# Patient Record
Sex: Female | Born: 1945
Health system: Southern US, Community
[De-identification: ages and names within clinical notes are randomized; demographics above are authoritative.]

## PROBLEM LIST (undated history)

## (undated) DIAGNOSIS — Z853 Personal history of malignant neoplasm of breast: Secondary | ICD-10-CM

## (undated) DIAGNOSIS — Z9889 Other specified postprocedural states: Secondary | ICD-10-CM

## (undated) DIAGNOSIS — K449 Diaphragmatic hernia without obstruction or gangrene: Secondary | ICD-10-CM

## (undated) DIAGNOSIS — I509 Heart failure, unspecified: Secondary | ICD-10-CM

## (undated) DIAGNOSIS — I10 Essential (primary) hypertension: Secondary | ICD-10-CM

## (undated) DIAGNOSIS — E785 Hyperlipidemia, unspecified: Secondary | ICD-10-CM

## (undated) DIAGNOSIS — T386X5A Adverse effect of antigonadotrophins, antiestrogens, antiandrogens, not elsewhere classified, initial encounter: Principal | ICD-10-CM

## (undated) DIAGNOSIS — N951 Menopausal and female climacteric states: Secondary | ICD-10-CM

## (undated) DIAGNOSIS — M818 Other osteoporosis without current pathological fracture: Secondary | ICD-10-CM

## (undated) DIAGNOSIS — M1990 Unspecified osteoarthritis, unspecified site: Secondary | ICD-10-CM

## (undated) DIAGNOSIS — I219 Acute myocardial infarction, unspecified: Secondary | ICD-10-CM

## (undated) DIAGNOSIS — K219 Gastro-esophageal reflux disease without esophagitis: Secondary | ICD-10-CM

## (undated) DIAGNOSIS — Z820 Family history of epilepsy and other diseases of the nervous system: Secondary | ICD-10-CM

## (undated) DIAGNOSIS — Z923 Personal history of irradiation: Secondary | ICD-10-CM

## (undated) DIAGNOSIS — M8580 Other specified disorders of bone density and structure, unspecified site: Secondary | ICD-10-CM

## (undated) DIAGNOSIS — C50919 Malignant neoplasm of unspecified site of unspecified female breast: Secondary | ICD-10-CM

## (undated) DIAGNOSIS — T7840XA Allergy, unspecified, initial encounter: Secondary | ICD-10-CM

## (undated) DIAGNOSIS — Z952 Presence of prosthetic heart valve: Secondary | ICD-10-CM

## (undated) HISTORY — DX: Menopausal and female climacteric states: N95.1

## (undated) HISTORY — DX: Other specified disorders of bone density and structure, unspecified site: M85.80

## (undated) HISTORY — PX: HIATAL HERNIA REPAIR: SHX195

## (undated) HISTORY — DX: Hyperlipidemia, unspecified: E78.5

## (undated) HISTORY — DX: Diaphragmatic hernia without obstruction or gangrene: K44.9

## (undated) HISTORY — DX: Heart failure, unspecified: I50.9

## (undated) HISTORY — DX: Allergy, unspecified, initial encounter: T78.40XA

## (undated) HISTORY — DX: Acute myocardial infarction, unspecified: I21.9

## (undated) HISTORY — PX: BREAST BIOPSY: SHX20

## (undated) HISTORY — PX: ABDOMINAL HYSTERECTOMY: SHX81

## (undated) HISTORY — PX: CARDIAC CATHETERIZATION: SHX172

## (undated) HISTORY — DX: Other specified postprocedural states: Z98.890

## (undated) HISTORY — PX: CARDIAC VALVE REPLACEMENT: SHX585

## (undated) HISTORY — DX: Essential (primary) hypertension: I10

## (undated) HISTORY — PX: OTHER SURGICAL HISTORY: SHX169

## (undated) HISTORY — DX: Other osteoporosis without current pathological fracture: M81.8

## (undated) HISTORY — DX: Family history of epilepsy and other diseases of the nervous system: Z82.0

## (undated) HISTORY — DX: Unspecified osteoarthritis, unspecified site: M19.90

## (undated) HISTORY — PX: EYE SURGERY: SHX253

## (undated) HISTORY — DX: Personal history of malignant neoplasm of breast: Z85.3

## (undated) HISTORY — PX: HERNIA REPAIR: SHX51

## (undated) HISTORY — DX: Presence of prosthetic heart valve: Z95.2

## (undated) HISTORY — DX: Adverse effect of antigonadotrophins, antiestrogens, antiandrogens, not elsewhere classified, initial encounter: T38.6X5A

## (undated) HISTORY — DX: Gastro-esophageal reflux disease without esophagitis: K21.9

---

## 1997-08-15 ENCOUNTER — Ambulatory Visit: Admission: RE | Admit: 1997-08-15 | Discharge: 1997-08-15 | Payer: Self-pay | Admitting: Internal Medicine

## 1998-05-07 ENCOUNTER — Other Ambulatory Visit: Admission: RE | Admit: 1998-05-07 | Discharge: 1998-05-07 | Payer: Self-pay | Admitting: Gynecology

## 1999-03-08 DIAGNOSIS — C541 Malignant neoplasm of endometrium: Secondary | ICD-10-CM | POA: Insufficient documentation

## 1999-03-08 HISTORY — PX: OTHER SURGICAL HISTORY: SHX169

## 1999-05-17 ENCOUNTER — Other Ambulatory Visit: Admission: RE | Admit: 1999-05-17 | Discharge: 1999-05-17 | Payer: Self-pay | Admitting: Gynecology

## 1999-06-08 ENCOUNTER — Other Ambulatory Visit: Admission: RE | Admit: 1999-06-08 | Discharge: 1999-06-08 | Payer: Self-pay | Admitting: Gynecology

## 1999-06-08 ENCOUNTER — Encounter (INDEPENDENT_AMBULATORY_CARE_PROVIDER_SITE_OTHER): Payer: Self-pay | Admitting: Specialist

## 1999-07-02 ENCOUNTER — Encounter: Payer: Self-pay | Admitting: Gynecologic Oncology

## 1999-07-06 ENCOUNTER — Inpatient Hospital Stay (HOSPITAL_COMMUNITY): Admission: RE | Admit: 1999-07-06 | Discharge: 1999-07-09 | Payer: Self-pay | Admitting: Gynecologic Oncology

## 1999-07-06 ENCOUNTER — Encounter (INDEPENDENT_AMBULATORY_CARE_PROVIDER_SITE_OTHER): Payer: Self-pay

## 1999-09-30 ENCOUNTER — Other Ambulatory Visit: Admission: RE | Admit: 1999-09-30 | Discharge: 1999-09-30 | Payer: Self-pay | Admitting: Gynecology

## 2000-04-24 ENCOUNTER — Other Ambulatory Visit: Admission: RE | Admit: 2000-04-24 | Discharge: 2000-04-24 | Payer: Self-pay | Admitting: Gynecology

## 2000-05-10 ENCOUNTER — Encounter: Admission: RE | Admit: 2000-05-10 | Discharge: 2000-05-10 | Payer: Self-pay | Admitting: *Deleted

## 2000-05-10 ENCOUNTER — Encounter (INDEPENDENT_AMBULATORY_CARE_PROVIDER_SITE_OTHER): Payer: Self-pay | Admitting: *Deleted

## 2000-05-10 ENCOUNTER — Other Ambulatory Visit: Admission: RE | Admit: 2000-05-10 | Discharge: 2000-05-10 | Payer: Self-pay | Admitting: *Deleted

## 2000-05-10 ENCOUNTER — Encounter: Payer: Self-pay | Admitting: *Deleted

## 2000-07-17 ENCOUNTER — Other Ambulatory Visit: Admission: RE | Admit: 2000-07-17 | Discharge: 2000-07-17 | Payer: Self-pay | Admitting: Gynecology

## 2000-12-25 ENCOUNTER — Other Ambulatory Visit: Admission: RE | Admit: 2000-12-25 | Discharge: 2000-12-25 | Payer: Self-pay | Admitting: Gynecology

## 2001-03-07 HISTORY — PX: AORTIC VALVE REPLACEMENT: SHX41

## 2001-07-02 ENCOUNTER — Other Ambulatory Visit: Admission: RE | Admit: 2001-07-02 | Discharge: 2001-07-02 | Payer: Self-pay | Admitting: Gynecology

## 2001-12-13 ENCOUNTER — Ambulatory Visit (HOSPITAL_COMMUNITY): Admission: RE | Admit: 2001-12-13 | Discharge: 2001-12-13 | Payer: Self-pay | Admitting: Cardiovascular Disease

## 2001-12-13 ENCOUNTER — Encounter: Payer: Self-pay | Admitting: Cardiovascular Disease

## 2001-12-19 ENCOUNTER — Ambulatory Visit (HOSPITAL_COMMUNITY): Admission: RE | Admit: 2001-12-19 | Discharge: 2001-12-19 | Payer: Self-pay | Admitting: Cardiovascular Disease

## 2001-12-27 ENCOUNTER — Encounter: Payer: Self-pay | Admitting: Surgery

## 2001-12-31 ENCOUNTER — Inpatient Hospital Stay (HOSPITAL_COMMUNITY): Admission: RE | Admit: 2001-12-31 | Discharge: 2002-01-05 | Payer: Self-pay | Admitting: Surgery

## 2001-12-31 ENCOUNTER — Encounter (INDEPENDENT_AMBULATORY_CARE_PROVIDER_SITE_OTHER): Payer: Self-pay | Admitting: *Deleted

## 2001-12-31 ENCOUNTER — Encounter: Payer: Self-pay | Admitting: Surgery

## 2002-01-01 ENCOUNTER — Encounter: Payer: Self-pay | Admitting: Surgery

## 2002-01-02 ENCOUNTER — Encounter: Payer: Self-pay | Admitting: Surgery

## 2002-01-24 ENCOUNTER — Other Ambulatory Visit: Admission: RE | Admit: 2002-01-24 | Discharge: 2002-01-24 | Payer: Self-pay | Admitting: Gynecology

## 2002-07-22 ENCOUNTER — Other Ambulatory Visit: Admission: RE | Admit: 2002-07-22 | Discharge: 2002-07-22 | Payer: Self-pay | Admitting: Gynecology

## 2003-01-20 ENCOUNTER — Other Ambulatory Visit: Admission: RE | Admit: 2003-01-20 | Discharge: 2003-01-20 | Payer: Self-pay | Admitting: Gynecology

## 2003-03-08 DIAGNOSIS — C50919 Malignant neoplasm of unspecified site of unspecified female breast: Secondary | ICD-10-CM

## 2003-03-08 HISTORY — DX: Malignant neoplasm of unspecified site of unspecified female breast: C50.919

## 2003-03-08 HISTORY — PX: BREAST BIOPSY: SHX20

## 2003-03-08 HISTORY — PX: BREAST LUMPECTOMY: SHX2

## 2003-06-12 ENCOUNTER — Encounter (INDEPENDENT_AMBULATORY_CARE_PROVIDER_SITE_OTHER): Payer: Self-pay | Admitting: *Deleted

## 2003-06-12 ENCOUNTER — Encounter: Admission: RE | Admit: 2003-06-12 | Discharge: 2003-06-12 | Payer: Self-pay | Admitting: Surgery

## 2003-06-18 ENCOUNTER — Encounter (HOSPITAL_COMMUNITY): Admission: RE | Admit: 2003-06-18 | Discharge: 2003-09-16 | Payer: Self-pay | Admitting: Surgery

## 2003-07-01 ENCOUNTER — Inpatient Hospital Stay (HOSPITAL_COMMUNITY): Admission: AD | Admit: 2003-07-01 | Discharge: 2003-07-07 | Payer: Self-pay | Admitting: Cardiovascular Disease

## 2003-07-03 ENCOUNTER — Encounter (INDEPENDENT_AMBULATORY_CARE_PROVIDER_SITE_OTHER): Payer: Self-pay | Admitting: *Deleted

## 2003-07-03 ENCOUNTER — Encounter: Admission: RE | Admit: 2003-07-03 | Discharge: 2003-07-03 | Payer: Self-pay | Admitting: Surgery

## 2003-07-03 DIAGNOSIS — Z853 Personal history of malignant neoplasm of breast: Secondary | ICD-10-CM

## 2003-07-03 HISTORY — DX: Personal history of malignant neoplasm of breast: Z85.3

## 2003-07-03 HISTORY — PX: OTHER SURGICAL HISTORY: SHX169

## 2003-08-01 ENCOUNTER — Ambulatory Visit: Admission: RE | Admit: 2003-08-01 | Discharge: 2003-10-30 | Payer: Self-pay | Admitting: Radiation Oncology

## 2003-08-07 ENCOUNTER — Ambulatory Visit (HOSPITAL_COMMUNITY): Admission: RE | Admit: 2003-08-07 | Discharge: 2003-08-07 | Payer: Self-pay | Admitting: Radiation Oncology

## 2003-08-25 ENCOUNTER — Other Ambulatory Visit: Admission: RE | Admit: 2003-08-25 | Discharge: 2003-08-25 | Payer: Self-pay | Admitting: Gynecology

## 2004-01-26 ENCOUNTER — Other Ambulatory Visit: Admission: RE | Admit: 2004-01-26 | Discharge: 2004-01-26 | Payer: Self-pay | Admitting: Gynecology

## 2004-02-03 ENCOUNTER — Observation Stay (HOSPITAL_COMMUNITY): Admission: EM | Admit: 2004-02-03 | Discharge: 2004-02-04 | Payer: Self-pay | Admitting: Emergency Medicine

## 2004-02-12 ENCOUNTER — Ambulatory Visit: Payer: Self-pay | Admitting: Hematology & Oncology

## 2004-05-18 ENCOUNTER — Ambulatory Visit: Payer: Self-pay | Admitting: Internal Medicine

## 2004-06-11 ENCOUNTER — Ambulatory Visit: Payer: Self-pay | Admitting: Hematology & Oncology

## 2004-08-11 ENCOUNTER — Ambulatory Visit: Payer: Self-pay | Admitting: Family Medicine

## 2004-08-27 ENCOUNTER — Other Ambulatory Visit: Admission: RE | Admit: 2004-08-27 | Discharge: 2004-08-27 | Payer: Self-pay | Admitting: Gynecology

## 2004-10-08 ENCOUNTER — Ambulatory Visit: Payer: Self-pay | Admitting: Hematology & Oncology

## 2004-11-18 ENCOUNTER — Ambulatory Visit: Payer: Self-pay | Admitting: Internal Medicine

## 2004-12-17 ENCOUNTER — Ambulatory Visit: Payer: Self-pay | Admitting: Internal Medicine

## 2005-02-04 ENCOUNTER — Ambulatory Visit: Payer: Self-pay | Admitting: Hematology & Oncology

## 2005-04-19 ENCOUNTER — Ambulatory Visit: Payer: Self-pay | Admitting: Internal Medicine

## 2005-05-17 ENCOUNTER — Ambulatory Visit: Payer: Self-pay | Admitting: Internal Medicine

## 2005-06-07 ENCOUNTER — Ambulatory Visit: Payer: Self-pay | Admitting: Hematology & Oncology

## 2005-06-08 LAB — CBC WITH DIFFERENTIAL/PLATELET
BASO%: 0.6 % (ref 0.0–2.0)
Basophils Absolute: 0 10*3/uL (ref 0.0–0.1)
EOS%: 3 % (ref 0.0–7.0)
HGB: 13.9 g/dL (ref 11.6–15.9)
MCH: 28.7 pg (ref 26.0–34.0)
MONO#: 0.4 10*3/uL (ref 0.1–0.9)
RDW: 13 % (ref 11.3–14.5)
WBC: 4 10*3/uL (ref 3.9–10.0)
lymph#: 1.1 10*3/uL (ref 0.9–3.3)

## 2005-06-08 LAB — COMPREHENSIVE METABOLIC PANEL
ALT: 32 U/L (ref 0–40)
AST: 37 U/L (ref 0–37)
Albumin: 4.3 g/dL (ref 3.5–5.2)
BUN: 14 mg/dL (ref 6–23)
Calcium: 9.3 mg/dL (ref 8.4–10.5)
Chloride: 104 mEq/L (ref 96–112)
Potassium: 3.8 mEq/L (ref 3.5–5.3)

## 2005-08-25 ENCOUNTER — Ambulatory Visit: Payer: Self-pay | Admitting: Internal Medicine

## 2005-09-19 ENCOUNTER — Ambulatory Visit: Payer: Self-pay | Admitting: Internal Medicine

## 2005-12-12 ENCOUNTER — Ambulatory Visit: Payer: Self-pay | Admitting: Hematology & Oncology

## 2006-01-23 ENCOUNTER — Ambulatory Visit: Payer: Self-pay | Admitting: Internal Medicine

## 2006-01-23 LAB — CONVERTED CEMR LAB: Cholesterol: 154 mg/dL (ref 0–200)

## 2006-03-28 ENCOUNTER — Encounter (INDEPENDENT_AMBULATORY_CARE_PROVIDER_SITE_OTHER): Payer: Self-pay | Admitting: Specialist

## 2006-03-28 ENCOUNTER — Encounter: Admission: RE | Admit: 2006-03-28 | Discharge: 2006-03-28 | Payer: Self-pay | Admitting: Surgery

## 2006-06-06 ENCOUNTER — Encounter: Payer: Self-pay | Admitting: Internal Medicine

## 2006-06-21 ENCOUNTER — Ambulatory Visit: Payer: Self-pay | Admitting: Hematology & Oncology

## 2006-06-26 LAB — COMPREHENSIVE METABOLIC PANEL
ALT: 24 U/L (ref 0–35)
AST: 24 U/L (ref 0–37)
Albumin: 4.5 g/dL (ref 3.5–5.2)
Alkaline Phosphatase: 62 U/L (ref 39–117)
Glucose, Bld: 145 mg/dL — ABNORMAL HIGH (ref 70–99)
Potassium: 3.8 mEq/L (ref 3.5–5.3)
Sodium: 141 mEq/L (ref 135–145)
Total Bilirubin: 0.4 mg/dL (ref 0.3–1.2)
Total Protein: 6.9 g/dL (ref 6.0–8.3)

## 2006-06-26 LAB — CBC WITH DIFFERENTIAL/PLATELET
BASO%: 0.3 % (ref 0.0–2.0)
EOS%: 1.6 % (ref 0.0–7.0)
MCH: 29.1 pg (ref 26.0–34.0)
MCHC: 34.3 g/dL (ref 32.0–36.0)
RDW: 13.1 % (ref 11.3–14.5)
lymph#: 1.1 10*3/uL (ref 0.9–3.3)

## 2006-07-24 ENCOUNTER — Ambulatory Visit: Payer: Self-pay | Admitting: Internal Medicine

## 2006-07-25 ENCOUNTER — Encounter: Payer: Self-pay | Admitting: Internal Medicine

## 2006-07-25 DIAGNOSIS — E785 Hyperlipidemia, unspecified: Secondary | ICD-10-CM

## 2006-07-25 DIAGNOSIS — C549 Malignant neoplasm of corpus uteri, unspecified: Secondary | ICD-10-CM

## 2006-07-25 DIAGNOSIS — I10 Essential (primary) hypertension: Secondary | ICD-10-CM | POA: Insufficient documentation

## 2006-07-25 DIAGNOSIS — G43009 Migraine without aura, not intractable, without status migrainosus: Secondary | ICD-10-CM | POA: Insufficient documentation

## 2006-09-12 ENCOUNTER — Encounter: Admission: RE | Admit: 2006-09-12 | Discharge: 2006-09-12 | Payer: Self-pay | Admitting: Surgery

## 2006-12-05 ENCOUNTER — Encounter: Payer: Self-pay | Admitting: Internal Medicine

## 2006-12-20 ENCOUNTER — Ambulatory Visit: Payer: Self-pay | Admitting: Internal Medicine

## 2006-12-22 ENCOUNTER — Encounter: Admission: RE | Admit: 2006-12-22 | Discharge: 2006-12-22 | Payer: Self-pay | Admitting: Internal Medicine

## 2006-12-28 ENCOUNTER — Ambulatory Visit: Payer: Self-pay | Admitting: Hematology & Oncology

## 2007-01-01 ENCOUNTER — Encounter: Payer: Self-pay | Admitting: Internal Medicine

## 2007-01-01 LAB — CBC WITH DIFFERENTIAL/PLATELET
EOS%: 2.5 % (ref 0.0–7.0)
Eosinophils Absolute: 0.1 10*3/uL (ref 0.0–0.5)
LYMPH%: 22.3 % (ref 14.0–48.0)
MCH: 29.2 pg (ref 26.0–34.0)
MCV: 84.3 fL (ref 81.0–101.0)
MONO%: 6.8 % (ref 0.0–13.0)
NEUT#: 3.7 10*3/uL (ref 1.5–6.5)
Platelets: 290 10*3/uL (ref 145–400)
RBC: 4.87 10*6/uL (ref 3.70–5.32)

## 2007-01-01 LAB — COMPREHENSIVE METABOLIC PANEL
AST: 23 U/L (ref 0–37)
Alkaline Phosphatase: 57 U/L (ref 39–117)
BUN: 19 mg/dL (ref 6–23)
Glucose, Bld: 52 mg/dL — ABNORMAL LOW (ref 70–99)
Sodium: 142 mEq/L (ref 135–145)
Total Bilirubin: 0.3 mg/dL (ref 0.3–1.2)
Total Protein: 6.9 g/dL (ref 6.0–8.3)

## 2007-01-04 ENCOUNTER — Ambulatory Visit: Payer: Self-pay | Admitting: Internal Medicine

## 2007-01-06 LAB — HM COLONOSCOPY

## 2007-01-11 ENCOUNTER — Ambulatory Visit: Payer: Self-pay | Admitting: Internal Medicine

## 2007-01-11 ENCOUNTER — Encounter: Payer: Self-pay | Admitting: Internal Medicine

## 2007-01-12 ENCOUNTER — Ambulatory Visit (HOSPITAL_COMMUNITY): Admission: RE | Admit: 2007-01-12 | Discharge: 2007-01-12 | Payer: Self-pay | Admitting: Internal Medicine

## 2007-01-23 ENCOUNTER — Ambulatory Visit: Payer: Self-pay | Admitting: Internal Medicine

## 2007-01-23 DIAGNOSIS — K219 Gastro-esophageal reflux disease without esophagitis: Secondary | ICD-10-CM | POA: Insufficient documentation

## 2007-01-26 ENCOUNTER — Encounter: Payer: Self-pay | Admitting: Internal Medicine

## 2007-02-20 ENCOUNTER — Encounter: Payer: Self-pay | Admitting: Internal Medicine

## 2007-03-08 HISTORY — PX: OTHER SURGICAL HISTORY: SHX169

## 2007-03-12 ENCOUNTER — Ambulatory Visit (HOSPITAL_COMMUNITY): Admission: RE | Admit: 2007-03-12 | Discharge: 2007-03-12 | Payer: Self-pay | Admitting: Internal Medicine

## 2007-03-26 ENCOUNTER — Ambulatory Visit: Payer: Self-pay | Admitting: Internal Medicine

## 2007-03-30 ENCOUNTER — Encounter: Admission: RE | Admit: 2007-03-30 | Discharge: 2007-03-30 | Payer: Self-pay | Admitting: Surgery

## 2007-05-09 ENCOUNTER — Encounter (INDEPENDENT_AMBULATORY_CARE_PROVIDER_SITE_OTHER): Payer: Self-pay | Admitting: General Surgery

## 2007-05-09 ENCOUNTER — Inpatient Hospital Stay (HOSPITAL_COMMUNITY): Admission: RE | Admit: 2007-05-09 | Discharge: 2007-05-16 | Payer: Self-pay | Admitting: General Surgery

## 2007-05-09 DIAGNOSIS — M949 Disorder of cartilage, unspecified: Secondary | ICD-10-CM

## 2007-05-09 DIAGNOSIS — M899 Disorder of bone, unspecified: Secondary | ICD-10-CM | POA: Insufficient documentation

## 2007-05-09 DIAGNOSIS — M129 Arthropathy, unspecified: Secondary | ICD-10-CM | POA: Insufficient documentation

## 2007-05-09 DIAGNOSIS — K449 Diaphragmatic hernia without obstruction or gangrene: Secondary | ICD-10-CM | POA: Insufficient documentation

## 2007-05-09 DIAGNOSIS — J309 Allergic rhinitis, unspecified: Secondary | ICD-10-CM | POA: Insufficient documentation

## 2007-05-11 HISTORY — PX: HIATAL HERNIA REPAIR: SHX195

## 2007-06-19 ENCOUNTER — Encounter: Payer: Self-pay | Admitting: Internal Medicine

## 2007-07-02 ENCOUNTER — Ambulatory Visit: Payer: Self-pay | Admitting: Hematology & Oncology

## 2007-07-04 ENCOUNTER — Encounter: Payer: Self-pay | Admitting: Internal Medicine

## 2007-07-04 LAB — CBC WITH DIFFERENTIAL/PLATELET
Basophils Absolute: 0 10*3/uL (ref 0.0–0.1)
EOS%: 2.6 % (ref 0.0–7.0)
Eosinophils Absolute: 0.1 10*3/uL (ref 0.0–0.5)
HCT: 37.4 % (ref 34.8–46.6)
HGB: 12.8 g/dL (ref 11.6–15.9)
MONO#: 0.2 10*3/uL (ref 0.1–0.9)
NEUT#: 3.4 10*3/uL (ref 1.5–6.5)
NEUT%: 68.1 % (ref 39.6–76.8)
RDW: 13.9 % (ref 11.3–14.5)
WBC: 4.9 10*3/uL (ref 3.9–10.0)
lymph#: 1.2 10*3/uL (ref 0.9–3.3)

## 2007-07-04 LAB — COMPREHENSIVE METABOLIC PANEL
AST: 24 U/L (ref 0–37)
Albumin: 4.1 g/dL (ref 3.5–5.2)
BUN: 15 mg/dL (ref 6–23)
CO2: 29 mEq/L (ref 19–32)
Calcium: 9.3 mg/dL (ref 8.4–10.5)
Chloride: 102 mEq/L (ref 96–112)
Glucose, Bld: 107 mg/dL — ABNORMAL HIGH (ref 70–99)
Potassium: 3.6 mEq/L (ref 3.5–5.3)

## 2007-07-27 ENCOUNTER — Ambulatory Visit: Payer: Self-pay | Admitting: Internal Medicine

## 2007-08-16 ENCOUNTER — Encounter: Payer: Self-pay | Admitting: Internal Medicine

## 2007-08-22 ENCOUNTER — Encounter: Payer: Self-pay | Admitting: Internal Medicine

## 2007-08-27 ENCOUNTER — Telehealth: Payer: Self-pay | Admitting: Internal Medicine

## 2007-08-29 ENCOUNTER — Ambulatory Visit: Payer: Self-pay | Admitting: Gastroenterology

## 2007-08-29 DIAGNOSIS — R1084 Generalized abdominal pain: Secondary | ICD-10-CM

## 2007-08-29 DIAGNOSIS — R197 Diarrhea, unspecified: Secondary | ICD-10-CM

## 2007-08-30 ENCOUNTER — Telehealth (INDEPENDENT_AMBULATORY_CARE_PROVIDER_SITE_OTHER): Payer: Self-pay

## 2007-08-30 LAB — CONVERTED CEMR LAB
Albumin: 4.1 g/dL (ref 3.5–5.2)
Alkaline Phosphatase: 53 units/L (ref 39–117)
BUN: 10 mg/dL (ref 6–23)
Basophils Relative: 0 % (ref 0.0–1.0)
Creatinine, Ser: 0.8 mg/dL (ref 0.4–1.2)
Eosinophils Relative: 2.7 % (ref 0.0–5.0)
GFR calc non Af Amer: 78 mL/min
Glucose, Bld: 121 mg/dL — ABNORMAL HIGH (ref 70–99)
HCT: 39.2 % (ref 36.0–46.0)
Hemoglobin: 13.5 g/dL (ref 12.0–15.0)
MCV: 83.2 fL (ref 78.0–100.0)
Monocytes Absolute: 0.3 10*3/uL (ref 0.1–1.0)
Monocytes Relative: 3.8 % (ref 3.0–12.0)
Neutro Abs: 5.1 10*3/uL (ref 1.4–7.7)
Potassium: 3.4 meq/L — ABNORMAL LOW (ref 3.5–5.1)
RBC: 4.71 M/uL (ref 3.87–5.11)
WBC: 6.7 10*3/uL (ref 4.5–10.5)

## 2007-09-03 ENCOUNTER — Encounter: Payer: Self-pay | Admitting: Physician Assistant

## 2007-09-12 ENCOUNTER — Ambulatory Visit: Payer: Self-pay | Admitting: Internal Medicine

## 2007-09-12 DIAGNOSIS — R5381 Other malaise: Secondary | ICD-10-CM

## 2007-09-12 DIAGNOSIS — R5383 Other fatigue: Secondary | ICD-10-CM | POA: Insufficient documentation

## 2007-10-17 ENCOUNTER — Encounter: Payer: Self-pay | Admitting: Internal Medicine

## 2008-01-09 ENCOUNTER — Ambulatory Visit: Payer: Self-pay | Admitting: Hematology & Oncology

## 2008-01-10 ENCOUNTER — Encounter: Payer: Self-pay | Admitting: Internal Medicine

## 2008-01-10 LAB — CBC WITH DIFFERENTIAL (CANCER CENTER ONLY)
BASO%: 0.6 % (ref 0.0–2.0)
LYMPH%: 26.5 % (ref 14.0–48.0)
MCV: 85 fL (ref 81–101)
MONO#: 0.2 10*3/uL (ref 0.1–0.9)
Platelets: 266 10*3/uL (ref 145–400)
RDW: 11.6 % (ref 10.5–14.6)
WBC: 4.9 10*3/uL (ref 3.9–10.0)

## 2008-01-10 LAB — COMPREHENSIVE METABOLIC PANEL
BUN: 22 mg/dL (ref 6–23)
CO2: 27 mEq/L (ref 19–32)
Calcium: 10.1 mg/dL (ref 8.4–10.5)
Chloride: 102 mEq/L (ref 96–112)
Creatinine, Ser: 0.95 mg/dL (ref 0.40–1.20)
Total Bilirubin: 0.4 mg/dL (ref 0.3–1.2)

## 2008-01-28 ENCOUNTER — Ambulatory Visit: Payer: Self-pay | Admitting: Internal Medicine

## 2008-02-20 ENCOUNTER — Telehealth: Payer: Self-pay | Admitting: Internal Medicine

## 2008-03-03 ENCOUNTER — Encounter: Payer: Self-pay | Admitting: Internal Medicine

## 2008-03-06 ENCOUNTER — Telehealth: Payer: Self-pay | Admitting: Internal Medicine

## 2008-03-10 ENCOUNTER — Encounter: Payer: Self-pay | Admitting: Internal Medicine

## 2008-03-13 ENCOUNTER — Ambulatory Visit: Payer: Self-pay | Admitting: Internal Medicine

## 2008-03-13 DIAGNOSIS — L509 Urticaria, unspecified: Secondary | ICD-10-CM | POA: Insufficient documentation

## 2008-03-20 ENCOUNTER — Ambulatory Visit: Payer: Self-pay | Admitting: Gastroenterology

## 2008-03-20 ENCOUNTER — Telehealth: Payer: Self-pay | Admitting: Internal Medicine

## 2008-03-20 DIAGNOSIS — R197 Diarrhea, unspecified: Secondary | ICD-10-CM | POA: Insufficient documentation

## 2008-03-20 DIAGNOSIS — A09 Infectious gastroenteritis and colitis, unspecified: Secondary | ICD-10-CM | POA: Insufficient documentation

## 2008-03-20 LAB — CONVERTED CEMR LAB
Basophils Relative: 1 % (ref 0.0–3.0)
Eosinophils Relative: 2.4 % (ref 0.0–5.0)
HCT: 40.7 % (ref 36.0–46.0)
Monocytes Absolute: 0.3 10*3/uL (ref 0.1–1.0)
Monocytes Relative: 5.4 % (ref 3.0–12.0)
Neutrophils Relative %: 69.1 % (ref 43.0–77.0)
Platelets: 297 10*3/uL (ref 150–400)
RBC: 4.76 M/uL (ref 3.87–5.11)
WBC: 6.2 10*3/uL (ref 4.5–10.5)

## 2008-03-21 ENCOUNTER — Encounter: Payer: Self-pay | Admitting: Physician Assistant

## 2008-03-24 ENCOUNTER — Telehealth: Payer: Self-pay | Admitting: Physician Assistant

## 2008-04-09 ENCOUNTER — Encounter: Admission: RE | Admit: 2008-04-09 | Discharge: 2008-04-09 | Payer: Self-pay | Admitting: Gynecology

## 2008-04-18 ENCOUNTER — Encounter: Payer: Self-pay | Admitting: Internal Medicine

## 2008-05-26 ENCOUNTER — Ambulatory Visit: Payer: Self-pay | Admitting: Internal Medicine

## 2008-07-16 ENCOUNTER — Ambulatory Visit: Payer: Self-pay | Admitting: Hematology & Oncology

## 2008-07-17 ENCOUNTER — Encounter: Payer: Self-pay | Admitting: Internal Medicine

## 2008-07-17 LAB — COMPREHENSIVE METABOLIC PANEL
ALT: 25 U/L (ref 0–35)
AST: 26 U/L (ref 0–37)
Albumin: 4.3 g/dL (ref 3.5–5.2)
Alkaline Phosphatase: 59 U/L (ref 39–117)
BUN: 17 mg/dL (ref 6–23)
CO2: 27 mEq/L (ref 19–32)
Calcium: 9 mg/dL (ref 8.4–10.5)
Chloride: 103 mEq/L (ref 96–112)
Creatinine, Ser: 0.98 mg/dL (ref 0.40–1.20)
Glucose, Bld: 133 mg/dL — ABNORMAL HIGH (ref 70–99)
Potassium: 3.5 mEq/L (ref 3.5–5.3)
Sodium: 141 mEq/L (ref 135–145)
Total Bilirubin: 0.3 mg/dL (ref 0.3–1.2)
Total Protein: 6.9 g/dL (ref 6.0–8.3)

## 2008-07-17 LAB — CBC WITH DIFFERENTIAL (CANCER CENTER ONLY)
BASO%: 0.4 % (ref 0.0–2.0)
Eosinophils Absolute: 0.1 10*3/uL (ref 0.0–0.5)
LYMPH%: 26.2 % (ref 14.0–48.0)
MCH: 28.9 pg (ref 26.0–34.0)
MCHC: 34 g/dL (ref 32.0–36.0)
MCV: 85 fL (ref 81–101)
MONO%: 3.8 % (ref 0.0–13.0)
Platelets: 249 10*3/uL (ref 145–400)
RDW: 11.6 % (ref 10.5–14.6)

## 2008-11-24 ENCOUNTER — Ambulatory Visit: Payer: Self-pay | Admitting: Internal Medicine

## 2009-01-14 ENCOUNTER — Ambulatory Visit: Payer: Self-pay | Admitting: Hematology & Oncology

## 2009-01-15 ENCOUNTER — Encounter: Payer: Self-pay | Admitting: Internal Medicine

## 2009-01-15 LAB — CBC WITH DIFFERENTIAL (CANCER CENTER ONLY)
BASO#: 0 10*3/uL (ref 0.0–0.2)
Eosinophils Absolute: 0.1 10*3/uL (ref 0.0–0.5)
HGB: 14 g/dL (ref 11.6–15.9)
LYMPH%: 28.2 % (ref 14.0–48.0)
MCH: 28.6 pg (ref 26.0–34.0)
MCV: 82 fL (ref 81–101)
MONO%: 6.3 % (ref 0.0–13.0)
NEUT%: 62.8 % (ref 39.6–80.0)
RBC: 4.9 10*6/uL (ref 3.70–5.32)

## 2009-01-16 LAB — COMPREHENSIVE METABOLIC PANEL
ALT: 27 U/L (ref 0–35)
AST: 27 U/L (ref 0–37)
Albumin: 4.4 g/dL (ref 3.5–5.2)
BUN: 23 mg/dL (ref 6–23)
CO2: 29 mEq/L (ref 19–32)
Calcium: 9.4 mg/dL (ref 8.4–10.5)
Chloride: 106 mEq/L (ref 96–112)
Creatinine, Ser: 0.9 mg/dL (ref 0.40–1.20)
Potassium: 4.5 mEq/L (ref 3.5–5.3)

## 2009-01-16 LAB — VITAMIN D 25 HYDROXY (VIT D DEFICIENCY, FRACTURES): Vit D, 25-Hydroxy: 40 ng/mL (ref 30–89)

## 2009-04-13 ENCOUNTER — Encounter: Admission: RE | Admit: 2009-04-13 | Discharge: 2009-04-13 | Payer: Self-pay | Admitting: Gynecology

## 2009-04-15 ENCOUNTER — Telehealth: Payer: Self-pay | Admitting: Internal Medicine

## 2009-05-11 ENCOUNTER — Encounter: Payer: Self-pay | Admitting: Internal Medicine

## 2009-05-18 ENCOUNTER — Ambulatory Visit: Payer: Self-pay | Admitting: Internal Medicine

## 2009-05-18 LAB — CONVERTED CEMR LAB
AST: 25 units/L (ref 0–37)
BUN: 17 mg/dL (ref 6–23)
Basophils Absolute: 0 10*3/uL (ref 0.0–0.1)
Bilirubin Urine: NEGATIVE
Blood in Urine, dipstick: NEGATIVE
Calcium: 9.8 mg/dL (ref 8.4–10.5)
Cholesterol: 161 mg/dL (ref 0–200)
Creatinine, Ser: 0.8 mg/dL (ref 0.4–1.2)
GFR calc non Af Amer: 77.18 mL/min (ref >60)
Glucose, Bld: 97 mg/dL (ref 70–99)
Glucose, Urine, Semiquant: NEGATIVE
HDL: 59 mg/dL (ref >39.00)
Lymphocytes Relative: 25 % (ref 12.0–46.0)
Lymphs Abs: 1.3 10*3/uL (ref 0.7–4.0)
Monocytes Relative: 8.7 % (ref 3.0–12.0)
Platelets: 251 10*3/uL (ref 150.0–400.0)
Protein, U semiquant: NEGATIVE
RDW: 12.4 % (ref 11.5–14.6)
TSH: 2.39 microintl units/mL (ref 0.35–5.50)
Total Bilirubin: 0.5 mg/dL (ref 0.3–1.2)
VLDL: 25 mg/dL (ref 0.0–40.0)
pH: 7

## 2009-05-25 ENCOUNTER — Ambulatory Visit: Payer: Self-pay | Admitting: Internal Medicine

## 2009-07-16 ENCOUNTER — Ambulatory Visit: Payer: Self-pay | Admitting: Hematology & Oncology

## 2009-07-20 ENCOUNTER — Encounter: Payer: Self-pay | Admitting: Internal Medicine

## 2009-07-20 LAB — CBC WITH DIFFERENTIAL (CANCER CENTER ONLY)
BASO#: 0 10*3/uL (ref 0.0–0.2)
HCT: 39.8 % (ref 34.8–46.6)
HGB: 13.6 g/dL (ref 11.6–15.9)
LYMPH#: 1.5 10*3/uL (ref 0.9–3.3)
MONO#: 0.3 10*3/uL (ref 0.1–0.9)
NEUT#: 4.2 10*3/uL (ref 1.5–6.5)
NEUT%: 70.1 % (ref 39.6–80.0)
RBC: 4.69 10*6/uL (ref 3.70–5.32)
WBC: 6 10*3/uL (ref 3.9–10.0)

## 2009-07-21 LAB — COMPREHENSIVE METABOLIC PANEL
ALT: 24 U/L (ref 0–35)
Albumin: 4.4 g/dL (ref 3.5–5.2)
BUN: 18 mg/dL (ref 6–23)
CO2: 29 mEq/L (ref 19–32)
Calcium: 9.6 mg/dL (ref 8.4–10.5)
Chloride: 102 mEq/L (ref 96–112)
Creatinine, Ser: 0.88 mg/dL (ref 0.40–1.20)
Potassium: 3.4 mEq/L — ABNORMAL LOW (ref 3.5–5.3)

## 2009-07-21 LAB — VITAMIN D 25 HYDROXY (VIT D DEFICIENCY, FRACTURES): Vit D, 25-Hydroxy: 44 ng/mL (ref 30–89)

## 2009-08-21 ENCOUNTER — Ambulatory Visit: Payer: Self-pay | Admitting: Hematology & Oncology

## 2009-08-24 LAB — COMPREHENSIVE METABOLIC PANEL
BUN: 16 mg/dL (ref 6–23)
CO2: 25 mEq/L (ref 19–32)
Calcium: 9.8 mg/dL (ref 8.4–10.5)
Chloride: 105 mEq/L (ref 96–112)
Creatinine, Ser: 0.76 mg/dL (ref 0.40–1.20)
Glucose, Bld: 124 mg/dL — ABNORMAL HIGH (ref 70–99)

## 2009-08-24 LAB — CBC WITH DIFFERENTIAL (CANCER CENTER ONLY)
BASO#: 0 10*3/uL (ref 0.0–0.2)
EOS%: 3.2 % (ref 0.0–7.0)
Eosinophils Absolute: 0.2 10*3/uL (ref 0.0–0.5)
HCT: 43.1 % (ref 34.8–46.6)
HGB: 14.6 g/dL (ref 11.6–15.9)
LYMPH#: 1.7 10*3/uL (ref 0.9–3.3)
MCHC: 34 g/dL (ref 32.0–36.0)
MONO#: 0.4 10*3/uL (ref 0.1–0.9)
NEUT%: 64.5 % (ref 39.6–80.0)

## 2009-10-29 ENCOUNTER — Ambulatory Visit: Payer: Self-pay | Admitting: Cardiovascular Disease

## 2009-11-24 ENCOUNTER — Ambulatory Visit (HOSPITAL_COMMUNITY): Admission: RE | Admit: 2009-11-24 | Discharge: 2009-11-24 | Payer: Self-pay | Admitting: Cardiovascular Disease

## 2009-11-24 ENCOUNTER — Ambulatory Visit: Payer: Self-pay | Admitting: Cardiovascular Disease

## 2009-11-24 ENCOUNTER — Encounter: Payer: Self-pay | Admitting: Internal Medicine

## 2009-11-30 ENCOUNTER — Ambulatory Visit: Payer: Self-pay | Admitting: Cardiovascular Disease

## 2009-11-30 ENCOUNTER — Encounter: Payer: Self-pay | Admitting: Internal Medicine

## 2009-12-29 ENCOUNTER — Ambulatory Visit: Payer: Self-pay | Admitting: Cardiovascular Disease

## 2010-01-15 ENCOUNTER — Ambulatory Visit: Payer: Self-pay | Admitting: Hematology & Oncology

## 2010-01-18 ENCOUNTER — Encounter: Payer: Self-pay | Admitting: Internal Medicine

## 2010-01-18 LAB — COMPREHENSIVE METABOLIC PANEL
ALT: 28 U/L (ref 0–35)
AST: 29 U/L (ref 0–37)
Albumin: 4.4 g/dL (ref 3.5–5.2)
Alkaline Phosphatase: 51 U/L (ref 39–117)
BUN: 21 mg/dL (ref 6–23)
Calcium: 9.5 mg/dL (ref 8.4–10.5)
Chloride: 104 mEq/L (ref 96–112)
Potassium: 3.7 mEq/L (ref 3.5–5.3)
Sodium: 141 mEq/L (ref 135–145)
Total Protein: 6.6 g/dL (ref 6.0–8.3)

## 2010-01-18 LAB — CBC WITH DIFFERENTIAL (CANCER CENTER ONLY)
BASO#: 0 10*3/uL (ref 0.0–0.2)
BASO%: 0.5 % (ref 0.0–2.0)
EOS%: 1.6 % (ref 0.0–7.0)
HGB: 13.7 g/dL (ref 11.6–15.9)
LYMPH#: 1.5 10*3/uL (ref 0.9–3.3)
MCH: 28.5 pg (ref 26.0–34.0)
MCHC: 33.7 g/dL (ref 32.0–36.0)
MONO%: 5.8 % (ref 0.0–13.0)
NEUT#: 4.6 10*3/uL (ref 1.5–6.5)
Platelets: 285 10*3/uL (ref 145–400)
RBC: 4.8 10*6/uL (ref 3.70–5.32)

## 2010-01-26 ENCOUNTER — Ambulatory Visit: Payer: Self-pay | Admitting: Cardiology

## 2010-01-27 ENCOUNTER — Telehealth: Payer: Self-pay | Admitting: Internal Medicine

## 2010-02-22 ENCOUNTER — Ambulatory Visit: Payer: Self-pay | Admitting: Cardiovascular Disease

## 2010-03-24 ENCOUNTER — Ambulatory Visit: Payer: Self-pay | Admitting: Cardiology

## 2010-03-27 ENCOUNTER — Other Ambulatory Visit: Payer: Self-pay | Admitting: Surgery

## 2010-03-27 DIAGNOSIS — Z9889 Other specified postprocedural states: Secondary | ICD-10-CM

## 2010-03-28 ENCOUNTER — Encounter: Payer: Self-pay | Admitting: Surgery

## 2010-04-06 NOTE — Letter (Signed)
Summary: [facility]  [facility]   Imported By: Maryln Gottron 01/27/2010 10:19:15  _____________________________________________________________________  External Attachment:    Type:   Image     Comment:   External Document

## 2010-04-06 NOTE — Progress Notes (Signed)
Summary: refill crestor  Phone Note Refill Request Message from:  Fax from Pharmacy  Refills Requested: Medication #1:  CRESTOR 10  MG TABS (ROSUVASTATIN CALCIUM) Take 1 tablet by mouth once a day [facility] Fax---[phone]  Initial call taken by: Warnell Forester,  April 15, 2009 8:45 AM    Prescriptions: CRESTOR 10  MG TABS (ROSUVASTATIN CALCIUM) Take 1 tablet by mouth once a day  #90 x 6   Entered by:   Raechel Ache, RN   Authorized by:   Gordy Savers  MD   Signed by:   Raechel Ache, RN on 04/15/2009   Method used:   Faxed to ...       [facility]* (mail-order)             ,          Ph: [phone]       Fax: [phone]   RxID:   [id number]

## 2010-04-06 NOTE — Progress Notes (Signed)
Summary: refill omeprazole  Phone Note Refill Request Message from:  Fax from Pharmacy on January 27, 2010 11:48 AM  Refills Requested: Medication #1:  PRILOSEC 20 MG CPDR Take 1 capsule by mouth once a day [facility]   Method Requested: Fax to Local Pharmacy Initial call taken by: Duard Brady LPN,  January 27, 2010 11:50 AM    Prescriptions: PRILOSEC 20 MG CPDR (OMEPRAZOLE) Take 1 capsule by mouth once a day  #90 x 1   Entered by:   Duard Brady LPN   Authorized by:   Gordy Savers  MD   Signed by:   Duard Brady LPN on 91/47/8295   Method used:   Historical   RxID:   [id number]  faxed back to [facility] - will need rov soon. kik

## 2010-04-06 NOTE — Medication Information (Signed)
Summary: Coverage Approval for Omeprazole  Coverage Approval for Omeprazole   Imported By: Maryln Gottron 05/15/2009 09:44:47  _____________________________________________________________________  External Attachment:    Type:   Image     Comment:   External Document

## 2010-04-06 NOTE — Letter (Signed)
[facility]  [facility]   Imported By: Maryln Gottron 03/16/2009 13:17:45  _____________________________________________________________________  External Attachment:    Type:   Image     Comment:   External Document

## 2010-04-06 NOTE — Letter (Signed)
Summary: [facility]   Imported By: Maryln Gottron 12/11/2009 12:54:33  _____________________________________________________________________  External Attachment:    Type:   Image     Comment:   External Document

## 2010-04-06 NOTE — Letter (Signed)
[facility]   Imported By: Maryln Gottron 08/17/2009 14:54:14  _____________________________________________________________________  External Attachment:    Type:   Image     Comment:   External Document

## 2010-04-06 NOTE — Assessment & Plan Note (Signed)
Summary: CPX//LH   Vital Signs:  Patient profile:   65 year old female Height:      61.5 inches Weight:      142 pounds BMI:     26.49 Temp:     99.2 degrees F oral  Vitals Entered By: Duard Brady LPN (May 25, 2009 1:15 PM)  Primary Care Provider:  Eleonore Chiquito MD   History of Present Illness: 65 year-old patient who is seen today for a wellness exam.  she is followed by multiple specialists, including cardiology OB/GYN general surgery oncology.  She is doing quite well.  Two status post prosthetic aortic valve repair on chronic Coumadin.  She has a history of Gastrosoft reflux disease and is status post Nissan fundoplication.  She has hypertension and dyslipidemia, which have been stable.  No concerns or complaints.  She is scheduled for cardiology follow up later today.  Allergies: 1)  ! Demerol 2)  Sulfamethoxazole (Sulfamethoxazole)  Past History:  Past Medical History: Hyperlipidemia Hypertension Breast cancer, hx of GERD Osteopenia status post aortic valve repair migraine headaches menopausal syndrome  Past Surgical History: Lumpectomy breast cancer 2005 Aortic Valve Replacement: 2003 Hysterectomy,endometrial cancer 2001 Right shoulder surgery Nissan fundoplication 2009, post-op hematoma on heparin colonoscopy 11-08  Family History: Reviewed history from 09/12/2007 and no changes required. father status post aortic valve surgery mother status post MI, age 58  One brother, history of hypercholesterolemia, osteoarthritis one sister No FH of Colon Cancer  Social History: Reviewed history from 09/12/2007 and no changes required. married, Patient has never smoked.  Alcohol Use - socially Occupation: Retired Illicit Drug Use - no Patient gets regular exercise.  Review of Systems  The patient denies anorexia, fever, weight loss, weight gain, vision loss, decreased hearing, hoarseness, chest pain, syncope, dyspnea on exertion, peripheral  edema, prolonged cough, headaches, hemoptysis, abdominal pain, melena, hematochezia, severe indigestion/heartburn, hematuria, incontinence, genital sores, muscle weakness, suspicious skin lesions, transient blindness, difficulty walking, depression, unusual weight change, abnormal bleeding, enlarged lymph nodes, angioedema, and breast masses.    Physical Exam  General:  overweight-appearing.  116/78overweight-appearing.   Head:  Normocephalic and atraumatic without obvious abnormalities. No apparent alopecia or balding. Eyes:  No corneal or conjunctival inflammation noted. EOMI. Perrla. Funduscopic exam benign, without hemorrhages, exudates or papilledema. Vision grossly normal. Ears:  External ear exam shows no significant lesions or deformities.  Otoscopic examination reveals clear canals, tympanic membranes are intact bilaterally without bulging, retraction, inflammation or discharge. Hearing is grossly normal bilaterally. Nose:  External nasal examination shows no deformity or inflammation. Nasal mucosa are pink and moist without lesions or exudates. Mouth:  Oral mucosa and oropharynx without lesions or exudates.  Teeth in good repair. Neck:  No deformities, masses, or tenderness noted. Chest Wall:  No deformities, masses, or tenderness noted. Lungs:  Normal respiratory effort, chest expands symmetrically. Lungs are clear to auscultation, no crackles or wheezes. Heart:  grade 2/6 systolic murmur prosthetic heart sounds rhythm regular Abdomen:  Bowel sounds positive,abdomen soft and non-tender without masses, organomegaly or hernias noted. Msk:  No deformity or scoliosis noted of thoracic or lumbar spine.   Pulses:  R and L carotid,radial,femoral,dorsalis pedis and posterior tibial pulses are full and equal bilaterally Extremities:  No clubbing, cyanosis, edema, or deformity noted with normal full range of motion of all joints.   Neurologic:  alert & oriented X3, cranial nerves II-XII intact,  gait normal, and DTRs symmetrical and normal.  alert & oriented X3, cranial nerves II-XII intact, gait normal,  and DTRs symmetrical and normal.   Skin:  Intact without suspicious lesions or rashes Cervical Nodes:  No lymphadenopathy noted Psych:  Cognition and judgment appear intact. Alert and cooperative with normal attention span and concentration. No apparent delusions, illusions, hallucinations   Impression & Recommendations:  Problem # 1:  OSTEOPENIA (ICD-733.90)  Her updated medication list for this problem includes:    Fosamax 70 Mg Tabs (Alendronate sodium) .Marland Kitchen... 1 q week  Her updated medication list for this problem includes:    Fosamax 70 Mg Tabs (Alendronate sodium) .Marland Kitchen... 1 q week  Problem # 2:  GERD (ICD-530.81)  Her updated medication list for this problem includes:    Prilosec 20 Mg Cpdr (Omeprazole) .Marland Kitchen... Take 1 capsule by mouth once a day  Her updated medication list for this problem includes:    Prilosec 20 Mg Cpdr (Omeprazole) .Marland Kitchen... Take 1 capsule by mouth once a day  Problem # 3:  HYPERTENSION (ICD-401.9)  Her updated medication list for this problem includes:    Chlorthalidone 25 Mg Tabs (Chlorthalidone) .Marland Kitchen... Take 1 tablet by mouth once a day four days per week    Clonidine Hcl 0.1 Mg Tabs (Clonidine hcl) .Marland Kitchen... Take 1 tablet by mouth every morning    Verapamil Hcl Cr 180 Mg Tbcr (Verapamil hcl) .Marland Kitchen... 1 once daily  Her updated medication list for this problem includes:    Chlorthalidone 25 Mg Tabs (Chlorthalidone) .Marland Kitchen... Take 1 tablet by mouth once a day four days per week    Clonidine Hcl 0.1 Mg Tabs (Clonidine hcl) .Marland Kitchen... Take 1 tablet by mouth every morning    Verapamil Hcl Cr 180 Mg Tbcr (Verapamil hcl) .Marland Kitchen... 1 once daily  Problem # 4:  HYPERLIPIDEMIA (ICD-272.4)  Complete Medication List: 1)  Chlorthalidone 25 Mg Tabs (Chlorthalidone) .... Take 1 tablet by mouth once a day four days per week 2)  Clonidine Hcl 0.1 Mg Tabs (Clonidine hcl) .... Take 1  tablet by mouth every morning 3)  Crestor 10 Mg Tabs (rosuvastatin Calcium)  .... Take 1 tablet by mouth once a day 4)  Femara 2.5 Mg Tabs (Letrozole) .... Take 1 once a day 5)  Klor-con M20 20 Meq Tbcr (Potassium chloride crys cr) .... Take 1 tablet by mouth once a day four days per week 6)  Prilosec 20 Mg Cpdr (Omeprazole) .... Take 1 capsule by mouth once a day 7)  Senokot 8.6 Mg Tabs (Sennosides) .... Take 2 tablets by mouth once daily 8)  Restoril 30 Mg Caps (Temazepam) .Marland Kitchen.. 1 at bedtime 9)  Fosamax 70 Mg Tabs (Alendronate sodium) .Marland Kitchen.. 1 q week 10)  Warfarin Sodium 5 Mg Tabs (Warfarin sodium) .... As dir 11)  Verapamil Hcl Cr 180 Mg Tbcr (Verapamil hcl) .Marland Kitchen.. 1 once daily 12)  Imitrex 25 Mg Tabs (Sumatriptan succinate) .... One by mouth as needed 13)  Glucosamine-chondroitin Tabs (Glucosamine-chondroit-vit c-mn) .... Take 1 tablet by mouth once a day 14)  Calcium 600/vitamin D 600-400 Mg-unit Tabs (Calcium carbonate-vitamin d) .... Take 1 tablet by mouth two times a day 15)  Flax Seed Oil 1000 Mg Caps (Flaxseed (linseed)) .... Take 1 tablet by mouth once a day 16)  Multivitamins Tabs (Multiple vitamin) .... Take 1 tablet by mouth once a day 17)  Claritin 10 Mg Tabs (Loratadine) .... Take 1 tablet by mouth once a day 18)  Restasis 0.05 % Emul (Cyclosporine) .Marland Kitchen.. 1 drop into each eye 3 times daily  Patient Instructions: 1)  Please schedule a follow-up appointment  in 1 year. 2)  Limit your Sodium (Salt). 3)  It is important that you exercise regularly at least 20 minutes 5 times a week. If you develop chest pain, have severe difficulty breathing, or feel very tired , stop exercising immediately and seek medical attention. 4)  Take calcium +Vitamin D daily. 5)  Check your Blood Pressure regularly. If it is above:  150/90 you should make an appointment. Prescriptions: VERAPAMIL HCL CR 180 MG  TBCR (VERAPAMIL HCL) 1 once daily  #90 x 6   Entered and Authorized by:   Gordy Savers  MD    Signed by:   Gordy Savers  MD on 05/25/2009   Method used:   Print then Give to Patient   RxID:   [id number] WARFARIN SODIUM 5 MG  TABS (WARFARIN SODIUM) as dir  #90 x 0   Entered and Authorized by:   Gordy Savers  MD   Signed by:   Gordy Savers  MD on 05/25/2009   Method used:   Print then Give to Patient   RxID:   [id number] FOSAMAX 70 MG  TABS (ALENDRONATE SODIUM) 1 q week  #12 x 6   Entered and Authorized by:   Gordy Savers  MD   Signed by:   Gordy Savers  MD on 05/25/2009   Method used:   Print then Give to Patient   RxID:   [id number] RESTORIL 30 MG  CAPS (TEMAZEPAM) 1 at bedtime  #30 x 4   Entered and Authorized by:   Gordy Savers  MD   Signed by:   Gordy Savers  MD on 05/25/2009   Method used:   Print then Give to Patient   RxID:   [id number] PRILOSEC 20 MG CPDR (OMEPRAZOLE) Take 1 capsule by mouth once a day  #90 x 6   Entered and Authorized by:   Gordy Savers  MD   Signed by:   Gordy Savers  MD on 05/25/2009   Method used:   Print then Give to Patient   RxID:   [id number] KLOR-CON M20 20 MEQ TBCR (POTASSIUM CHLORIDE CRYS CR) Take 1 tablet by mouth once a day four days per week  #90 x 6   Entered and Authorized by:   Gordy Savers  MD   Signed by:   Gordy Savers  MD on 05/25/2009   Method used:   Print then Give to Patient   RxID:   [id number] FEMARA 2.5 MG TABS (LETROZOLE) Take 1 once a day  #90 x 6   Entered and Authorized by:   Gordy Savers  MD   Signed by:   Gordy Savers  MD on 05/25/2009   Method used:   Print then Give to Patient   RxID:   [id number] CRESTOR 10  MG TABS (ROSUVASTATIN CALCIUM) Take 1 tablet by mouth once a day  #90 x 6   Entered and Authorized by:   Gordy Savers  MD   Signed by:   Gordy Savers  MD on 05/25/2009   Method used:   Print then Give to Patient   RxID:   [id number] CLONIDINE HCL 0.1 MG  TABS (CLONIDINE HCL) Take 1 tablet by mouth every morning  #90 x 6   Entered and Authorized by:   Gordy Savers  MD   Signed by:   Gordy Savers  MD on 05/25/2009   Method used:  Print then Give to Patient   RxID:   [id number] CHLORTHALIDONE 25 MG TABS (CHLORTHALIDONE) Take 1 tablet by mouth once a day four days per week  #90 x 6   Entered and Authorized by:   Gordy Savers  MD   Signed by:   Gordy Savers  MD on 05/25/2009   Method used:   Print then Give to Patient   RxID:   [id number]  And a half of a will L. today and will is to call and we and per day, and 00 and a or a in her with a a and one in the low.  This is a the caffeine.  A with anion Chrissie Noa was less than

## 2010-04-06 NOTE — Letter (Signed)
Summary: [facility]   Imported By: Maryln Gottron 06/01/2009 09:59:55  _____________________________________________________________________  External Attachment:    Type:   Image     Comment:   External Document

## 2010-04-14 ENCOUNTER — Other Ambulatory Visit: Payer: Self-pay | Admitting: Internal Medicine

## 2010-04-14 DIAGNOSIS — I10 Essential (primary) hypertension: Secondary | ICD-10-CM

## 2010-04-19 ENCOUNTER — Encounter (INDEPENDENT_AMBULATORY_CARE_PROVIDER_SITE_OTHER): Payer: BC Managed Care – PPO

## 2010-04-19 ENCOUNTER — Ambulatory Visit
Admission: RE | Admit: 2010-04-19 | Discharge: 2010-04-19 | Disposition: A | Discharge status: Home / Self Care | Payer: BC Managed Care – PPO | Source: Ambulatory Visit | Attending: Surgery | Admitting: Surgery

## 2010-04-19 DIAGNOSIS — Z7901 Long term (current) use of anticoagulants: Secondary | ICD-10-CM

## 2010-04-19 DIAGNOSIS — I359 Nonrheumatic aortic valve disorder, unspecified: Secondary | ICD-10-CM

## 2010-04-19 DIAGNOSIS — Z9889 Other specified postprocedural states: Secondary | ICD-10-CM

## 2010-05-19 ENCOUNTER — Other Ambulatory Visit: Payer: BC Managed Care – PPO | Admitting: Internal Medicine

## 2010-05-19 DIAGNOSIS — Z0000 Encounter for general adult medical examination without abnormal findings: Secondary | ICD-10-CM

## 2010-05-19 LAB — HEPATIC FUNCTION PANEL
ALT: 26 U/L (ref 0–35)
AST: 29 U/L (ref 0–37)
Bilirubin, Direct: 0.1 mg/dL (ref 0.0–0.3)
Total Bilirubin: 0.5 mg/dL (ref 0.3–1.2)

## 2010-05-19 LAB — POCT URINALYSIS DIPSTICK
Blood, UA: NEGATIVE
Protein, UA: NEGATIVE
Spec Grav, UA: 1.02
Urobilinogen, UA: 0.2

## 2010-05-19 LAB — CBC WITH DIFFERENTIAL/PLATELET
Basophils Absolute: 0 10*3/uL (ref 0.0–0.1)
Eosinophils Relative: 2.2 % (ref 0.0–5.0)
HCT: 42.2 % (ref 36.0–46.0)
Lymphs Abs: 1.5 10*3/uL (ref 0.7–4.0)
Monocytes Relative: 8.2 % (ref 3.0–12.0)
Neutrophils Relative %: 63.2 % (ref 43.0–77.0)
Platelets: 277 10*3/uL (ref 150.0–400.0)
RDW: 13.3 % (ref 11.5–14.6)
WBC: 5.8 10*3/uL (ref 4.5–10.5)

## 2010-05-19 LAB — BASIC METABOLIC PANEL
BUN: 23 mg/dL (ref 6–23)
Calcium: 9.3 mg/dL (ref 8.4–10.5)
Creatinine, Ser: 0.9 mg/dL (ref 0.4–1.2)
GFR: 71.73 mL/min (ref >60.00)
Glucose, Bld: 98 mg/dL (ref 70–99)
Potassium: 3.8 mEq/L (ref 3.5–5.1)

## 2010-05-19 LAB — LIPID PANEL
Cholesterol: 190 mg/dL (ref 0–200)
LDL Cholesterol: 102 mg/dL — ABNORMAL HIGH (ref 0–99)
Total CHOL/HDL Ratio: 4
Triglycerides: 180 mg/dL — ABNORMAL HIGH (ref 0.0–149.0)
VLDL: 36 mg/dL (ref 0.0–40.0)

## 2010-05-26 ENCOUNTER — Encounter: Payer: Self-pay | Admitting: Internal Medicine

## 2010-05-26 ENCOUNTER — Encounter: Payer: BC Managed Care – PPO | Admitting: *Deleted

## 2010-05-28 ENCOUNTER — Other Ambulatory Visit: Payer: Self-pay | Admitting: Internal Medicine

## 2010-05-31 ENCOUNTER — Encounter: Payer: Self-pay | Admitting: Internal Medicine

## 2010-06-01 ENCOUNTER — Encounter: Payer: Self-pay | Admitting: Internal Medicine

## 2010-06-01 ENCOUNTER — Ambulatory Visit (INDEPENDENT_AMBULATORY_CARE_PROVIDER_SITE_OTHER): Payer: BC Managed Care – PPO | Admitting: *Deleted

## 2010-06-01 ENCOUNTER — Ambulatory Visit (INDEPENDENT_AMBULATORY_CARE_PROVIDER_SITE_OTHER): Payer: BC Managed Care – PPO | Admitting: Internal Medicine

## 2010-06-01 VITALS — BP 122/80 | HR 90 | Temp 98.4°F | Resp 16 | Ht 61.5 in | Wt 141.0 lb

## 2010-06-01 DIAGNOSIS — I359 Nonrheumatic aortic valve disorder, unspecified: Secondary | ICD-10-CM

## 2010-06-01 DIAGNOSIS — Z0000 Encounter for general adult medical examination without abnormal findings: Secondary | ICD-10-CM

## 2010-06-01 DIAGNOSIS — Z7901 Long term (current) use of anticoagulants: Secondary | ICD-10-CM

## 2010-06-01 MED ORDER — POTASSIUM CHLORIDE CRYS ER 20 MEQ PO TBCR: 20.0000 meq | EXTENDED_RELEASE_TABLET | Freq: Every day | ORAL | Status: DC

## 2010-06-01 MED ORDER — TEMAZEPAM 30 MG PO CAPS: 30.0000 mg | ORAL_CAPSULE | Freq: Every evening | ORAL | Status: DC | PRN

## 2010-06-01 MED ORDER — SUMATRIPTAN SUCCINATE 25 MG PO TABS: 25.0000 mg | ORAL_TABLET | Freq: Every day | ORAL | Status: DC | PRN

## 2010-06-01 NOTE — Progress Notes (Signed)
Subjective:    Patient ID: Amanda Wiggins, female    DOB: 02/05/1946, 65 y.o.   MRN: [id number]  HPI   A 65 year old patient who is seen today for a health maintenance examination. She is followed by multiple consultants including cardiology oncology OB/GYN general surgery and ENT. She is doing reasonably well. She is now  Approximately  8 years out from aortic valve replacement surgery.   Vitals Entered By: Duard Brady LPN (May 25, 2009 1:15 PM)  Primary Care Provider: Eleonore Chiquito MD  History of Present Illness:  65 year-old patient who is seen today for a wellness exam. she is followed by multiple specialists, including cardiology OB/GYN general surgery oncology. She is doing quite well. Two status post prosthetic aortic valve repair on chronic Coumadin. She has a history of Gastrosoft reflux disease and is status post Nissan fundoplication. She has hypertension and dyslipidemia, which have been stable. No concerns or complaints. She is scheduled for cardiology follow up later today.  Allergies:  1) ! Demerol  2) Sulfamethoxazole (Sulfamethoxazole)  Past History:  Past Medical History:  Hyperlipidemia  Hypertension  Breast cancer, hx of  GERD  Osteopenia  status post aortic valve repair  migraine headaches  menopausal syndrome  Past Surgical History:  Lumpectomy breast cancer 2005  Aortic Valve Replacement: 2003  Hysterectomy,endometrial cancer 2001  Right shoulder surgery  Nissan fundoplication 2009, post-op hematoma on heparin  colonoscopy 11-08  Family History:  Reviewed history from 09/12/2007 and no changes required.  father status post aortic valve surgery  mother status post MI, age 40  One brother, history of hypercholesterolemia, osteoarthritis  one sister  No FH of Colon Cancer  Social History:  Reviewed history from 09/12/2007 and no changes required.  married,  Patient has never smoked.  Alcohol Use - socially  Occupation: Retired  Illicit  Drug Use - no  Patient gets regular exercise.   Past Medical History  Diagnosis Date  . Hyperlipidemia   . Hypertension   . Hx of breast cancer   . GERD (gastroesophageal reflux disease)   . Osteopenia   . Status post aortic valve repair   . FHx: migraine headaches   . Menopausal syndrome    Past Surgical History  Procedure Date  . Lummpectomy breast cancer 2005  . Aortic valve replacement 2003  . Hysterectomy, endometiral cancer 2001  . Right shouler surgery   . Nissan fundoplicaiton 2009    post-op hematoma on heparin     reports that she has never smoked. She has never used smokeless tobacco. She reports that she drinks alcohol. She reports that she does not use illicit drugs. family history includes Heart attack (age of onset:59) in her mother and Osteoarthritis in her brother. Allergies  Allergen Reactions  . Meperidine Hcl   . Sulfamethoxazole     REACTION: unspecified    the in in in in a a   Review of Systems  Constitutional: Negative.  Negative for fever, appetite change, fatigue and unexpected weight change.  HENT: Negative for hearing loss, ear pain, nosebleeds, congestion, sore throat, rhinorrhea, mouth sores, trouble swallowing, neck stiffness, dental problem, voice change, sinus pressure and tinnitus.   Eyes: Negative for photophobia, pain, discharge, redness and visual disturbance.  Respiratory: Negative for cough, chest tightness and shortness of breath.   Cardiovascular: Negative for chest pain, palpitations and leg swelling.  Gastrointestinal: Negative for nausea, vomiting, abdominal pain, diarrhea, constipation, blood in stool, abdominal distention and rectal pain.  Genitourinary: Negative for  dysuria, urgency, frequency, hematuria, flank pain, vaginal bleeding, vaginal discharge, difficulty urinating, genital sores, vaginal pain, menstrual problem and pelvic pain.  Musculoskeletal: Negative for back pain, joint swelling, arthralgias and gait problem.    Skin: Negative for rash.  Neurological: Negative for dizziness, syncope, speech difficulty, weakness, light-headedness, numbness and headaches.  Hematological: Negative for adenopathy. Does not bruise/bleed easily.  Psychiatric/Behavioral: Negative for suicidal ideas, behavioral problems, self-injury, dysphoric mood and agitation. The patient is not nervous/anxious.        Objective:   Physical Exam  Constitutional: She is oriented to person, place, and time. She appears well-developed and well-nourished.  HENT:  Head: Normocephalic and atraumatic.  Right Ear: External ear normal.  Left Ear: External ear normal.  Mouth/Throat: Oropharynx is clear and moist.  Eyes: Conjunctivae and EOM are normal.  Neck: Normal range of motion. Neck supple. No JVD present. No thyromegaly present.  Cardiovascular: Normal rate, regular rhythm and intact distal pulses.   No murmur heard.       Prosthetic heart sounds. Sternotomy scar. Grade 1/6 systolic murmur  Pulmonary/Chest: Effort normal and breath sounds normal. She has no wheezes. She has no rales.  Abdominal: Soft. Bowel sounds are normal. She exhibits no distension and no mass. There is no tenderness. There is no rebound and no guarding.  Genitourinary: Vagina normal.  Musculoskeletal: Normal range of motion. She exhibits no edema and no tenderness.  Neurological: She is alert and oriented to person, place, and time. She has normal reflexes. No cranial nerve deficit. She exhibits normal muscle tone. Coordination normal.  Skin: Skin is warm and dry. No rash noted.  Psychiatric: She has a normal mood and affect. Her behavior is normal.          Assessment & Plan:   annual clinical examination. We'll continue follow up with cardiology oncology OB/GYN. A mammogram was performed last month. She is for redo colonoscopy in approximately 7 years. Laboratory studies were reviewed

## 2010-06-01 NOTE — Patient Instructions (Signed)
Limit your sodium (Salt) intake    It is important that you exercise regularly, at least 20 minutes 3 to 4 times per week.  If you develop chest pain or shortness of breath seek  medical attention.  Avoids foods high in acid such as tomatoes citrus juices, and spicy foods.  Avoid eating within two hours of lying down or before exercising.  Do not overheat.  Try smaller more frequent meals.  If symptoms persist, elevate the head of her bed 12 inches while sleeping.  Take a calcium supplement, plus [phone] units of vitamin D  Return in one year for follow-up

## 2010-06-16 ENCOUNTER — Other Ambulatory Visit: Payer: Self-pay | Admitting: Internal Medicine

## 2010-06-25 ENCOUNTER — Ambulatory Visit (INDEPENDENT_AMBULATORY_CARE_PROVIDER_SITE_OTHER): Payer: BC Managed Care – PPO | Admitting: *Deleted

## 2010-06-25 DIAGNOSIS — Z7901 Long term (current) use of anticoagulants: Secondary | ICD-10-CM

## 2010-06-25 DIAGNOSIS — Z954 Presence of other heart-valve replacement: Secondary | ICD-10-CM

## 2010-06-25 DIAGNOSIS — I359 Nonrheumatic aortic valve disorder, unspecified: Secondary | ICD-10-CM

## 2010-07-05 ENCOUNTER — Encounter: Payer: BC Managed Care – PPO | Admitting: *Deleted

## 2010-07-06 ENCOUNTER — Encounter: Payer: BC Managed Care – PPO | Admitting: *Deleted

## 2010-07-12 ENCOUNTER — Other Ambulatory Visit: Payer: Self-pay | Admitting: Internal Medicine

## 2010-07-20 NOTE — H&P (Signed)
Amanda Wiggins, Amanda Wiggins               ACCOUNT NO.:  [id number]   MEDICAL RECORD NO.:  [id number]          PATIENT TYPE:  OIB   LOCATION:  1444                         FACILITY:  [facility]   PHYSICIAN:  Adolph Pollack, M.D.DATE OF BIRTH:  15-Jul-1945   DATE OF ADMISSION:  05/09/2007  DATE OF DISCHARGE:                              HISTORY & PHYSICAL   REASON FOR ADMISSION:  Repair of large symptomatic hiatal hernia.   HISTORY OF PRESENT ILLNESS:  This is a 65 year old female who is having  problems with pressure-type substernal chest discomfort, specifically  after eating.  She has had cardiac causes ruled out and was noted to  have a large hiatal hernia on a chest x-ray.  She was sent to Dr. Stan Head who did upper GI demonstrating a large hiatal hernia with at  least 1/3 of the stomach into the chest.  No lesions on upper endoscopy.  She has no dysphasia.  Manometry looked relatively normal.  She now  presents for laparoscopic hiatal hernia repair and Nissen  fundoplication.   PAST MEDICAL HISTORY:  1. Bicuspid aortic valve status post replacement.  2. Chronic anticoagulated state.  3. Left breast cancer.  4. Endometrial cancer.  5. Migraine headaches.  6. Dyslipidemia.  7. Osteoporosis.  8. Right shoulder injury.   PREVIOUS OPERATIONS:  1. Hysterectomy.  2. Aortic valve replacement with mechanical valve.  3. Breast lumpectomy.  4. Repair of left shoulder injury.   ALLERGIES:  1. SULFA.  2. DEMEROL.  3. PERCOCET.   CURRENT MEDICATIONS INCLUDE:  Verapamil for migraines, Chlorthalidone,  Prilosec, Crestor, Femara daily, clonidine p.r.n. hot flashes, Coumadin,  Restoril, glucosamine, calcium with D, potassium chloride, flax seed  oil, multivitamins, Claritin, stool softener, Restasis, Imitrex p.r.n.  migraines, Darvocet p.r.n. pain, Reglan p.r.n., Fosamax every Sunday.   SOCIAL HISTORY:  She is married and a retired Engineer, site.  Social  alcohol user but no tobacco  use.   FAMILY HISTORY:  Is notable for:  1. Aortic valvular disease.  2. As well as coronary artery disease.  3. Hypertension.  4. Diabetes.  5. Hypercholesterolemia.  6. Prostate cancer.   PHYSICAL EXAM:  GENERALLY: Well-developed, well-nourished female.  She  is no acute distress and pleasant and cooperative.  EYES:  Extraocular motions intact.  There is no icterus.  NECK:  Is supple without masses or thyroid enlargement.  CHEST:  Well-healed midsternal scar.  Breath sounds equal and clear.  CARDIOVASCULAR: Regular rate, regular rhythm, with crisp valve click  noted.  ABDOMEN:  Is soft, nontender, nondistended.  There is a well-healed  lower midline scar without evidence of hernia.  MUSCULOSKELETAL:  SCDs on her legs.  Good range of motion.  SKIN:  No jaundice.   IMPRESSION:  Symptomatic large hiatal hernia.   PLAN:  We have had Dr. Elease Hashimoto bridge her anticoagulation and she has had  a Lovenox shot. Will proceed wit a laparoscopic hiatal hernia repair.  Postoperatively, will try to wait at least 24 hours before starting  heparin and have Dr. Elease Hashimoto assist [place] with her anticoagulation while in  the hospital.  Adolph Pollack, M.D.  Electronically Signed     TJR/MEDQ  D:  05/09/2007  T:  05/09/2007  Job:  [id number]

## 2010-07-20 NOTE — Assessment & Plan Note (Signed)
[facility]                         GASTROENTEROLOGY OFFICE NOTE   NAME:Amanda Wiggins, Amanda Wiggins                      MRN:          [id number]  DATE:12/20/2006                            DOB:          02-05-1946    REFERRING PHYSICIAN:  [name], M.D.   CHIEF COMPLAINT:  Chest pain, hiatal hernia.   PRIMARY CARE PHYSICIAN:  Gordy Savers, M.D.   ASSESSMENT:  Intermittent chest pain, perhaps some dysphagia but  probably just chest pain problems which I suspect are related to her  large hiatal hernia.  Numerous physicians have told her based upon chest  x-ray that she has a large hiatus hernia.  I think this is probably  symptomatic and may need surgery.  She takes Coumadin for an aortic  valve.  She would be at high risk of endoscopic investigations, but that  is certainly not insurmountable.   PLAN:  1. Upper GI series.  2. Will probably need an upper GI endoscopy.  3. I think it would be reasonable for her to have a screening optical      colonoscopy, as she has had just a barium enema in the past (though      that is not imperative at this time).  4. Will work with Dr. Elease Hashimoto regarding Coumadin therapy.  Aortic      valves, from my understanding, are typically a lower risk valve,      though if I am not going to dilate, I can perform an upper      endoscopy while she remains on her Coumadin and still take      biopsies.   HISTORY:  This 65 year old white woman for 2 years has had problems with  mid-esophageal or mid-sternal and lower sternal pain intermittently,  often while eating.  She often feels full in her esophagus but does not  really describe impact dysphagia.  A few weeks ago she had a very  disabling spell where she was ill for 2 hours.  It usually spontaneously  remits.  She does not seem to vomit or regurgitate.  She has not lost  weight.  She had been on Actonel for 2 years and stopped that for  several months at one  point, but that did not make a difference.  She is  on a PPI, i.e., Prilosec 20 mg every morning.  That and other PPIs have  not made a difference either.  It is an episodic problem, but it has  caused quite a bit of distress when it occurs.   PAST MEDICAL HISTORY:  1. Dyslipidemia.  2. Right shoulder and elbow trauma from fall.  3. Endometrial and breast cancer.  4. Breast lumpectomy with subsequently irradiation in 2005.  Dr.      Jamey Ripa was her surgery.  5. Hysterectomy for endometrial cancer in May of 2001.  6. Aortic valve replacement for a congenital bicuspid valve which runs      in her family, October of 2003, Dr. Laneta Simmers.  7. Migraine headaches.  8. Osteoporosis.   MEDICATIONS:  Listed and reviewed in the chart, but are:  1. Verapamil 180 mg daily.  2. Chlorthalidone 25 mg every other day.  3. Coumadin per schedule.  4. Prilosec 20 mg every morning.  5. Crestor 10 mg every morning.  6. Glucosamine chondroitin each morning.  7. Calcium 600 plus D twice a day.  8. Potassium 20 mEq every other day.  9. Flax seed oil daily.  10.Multivitamin daily.  11.Claritin OTC daily.  12.Stool softener, 2 every evening.  13.Femara 2.5 mg daily.  14.Clonidine 0.1 mg daily.  15.Restasis eye drops b.i.d.  16.Actonel weekly.  17.Restoril p.r.n.  18.Imitrex p.r.n.  19.Darvocet p.r.n.  20.Reglan p.r.n.   ALLERGIES:  SULFA CAUSES NAUSEA.   FAMILY HISTORY:  Bicuspid aortic valve is noted in several family  members.  Coronary disease in her mother.  Prostate cancer in her dad.  No colon cancer reported.   SOCIAL HISTORY:  She is married.  She is a retired Freight forwarder.  She lives with her husband, no children.  She has a  Manufacturing engineer and other post-graduate education.  Social alcohol only.   REVIEW OF SYSTEMS:  See medical history form otherwise.   PHYSICAL EXAMINATION:  GENERAL:  Well-developed, well-nourished white  woman.  VITAL SIGNS:  Height 5  feet 1 inches, weight 140 pounds (she does [employer]).  Blood pressure 138/64, pulse 68.  HEENT:  Eyes:  Anicteric.  ENT:  Normal mouth, throat, esophagus.  NECK:  Supple without thyromegaly.  CHEST:  Clear.  HEART:  S1 and S2.  No rubs or gallops.  There is a metallic S2, perhaps  a faint systolic murmur heard.  ABDOMEN:  Multiple scars but is soft and nontender without organomegaly  or mass.  RECTAL:  Not performed today.  LYMPHATICS:  No neck or supraclavicular nodes.  EXTREMITIES:  No edema.  SKIN:  No rash.  PSYCHIATRIC:  Alert and oriented x3.  NEUROLOGIC:  Grossly nonfocal.   I have reviewed the office notes from Dr. Elease Hashimoto.  Her INR was 3.3 at  that time.  She does relate that when she was holding her Coumadin prior  to her breast surgery, she had to go on heparin because her INR became  normal at one point.  If we needed to do anything like that, perhaps a  Lovenox window would work better for her, but we will sort through it.   I have requested copies of the CT scan she had recently at [facility].     Iva Boop, MD,FACG  Electronically Signed    CEG/MedQ  DD: 12/20/2006  DT: 12/21/2006  Job #: [id number]   cc:   [name], M.D.  Gordy Savers, MD

## 2010-07-20 NOTE — Consult Note (Signed)
NAMECRESTA, RIDEN               ACCOUNT NO.:  [id number]   MEDICAL RECORD NO.:  [id number]          PATIENT TYPE:  INP   LOCATION:  1444                         FACILITY:  [facility]   PHYSICIAN:  [name], M.D. DATE OF BIRTH:  04/13/1945   DATE OF CONSULTATION:  05/11/2007  DATE OF DISCHARGE:                                 CONSULTATION   HISTORY:  Amanda Wiggins is a 65 year old female with a history of aortic  valve replacement.  She developed a large hiatal hernia and was referred  for further evaluation.  She was seen preoperatively and was cleared for  surgery.   She had her surgery on May 09, 2007.  This morning she developed an  expanding ecchymotic area and also some tachycardia.  Her hemoglobin has  also dropped and it was thought that she had had some bleeding.  We are  asked to see her today for her episodes of tachycardia and to further  comment on her anticoagulation.   [name] has done well with her aortic valve.  She has been therapeutic on  her Coumadin and has not had any complications.  She has had to stop her  Coumadin several times for various tests and she has not had any  complications with that.  She stopped her Coumadin 4 days prior to her  surgery.  We bridged her with Lovenox for a day and she did not have any  complications.   She had a very large repair of a hiatal hernia.  This morning she was  found to have a heart rate in the 115-120 range.  She was found to have  an expanding ecchymosis below her left breast.  A CT revealed no  significant bleeding and no clear-cut evidence of leaking.  She had a  swallowing study yesterday which also revealed no evidence of a bowel  leak.  She has had considerable pain especially in her shoulders.  This  apparently is getting better with PCA pump.   HOME MEDICATIONS:  1. Her home medications include verapamil slow release 180 mg a day.  2. Chlorthalidone 25 mg a day.  3. Omeprazole 20 mg a day.  4. Crestor  10 mg a day.  5. Femara 2.5 mg a night.  6. Clonidine 0.1 mg a day.  7. Coumadin.  8. Potassium chloride 20 mEq a day.  Her current medications do not include verapamil or Coumadin.   PAST MEDICAL HISTORY:  1. AVR.  She has a mechanical aortic valve placed by Dr. Laneta Simmers.  2. Hyperlipidemia.  3. Hypertension.   SOCIAL HISTORY:  The patient is a nonsmoker and does not drink alcohol.   FAMILY HISTORY:  Is noncontributory.   REVIEW OF SYSTEMS:  Is unremarkable.   PHYSICAL EXAMINATION:  GENERAL:  She is a middle-aged female in no acute  distress.  She is alert and oriented x3 and her mood and affect are  normal.  VITAL SIGNS:  Blood pressure is 150/79 with heart rate of 115.  HEENT:  Reveals 2+ carotids.  She has no bruits, no JVD, no thyromegaly.  LUNGS:  Clear to auscultation.  HEART:  Regular rate, S1-S2 (mechanical S2).  She is tachycardic.  ABDOMEN:  Abdominal exam reveals no evidence of distention.  EXTREMITIES:  She has no edema.  NEUROLOGICAL:  Exam was nonfocal.   Her telemetry monitor reveals sinus tachycardia at 115 beats a minute.   LABORATORY DATA:  Her white blood cell count is 11.3, hemoglobin is  10.1.   CT of the abdomen and chest revealed no significant leaking or bleeding  by my read.   IMPRESSION AND PLAN:  1. Tachycardia.  The patient's tachycardia is most likely      multifactorial.  It certainly could be due to her anemia, pain from      surgery and also the absence of verapamil.  Her CT looks okay so      far so I do not think that she has a major complication at least at      this point.  She is tachycardic and mildly hypertensive.  We will      add a low dose of metoprolol (5 mg q.6 h.).  We will increase it as      needed.  2. Anticoagulation.  I agree with Dr. Abbey Chatters that we need stop the      heparin at this point.  We need to make sure that she does not have      any worsening bleeding.  Certainly she will be okay to stop it for      4 or 5  days if needed.  We will ask the surgeons to evaluate her      every day and to allow [place] to restart the heparin as soon as it is      safe from a surgical standpoint.  3. All of her other medical problems remain stable.           ______________________________  [name], M.D.     PJN/MEDQ  D:  05/11/2007  T:  05/12/2007  Job:  [id number]   cc:   Adolph Pollack, M.D.  [address], [address]  [place]  [place] [address]   Iva Boop, MD,FACG  [facility]  [address] [place], [place] [address]   Gordy Savers, MD  [address] [place]  [place] [address]

## 2010-07-20 NOTE — Op Note (Signed)
Amanda Wiggins, Amanda Wiggins               ACCOUNT NO.:  [id number]   MEDICAL RECORD NO.:  [id number]          PATIENT TYPE:  AMB   LOCATION:  ENDO                         FACILITY:  [facility]   PHYSICIAN:  Iva Boop, MD,FACGDATE OF BIRTH:  [birth year]   DATE OF PROCEDURE:  03/12/2007  DATE OF DISCHARGE:  03/12/2007                               OPERATIVE REPORT   PROCEDURE:  Esophageal manometry.   REFERRING PHYSICIAN:  Avel Peace,  M.D.   INDICATIONS:  Chest pain, awaiting hiatal hernia repair.   PROCEDURE DETAILS:  This is an esophageal manometry solid state  procedure performed by the endoscopy nursing staff.  The lower  esophageal sphincter is a 1.5 cm in length.  Proximal margin 35 cm,  distal margin 37 cm.  The lower systolic pressure is 20.5 mmHg, which is  normal.  The relaxation is adequate at 88% relaxation over 11  measurements.   Esophageal body study in the lower esophageal body.  There were 10  swallows analyzed.  Peristalsis is normal.  There are some high  amplitude contractions that I do not think are of any significant  implications.  The upper esophageal sphincter relaxes appropriately.   ASSESSMENT:  I think this is an overall normal esophageal manometry.  There are some high amplitude contractions, but these are not clinically  relevant.  There appears to be good peristalsis and clearly adequate  contractility in this lady's esophagus.      Iva Boop, MD,FACG  Electronically Signed     CEG/MEDQ  D:  03/22/2007  T:  03/23/2007  Job:  [id number]   cc:   Adolph Pollack, M.D.  [address]  [place]  [place] [address]

## 2010-07-20 NOTE — Op Note (Signed)
Amanda Wiggins, Amanda Wiggins               ACCOUNT NO.:  [id number]   MEDICAL RECORD NO.:  [id number]          PATIENT TYPE:  OIB   LOCATION:  1444                         FACILITY:  [facility]   PHYSICIAN:  Adolph Pollack, M.D.DATE OF BIRTH:  03/21/1945   DATE OF PROCEDURE:  05/09/2007  DATE OF DISCHARGE:                               OPERATIVE REPORT   PREOPERATIVE DIAGNOSIS:  Large, symptomatic hiatal hernia.   POSTOPERATIVE DIAGNOSIS:  Large, symptomatic hiatal hernia.   PROCEDURE:  Laparoscopic hiatal hernia repair with Nissen  fundoplication.   SURGEON:  Adolph Pollack, M.D.   ASSISTANT:  Wilmon Arms. Tsuei, M.D.   ANESTHESIA:  General.   INDICATIONS:  This 65 year old female has a progressively increasing  symptomatic hiatal hernia over the past 1-2 years and is failing medical  management.  She now presents for repair.  She had been on chronic  Coumadin but this been managed by Dr. Elease Hashimoto and her INR is normal this  morning.   TECHNIQUE:  She was brought to the operating room, placed supine on the  operating table, and a general anesthetic was administered.  An oral  gastric tube was placed then a Foley was placed as well.  The abdominal  wall was sterilely prepped and draped.  An incision was made superior to  the umbilicus in the midline, then divided the skin and subcutaneous  tissue, fat and fascia.  I initially inserted a Hasson trocar.  A  pneumoperitoneum was created by insufflation of CO2 gas.   A laparoscope was introduced and I noted a number of omental adhesions  right around the trocar.  I then had a 10-mm trocar placed in the left  subcostal area and one in the left midabdomen.  The camera was then  placed in the left subcostal trocar and adhesions were taken down around  the trocar site, which were omentum only.   Following this she was placed in a steep reversed Trendelenburg  position.  Two 11-mm trocars were placed in the right upper quadrant.  A  small  incision was made in the subxiphoid area and a self-retaining  liver retractor was then inserted through the small incision and it was  used to retract the left lobe of the liver anteriorly, exposing a  significant hiatal hernia.  We then grasped the stomach and reduced it  from the hernia.  There were sac contents between the right crus in the  stomach and I divided these and excised some of the sac using the  harmonic scalpel.  I then completely mobilized the right crus away from  the sac.  There were minimal adhesions anteriorly and I was able to  identify the esophagus and the gastroesophageal junction.  There were  significant connections between the sac and the stomach and crura on the  left side.  Using the harmonic scalpel, I began excising the sac in a  piecemeal-type fashion and then freeing up the left crus from the sac.  I then noticed posterior sac attachments and I was able to divide these  so well up into the mediastinum, allowing  me to mobilize the esophagus.  There was some bleeding from the lesser gastric side where there were  some sac connection, which was controlled with hemoclips and harmonic  scalpel.   Following this I then divided some short gastric vessels on the fundus  up toward the left crus.  I noted had continued posterior attachments,  which I carefully divided and used blunt dissection to separate from the  stomach and GE junction.  After doing this I had basically a 360-degree  view of the GE junction and esophagus and with no sac attachments, and  the then stomach stayed down into the stomach without evidence of a  rebound effect.  There was a large hiatus hernia noted.  I subsequently  repaired the hernia with seven nonabsorbable pledgeted sutures.   Following the hernia repair, I then brought the fundus of the stomach  through a retroesophageal window that had been created.  A size 56  lighted bougie was then passed down the esophagus into the  stomach  without difficulty.  I then performed a 2-cm, 360-degree wrap with  interrupted size 0 Surgilon sutures with the most proximal sutures  including a bite of the left leaf, the right leaf and the esophagus.   Following this I inspected the area and removed the bougie.  The wrap  was under no tension.  I did notice, though, that there was still room  for more stitch, which I had placed in the hiatus, that was pledgeted.  This provided for a better closure of the hiatus.  I inspected the  spleen, liver, esophagus and stomach and intestines and noted no injury.  I irrigated out the area and evacuated the fluid.  The area was  hemostatic.   Following this I then released the CO2 gas and removed the trocars.  The  supraumbilical fascial defect was closed using a 0 pursestring Vicryl.  The skin incisions were closed with 4-0 Monocryl subcuticular stitches.  Steri-Strips and sterile dressings were then placed over the incisions.   She tolerated the procedure without apparent complications and was taken  to recovery in satisfactory condition.      Adolph Pollack, M.D.  Electronically Signed     TJR/MEDQ  D:  05/09/2007  T:  05/09/2007  Job:  [id number]   cc:   Iva Boop, MD,FACG  [facility]  [address] [place], [place] [address]   [name], M.D.  Fax: [phone]   Rose Phi. Myna Hidalgo, M.D.  Fax: [phone]   Gordy Savers, MD  [address] [place]  [place] [address]   W. Lodema Hong, M.D.  Fax: [phone]   Artist Pais. Kathrynn Running, M.D.  Fax: [phone]

## 2010-07-21 ENCOUNTER — Ambulatory Visit (INDEPENDENT_AMBULATORY_CARE_PROVIDER_SITE_OTHER): Payer: BC Managed Care – PPO | Admitting: *Deleted

## 2010-07-21 ENCOUNTER — Other Ambulatory Visit: Payer: Self-pay | Admitting: Hematology & Oncology

## 2010-07-21 ENCOUNTER — Encounter (HOSPITAL_BASED_OUTPATIENT_CLINIC_OR_DEPARTMENT_OTHER): Payer: BC Managed Care – PPO | Admitting: Hematology & Oncology

## 2010-07-21 DIAGNOSIS — Z170 Estrogen receptor positive status [ER+]: Secondary | ICD-10-CM

## 2010-07-21 DIAGNOSIS — Z7901 Long term (current) use of anticoagulants: Secondary | ICD-10-CM

## 2010-07-21 DIAGNOSIS — I359 Nonrheumatic aortic valve disorder, unspecified: Secondary | ICD-10-CM

## 2010-07-21 DIAGNOSIS — M810 Age-related osteoporosis without current pathological fracture: Secondary | ICD-10-CM

## 2010-07-21 DIAGNOSIS — Z954 Presence of other heart-valve replacement: Secondary | ICD-10-CM

## 2010-07-21 DIAGNOSIS — C50219 Malignant neoplasm of upper-inner quadrant of unspecified female breast: Secondary | ICD-10-CM

## 2010-07-21 LAB — CBC WITH DIFFERENTIAL (CANCER CENTER ONLY)
Eosinophils Absolute: 0 10*3/uL (ref 0.0–0.5)
HGB: 14 g/dL (ref 11.6–15.9)
MONO#: 0.5 10*3/uL (ref 0.1–0.9)
NEUT#: 4.4 10*3/uL (ref 1.5–6.5)
Platelets: 289 10*3/uL (ref 145–400)
RBC: 5.04 10*6/uL (ref 3.70–5.32)
WBC: 6.5 10*3/uL (ref 3.9–10.0)

## 2010-07-21 LAB — POCT INR: INR: 2.6

## 2010-07-22 ENCOUNTER — Other Ambulatory Visit: Payer: Self-pay | Admitting: Internal Medicine

## 2010-07-22 ENCOUNTER — Encounter: Payer: BC Managed Care – PPO | Admitting: *Deleted

## 2010-07-22 LAB — COMPREHENSIVE METABOLIC PANEL
Albumin: 4.7 g/dL (ref 3.5–5.2)
BUN: 20 mg/dL (ref 6–23)
CO2: 27 mEq/L (ref 19–32)
Calcium: 10.1 mg/dL (ref 8.4–10.5)
Chloride: 101 mEq/L (ref 96–112)
Glucose, Bld: 169 mg/dL — ABNORMAL HIGH (ref 70–99)
Potassium: 3.5 mEq/L (ref 3.5–5.3)

## 2010-07-22 LAB — VITAMIN D 25 HYDROXY (VIT D DEFICIENCY, FRACTURES): Vit D, 25-Hydroxy: 49 ng/mL (ref 30–89)

## 2010-07-22 MED ORDER — OMEPRAZOLE 20 MG PO CPDR: 20.0000 mg | DELAYED_RELEASE_CAPSULE | Freq: Every day | ORAL | Status: DC

## 2010-07-22 NOTE — Telephone Encounter (Signed)
Pt called and is req that Omeprazole 20 mg caps, be sent in to [facility] order pharmacy. Pt said that it may be time for Dr Amador Cunas to send the yearly letter, stating that this med in medically necessary.

## 2010-07-22 NOTE — Telephone Encounter (Signed)
Med sent to [facility] - pt needs to be seen - last seen 04/2009

## 2010-07-23 NOTE — Discharge Summary (Signed)
NAME:  Amanda Wiggins, Amanda Wiggins                 ACCOUNT NO.:  [id number]   MEDICAL RECORD NO.:  [id number]                   PATIENT TYPE:  INP   LOCATION:  2022                                 FACILITY:  [facility]   PHYSICIAN:  Evelene Croon, M.D.                  DATE OF BIRTH:  28-Jul-1945   DATE OF ADMISSION:  12/31/2001  DATE OF DISCHARGE:  01/05/2002                                 DISCHARGE SUMMARY   PRIMARY ADMITTING DIAGNOSIS:  Severe aortic stenosis.   ADDITIONAL DIAGNOSES:  1. History of migraines.  2. History of endometrial cancer, status post hysterectomy in the past.   PROCEDURE PERFORMED:  Aortic valve replacement with a 21-mm St. Jude Regent  valve.   HISTORY:  The patient is a 65 year old white female with a history of aortic  stenosis which has been followed by echocardiograms since 1999.  Over the  past year, she had had worsening symptoms of shortness of breath, mainly  with exertion.  She denies any chest pain or anginal-type symptoms.  She  underwent recent cardiac catheterization which showed normal right and left  heart pressures.  It was not possible to cross the aortic valve.  Her  coronaries were normal.  A repeat echocardiogram showed mild concentric left  ventricular function with normal ejection fraction and bicuspid valve with a  peak gradient of 70 mmHg and a mean gradient of 41 mmHg.  The estimated  valve area was 0.52 sq cm.  It was felt that in light of her worsening  symptoms and her increasing stenosis, she would benefit from surgical repair  at this time.   HOSPITAL COURSE:  She was admitted on December 31, 2001 and taken to the  operating room where she underwent aortic valve replacement as noted above.  She tolerated the procedure well and post-bypass transesophageal  echocardiography showed a normally functioning mechanical valve.  She was  transferred to the SICU in stable condition.  She was extubated shortly  after surgery.  She was  hemodynamically stable and doing well postop day 1.  At that time, she was started on Coumadin and transferred to the floor.  Postoperatively, she has done well.  She was initially quite nauseated and  her p.o. intake was very poor, however, her medication list was reviewed and  several medications which were thought to possibly be culprits were  discontinued, such as narcotics and stool softener; since that time, her  nausea has resolved and her GI function has been normal.  She has been  tolerating a regular diet.  She has been ambulating in the halls with  cardiac rehab phase 1 and is doing quite well.  She has been anticoagulated  and her INR is presently therapeutic at 2.0, with a PT of 20.9.  She is  still several pounds above her preoperative weight, however, her diuretics  were also stopped because of her nausea.  She is slowly diuresing on her  own.  Her incisions are healing well and her valve is functioning  appropriately.  It is felt that if she continues to remain stable, she will  be ready for discharge home on January 05, 2002.   DISCHARGE MEDICATIONS:  1. Coumadin 2.5 mg every day.  2. Ultram 50 to 100 mg q.4h. p.r.n. for pain.  3. Imipramine 50 mg every day.  4. _________ 100 mg every day.  5. Verelan 180 mg every day.  6. Premarin 0.3 mg every day.  7. Allegra 180 mg every day.  8. Calcium 600 mg plus D one tablet daily.  9. Flaxseed oil at home dose.  10.      Glucosamine and chondroitin at home doses.   DISCHARGE INSTRUCTIONS:  She is to refrain from driving, heavy lifting or  strenuous activity.   DIET:  She may continue a low-fat, low-sodium diet.   WOUND CARE:  She is asked to shower daily and clean her incisions with soap  and water.   DISCHARGE FOLLOWUP:  She is asked to have a PT and INR drawn in Dr. Ladona Horns.  Neijstrom's office on Monday and they will manage her Coumadin accordingly.  Also, she is asked to make an appointment with Dr. Elease Hashimoto for two  weeks and  have a chest x-ray at that time.  She will see Dr. Evelene Croon in followup  on November 25th at 9:15 a.m. and is asked to bring a chest x-ray to this  appointment.      Coral Ceo, P.A.                        Evelene Croon, M.D.    GC/MEDQ  D:  01/04/2002  T:  01/06/2002  Job:  [id number]   cc:   [name], M.D.   Gordy Savers, M.D. [facility]

## 2010-07-23 NOTE — Op Note (Signed)
NAMEFRANKLIN, CLAPSADDLE               ACCOUNT NO.:  [id number]   MEDICAL RECORD NO.:  [id number]          PATIENT TYPE:  INP   LOCATION:  4145                         FACILITY:  [facility]   PHYSICIAN:  Doralee Albino. Carola Frost, M.D. DATE OF BIRTH:  Sep 15, 1945   DATE OF PROCEDURE:  DATE OF DISCHARGE:  02/04/2004                                 OPERATIVE REPORT   DATE OF OPERATION:  February 03, 2004.   PREOPERATIVE DIAGNOSES:  1.  Grade 2 open right olecranon fracture.  2.  Closed right shoulder greater tuberosity fracture/dislocation.   POSTOPERATIVE DIAGNOSES:  1.  Grade 2 open right olecranon fracture.  2.  Closed right shoulder greater tuberosity fracture/dislocation.   PROCEDURE:  1.  Irrigation and debridement of the right olecranon.  2.  Open treatment, right olecranon fracture, with excision of fracture      fragment.  3.  Closed reduction of right shoulder dislocation.  4.  Manipulation of greater tuberosity fracture with closed treatment.   SURGEON:  Doralee Albino. Carola Frost, MD.   ASSISTANT:  Cecil Cranker, PA.   ANESTHESIA:  General.   COMPLICATIONS:  None.   ESTIMATED BLOOD LOSS:  10 mL.   SPECIMENS:  None.   DRAINS:  One medium Hemovac.   DISPOSITION:  To PACU.   CONDITION:  Stable.   BRIEF SUMMARY OF INDICATION FOR PROCEDURE:  Amanda Wiggins is a 65 year old,  right-hand-dominant, white female, who fell earlier today sustaining an open  fracture of her right elbow and a fracture/dislocation of her right  shoulder.  [place] and she  was sent to [facility] for further evaluation and management.   PAST MEDICAL HISTORY:  1.  Aortic valve replacement for bicuspid disease.  2.  Cancer, both of the endometrium and the breast, both of which were      thought to not involve any malignant disease.   After discussion of the risk and benefits of surgery including the risk of  bleeding secondary to an INR of 2.7 because of  chronic Coumadin therapy for  the valve replacement, the patient elected to proceed.  The plan was to  administer blood products if needed and to delay treatment of her greater  tuberosity fracture should the fragment not be capable of being manipulated  back into position.   BRIEF SUMMARY OF PROCEDURE:  [name] was administered Ancef in the  emergency department because of her open fracture.  She was taken to the  operating room where, after administration of general anesthesia, she  underwent a gentle reduction of her right shoulder.  The shoulder was then  taken through a rotation and the greater tuberosity fragment pushed back  into position.  The humerus was then taken through a gentle range of motion,  and the fragment noted to be stable under live fluoroscopic examination.  An  axillary view was obtained to ensure appropriate reduction.   Attention was then turned to the olecranon injury, which had two parallel  lacerations of approximately 2.5-cm each.  The medial wound was excised with  a  1-mm perimeter and extended proximally and distally to enable access to  the fracture off the medial aspect of the olecranon.  There was a  contamination of the cortex involving the olecranon.  Dirt could be seen,  and this was removed with a rongeur and we did take some of the soft  cancellous bone in addition to contaminated cortex.  A portion of cortical  bone, which had been knocked off the medial aspect, was incised as it was  not necessary for stability and could lead to infection.  Other stripped  fragments were removed as well as part of the standard debridement in  addition to the excision of the fragment.  Three liters of pulsatile lavage  using the PulsaVac directed at this area of exposed bone was then performed.  We were careful to debride additional subcutaneous tissue and fascia that  may have been contaminated or did not appear to have a good vascular supply.  A medium  Hemovac was then brought out along the extensor surface of the  forearm and brought superficial to an initial stitch that was attempting to  cover the exposed bone with some fat in the area and attempt to get that to  adhere down and prevent some of the dead space.  The drain was left  superficial to this, but below the subcutaneous layer.  The skin was then  closed with nylon sutures in a simple fashion.  PDS was used for the  subcuticular layer.  A sterile dressing and posterior splint to protect the  soft tissue wound were then applied.  The patient was placed in a sling and  taken to the PACU in stable condition.  We did not encounter significant  bleeding of any kind, and no tourniquet was used during the procedure.   PROGNOSIS:  [name] will remain on perioperative antibiotics until such  time as she is deemed stable for discharge to home.  We will cover her with  seven days of prophylactic antibiotics, most likely Duricef 500 mg p.o.  b.i.d. because of her heart valve in an attempt to prevent infection from  any possible colonization.  We will leave her Hemovac drain until the time  that she is discharged to home as well.  We anticipate that she will begin  range of motion of the right elbow as soon as her wound has healed, and also  that we will initiate passive range of motion of the right shoulder upon her  return to clinic in 10 days.  At that time, we will obtain a two-view x-ray  of the shoulder to ensure that there has been no migration of the fracture  fragment.  If there is not, we will continue with nonoperative treatment,  initiating active range of motion at probably the six-week mark.  Because of  her open fracture, of course, she remains at increased risk for wound  problems and infection at the elbow.      [place]  MHH/MEDQ  D:  02/04/2004  T:  02/04/2004  Job:  [id number]

## 2010-07-23 NOTE — H&P (Signed)
NAME:  Amanda Wiggins, Amanda Wiggins                 ACCOUNT NO.:  [id number]   MEDICAL RECORD NO.:  [id number]                   PATIENT TYPE:  OIB   LOCATION:  NA                                   FACILITY:  [facility]   PHYSICIAN:  [name], M.D.              DATE OF BIRTH:  29-Oct-1945   DATE OF ADMISSION:  DATE OF DISCHARGE:                                HISTORY & PHYSICAL   HISTORY OF PRESENT ILLNESS:  The patient is a 65 year old female with a  history of a hysterectomy due to cancer.  She was recently found to have  aortic stenosis.  She is referred for heart catheterization for further  evaluation of this aortic stenosis.   The patient has a history of shortness of breath and some chest discomfort.  A transthoracic echocardiogram recently revealed a bicuspid aortic valve  with severe aortic stenosis.  She had a transesophageal echo last week which  confirmed the presence of severe aortic stenosis.  She is admitted now for  further evaluation.   The patient continues to have shortness of breath and fatigue. She says that  she has become more fatigued over the past several months and is now not  able to do much in the way of activities.  She does not get any regular  exercise because of her severe shortness of breath.   CURRENT MEDICATIONS:  1. Imipramine 50 mg a day.  2. Verapamil 180 mg a day.  3. Allegra 180 mg a day.  4. Premarin 0.3 mg a day.  5. Glucosamine once a day.  6. Calcium once a day.  7. Flaxseed oil once a day.   ALLERGIES:  She is allergic to SULFA and DEMEROL.   PAST MEDICAL HISTORY:  1. History of bicuspid aortic valve with moderate-to-severe aortic stenosis.  2. History of GYN cancer -- status post hysterectomy.  3. History of headaches.   SOCIAL HISTORY:  The patient works for the [employer].  She  does not smoke.  She drinks alcohol only infrequently.   FAMILY HISTORY:  Her father is 75 years old and has had a valve  replacement.  Her mother is 6 years old and has a history of coronary artery disease and  a myocardial infarction.   REVIEW OF SYSTEMS:  Review of systems was reviewed and is essentially  negative.   PHYSICAL EXAMINATION:  GENERAL:  On exam, she is a middle-aged female in no  acute distress.  She is alert and oriented x3 and her mood and affect are  normal.  Her weight is 161 pounds.  VITAL SIGNS:  Her blood pressure is 132/92 with a heart rate of 90.  HEENT/NECK:  Exam reveals 2+ carotids.  She has no bruits.  There is no JVD  and no thyromegaly.  LUNGS:  Lungs are clear to auscultation.  HEART:  Regular rate.  S1 and S2.  She has a 2-3/6 systolic ejection murmur  at the left sternal border.  ABDOMEN:  Exam reveals good bowel sounds and is nontender.  EXTREMITIES:  She has no clubbing, cyanosis, or edema.  NEUROLOGIC:  Exam was nonfocal.   LABORATORY AND ACCESSORY CLINICAL DATA:  Her EKG from last week reveals  normal sinus rhythm with nonspecific ST and T wave abnormalities.   ASSESSMENT AND PLAN:  The patient presents with severe aortic stenosis by  echocardiography.  We will proceed with heart catheterization for further  evaluation.  We have discussed the risks, benefits and options of heart  catheterization.  She understands and agrees to proceed.                                               [name], M.D.    PJN/MEDQ  D:  12/17/2001  T:  12/19/2001  Job:  [id number]   cc:   Gordy Savers, M.D. [facility]

## 2010-07-23 NOTE — Discharge Summary (Signed)
NAME:  Amanda Wiggins, Amanda Wiggins                         ACCOUNT NO.:  [id number]   MEDICAL RECORD NO.:  [id number]                   PATIENT TYPE:  INP   LOCATION:  5743                                 FACILITY:  [facility]   PHYSICIAN:  Currie Paris, M.D.           DATE OF BIRTH:  02/16/1946   DATE OF ADMISSION:  07/01/2003  DATE OF DISCHARGE:  07/07/2003                                 DISCHARGE SUMMARY   FINAL DIAGNOSES:  1. Carcinoma of the left breast, upper inner quadrant, stage 1.  2. Chronic Coumadin anticoagulation therapy.  3. Status post aortic valve replacement.   CLINICAL HISTORY:  Amanda Wiggins is a 65 year old lady who was recently  diagnosed with  breast cancer and whose plans were for left lumpectomy with  sentinel node biopsy. She was admitted to the hospital a few days  preoperatively because of need for switching from Coumadin to heparin  anticoagulation perioperatively.   HOSPITAL COURSE:  The patient was admitted and having been off of Coumadin  for a few days, she was placed on heparin. On July 03, 2003, she was taken  to the operating room and a left needle localized lumpectomy with blue dye  injection and sentinel lymph node biopsy were performed. The patient  tolerated the procedure well.   Postoperatively, she was restarted on her heparin and switched over to her  Coumadin. In terms of her wound, everything went fine. There were no  problems with that. It took [place] a few days postoperatively to get her INR  appropriate for discharge. This occurred on Jul 07, 2003, and the patient was  able to be discharged.   Her wound looked fine. There are no problems with that. Pathology report  said a 1.7 cm tumor with negative sentinel node. The pathology was discussed  with the patient prior to discharge.   The patient was discharged in satisfactory condition, on some Vicodin for  pain, limited activities and follow up in my office in approximately 7-10  days. She is to go  back on her usual home medications.                                                Currie Paris, M.D.    CJS/MEDQ  D:  07/16/2003  T:  07/16/2003  Job:  [id number]   cc:   [name], M.D.  [address]  [place]  [place] [address]  Fax: [phone]

## 2010-07-23 NOTE — Cardiovascular Report (Signed)
NAME:  Amanda Wiggins, Amanda Wiggins                 ACCOUNT NO.:  [id number]   MEDICAL RECORD NO.:  [id number]                   PATIENT TYPE:  OIB   LOCATION:  2864                                 FACILITY:  [facility]   PHYSICIAN:  [name], M.D.              DATE OF BIRTH:  Sep 16, 1945   DATE OF PROCEDURE:  12/19/2001  DATE OF DISCHARGE:                              CARDIAC CATHETERIZATION   PROCEDURE:  Right and left heart catheterization.   INDICATIONS FOR PROCEDURE:  The patient is a 65 year old female with aortic  stenosis by echocardiography as well as transesophageal echocardiography.  She is referred for heart catheterization for evaluation of her coronaries  and to verify her aortic valve gradient.   PROCEDURAL NOTE:  The right femoral artery and the right femoral vein were  easily cannulated using the modified Seldinger technique.   HEMODYNAMICS:  1. The RA pressure was 26/3.  2. The PA pressure was 27/7.  3. The pulmonary capillary wedge pressure was 6.  4. The aortic pressure was 164/92.  5. Cardiac output by Fick is 3.2 liters/minute.  The cardiac output by     thermodilution is 5.2 liters/minute.  6. We were not able to cross the aortic valve even after multiple catheter     and multiple wire attempts.  It was decided to not pursue crossing the     valve any further for fear of tearing up the valve with the guidewires.     It has been clearly documented that she has a high aortic valve gradient     and a very small valve by planimetry on transesophageal echocardiogram.   CORONARY ANGIOGRAPHY:  1. The left main coronary artery was smooth and normal.  2. The left anterior descending artery was a large vessel.  It gives off a     large first obtuse marginal artery which is normal.  The left anterior     descending artery is fairly tortuous but is otherwise normal.  3. The left circumflex artery is a moderate-sized vessel.  It gives off     several small marginal  branches which are normal.  It terminates as two     separate marginal branches.  4. The right coronary artery is fairly large and is dominant.  It is smooth     and normal throughout its course.  There are several posterolateral     branches and tandem posterior descending arteries that supply the     inferior wall.  All of these branches are normal.   COMPLICATIONS:  None.   CONCLUSION:  1. Smooth and normal coronary arteries.  2. Documented aortic stenosis by previous studies.  We were not able to     measure her aortic valve gradient.  3. Normal right-sided heart pressures.  We will refer her to CVTS for     consideration for aortic valve replacement.  [name], M.D.   PJN/MEDQ  D:  12/19/2001  T:  12/19/2001  Job:  [id number]

## 2010-07-23 NOTE — Op Note (Signed)
NAME:  Amanda Wiggins, Amanda Wiggins                         ACCOUNT NO.:  [id number]   MEDICAL RECORD NO.:  [id number]                   PATIENT TYPE:  INP   LOCATION:  5743                                 FACILITY:  [facility]   PHYSICIAN:  Currie Paris, M.D.           DATE OF BIRTH:  [birth year]   DATE OF PROCEDURE:  DATE OF DISCHARGE:                                 OPERATIVE REPORT   PREOPERATIVE DIAGNOSIS:  Carcinoma, left breast, upper inner quadrant.   POSTOPERATIVE DIAGNOSIS:  Carcinoma, left breast, upper inner quadrant.   OPERATION:  Needle-guided left lumpectomy with blue dye injection and x-ray  of sentinel lymph node biopsy (one node).   SURGEON:  Currie Paris, M.D.   ANESTHESIA:  General.   HISTORY:  This patient is a 65 year old lady who presented with a small  breast cancer in the upper inner quadrant of the left breast.  She was  scheduled for needle-guided excision with plans for postop radiation and  plans for sentinel node biopsy.   DESCRIPTION OF PROCEDURE:  The patient was seen in the holding area and had  no further questions.  She had been hospitalized a couple of days earlier to  be anticoagulated on heparin as she transitions off of her Coumadin.  The  patient had no further questions.  She was taken to the operating room and  after satisfactory general anesthesia had been obtained, the left breast was  prepped and draped.  The area had been marked in the holding area prior to  going to the operating room as the operative site.  After anesthesia had  been obtained, I injected 4 mL of dilute methylene blue subareolarly.  I  almost immediately saw lymphatic heading out towards the axilla.  The breast  was then prepped and draped.  The guide wire entered medially and tracked  laterally.  The skin overlying the tumor itself had been marked by Radiology  so I knew exactly where the tumor was in relation to the guide wire site.  I  made an incision starting just  lateral to the guide wire entry site directly  over it and over the tumor.  As soon as I got through the subcutaneous  tissues I elevated a little flap towards the guide wire and manipulated it  into the wound.  I then went in a little superior flap and then took it down  towards the chest wall then went around medially, then inferiorly, and then  finally detached the lumpectomy specimen laterally beyond the guide wire  tip.  This came out intact and I could palpate what appeared to be the tumor  in the mass with what looked like a margin of normal tissue around it in all  directions.  I used the margin marker to orient this for pathology and sent  it for specimen mammography.  Some Marcaine was injected to help with postop  analgesia.  A  check was made for hemostasis and everything appeared to be  dry.  I put four clips to mark the margins medial, laterally, superiorly,  and inferior, and one clip deep, which was just on a little fatty tissue  superficial to the pectoralis.  A pack was placed here and attention turned  to the axilla.  Using the needle probe, I found a hot area and made a  transverse incision directly over this, divided through the subcutaneous  tissue, identified a blue lymphatic, traced it into a hot blue lymph node.  Just a little tugging tore the lymphatic and it spilled blue dye but found a  1-cm node that had counts of about 1000.  Once this was excised with the  cautery, background counts were down to 0 to 20 and there were no other hot  areas, no other blue areas, and no palpable adenopathy noted.  At this point  this incision was closed after making sure everything was completely dry  using 3-0 Vicryl and 4-0 Monocryl.  Went back and checked the breast  incision and it was dry, closed it with 3-0 Vicryl, then closed the subcu  leaving the lumpectomy cavity and then the skin with 4-0 Monocryl  subcuticular.  Dr. Clelia Croft reported that the sentinel node was negative on   touch prep.  The lumpectomy specimen was noted to be  contained within the tissue, the tumor was noted to be contained with the  tissue by specimen mammography, and Dr. Clelia Croft thought grossly all the margins  were negative and there was no indication for touch prep.  The patient  tolerated the procedure well.  There were no operative complications.  All  counts were correct.                                               Currie Paris, M.D.    CJS/MEDQ  D:  07/03/2003  T:  07/03/2003  Job:  [id number]

## 2010-07-23 NOTE — Op Note (Signed)
NAME:  Amanda Wiggins, Amanda Wiggins                 ACCOUNT NO.:  [id number]   MEDICAL RECORD NO.:  [id number]                   PATIENT TYPE:  INP   LOCATION:  2310                                 FACILITY:  [facility]   PHYSICIAN:  Evelene Croon, MD                    DATE OF BIRTH:  Jun 29, 1945   DATE OF PROCEDURE:  12/31/2001  DATE OF DISCHARGE:                                 OPERATIVE REPORT   PREOPERATIVE DIAGNOSIS:  Severe aortic stenosis.   POSTOPERATIVE DIAGNOSIS:  Severe aortic stenosis.   PROCEDURES:  1. Median sternotomy.  2. Extracorporeal circulation.  3. Aortic valve replacement using a  21.0 mm St. Jude mechanical valve.   SURGEON:  Evelene Croon, M.D.   ASSISTANT:  Coral Ceo, P.A.-C.   ANESTHESIA:  General endotracheal.   INDICATIONS FOR PROCEDURE:  This patient is a 65 year old woman with a  history of aortic stenosis, followed by echocardiograms since 1999.  She has  noticed worsening shortness of breath over the past year, particularly when  trying to climb stairs.  Repeat echocardiogram recently showed mild  concentric left ventricular hypertrophy with a normal ejection fraction.  She had a bicuspid valve with a peak gradient of 70 mmHg and a mean gradient  of 41 mmHg.  The estimated aortic valve area was 0.52 sq cm.  The cardiac  catheterization on December 19, 2001, showed normal right and left heart  pressures.  It was not possible to cross the aortic valve.  The coronaries  were normal.  After a review of the angiogram and echocardiogram results,  and examination of the patient, it was felt that an aortic valve replacement  using a St. Jude mechanical valve would be the best treatment for this  relatively young patient with progressive symptoms of aortic stenosis.  I  discussed the operative procedure with her and with her husband, including  alternatives, benefits and risks, including bleeding, blood transfusion,  infection, stroke, myocardial infarction and  death.  They understood and  agreed to proceed.   DESCRIPTION OF PROCEDURE:  The patient was taken to the operating room and  placed on the table in the supine position.  After induction of general  endotracheal anesthesia, a Foley catheter was placed in the bladder using a  sterile technique.  Then the chest, abdomen, and both lower extremities were  prepped and draped in the usual sterile manner.  The chest was entered  through a median sternotomy incision, and the pericardium opened in the  midline.   Examination of the heart showed left ventricular contractility.  The  ascending aorta had no palpable plaques in it.  Then a transesophageal  echocardiogram was performed by anesthesiology.  This showed bicuspid aortic  valve with severe aortic stenosis.  There was no significant mitral  regurgitation.  The left ventricular function appeared normal.   Then the patient was heparinized and when an adequate activated clotting  time was achieved,  the distal ascending aorta was cannulated using a 20-  French aortic cannula for arterial inflow.  The venous outflow was achieved  using a two-stage venous cannula in the right atrial appendage.  An  antegrade cardioplegic and vent cannula was inserted in the aortic root.  A  retrograde cardioplegic cannula was inserted through the right atrium and in  the coronary sinus.  The left ventricular vent was placed through the right  superior pulmonary vein.   The patient was placed on cardiopulmonary bypass.  The aorta was then cross-  clamped and 500 cc of cold blood antegrade cardioplegia was administered in  the aortic root for a quick arrest of the heart.  Systemic hypothermia to 20  degrees Centigrade and topical hypothermia with iced saline were used.  A  temperature probe was placed in the septum and an insulating pad in the  pericardium.  Additional doses of retrograde cardioplegia were given in  approximately 20-30 minute intervals, to  maintain a myocardial temperature  around 10 degrees Centigrade.   Then the aorta was opened transversely about 1.0 cm above the takeoff of the  right coronary artery.  Examination of the native valve showed that there  was a bicuspid  valve with complete fusion of the commissure between the  right and the left coronary cusps.  There was calcification of the valve and  poorly mobile leaflets.  The coronary arteries had their ostia in normal  location.  The native valve was excised.  Care was taken to remove all  particulate debris.  The left ventricle was irrigated with iced saline  solution.  The annulus was sized, and a 21.0 mm St. Jude mechanical valve  was chosen.   The pledgets were then placed using #2-0 Ethibond pledgetted horizontal  mattress sutures, with the pledgets in the subannular position.  The sutures  were then placed through the sewing ring and the valve lowered into place.  The valve seated nicely and the sutures were tied sequentially.  The  leaflets were moving normally.  There was no evidence of valvular  obstruction.  Then the patient was rewarmed to 37 degrees Centigrade.  The  aortotomy was then closed in two layers using continuous #4-0 Prolene  suture.  The left side of the heart was deaired.  The head was then placed  in the Trendelenburg position.  The cross-clamp was then removed with a time  of 78 minutes.  There was spontaneous return of  ventricular fibrillation,  and the patient was defibrillated into sinus rhythm.  The aortotomy site appeared hemostatic.  Two temporary left ventricular and  right atrial pacing wires were placed and brought out through the skin.   Then the patient was rewarmed to 37 degrees Centigrade.  She was weaned from  cardiopulmonary bypass on no inotropic agents.  The total bypass time was  110 minutes.  The transesophageal echocardiogram was performed and showed a normal functioning St. Jude mechanical valve prosthesis.  There  was no  evidence of perivalvular leak or insufficiency of the valve.  Both leaflets  appeared to be moving normally.  Then Protamine was given and the venous and  the aortic cannulas were removed without difficulty.  Hemostasis was  achieved.  Two chest tubes were placed, with two in the posterior  pericardium and one in the anterior mediastinum.   The pericardium was tight and could not completely approximate over the  heart.  The sternum was closed with #6 stainless steel wire.  The fascia was  closed with a continuous #1 Vicryl suture.  The subcutaneous tissue was  closed with continuous #2-0 Vicryl, and the skin with #3-0 Vicryl  subcuticular closure.  The sponge, needle and instrument  counts were correct according to the scrub nurse.  A dry sterile dressing  was applied over the incision and around the chest tubes, which were hooked  to Pleurovac suction.   The patient remained hemodynamically stable, and was transported to the SICU  in guarded but stable condition.                                               Evelene Croon, MD    BB/MEDQ  D:  12/31/2001  T:  12/31/2001  Job:  [id number]   cc:   [name], M.D.   Cardiac Catheterization Laboratory - [facility]   CTVS office

## 2010-07-23 NOTE — Discharge Summary (Signed)
[facility]  Patient:    Amanda Wiggins, Amanda Wiggins              MRN: [id number] Adm. Date:  [id number] Disc. 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 Pertinent findings are limited to the pelvis where she has a nulliparous marital outlet. External genitalia is normal but the cervix is smooth and nulliparous. The uterus is anterior upper limits of normal size, slightly irregular. There are no adnexal masses, no masses in the rectum.  LABORATORY DATA:  Preop hemogram was normal with a hemoglobin of 13.8. Postoperative hemoglobin was 12.1. Follow-up CBC on Jul 08, 1999 was within normal limits. Electrolytes were normal. Urinalysis was normal for a voided specimen.  Preoperative EKG showed some abnormal changes. There was a possible anterior infarct age undetermined. There was no old tracing to compare with. Strip done preoperatively was normal. The abnormality was deemed to be an ST abnormality which was nonspecific. Chest film was normal.  HOSPITAL COURSE:  The patient was taken to the operating room where a total abdominal hysterectomy and bilateral salpingo-oophorectomy was done. Also pelvic washings were done. Intraoperative examination by the pathologist showed no evidence of myometrial invasion so a node dissection was not done. Postoperatively, the patient did well. The catheter was removed the first postoperative day and she began ambulating. Bowel sounds were somewhat scant. Cardiology was consulted postoperatively because of the mild abnormalities in her EKG and it was concluded that she had no active heart disease and there was on treatment necessary. On May 3, she ran a temperature of 100.8, CBC was done and was normal. She was  encouraged to use her incentive spirometer and ambulate more. This was effective in that her fever remitted spontaneously. By May 4, she was afebrile, the wound was healing nicely. She had had a bowel movement and was taking p.o. fluids and solids. The patient was discharge to her home that day. She was instructed as to limited activity, no vaginal penetration, no heavy lifting, no driving and to return to my office in 3 days time for staple removal. She s given a prescription of Tylenol #3 to use for pain. Examination of the tissue removed showed negative washings. The uterine specimen showed severe atypical complex hyperplasia but no evidence of invasive adenocarcinoma was identified. She also had adenomyosis, benign leiomyomata and some endometriosis in the right ovary.  FINAL DIAGNOSES:  1. Severe atypical complex hyperplasia, no invasive adenocarcinoma     identified. She did however have foci of adenocarcinoma identified on her     biopsy specimen several weeks earlier.  2. Adenomyosis of the uterus.  3. Benign leiomyomata.  4. Right ovarian endometriosis.  5. Migraine headaches treated.  6. Hypertension treated.  7. Allergy to SULFA.  8. Postmenopausal female.  9. Chronic allergies. 10. Bicuspid atrial valve by history. No evidence of aortic insufficiency.  OPERATIONS:  Total abdominal hysterectomy and bilateral salpingo-oophorectomy. Pelvic and abdominal peritoneal washings.  CONDITION ON DISCHARGE:  Improved. DD:  08/06/99 TD:  08/10/99 Job: [id number]

## 2010-07-23 NOTE — H&P (Signed)
[facility]  Patient:    Amanda Wiggins, Amanda Wiggins              MRN: [id number] Adm. Date:  [id number] Attending:  Susa Raring                         History and Physical  HISTORY OF PRESENT ILLNESS:  This patient is a 65 year old nulligravida admitted to the hospital with a tissue diagnosis of atypical endometrial hyperplasia with fossae of well-differentiated adenocarcinoma.  This diagnosis is biopsy-proven having had a endometrial biopsy done on June 07, 1999.  The patient has been on  hormone replacement therapy for approximately five years now, and really has had no complaints until lately.  in February she starting having some spotting and bleeding on one day.  She denies missing any of her pills and she reports that he had just finished a course of antibiotics when this had happened.  Her Pap smear done on May 17, 1999, was reported as normal.  She has no other complaints.  PAST MEDICAL HISTORY:  The patient has had the usual childhood diseases.  The patient has had no previous surgery.  Medical history shows that she has had a history of migraine headaches for which she has been treated by Dr. Meryl Crutch and she has also had a history of hypertension for which she is followed by Dr. Patty Sermons.  She also has a history of bicuspid aortic valve and mild aortic  stenosis.  There is no history of bleeding disorders in her past.  MEDICATIONS: 1. Imipramine 50 mg a day. 2. Topamax 75 mg a day. 3. Verapamil 180 mg daily. 4. Allegra in the allergy season. 5. She takes Cafergot occasionally when she has headaches and Reglan when    needed.  ALLERGIES:  She is allergic to SULFA and reports no other drug allergies.  SOCIAL HISTORY:  She does not smoke and is an occasionally social drinker.  FAMILY HISTORY:  There is no history of bleeding disorders, tuberculosis or diabetes.  She has one aunt who had breast cancer.  There is no history  of ovarian or colon cancers.  REVIEW OF SYSTEMS:  ENT:  No complaints.  CARDIORESPIRATORY:  No complaints. GASTROINTESTINAL:  No complaints.  GENITOURINARY:  See present illness. NEUROMUSCULAR:  No complaints.  PHYSICAL EXAMINATION:  VITAL SIGNS:  This is a well-nourished, well-developed 65 year old female. She s 5 feet 1 inch tall, weighs 151 pounds.  Her blood pressure is 120/84.  HEENT:  Head and neck show pupils, equal, round, reactive to light and accommodation.  Ear, nose and throat are normal.  Thyroid normal sized.  HEART:  Heart rhythm regular.  No murmurs heard.  LUNGS:  Clear to auscultation and percussion.  BREASTS:  Show no palpable masses.  Her mammogram was normal in March of 2001.   ABDOMEN:  Soft.  There are no palpable masses.  There is no tenderness.  Bowel sounds are present.  SKIN:  Smooth and dry.  EXTREMITIES:  Show no deformities.  No limitation of motion.  Peripheral pulses are present.  NEUROLOGICAL:  Examination is psychologic.  PELVIC:  External genitalia is normal with a martial outlook.  Vagina is lined ith healthy mucosa of the cervix, smooth and nulliparous.  The uterus is anterior, upper limits of normal size.  Just slightly enlarged and is slight irregular. his is felt to be due to a fibroid which she has know to be present for  at least two years.  There are no adnexal masses palpable.  Cul-de-sac is free of masses.  RECTAL:  Examination is negative and Hemoccult is negative.  IMPRESSION: 1. Atypical endometrial hyperplasia with fossae, well-differentiated adenocarcinoma. 2. Hypertension, treated. 3. History of migraine headaches. 4. Allergy to SULFA drugs. 5. Postmenopausal female. 6. Chronic allergies.  PLAN/PROCEDURE:  The patient will be taken to the operating room where it is planned to do a total abdominal hysterectomy and bilateral salpingo-oophorectomy. Depending on the invasive nature or the absence of it, in this  tumor, she may or may not undergo a pelvic node and aortic node dissection.  Accordingly, Dr. Ronita Hipps will be scrubbed in on this operation in case that is needed.  The patient understands that she will no longer have periods and that she will not be able o conceive any children, which she does not care about anyway.  She understands complications of pelvic surgery included but not limited to bleeding, infection, injury to adjacent structures, and death.  She agrees to undergo this procedure. DD:  07/06/99 TD:  07/06/99 Job: [id number]

## 2010-07-23 NOTE — Op Note (Signed)
[facility]  Patient:    Amanda Wiggins, Amanda Wiggins              MRN: [id number] Proc. Date: 07/06/99 Adm. Date:  [id number] Attending:  Susa Raring                           Operative Report  PREOPERATIVE DIAGNOSIS: 1. Atypical endometrial hyperplasia with foci of well differentiated  adenocarcinoma, biopsy proven. 2. Small uterine myoma.  POSTOPERATIVE DIAGNOSIS: 1. Atypical endometrial hyperplasia with foci of well differentiated  adenocarcinoma, biopsy proven. 2. Small uterine myoma.  There was no gross evidence of myometrial invasion as reported by the pathologist.  OPERATION PERFORMED: 1. Total abdominal hysterectomy and bilateral salpingo-oophorectomy. 2. Pelvic and abdominal washings.  SURGEON:  [name], M.D.  ASSISTANT:  Jackquline Denmark. Kyla Balzarine, M.D.  ANESTHESIA:  DESCRIPTION OF PROCEDURE:  After good anesthesia, the patient was prepped and draped in a sterile manner.  The bladder was catheterized with a Foley catheter.  A midline incision was made from the pubis up to the umbilicus.  Bleeders were cauterized as encountered.  The fascia was opened vertically as was the peritoneum. Once the peritoneum was opened, saline was used to secure pelvic and abdominoperitoneal washings.  These were sent as a separate specimen.  Once this was done, the upper abdomen was explored.  The liver was smooth and normal. The gallbladder was normal.  Both kidneys were normal.  Stomach and upper abdominal  organs in the area of the pancreas were all normal to palpation.  The gutters were smooth.  The peritoneum was smooth throughout.  In the pelvis, the uterus was anterior.  It was slightly enlarged and there appeared to be a firm myoma in the uterine fundus.  Tubes were normal.  The ovaries were small and atrophic.  The pelvic peritoneum was smooth and shiny.  There were no palpable periaortic or pelvic nodes.  Hysterectomy was undertaken.   The round ligaments were divided with electrocautery.  A bladder flap and peritoneum opened using the Bovie.  Once the bladder flap was developed, the ureters were visualized and were well below the  operative site.  A window in the posterior leaf of the broad ligament was made nd the unfundibulopelvic ligament was clamped, cut and doubly ligated.  This was done on both sides.  Once the round ligaments and the infundibular ligaments were freed up and tied off, the uterine vessels were skeletonized.  They were then clamped, cut and ligated.  The bladder was pushed down well off the anterior surface of he cervix down to the anterior vagina.  The cardinal ligaments were clamped, cut and ligated and the uterosacral ligaments were then clamped, cut and ligated.  The vagina was entered laterally, the cervix circumcised and the specimen removed. The lateral vaginal apices were secured with a Heaney suture on either side and then the cuff was run with a locked continuous Vicryl to close the cuff.  The pelvis was irrigated with warm saline.  Individual bleeders were electrocoagulated.  There was a bleeder in the area of the uterosacral stump on the right side.  This was suture ligated and it was thus controlled.  The pelvis was then irrigated.  Operative ite was inspected.  There was no active bleeding. The ureters were seen to be functioning.  At this point we received the pathology report that there was a fibroid there, of course, plus a small focus  of what appeared to be tumor and this was superficial.  There was no evidence of any myometrial invasion.  Frozen section showed areas of atypia, atypical hyperplasia and also well differentiated adenocarcinoma on frozen.  Permanent sections are pending.  The abdomen was then closed with a continuous PDS suture halfway which included peritoneum, muscle and fascia.  This one suture was run halfway from the apex of the incision  cephalad and another one starting caudally and running up to the middle of the incision. The subcutaneous tissues were irrigated.  Individual bleeders were coagulated.  The  skin was then closed with wide skin staples.  A dry sterile dressing was applied. There was clear urine coming from the catheter at the end of the procedure. Estimated blood loss was 150 cc.  None was replaced.  The patient tolerated the  procedure well and was removed to recovery room in good condition. DD:  07/06/99 TD:  07/07/99 Job: [id number]

## 2010-07-23 NOTE — Discharge Summary (Signed)
[name], [name]               ACCOUNT NO.:  [id number]   MEDICAL RECORD NO.:  [id number]          PATIENT TYPE:  INP   LOCATION:  1444                         FACILITY:  [facility]   PHYSICIAN:  Adolph Pollack, M.D.DATE OF BIRTH:  01/30/1946   DATE OF ADMISSION:  05/09/2007  DATE OF DISCHARGE:  05/16/2007                               DISCHARGE SUMMARY   FINAL DIAGNOSES:  Large hiatal hernia.   SECONDARY DIAGNOSES:  1. Hypertension.  2. Acute blood loss anemia.  3. Chronic anticoagulated state, secondary to mechanical aortic valve      replacement.  4. Hyperlipidemia.  5. Left breast cancer.  6. Endometrial cancer.   REASON FOR ADMISSION:  [name] is 65 year old female, who had been  having problems with a symptomatic hiatal hernia.  At least 1/3 of the  stomach is into the chest.  She has a chronic anticoagulated state.  She  was admitted for elective repair of the hiatal hernia.   HOSPITAL COURSE:  She underwent a laparoscopic hiatal hernia, Nissen  fundoplication on May 09, 2007.  Postoperatively, she was placed on  heparin. This was done 24 hours after the end of the operation.  She had  a swallow study on post-op day #1, which showed no leak.  Post-op day  #2, she was complaining of some aching neck pain and also had some  tachycardia and acute blood loss anemia with a hemoglobin 10.1.  A stat  CT of the chest, abdomen and pelvis demonstrated findings consistent  with some blood in the pelvis and a large abdominal wall hematoma, and  the heparin was stopped.  She had abdominal hematoma that progressed  around to her flank, into the left lower quadrant area.  Held while the  heparin until her fifth postoperative day and restarted it slowly.  She  had been on a PCA.  We stopped this.  Basically, she became asymptomatic  with respect to the surgery.  She was tolerating a diet well.  Hemoglobin drifted down to 9.6 and remained there and was stable.  On  the 6th  postoperative day, we started her Coumadin.  I  restarted her  Verapamil and her clonidine.  It was felt that by April 18, 2007, she  could go home on Coumadin and Lovenox shots, and these arrangements were  made and she was discharged.   DISPOSITION:  Discharged to home on May 16, 2007.  She is given  specific dietary instructions and activity restrictions.  She was given  prescription for Lovenox injections and also was to start her Coumadin  and will be seen by Dr. Elease Hashimoto for short-term followup, to have her INR  checked.  I told her to restart all of her pre-operative medications, as  well.  I will see her back in the office in 2 to 3 weeks for followup.      Adolph Pollack, M.D.  Electronically Signed     TJR/MEDQ  D:  05/30/2007  T:  05/30/2007  Job:  [id number]   cc:   Iva Boop, MD,FACG  [facility]  [phone]  [address] [place], [place] [address]   [name], M.D.  Fax: [phone]   Gordy Savers, MD  [address] [place]  [place] [address]   Rose Phi. Myna Hidalgo, M.D.  Fax: [phone]

## 2010-07-23 NOTE — Op Note (Signed)
   NAME:  Amanda Wiggins, Amanda Wiggins                 ACCOUNT NO.:  [id number]   MEDICAL RECORD NO.:  [id number]                   PATIENT TYPE:  INP   LOCATION:  2310                                 FACILITY:  [facility]   PHYSICIAN:  Quita Skye. Krista Blue, M.D.               DATE OF BIRTH:  30-Nov-1945   DATE OF PROCEDURE:  12/31/2001  DATE OF DISCHARGE:                                 OPERATIVE REPORT   PROCEDURE:  Transesophageal echocardiogram.   DIAGNOSIS:  Aortic stenosis.   HISTORY OF PRESENT ILLNESS:  The patient is a 65 year old white female who  presents to the emergency room for aortic valve replacement. Dr. Rexanne Mano requested transesophageal echocardiogram for the intraoperative  management of this case.   DESCRIPTION OF PROCEDURE:  Following the routine cardiac induction, the  mouth guard was carefully inserted into the patient's mouth. A  transesophageal probe was lubricated and carefully inserted into the  patient's esophagus for cardiac imaging.   The initial views demonstrated no evidence of pericardial effusion or  masses. The right atrium appeared free of thrombus or mass.  The intra-  atrial septum showed no evidence of defect. The tricuspid valve appeared  normal in structure with trace regurgitation. The right ventricle appeared  normal in size and contractility. The left atrium was also normal in size.  There was no evidence of thrombus or masses in the atrium or appendage. The  mitral valve had normal appearing leaflets without evidence of regurgitation  or  stenosis. The left ventricle had mild LVH but appeared to have good  contractility without evidence of segmental wall motion abnormalities. The  aortic valve appeared bicuspid in nature with severe aortic stenosis without  evidence of regurgitation.   The patient then underwent aortic valve replacement and following separation  from bypass the prosthetic valve appeared to be functioning normally. There  was no  evidence of perivalvular leaks. There was trace regurgitation, but no  evidence of stenosis at all. The patient separated successfully and did well  post separation.   The transesophageal probe and mouth piece were carefully removed from the  patient.  The patient was taken to the SSE here in good condition.                                                 Quita Skye Krista Blue, M.D.    JDS/MEDQ  D:  12/31/2001  T:  12/31/2001  Job:  [id number]

## 2010-08-18 ENCOUNTER — Ambulatory Visit (INDEPENDENT_AMBULATORY_CARE_PROVIDER_SITE_OTHER): Payer: BC Managed Care – PPO | Admitting: *Deleted

## 2010-08-18 DIAGNOSIS — Z7901 Long term (current) use of anticoagulants: Secondary | ICD-10-CM

## 2010-08-18 DIAGNOSIS — I359 Nonrheumatic aortic valve disorder, unspecified: Secondary | ICD-10-CM

## 2010-08-18 DIAGNOSIS — Z954 Presence of other heart-valve replacement: Secondary | ICD-10-CM

## 2010-08-18 LAB — POCT INR: INR: 2.3

## 2010-08-23 ENCOUNTER — Other Ambulatory Visit: Payer: Self-pay | Admitting: Cardiovascular Disease

## 2010-08-23 NOTE — Telephone Encounter (Signed)
Fax received from pharmacy. Refill completed. Jodette Anaiya Wisinski RN  

## 2010-08-27 ENCOUNTER — Encounter (HOSPITAL_BASED_OUTPATIENT_CLINIC_OR_DEPARTMENT_OTHER): Payer: BC Managed Care – PPO | Admitting: Hematology & Oncology

## 2010-08-27 ENCOUNTER — Other Ambulatory Visit: Payer: Self-pay | Admitting: Hematology & Oncology

## 2010-08-27 DIAGNOSIS — M810 Age-related osteoporosis without current pathological fracture: Secondary | ICD-10-CM

## 2010-08-27 LAB — BASIC METABOLIC PANEL
BUN: 17 mg/dL (ref 6–23)
Chloride: 104 mEq/L (ref 96–112)
Glucose, Bld: 104 mg/dL — ABNORMAL HIGH (ref 70–99)
Potassium: 3.4 mEq/L — ABNORMAL LOW (ref 3.5–5.3)
Sodium: 142 mEq/L (ref 135–145)

## 2010-09-13 ENCOUNTER — Ambulatory Visit (INDEPENDENT_AMBULATORY_CARE_PROVIDER_SITE_OTHER): Payer: BC Managed Care – PPO | Admitting: *Deleted

## 2010-09-13 DIAGNOSIS — I359 Nonrheumatic aortic valve disorder, unspecified: Secondary | ICD-10-CM

## 2010-09-13 DIAGNOSIS — Z7901 Long term (current) use of anticoagulants: Secondary | ICD-10-CM

## 2010-09-13 DIAGNOSIS — Z954 Presence of other heart-valve replacement: Secondary | ICD-10-CM

## 2010-09-14 ENCOUNTER — Other Ambulatory Visit: Payer: Self-pay | Admitting: *Deleted

## 2010-09-15 ENCOUNTER — Encounter: Payer: BC Managed Care – PPO | Admitting: *Deleted

## 2010-10-13 ENCOUNTER — Encounter: Payer: BC Managed Care – PPO | Admitting: *Deleted

## 2010-10-13 ENCOUNTER — Ambulatory Visit (INDEPENDENT_AMBULATORY_CARE_PROVIDER_SITE_OTHER): Payer: BC Managed Care – PPO | Admitting: *Deleted

## 2010-10-13 DIAGNOSIS — I359 Nonrheumatic aortic valve disorder, unspecified: Secondary | ICD-10-CM

## 2010-10-13 LAB — POCT INR: INR: 3

## 2010-11-11 ENCOUNTER — Ambulatory Visit (INDEPENDENT_AMBULATORY_CARE_PROVIDER_SITE_OTHER): Payer: BC Managed Care – PPO | Admitting: *Deleted

## 2010-11-11 ENCOUNTER — Encounter: Payer: BC Managed Care – PPO | Admitting: *Deleted

## 2010-11-11 DIAGNOSIS — Z7901 Long term (current) use of anticoagulants: Secondary | ICD-10-CM

## 2010-11-11 DIAGNOSIS — I359 Nonrheumatic aortic valve disorder, unspecified: Secondary | ICD-10-CM

## 2010-11-11 DIAGNOSIS — Z954 Presence of other heart-valve replacement: Secondary | ICD-10-CM

## 2010-11-22 ENCOUNTER — Other Ambulatory Visit: Payer: Self-pay | Admitting: Gynecology

## 2010-11-26 LAB — DIFFERENTIAL
Basophils Absolute: 0
Basophils Relative: 0
Lymphocytes Relative: 23
Monocytes Absolute: 0.5
Neutro Abs: 3.8
Neutrophils Relative %: 67

## 2010-11-26 LAB — COMPREHENSIVE METABOLIC PANEL
Albumin: 4
Alkaline Phosphatase: 55
BUN: 19
Chloride: 102
Creatinine, Ser: 0.96
Glucose, Bld: 68 — ABNORMAL LOW
Potassium: 4.1
Total Bilirubin: 0.5
Total Protein: 6.8

## 2010-11-26 LAB — CBC
HCT: 41.4
Hemoglobin: 14.2
MCV: 85
Platelets: 286
RDW: 13.3

## 2010-11-27 ENCOUNTER — Encounter: Payer: Self-pay | Admitting: Cardiovascular Disease

## 2010-11-29 LAB — CBC
HCT: 28.4 — ABNORMAL LOW
HCT: 28.4 — ABNORMAL LOW
HCT: 28.6 — ABNORMAL LOW
HCT: 29.4 — ABNORMAL LOW
HCT: 30.7 — ABNORMAL LOW
HCT: 34.8 — ABNORMAL LOW
Hemoglobin: 10.1 — ABNORMAL LOW
Hemoglobin: 11.9 — ABNORMAL LOW
Hemoglobin: 9.6 — ABNORMAL LOW
Hemoglobin: 9.8 — ABNORMAL LOW
Hemoglobin: 9.8 — ABNORMAL LOW
MCHC: 33.7
MCHC: 34.3
MCHC: 34.4
MCHC: 34.4
MCV: 83.6
MCV: 84.8
MCV: 85.2
MCV: 85.5
Platelets: 327
Platelets: 328
Platelets: 335
Platelets: 436 — ABNORMAL HIGH
RBC: 3.34 — ABNORMAL LOW
RBC: 3.41 — ABNORMAL LOW
RBC: 3.44 — ABNORMAL LOW
RBC: 4.11
RDW: 13
RDW: 13.1
RDW: 13.1
RDW: 13.2
RDW: 13.3
RDW: 13.3
WBC: 10.3
WBC: 11.3 — ABNORMAL HIGH
WBC: 6.3
WBC: 7.4

## 2010-11-29 LAB — HEPARIN LEVEL (UNFRACTIONATED)
Heparin Unfractionated: 0.28 — ABNORMAL LOW
Heparin Unfractionated: 0.35
Heparin Unfractionated: 0.46
Heparin Unfractionated: 0.53
Heparin Unfractionated: 0.54
Heparin Unfractionated: <0.1 — ABNORMAL LOW

## 2010-11-29 LAB — ABO/RH: ABO/RH(D): A POS

## 2010-11-29 LAB — CROSSMATCH
ABO/RH(D): A POS
Antibody Screen: NEGATIVE

## 2010-11-29 LAB — APTT: aPTT: 34

## 2010-11-29 LAB — PREPARE RBC (CROSSMATCH)

## 2010-12-02 ENCOUNTER — Encounter: Payer: Self-pay | Admitting: Internal Medicine

## 2010-12-13 ENCOUNTER — Ambulatory Visit (INDEPENDENT_AMBULATORY_CARE_PROVIDER_SITE_OTHER): Payer: BC Managed Care – PPO | Admitting: Cardiovascular Disease

## 2010-12-13 ENCOUNTER — Ambulatory Visit (INDEPENDENT_AMBULATORY_CARE_PROVIDER_SITE_OTHER): Payer: BC Managed Care – PPO | Admitting: *Deleted

## 2010-12-13 ENCOUNTER — Encounter: Payer: Self-pay | Admitting: Cardiovascular Disease

## 2010-12-13 VITALS — BP 130/69 | HR 65 | Ht 61.0 in | Wt 142.4 lb

## 2010-12-13 DIAGNOSIS — I359 Nonrheumatic aortic valve disorder, unspecified: Secondary | ICD-10-CM

## 2010-12-13 DIAGNOSIS — E7800 Pure hypercholesterolemia, unspecified: Secondary | ICD-10-CM

## 2010-12-13 DIAGNOSIS — Z7901 Long term (current) use of anticoagulants: Secondary | ICD-10-CM

## 2010-12-13 DIAGNOSIS — Z954 Presence of other heart-valve replacement: Secondary | ICD-10-CM

## 2010-12-13 DIAGNOSIS — E785 Hyperlipidemia, unspecified: Secondary | ICD-10-CM

## 2010-12-13 NOTE — Assessment & Plan Note (Addendum)
She is doing well.  She has mechanical aortic valve.  INR levels are therautic.  Continue current meds.

## 2010-12-13 NOTE — Assessment & Plan Note (Signed)
She'll continue with her current dose of Crestor. Her lipid levels have been fairly well controlled.

## 2010-12-13 NOTE — Patient Instructions (Signed)
Your physician recommends that you return for lab work in: 1 year  Your physician wants you to follow-up in: one year. You will receive a reminder letter in the mail two months in advance. If you don't receive a letter, please call our office to schedule the follow-up appointment.

## 2010-12-13 NOTE — Progress Notes (Signed)
Amanda Wiggins Date of Birth  11-28-1945 [facility] [address]    [address] [place], [place]  [address] [phone]  Fax  [phone]  History of Present Illness:  Amanda Wiggins is a 65 year old female with a history of aortic stenosis-status post aortic valve replacement.  She also has a history of hypercholesterolemia.  She's not had any episodes of chest pain or shortness of breath.  She's had her INR levels checked in our Coumadin clinic. Her INR levels have all been therapeutic.   Current Outpatient Prescriptions on File Prior to Visit  Medication Sig Dispense Refill  . Calcium Carbonate-Vitamin D (CALCIUM 600+D) 600-400 MG-UNIT per tablet Take 1 tablet by mouth 2 (two) times daily.        . chlorpheniramine-HYDROcodone (TUSSIONEX) 10-8 MG/5ML LQCR       . chlorthalidone (HYGROTON) 25 MG tablet TAKE 1 TABLET DAILY FOR FOUR DAYS PER WEEK  90 tablet  1  . cloNIDine (CATAPRES) 0.1 MG tablet TAKE 1 TABLET EVERY MORNING  90 tablet  3  . CRESTOR 10 MG tablet TAKE 1 TABLET DAILY  90 tablet  3  . cycloSPORINE (RESTASIS) 0.05 % ophthalmic emulsion Place 1 drop into both eyes 3 (three) times daily.        . Flaxseed, Linseed, (FLAX SEED OIL) 1000 MG CAPS Take by mouth daily.        Marland Kitchen letrozole (FEMARA) 2.5 MG tablet Take 2.5 mg by mouth daily.        Marland Kitchen loratadine (CLARITIN) 10 MG tablet Take 10 mg by mouth daily.        . Multiple Vitamin (MULTIVITAMIN) capsule Take 1 capsule by mouth daily.        Marland Kitchen omeprazole (PRILOSEC) 20 MG capsule Take 1 capsule (20 mg total) by mouth daily.  90 capsule  1  . potassium chloride SA (K-DUR,KLOR-CON) 20 MEQ tablet Take 1 tablet (20 mEq total) by mouth daily. Take 4 days per week  90 tablet  6  . senna (SENOKOT) 8.6 MG tablet Take 2 tablets by mouth daily.        . SUMAtriptan (IMITREX) 25 MG tablet Take 1 tablet (25 mg total) by mouth daily as needed.  10 tablet  6  . temazepam (RESTORIL) 30 MG capsule Take 1 capsule (30 mg total) by mouth at bedtime  as needed.  30 capsule  3  . verapamil (CALAN-SR) 180 MG CR tablet TAKE 1 TABLET DAILY  90 tablet  3  . warfarin (COUMADIN) 5 MG tablet TAKE AS DIRECTED BY YOUR PRESCRIBER  90 tablet  1  . zoledronic acid (RECLAST) 5 MG/100ML SOLN Inject 5 mg into the vein. Taking Once a Year        Allergies  Allergen Reactions  . Latex   . Meperidine Hcl   . Sulfamethoxazole     REACTION: unspecified    Past Medical History  Diagnosis Date  . Hyperlipidemia   . Hypertension   . Hx of breast cancer   . GERD (gastroesophageal reflux disease)   . Osteopenia   . Status post aortic valve repair   . FHx: migraine headaches   . Menopausal syndrome   . Aortic valve replaced   . Hiatal hernia     Past Surgical History  Procedure Date  . Lummpectomy breast cancer 2005  . Aortic valve replacement 2003  . Hysterectomy, endometiral cancer 2001  . Right shouler surgery   . Nissan fundoplicaiton 2009    post-op hematoma  on heparin   . Hiatal hernia repair   . Cardiac catheterization     Ejection Fraction     History  Smoking status  . Never Smoker   Smokeless tobacco  . Never Used    History  Alcohol Use  . Yes    socially    Family History  Problem Relation Age of Onset  . Heart attack Mother 81  . Osteoarthritis Brother     hpercholesterolemia    Reviw of Systems:  Reviewed in the HPI.  All other systems are negative.  Physical Exam: Pulse 65  Ht 5\' 1"  (1.549 m) The patient is alert and oriented x 3.  The mood and affect are normal.   Skin: warm and dry.  Color is normal.    HEENT:   the sclera are nonicteric.  The mucous membranes are moist.  The carotids are 2+ without bruits.  There is no thyromegaly.  There is no JVD.    Lungs: clear.  The chest wall is non tender.    Heart: regular rate with a normal S1 and S2.  There are no murmurs, gallops, or rubs. The PMI is not displaced.     Abdomen: good bowel sounds.  There is no guarding or rebound.  There is no  hepatosplenomegaly or tenderness.  There are no masses.   Extremities:  no clubbing, cyanosis, or edema.  The legs are without rashes.  The distal pulses are intact.   Neuro:  Cranial nerves II - XII are intact.  Motor and sensory functions are intact.    The gait is normal.  ECG: NSR. No ST or T wave changes  Assessment / Plan:

## 2010-12-26 ENCOUNTER — Other Ambulatory Visit: Payer: Self-pay | Admitting: Internal Medicine

## 2011-01-17 ENCOUNTER — Encounter: Payer: Self-pay | Admitting: Hematology & Oncology

## 2011-01-17 ENCOUNTER — Other Ambulatory Visit (HOSPITAL_BASED_OUTPATIENT_CLINIC_OR_DEPARTMENT_OTHER): Payer: BC Managed Care – PPO | Admitting: Lab

## 2011-01-17 ENCOUNTER — Other Ambulatory Visit: Payer: Self-pay | Admitting: Hematology & Oncology

## 2011-01-17 ENCOUNTER — Ambulatory Visit (HOSPITAL_BASED_OUTPATIENT_CLINIC_OR_DEPARTMENT_OTHER): Payer: BC Managed Care – PPO | Admitting: Hematology & Oncology

## 2011-01-17 VITALS — BP 120/80 | HR 60 | Temp 98.4°F | Ht 61.0 in | Wt 139.0 lb

## 2011-01-17 DIAGNOSIS — Z170 Estrogen receptor positive status [ER+]: Secondary | ICD-10-CM

## 2011-01-17 DIAGNOSIS — C50919 Malignant neoplasm of unspecified site of unspecified female breast: Secondary | ICD-10-CM

## 2011-01-17 DIAGNOSIS — C50219 Malignant neoplasm of upper-inner quadrant of unspecified female breast: Secondary | ICD-10-CM

## 2011-01-17 DIAGNOSIS — M810 Age-related osteoporosis without current pathological fracture: Secondary | ICD-10-CM

## 2011-01-17 DIAGNOSIS — Z7901 Long term (current) use of anticoagulants: Secondary | ICD-10-CM

## 2011-01-17 LAB — COMPREHENSIVE METABOLIC PANEL
ALT: 28 U/L (ref 0–35)
AST: 28 U/L (ref 0–37)
Alkaline Phosphatase: 46 U/L (ref 39–117)
BUN: 15 mg/dL (ref 6–23)
Calcium: 10 mg/dL (ref 8.4–10.5)
Chloride: 101 mEq/L (ref 96–112)
Creatinine, Ser: 0.81 mg/dL (ref 0.50–1.10)

## 2011-01-17 LAB — CBC WITH DIFFERENTIAL (CANCER CENTER ONLY)
BASO#: 0 10*3/uL (ref 0.0–0.2)
BASO%: 0.2 % (ref 0.0–2.0)
EOS%: 0.7 % (ref 0.0–7.0)
HCT: 41.2 % (ref 34.8–46.6)
LYMPH#: 1.3 10*3/uL (ref 0.9–3.3)
MCH: 28.8 pg (ref 26.0–34.0)
MCHC: 34.2 g/dL (ref 32.0–36.0)
MONO%: 5.8 % (ref 0.0–13.0)
NEUT#: 3.9 10*3/uL (ref 1.5–6.5)
NEUT%: 70.1 % (ref 39.6–80.0)
RDW: 13.2 % (ref 11.1–15.7)

## 2011-01-17 MED ORDER — ZOLEDRONIC ACID 4 MG/5ML IV CONC: 4.0000 mg | Freq: Once | INTRAVENOUS | Status: DC

## 2011-01-17 NOTE — Progress Notes (Signed)
MRN: [id number] Female, 65 y.o.  CSN: [id number] This office note has been dictated.

## 2011-01-18 ENCOUNTER — Telehealth: Payer: Self-pay | Admitting: Hematology & Oncology

## 2011-01-18 NOTE — Telephone Encounter (Signed)
Pt aware of 5-6 appointment °

## 2011-01-18 NOTE — Progress Notes (Signed)
CC:   Gordy Savers, MD [name], M.D. Currie Paris, M.D. [name], M.D.  DIAGNOSES: 1. Stage I (T1 N0 M0) infiltrating ductal carcinoma of the left     breast. 2. Mechanical heart valve on chronic anticoagulation.  CURRENT THERAPY: 1. Femara 2.5 mg p.o. daily. 2. Coumadin, lifelong.  INTERIM HISTORY:  Ms. Clemence comes for followup.  She is doing okay.  We last saw her back in May.  She did okay over the summertime.  She just got back from [place].  She says she was out shopping with, I think, a sister and friend.  She has not had any problems with bleeding or bruising.  She is on Coumadin.  This being monitored by Dr. Elease Hashimoto.  Her father apparently fell recently.  He did not break anything, which is a blessing.  She has had a good appetite.  There has been no change in bowel or bladder habits.  She has had no nausea or vomiting.  There has been no cough or shortness of breath.  There has been no leg swelling.  PHYSICAL EXAM:  General:  This is a well-developed, well-nourished white female in no obvious distress.  Vital signs:  98.4, pulse 60, respiratory rate 18, blood pressure 120/80, weight is 139.  Head and neck:  Shows a normocephalic, atraumatic skull.  There is no scleral icterus.  There are no oral lesions.  She has no adenopathy in her neck. Pupils react appropriately.  Lungs:  Clear bilaterally.  Cardiac: Regular rate and rhythm with normal S1, S2.  She does have occasional pause.  She has systolic click secondary to her mechanical heart valve. Breasts:  Shows right breast with no masses, edema or erythema.  There is no right axillary adenopathy.  Left breast shows well-healed lumpectomy at the 12 o'clock position.  She has a slight contraction of the left breast.  No distinct mass noted in the left breast.  There is no left axillary adenopathy.  Abdomen:  Soft with good bowel sounds. There is no palpable abdominal mass.  There is  no fluid wave.  There is no palpable hepatosplenomegaly.  Back:  No tenderness over the spine, ribs, or hips.  She has no kyphosis or osteoporotic changes. Extremities:  Show no clubbing, cyanosis or edema.  She has good range of motion of her joints.  LABORATORY STUDIES:  White cell count is 5.5, hemoglobin 14, hematocrit 41, platelet count is 277.  IMPRESSION:  Ms. Deiss is a 65 year old white female with a history of stage I infiltrating ductal carcinoma of the left breast.  She is on Femara.  She will complete her Femara at the end of November.  She is having a tough time trying to feel comfortable being off the Femara.  I believe that at this point in time, she has risk of recurrence that is easily less than 10%.  I do not see any added benefit to keeping her on Femara.  From, we will plan to get her back in another 6 months.  When we see her back in May 2013, she will get Reclast which I feel is helpful for her.    ______________________________ Josph Macho, M.D. PRE/MEDQ  D:  01/17/2011  T:  01/17/2011  Job:  [id number]

## 2011-01-18 NOTE — Telephone Encounter (Signed)
Sent Linda high priority message with 5-6 reclast appointment

## 2011-01-19 ENCOUNTER — Encounter (INDEPENDENT_AMBULATORY_CARE_PROVIDER_SITE_OTHER): Payer: Self-pay | Admitting: Surgery

## 2011-01-24 ENCOUNTER — Encounter: Payer: BC Managed Care – PPO | Admitting: *Deleted

## 2011-01-25 ENCOUNTER — Ambulatory Visit (INDEPENDENT_AMBULATORY_CARE_PROVIDER_SITE_OTHER): Payer: BC Managed Care – PPO | Admitting: *Deleted

## 2011-01-25 ENCOUNTER — Other Ambulatory Visit: Payer: Self-pay | Admitting: *Deleted

## 2011-01-25 DIAGNOSIS — Z954 Presence of other heart-valve replacement: Secondary | ICD-10-CM

## 2011-01-25 DIAGNOSIS — Z7901 Long term (current) use of anticoagulants: Secondary | ICD-10-CM

## 2011-01-25 DIAGNOSIS — C50919 Malignant neoplasm of unspecified site of unspecified female breast: Secondary | ICD-10-CM

## 2011-01-25 DIAGNOSIS — Z952 Presence of prosthetic heart valve: Secondary | ICD-10-CM | POA: Insufficient documentation

## 2011-01-25 DIAGNOSIS — I359 Nonrheumatic aortic valve disorder, unspecified: Secondary | ICD-10-CM

## 2011-01-25 MED ORDER — LETROZOLE 2.5 MG PO TABS: 2.5000 mg | ORAL_TABLET | Freq: Every day | ORAL | Status: DC

## 2011-01-25 MED ORDER — TEMAZEPAM 30 MG PO CAPS: 30.0000 mg | ORAL_CAPSULE | Freq: Every evening | ORAL | Status: DC | PRN

## 2011-02-03 ENCOUNTER — Other Ambulatory Visit: Payer: Self-pay | Admitting: Cardiovascular Disease

## 2011-02-22 ENCOUNTER — Encounter: Payer: Self-pay | Admitting: Internal Medicine

## 2011-02-22 ENCOUNTER — Ambulatory Visit (INDEPENDENT_AMBULATORY_CARE_PROVIDER_SITE_OTHER): Payer: BC Managed Care – PPO | Admitting: Internal Medicine

## 2011-02-22 DIAGNOSIS — Z954 Presence of other heart-valve replacement: Secondary | ICD-10-CM

## 2011-02-22 DIAGNOSIS — Z952 Presence of prosthetic heart valve: Secondary | ICD-10-CM

## 2011-02-22 DIAGNOSIS — I10 Essential (primary) hypertension: Secondary | ICD-10-CM

## 2011-02-22 MED ORDER — HYDROCOD POLST-CHLORPHEN POLST 10-8 MG/5ML PO LQCR: 5.0000 mL | Freq: Two times a day (BID) | ORAL | Status: DC | PRN

## 2011-02-22 NOTE — Patient Instructions (Signed)
Get plenty of rest, Drink lots of  clear liquids, and use Tylenol for fever and discomfort.    Call or return to clinic prn if these symptoms worsen or fail to improve as anticipated.

## 2011-02-22 NOTE — Progress Notes (Signed)
  Subjective:    Patient ID: Amanda Wiggins, female    DOB: 05-26-1945, 65 y.o.   MRN: [id number]  HPI  65 year old patient status post aortic valve repair on chronic Coumadin anticoagulation. She presents with a six-day history of nonproductive cough. She has had some mild sore throat and myalgias. She has treated hypertension and also history of breast cancer. No fever chills purulent sputum production or shortness of breath.    Review of Systems  Constitutional: Positive for fatigue. Negative for fever and chills.  HENT: Positive for sore throat. Negative for hearing loss, congestion, rhinorrhea, dental problem, sinus pressure and tinnitus.   Eyes: Negative for pain, discharge and visual disturbance.  Respiratory: Positive for cough (nonproductive). Negative for shortness of breath.   Cardiovascular: Negative for chest pain, palpitations and leg swelling.  Gastrointestinal: Negative for nausea, vomiting, abdominal pain, diarrhea, constipation, blood in stool and abdominal distention.  Genitourinary: Negative for dysuria, urgency, frequency, hematuria, flank pain, vaginal bleeding, vaginal discharge, difficulty urinating, vaginal pain and pelvic pain.  Musculoskeletal: Negative for joint swelling, arthralgias and gait problem.  Skin: Negative for rash.  Neurological: Negative for dizziness, syncope, speech difficulty, weakness, numbness and headaches.  Hematological: Negative for adenopathy.  Psychiatric/Behavioral: Negative for behavioral problems, dysphoric mood and agitation. The patient is not nervous/anxious.        Objective:   Physical Exam  Constitutional: She is oriented to person, place, and time. She appears well-developed and well-nourished.  HENT:  Head: Normocephalic.  Right Ear: External ear normal.  Left Ear: External ear normal.  Mouth/Throat: Oropharynx is clear and moist.  Eyes: Conjunctivae and EOM are normal. Pupils are equal, round, and reactive to light.    Neck: Normal range of motion. Neck supple. No thyromegaly present.  Cardiovascular: Normal rate, regular rhythm, normal heart sounds and intact distal pulses.   Pulmonary/Chest: Effort normal and breath sounds normal. No respiratory distress. She has no wheezes. She has no rales.       Normal O2 saturation  Abdominal: Soft. Bowel sounds are normal. She exhibits no mass. There is no tenderness.  Musculoskeletal: Normal range of motion.  Lymphadenopathy:    She has no cervical adenopathy.  Neurological: She is alert and oriented to person, place, and time.  Skin: Skin is warm and dry. No rash noted.  Psychiatric: She has a normal mood and affect. Her behavior is normal.          Assessment & Plan:    Viral URI. Will treat symptomatically. Anti-tussive medication refilled Hypertension stable. We'll taper and discontinue clonidine which was prescribed for prevention of hot flashes

## 2011-03-09 ENCOUNTER — Ambulatory Visit (INDEPENDENT_AMBULATORY_CARE_PROVIDER_SITE_OTHER): Payer: BC Managed Care – PPO | Admitting: *Deleted

## 2011-03-09 DIAGNOSIS — Z7901 Long term (current) use of anticoagulants: Secondary | ICD-10-CM

## 2011-03-09 DIAGNOSIS — I359 Nonrheumatic aortic valve disorder, unspecified: Secondary | ICD-10-CM

## 2011-03-09 DIAGNOSIS — Z954 Presence of other heart-valve replacement: Secondary | ICD-10-CM

## 2011-03-23 ENCOUNTER — Other Ambulatory Visit: Payer: Self-pay | Admitting: Gynecology

## 2011-03-23 DIAGNOSIS — Z1231 Encounter for screening mammogram for malignant neoplasm of breast: Secondary | ICD-10-CM

## 2011-04-20 ENCOUNTER — Encounter: Payer: BC Managed Care – PPO | Admitting: *Deleted

## 2011-04-21 ENCOUNTER — Ambulatory Visit (INDEPENDENT_AMBULATORY_CARE_PROVIDER_SITE_OTHER): Payer: BC Managed Care – PPO | Admitting: *Deleted

## 2011-04-21 ENCOUNTER — Ambulatory Visit
Admission: RE | Admit: 2011-04-21 | Discharge: 2011-04-21 | Disposition: A | Discharge status: Home / Self Care | Payer: BC Managed Care – PPO | Source: Ambulatory Visit | Attending: Gynecology | Admitting: Gynecology

## 2011-04-21 DIAGNOSIS — Z7901 Long term (current) use of anticoagulants: Secondary | ICD-10-CM

## 2011-04-21 DIAGNOSIS — Z1231 Encounter for screening mammogram for malignant neoplasm of breast: Secondary | ICD-10-CM

## 2011-04-21 DIAGNOSIS — Z954 Presence of other heart-valve replacement: Secondary | ICD-10-CM

## 2011-04-21 DIAGNOSIS — I359 Nonrheumatic aortic valve disorder, unspecified: Secondary | ICD-10-CM

## 2011-04-21 NOTE — Progress Notes (Signed)
I cannot close this note. Thanks, chris

## 2011-04-22 ENCOUNTER — Telehealth: Payer: Self-pay | Admitting: *Deleted

## 2011-04-22 NOTE — Telephone Encounter (Signed)
Please call pt to have her INR added to her March 21 labs at her Cardiologist.  Any questions, please call her.

## 2011-04-22 NOTE — Telephone Encounter (Signed)
Will ok INR here with cpx labs to be done - coag clinic is aware and will follow to adjust if needed. kik

## 2011-05-26 ENCOUNTER — Other Ambulatory Visit (INDEPENDENT_AMBULATORY_CARE_PROVIDER_SITE_OTHER): Payer: BC Managed Care – PPO

## 2011-05-26 DIAGNOSIS — I359 Nonrheumatic aortic valve disorder, unspecified: Secondary | ICD-10-CM

## 2011-05-26 DIAGNOSIS — Z7901 Long term (current) use of anticoagulants: Secondary | ICD-10-CM

## 2011-05-26 DIAGNOSIS — Z0000 Encounter for general adult medical examination without abnormal findings: Secondary | ICD-10-CM

## 2011-05-26 DIAGNOSIS — Z954 Presence of other heart-valve replacement: Secondary | ICD-10-CM

## 2011-05-26 LAB — CBC WITH DIFFERENTIAL/PLATELET
Basophils Relative: 0.4 % (ref 0.0–3.0)
Eosinophils Absolute: 0.1 10*3/uL (ref 0.0–0.7)
HCT: 42.7 % (ref 36.0–46.0)
Hemoglobin: 14.1 g/dL (ref 12.0–15.0)
Lymphocytes Relative: 25.4 % (ref 12.0–46.0)
Lymphs Abs: 1.4 10*3/uL (ref 0.7–4.0)
MCHC: 33.2 g/dL (ref 30.0–36.0)
MCV: 87.1 fl (ref 78.0–100.0)
Monocytes Absolute: 0.5 10*3/uL (ref 0.1–1.0)
Neutro Abs: 3.6 10*3/uL (ref 1.4–7.7)
RBC: 4.9 Mil/uL (ref 3.87–5.11)

## 2011-05-26 LAB — LIPID PANEL
Cholesterol: 172 mg/dL (ref 0–200)
HDL: 62.5 mg/dL (ref >39.00)

## 2011-05-26 LAB — HEPATIC FUNCTION PANEL
Albumin: 4.3 g/dL (ref 3.5–5.2)
Alkaline Phosphatase: 52 U/L (ref 39–117)
Total Protein: 7.3 g/dL (ref 6.0–8.3)

## 2011-05-26 LAB — BASIC METABOLIC PANEL
CO2: 31 mEq/L (ref 19–32)
Chloride: 98 mEq/L (ref 96–112)
Glucose, Bld: 83 mg/dL (ref 70–99)
Potassium: 3.5 mEq/L (ref 3.5–5.1)
Sodium: 140 mEq/L (ref 135–145)

## 2011-05-26 LAB — POCT URINALYSIS DIPSTICK
Bilirubin, UA: NEGATIVE
Blood, UA: NEGATIVE
Glucose, UA: NEGATIVE
Nitrite, UA: NEGATIVE

## 2011-05-26 LAB — TSH: TSH: 1.72 u[IU]/mL (ref 0.35–5.50)

## 2011-05-26 NOTE — Patient Instructions (Signed)
  Latest dosing instructions   Total Sun Mon Tue Wed Thu Fri Sat   30 5 mg 2.5 mg 5 mg 5 mg 2.5 mg 5 mg 5 mg    (5 mg1) (5 mg0.5) (5 mg1) (5 mg1) (5 mg0.5) (5 mg1) (5 mg1)        

## 2011-05-27 ENCOUNTER — Ambulatory Visit: Payer: Self-pay | Admitting: Internal Medicine

## 2011-05-27 DIAGNOSIS — I359 Nonrheumatic aortic valve disorder, unspecified: Secondary | ICD-10-CM

## 2011-05-27 DIAGNOSIS — Z7901 Long term (current) use of anticoagulants: Secondary | ICD-10-CM

## 2011-06-02 ENCOUNTER — Ambulatory Visit (INDEPENDENT_AMBULATORY_CARE_PROVIDER_SITE_OTHER): Payer: BC Managed Care – PPO | Admitting: Internal Medicine

## 2011-06-02 ENCOUNTER — Encounter: Payer: Self-pay | Admitting: Internal Medicine

## 2011-06-02 VITALS — BP 120/80 | HR 70 | Temp 97.5°F | Resp 16 | Ht 61.0 in | Wt 140.0 lb

## 2011-06-02 DIAGNOSIS — I10 Essential (primary) hypertension: Secondary | ICD-10-CM

## 2011-06-02 DIAGNOSIS — Z0000 Encounter for general adult medical examination without abnormal findings: Secondary | ICD-10-CM

## 2011-06-02 DIAGNOSIS — Z954 Presence of other heart-valve replacement: Secondary | ICD-10-CM

## 2011-06-02 DIAGNOSIS — Z952 Presence of prosthetic heart valve: Secondary | ICD-10-CM

## 2011-06-02 NOTE — Patient Instructions (Signed)
Limit your sodium (Salt) intake    It is important that you exercise regularly, at least 20 minutes 3 to 4 times per week.  If you develop chest pain or shortness of breath seek  medical attention.  Please check your blood pressure on a regular basis.  If it is consistently greater than 150/90, please make an office appointment.  Return in one year for follow-up  

## 2011-06-02 NOTE — Progress Notes (Signed)
Subjective:    Patient ID: Amanda Wiggins, female    DOB: 04/25/1945, 66 y.o.   MRN: [id number]  HPI    Review of Systems     Objective:   Physical Exam        Assessment & Plan:   Subjective:    Patient ID: Amanda Wiggins, female    DOB: 1945/09/26, 66 y.o.   MRN: [id number]  HPI   A 66 year old patient who is seen today for a health maintenance examination. She is followed by multiple consultants including cardiology oncology OB/GYN general surgery and ENT. She is doing reasonably well. She is now  Approximately  8 years out from aortic valve replacement surgery.   Vitals Entered By: Duard Brady LPN (May 25, 2009 1:15 PM)  Primary Care Provider: Eleonore Chiquito MD  History of Present Illness:  66 year-old patient who is seen today for a wellness exam. she is followed by multiple specialists, including cardiology OB/GYN general surgery oncology. She is doing quite well. Two status post prosthetic aortic valve repair on chronic Coumadin. She has a history of Gastrosoft reflux disease and is status post Nissan fundoplication. She has hypertension and dyslipidemia, which have been stable. No concerns or complaints. She is scheduled for cardiology follow up later today.  Allergies:  1) ! Demerol  2) Sulfamethoxazole (Sulfamethoxazole)  Past History:  Past Medical History:  Hyperlipidemia  Hypertension  Breast cancer, hx of  GERD  Osteopenia  status post aortic valve repair  migraine headaches  menopausal syndrome  Past Surgical History:  Lumpectomy breast cancer 2005  Aortic Valve Replacement: 2003  Hysterectomy,endometrial cancer 2001  Right shoulder surgery  Nissan fundoplication 2009, post-op hematoma on heparin  colonoscopy 11-08  Family History:  Reviewed history from 09/12/2007 and no changes required.  father status post aortic valve surgery  mother status post MI, age 79  One brother, history of hypercholesterolemia, osteoarthritis  one sister   No FH of Colon Cancer  Social History:  Reviewed history from 09/12/2007 and no changes required.  married,  Patient has never smoked.  Alcohol Use - socially  Occupation: Retired  Illicit Drug Use - no  Patient gets regular exercise.   Past Medical History  Diagnosis Date  . Hyperlipidemia   . Hypertension   . Hx of breast cancer   . GERD (gastroesophageal reflux disease)   . Osteopenia   . Status post aortic valve repair   . FHx: migraine headaches   . Menopausal syndrome    Past Surgical History  Procedure Date  . Lummpectomy breast cancer 2005  . Aortic valve replacement 2003  . Hysterectomy, endometiral cancer 2001  . Right shouler surgery   . Nissan fundoplicaiton 2009    post-op hematoma on heparin     reports that she has never smoked. She has never used smokeless tobacco. She reports that she drinks alcohol. She reports that she does not use illicit drugs. family history includes Heart attack (age of onset:59) in her mother and Osteoarthritis in her brother. Allergies  Allergen Reactions  . Meperidine Hcl   . Sulfamethoxazole     REACTION: unspecified    the in in in in a a   Review of Systems  Constitutional: Negative.  Negative for fever, appetite change, fatigue and unexpected weight change.  HENT: Negative for hearing loss, ear pain, nosebleeds, congestion, sore throat, rhinorrhea, mouth sores, trouble swallowing, neck stiffness, dental problem, voice change, sinus pressure and tinnitus.   Eyes: Negative for photophobia,  pain, discharge, redness and visual disturbance.  Respiratory: Negative for cough, chest tightness and shortness of breath.   Cardiovascular: Negative for chest pain, palpitations and leg swelling.  Gastrointestinal: Negative for nausea, vomiting, abdominal pain, diarrhea, constipation, blood in stool, abdominal distention and rectal pain.  Genitourinary: Negative for dysuria, urgency, frequency, hematuria, flank pain, vaginal bleeding,  vaginal discharge, difficulty urinating, genital sores, vaginal pain, menstrual problem and pelvic pain.  Musculoskeletal: Negative for back pain, joint swelling, arthralgias and gait problem.  Skin: Negative for rash.  Neurological: Negative for dizziness, syncope, speech difficulty, weakness, light-headedness, numbness and headaches.  Hematological: Negative for adenopathy. Does not bruise/bleed easily.  Psychiatric/Behavioral: Negative for suicidal ideas, behavioral problems, self-injury, dysphoric mood and agitation. The patient is not nervous/anxious.        Objective:   Physical Exam  Constitutional: She is oriented to person, place, and time. She appears well-developed and well-nourished.  HENT:  Head: Normocephalic and atraumatic.  Right Ear: External ear normal.  Left Ear: External ear normal.  Mouth/Throat: Oropharynx is clear and moist.  Eyes: Conjunctivae and EOM are normal.  Neck: Normal range of motion. Neck supple. No JVD present. No thyromegaly present.  Cardiovascular: Normal rate, regular rhythm and intact distal pulses.   No murmur heard.       Prosthetic heart sounds. Sternotomy scar. Grade 1/6 systolic murmur  Pulmonary/Chest: Effort normal and breath sounds normal. She has no wheezes. She has no rales.  Abdominal: Soft. Bowel sounds are normal. She exhibits no distension and no mass. There is no tenderness. There is no rebound and no guarding.  Genitourinary: Vagina normal.  Musculoskeletal: Normal range of motion. She exhibits no edema and no tenderness.  Neurological: She is alert and oriented to person, place, and time. She has normal reflexes. No cranial nerve deficit. She exhibits normal muscle tone. Coordination normal.  Skin: Skin is warm and dry. No rash noted.  Psychiatric: She has a normal mood and affect. Her behavior is normal.          Assessment & Plan:   annual clinical examination. We'll continue follow up with cardiology oncology OB/GYN. A  mammogram was performed last month. She is for redo colonoscopy in approximately 7 years. Laboratory studies were reviewed

## 2011-06-19 ENCOUNTER — Other Ambulatory Visit: Payer: Self-pay | Admitting: Internal Medicine

## 2011-07-07 ENCOUNTER — Ambulatory Visit (INDEPENDENT_AMBULATORY_CARE_PROVIDER_SITE_OTHER): Payer: BC Managed Care – PPO | Admitting: *Deleted

## 2011-07-07 DIAGNOSIS — I359 Nonrheumatic aortic valve disorder, unspecified: Secondary | ICD-10-CM

## 2011-07-07 DIAGNOSIS — Z7901 Long term (current) use of anticoagulants: Secondary | ICD-10-CM

## 2011-07-07 LAB — POCT INR: INR: 2.3

## 2011-07-11 ENCOUNTER — Ambulatory Visit (HOSPITAL_BASED_OUTPATIENT_CLINIC_OR_DEPARTMENT_OTHER): Payer: BC Managed Care – PPO | Admitting: Hematology & Oncology

## 2011-07-11 ENCOUNTER — Ambulatory Visit: Payer: BC Managed Care – PPO

## 2011-07-11 ENCOUNTER — Other Ambulatory Visit (HOSPITAL_BASED_OUTPATIENT_CLINIC_OR_DEPARTMENT_OTHER): Payer: BC Managed Care – PPO | Admitting: Lab

## 2011-07-11 VITALS — BP 132/84 | HR 68 | Temp 97.3°F | Ht 61.0 in | Wt 139.0 lb

## 2011-07-11 DIAGNOSIS — Z853 Personal history of malignant neoplasm of breast: Secondary | ICD-10-CM

## 2011-07-11 DIAGNOSIS — C50919 Malignant neoplasm of unspecified site of unspecified female breast: Secondary | ICD-10-CM

## 2011-07-11 DIAGNOSIS — Z7901 Long term (current) use of anticoagulants: Secondary | ICD-10-CM

## 2011-07-11 DIAGNOSIS — G4701 Insomnia due to medical condition: Secondary | ICD-10-CM

## 2011-07-11 LAB — CBC WITH DIFFERENTIAL (CANCER CENTER ONLY)
BASO#: 0 10*3/uL (ref 0.0–0.2)
BASO%: 0.2 % (ref 0.0–2.0)
EOS%: 1.6 % (ref 0.0–7.0)
HCT: 43.4 % (ref 34.8–46.6)
HGB: 14.6 g/dL (ref 11.6–15.9)
LYMPH#: 1.4 10*3/uL (ref 0.9–3.3)
LYMPH%: 24.1 % (ref 14.0–48.0)
MCH: 28.9 pg (ref 26.0–34.0)
MCHC: 33.6 g/dL (ref 32.0–36.0)
MONO%: 6.9 % (ref 0.0–13.0)
NEUT%: 67.2 % (ref 39.6–80.0)
RDW: 13 % (ref 11.1–15.7)

## 2011-07-11 MED ORDER — TEMAZEPAM 30 MG PO CAPS: 30.0000 mg | ORAL_CAPSULE | Freq: Every evening | ORAL | Status: DC | PRN

## 2011-07-11 NOTE — Progress Notes (Signed)
This office note has been dictated.

## 2011-07-12 LAB — COMPREHENSIVE METABOLIC PANEL
ALT: 32 U/L (ref 0–35)
AST: 35 U/L (ref 0–37)
Alkaline Phosphatase: 55 U/L (ref 39–117)
BUN: 17 mg/dL (ref 6–23)
Calcium: 9.6 mg/dL (ref 8.4–10.5)
Chloride: 102 mEq/L (ref 96–112)
Creatinine, Ser: 0.88 mg/dL (ref 0.50–1.10)
Total Bilirubin: 0.4 mg/dL (ref 0.3–1.2)

## 2011-07-12 NOTE — Progress Notes (Signed)
CC:   Gordy Savers, MD [name], M.D. [name], M.D.  DIAGNOSES: 1. Stage I (T1 N0 M0) ductal carcinoma of the left breast. 2. Mechanical heart valve.  CURRENT THERAPY:  Lifelong Coumadin.  INTERVAL HISTORY:  Ms. Minardi comes in for followup.  She is now off Femara.  We got her off Femara last year.  She completed this at the end of November.  She is doing okay otherwise.  She has had no problems since we last saw her.  She continues on her multiple medications.  She has had no issues with the Coumadin.  The patient says her hot flashes are not as bad since she came off the Femara.  She got herself off the clonidine.  She has had no problems with fevers, sweats or chills.  Her last mammogram was in February.  Everything looked fine with the mammogram with no suspicious findings.  She has had no swelling in her legs.  There have been no rashes.  She has had no cough.  She has had no headache.  There has been no double vision or blurred vision.  PHYSICAL EXAM:  This is a well-developed, well-nourished white female in no obvious distress.  Vital signs:  Temperature of 97.3, pulse 68, respiratory rate 18, blood pressure 132/84, weight is 139.  Head/Neck: A normocephalic, atraumatic skull.  There are no ocular or oral lesions. There are no palpable cervical or supraclavicular lymph nodes.  Lungs: Clear bilaterally.  Cardiac:  Regular rate and rhythm with a normal S1, S2.  She has a systolic click from her mechanical heart valve.  Breasts: Right breast with no masses, edema or erythema.  There is no right axillary adenopathy.  Left breast shows well-healed lumpectomy at the 12 o'clock position.  There is a slight contraction of the left breast from radiation surgery.  No obvious masses noted in the left breast.  There is no left axillary adenopathy.  Abdomen:  Soft with good bowel sounds. There is no palpable abdominal mass.  There is no fluid wave.  There  is no palpable hepatosplenomegaly.  Back:  No tenderness over the spine, ribs, or hips.  Extremities:  No clubbing, cyanosis or edema. Neurological:  No focal neurological deficits.  LABORATORY STUDIES:  White cell count is 5.7, hemoglobin 14.6, hematocrit 43.4, platelet count 276.  IMPRESSION:  Ms. Armbrister is a 66 year old postmenopausal female with a history of stage I ductal carcinoma of the left breast.  She presented in June 2005.  She had ER positive disease.  She was node negative.  She did not require any chemotherapy.  Again, I think that her risk of recurrence is going to be less than 10%.  Will plan to get her back to see [place] in another 6 months.  I do not see a need for any x-rays or lab work in between visits.  Addendum:  I forgot to mention that she does get Reclast once a year. She is due for this in June.    ______________________________ Josph Macho, M.D. PRE/MEDQ  D:  07/11/2011  T:  07/12/2011  Job:  [id number]

## 2011-07-13 ENCOUNTER — Telehealth: Payer: Self-pay | Admitting: *Deleted

## 2011-07-13 NOTE — Telephone Encounter (Signed)
Message copied by Anselm Jungling on Wed Jul 13, 2011  2:56 PM ------      Message from: Arlan Organ R      Created: Mon Jul 11, 2011  8:59 PM       Call - labs are ok.  pete

## 2011-07-13 NOTE — Telephone Encounter (Signed)
Called patient to let her know that her labwork was ok per dr. ennever 

## 2011-07-18 ENCOUNTER — Telehealth: Payer: Self-pay | Admitting: *Deleted

## 2011-07-18 NOTE — Telephone Encounter (Signed)
Called patient to let her know that her labs and vitamin d levels were great per dr. ennever 

## 2011-07-18 NOTE — Telephone Encounter (Signed)
Message copied by Anselm Jungling on Mon Jul 18, 2011  9:35 AM ------      Message from: Josph Macho      Created: Fri Jul 15, 2011  7:43 AM       Call - labs and vit d are great!!  pete

## 2011-08-02 ENCOUNTER — Other Ambulatory Visit: Payer: Self-pay | Admitting: Internal Medicine

## 2011-08-04 NOTE — Telephone Encounter (Signed)
Wrong chart opened

## 2011-08-16 ENCOUNTER — Ambulatory Visit (INDEPENDENT_AMBULATORY_CARE_PROVIDER_SITE_OTHER): Payer: BC Managed Care – PPO | Admitting: *Deleted

## 2011-08-16 DIAGNOSIS — Z7901 Long term (current) use of anticoagulants: Secondary | ICD-10-CM

## 2011-08-16 DIAGNOSIS — I359 Nonrheumatic aortic valve disorder, unspecified: Secondary | ICD-10-CM

## 2011-08-22 ENCOUNTER — Ambulatory Visit (HOSPITAL_BASED_OUTPATIENT_CLINIC_OR_DEPARTMENT_OTHER): Payer: BC Managed Care – PPO

## 2011-08-22 VITALS — BP 142/76 | HR 72 | Temp 98.6°F | Ht 61.0 in | Wt 135.0 lb

## 2011-08-22 DIAGNOSIS — M818 Other osteoporosis without current pathological fracture: Secondary | ICD-10-CM

## 2011-08-22 DIAGNOSIS — Z853 Personal history of malignant neoplasm of breast: Secondary | ICD-10-CM

## 2011-08-22 DIAGNOSIS — M810 Age-related osteoporosis without current pathological fracture: Secondary | ICD-10-CM

## 2011-08-22 HISTORY — DX: Other osteoporosis without current pathological fracture: M81.8

## 2011-08-22 MED ORDER — ZOLEDRONIC ACID 4 MG/100ML IV SOLN
4.0000 mg | Freq: Once | INTRAVENOUS | Status: AC
  Administered 2011-08-22: 4 mg via INTRAVENOUS
  Filled 2011-08-22: qty 100

## 2011-08-22 NOTE — Patient Instructions (Signed)
Zoledronic Acid injection (Hypercalcemia, Oncology) What is this medicine? ZOLEDRONIC ACID (ZOE le dron ik AS id) lowers the amount of calcium loss from bone. It is used to treat too much calcium in your blood from cancer. It is also used to prevent complications of cancer that has spread to the bone. This medicine may be used for other purposes; ask your health care provider or pharmacist if you have questions. What should I tell my health care provider before I take this medicine? They need to know if you have any of these conditions: -aspirin-sensitive asthma -dental disease -kidney disease -an unusual or allergic reaction to zoledronic acid, other medicines, foods, dyes, or preservatives -pregnant or trying to get pregnant -breast-feeding How should I use this medicine? This medicine is for infusion into a vein. It is given by a health care professional in a hospital or clinic setting. Talk to your pediatrician regarding the use of this medicine in children. Special care may be needed. Overdosage: If you think you have taken too much of this medicine contact a poison control center or emergency room at once. NOTE: This medicine is only for you. Do not share this medicine with others. What if I miss a dose? It is important not to miss your dose. Call your doctor or health care professional if you are unable to keep an appointment. What may interact with this medicine? -certain antibiotics given by injection -NSAIDs, medicines for pain and inflammation, like ibuprofen or naproxen -some diuretics like bumetanide, furosemide -teriparatide -thalidomide This list may not describe all possible interactions. Give your health care provider a list of all the medicines, herbs, non-prescription drugs, or dietary supplements you use. Also tell them if you smoke, drink alcohol, or use illegal drugs. Some items may interact with your medicine. What should I watch for while using this medicine? Visit  your doctor or health care professional for regular checkups. It may be some time before you see the benefit from this medicine. Do not stop taking your medicine unless your doctor tells you to. Your doctor may order blood tests or other tests to see how you are doing. Women should inform their doctor if they wish to become pregnant or think they might be pregnant. There is a potential for serious side effects to an unborn child. Talk to your health care professional or pharmacist for more information. You should make sure that you get enough calcium and vitamin D while you are taking this medicine. Discuss the foods you eat and the vitamins you take with your health care professional. Some people who take this medicine have severe bone, joint, and/or muscle pain. This medicine may also increase your risk for a broken thigh bone. Tell your doctor right away if you have pain in your upper leg or groin. Tell your doctor if you have any pain that does not go away or that gets worse. What side effects may I notice from receiving this medicine? Side effects that you should report to your doctor or health care professional as soon as possible: -allergic reactions like skin rash, itching or hives, swelling of the face, lips, or tongue -anxiety, confusion, or depression -breathing problems -changes in vision -feeling faint or lightheaded, falls -jaw burning, cramping, pain -muscle cramps, stiffness, or weakness -trouble passing urine or change in the amount of urine Side effects that usually do not require medical attention (report to your doctor or health care professional if they continue or are bothersome): -bone, joint, or muscle pain -  fever -hair loss -irritation at site where injected -loss of appetite -nausea, vomiting -stomach upset -tired This list may not describe all possible side effects. Call your doctor for medical advice about side effects. You may report side effects to FDA at  1-800-FDA-1088. Where should I keep my medicine? This drug is given in a hospital or clinic and will not be stored at home. NOTE: This sheet is a summary. It may not cover all possible information. If you have questions about this medicine, talk to your doctor, pharmacist, or health care provider.  2012, [employer]vier/Gold Standard. (08/20/2010 9:06:58 AM) 

## 2011-09-23 ENCOUNTER — Ambulatory Visit (INDEPENDENT_AMBULATORY_CARE_PROVIDER_SITE_OTHER): Payer: BC Managed Care – PPO

## 2011-09-23 DIAGNOSIS — Z7901 Long term (current) use of anticoagulants: Secondary | ICD-10-CM

## 2011-09-23 DIAGNOSIS — I359 Nonrheumatic aortic valve disorder, unspecified: Secondary | ICD-10-CM

## 2011-10-17 ENCOUNTER — Other Ambulatory Visit: Payer: Self-pay | Admitting: Cardiovascular Disease

## 2011-10-17 ENCOUNTER — Telehealth: Payer: Self-pay | Admitting: Cardiovascular Disease

## 2011-10-17 NOTE — Telephone Encounter (Signed)
New problem:  Per Recall need an order for lab work to be put into the system.

## 2011-10-17 NOTE — Telephone Encounter (Signed)
Patient states labs are done by her PCP.

## 2011-10-18 NOTE — Telephone Encounter (Signed)
noted 

## 2011-11-02 ENCOUNTER — Ambulatory Visit (INDEPENDENT_AMBULATORY_CARE_PROVIDER_SITE_OTHER): Payer: BC Managed Care – PPO | Admitting: *Deleted

## 2011-11-02 DIAGNOSIS — I359 Nonrheumatic aortic valve disorder, unspecified: Secondary | ICD-10-CM

## 2011-11-02 DIAGNOSIS — Z7901 Long term (current) use of anticoagulants: Secondary | ICD-10-CM

## 2011-11-02 LAB — POCT INR: INR: 2.1

## 2011-12-15 ENCOUNTER — Ambulatory Visit (INDEPENDENT_AMBULATORY_CARE_PROVIDER_SITE_OTHER): Payer: BC Managed Care – PPO | Admitting: Pharmacist

## 2011-12-15 DIAGNOSIS — I359 Nonrheumatic aortic valve disorder, unspecified: Secondary | ICD-10-CM

## 2011-12-15 DIAGNOSIS — Z7901 Long term (current) use of anticoagulants: Secondary | ICD-10-CM

## 2011-12-15 LAB — POCT INR: INR: 3

## 2011-12-29 ENCOUNTER — Other Ambulatory Visit: Payer: Self-pay | Admitting: Internal Medicine

## 2012-01-09 ENCOUNTER — Ambulatory Visit (INDEPENDENT_AMBULATORY_CARE_PROVIDER_SITE_OTHER): Payer: Medicare Other | Admitting: Pharmacist

## 2012-01-09 ENCOUNTER — Ambulatory Visit (INDEPENDENT_AMBULATORY_CARE_PROVIDER_SITE_OTHER): Payer: Medicare Other | Admitting: Cardiovascular Disease

## 2012-01-09 ENCOUNTER — Encounter: Payer: Self-pay | Admitting: Cardiovascular Disease

## 2012-01-09 VITALS — BP 134/80 | HR 85 | Ht 61.0 in | Wt 139.0 lb

## 2012-01-09 DIAGNOSIS — I359 Nonrheumatic aortic valve disorder, unspecified: Secondary | ICD-10-CM

## 2012-01-09 DIAGNOSIS — Z7901 Long term (current) use of anticoagulants: Secondary | ICD-10-CM

## 2012-01-09 LAB — POCT INR: INR: 2.5

## 2012-01-09 NOTE — Patient Instructions (Addendum)
Your physician wants you to follow-up in: 1 year  You will receive a reminder letter in the mail two months in advance. If you don't receive a letter, please call our office to schedule the follow-up appointment.   Your physician recommends that you return for a FASTING lipid profile: 1 year   

## 2012-01-09 NOTE — Assessment & Plan Note (Signed)
[name] has a hx of AVR and is doing well.  We will continue current medications.

## 2012-01-09 NOTE — Progress Notes (Signed)
Amanda Wiggins Date of Birth  [birth year] [facility] [address]    [address] [place], [place]  [address] [phone]  Fax  [phone]  Problem List 1. Aortic valve replacement 2. Hyperlipidemia 3. Breast cancer   History of Present Illness:  Amanda Wiggins is a 66 year old female with a history of aortic stenosis-status post aortic valve replacement.  She also has a history of hypercholesterolemia.  She's not had any episodes of chest pain or shortness of breath.  She's had her INR levels checked in our Coumadin clinic. Her INR levels have all been therapeutic.   Current Outpatient Prescriptions on File Prior to Visit  Medication Sig Dispense Refill  . BIOTIN PO Take by mouth every morning.       . Calcium Carbonate-Vitamin D (CALCIUM 600+D) 600-400 MG-UNIT per tablet Take 1 tablet by mouth 2 (two) times daily.        . chlorpheniramine-HYDROcodone (TUSSIONEX) 10-8 MG/5ML LQCR Take 5 mLs by mouth every 12 (twelve) hours as needed.  115 mL  1  . chlorthalidone (HYGROTON) 25 MG tablet TAKE 1 TABLET DAILY FOR FOUR DAYS PER WEEK  90 tablet  1  . CRESTOR 10 MG tablet TAKE 1 TABLET DAILY  90 tablet  3  . cycloSPORINE (RESTASIS) 0.05 % ophthalmic emulsion Place 1 drop into both eyes 2 (two) times daily as needed.       . loratadine (CLARITIN) 10 MG tablet Take 10 mg by mouth daily.        . Multiple Vitamin (MULTIVITAMIN) capsule Take 1 capsule by mouth daily.        Marland Kitchen omeprazole (PRILOSEC) 20 MG capsule TAKE 1 CAPSULE DAILY  90 capsule  3  . potassium chloride SA (K-DUR,KLOR-CON) 20 MEQ tablet TAKE 1 TABLET DAILY 4 DAYS PER WEEK  90 tablet  3  . senna (SENOKOT) 8.6 MG tablet Take 2 tablets by mouth daily.        . SUMAtriptan (IMITREX) 25 MG tablet TAKE 1 TABLET DAILY AS NEEDED  4 tablet  5  . temazepam (RESTORIL) 30 MG capsule Take 1 capsule (30 mg total) by mouth at bedtime as needed.  90 capsule  3  . verapamil (CALAN-SR) 180 MG CR tablet TAKE 1 TABLET DAILY  90 tablet  3  .  warfarin (COUMADIN) 5 MG tablet TAKE AS DIRECTED BY YOUR PRESCRIBER  90 tablet  1  . zoledronic acid (RECLAST) 5 MG/100ML SOLN Inject 5 mg into the vein. Taking Once a Year        Allergies  Allergen Reactions  . Latex Hives  . Meperidine Hcl Nausea And Vomiting  . Sulfamethoxazole Nausea And Vomiting    REACTION: unspecified    Past Medical History  Diagnosis Date  . Hyperlipidemia   . Hypertension   . Hx of breast cancer   . GERD (gastroesophageal reflux disease)   . Osteopenia   . Status post aortic valve repair   . FHx: migraine headaches   . Menopausal syndrome   . Aortic valve replaced   . Hiatal hernia   . Hx Breast Cancer, IDC, Stage I 07/03/2003  . 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 Lummpectomy breast cancer 07/03/2003  . Aortic valve replacement 2003  . Hysterectomy, endometiral cancer 2001  . Right shouler surgery   . [employer] fundoplicaiton 2009    post-op hematoma on heparin   . Hiatal hernia repair   . Cardiac  catheterization     Ejection Fraction     History  Smoking status  . Never Smoker   Smokeless tobacco  . Never Used    History  Alcohol Use  . Yes    Comment: socially    Family History  Problem Relation Age of Onset  . Heart attack Mother 38  . Osteoarthritis Brother     hpercholesterolemia    Reviw of Systems:  Reviewed in the HPI.  All other systems are negative.  Physical Exam: BP 134/80  Pulse 85  Ht 5\' 1"  (1.549 m)  Wt 139 lb (63.05 kg)  BMI 26.26 kg/m2 The patient is alert and oriented x 3.  The mood and affect are normal.   Skin: warm and dry.  Color is normal.    HEENT:   the sclera are nonicteric.  The mucous membranes are moist.  The carotids are 2+ without bruits.  There is no thyromegaly.  There is no JVD.    Lungs: clear.  The chest wall is non tender.    Heart: regular rate with a normal S1 and mechanical S2.  There are no murmurs, gallops, or rubs.  The PMI is not displaced.     Abdomen: good bowel sounds.  There is no guarding or rebound.  There is no hepatosplenomegaly or tenderness.  There are no masses.   Extremities:  no clubbing, cyanosis, or edema.  The legs are without rashes.  The distal pulses are intact.   Neuro:  Cranial nerves II - XII are intact.  Motor and sensory functions are intact.    The gait is normal.  ECG: Nov. 4, 2013 -  At 85 NSR.  Nonspecific ST changes   Assessment / Plan:

## 2012-01-11 ENCOUNTER — Ambulatory Visit (HOSPITAL_BASED_OUTPATIENT_CLINIC_OR_DEPARTMENT_OTHER): Payer: Medicare Other | Admitting: Hematology & Oncology

## 2012-01-11 ENCOUNTER — Other Ambulatory Visit (HOSPITAL_BASED_OUTPATIENT_CLINIC_OR_DEPARTMENT_OTHER): Payer: Medicare Other | Admitting: Lab

## 2012-01-11 VITALS — BP 104/76 | HR 62 | Temp 98.5°F | Resp 18 | Ht 61.0 in | Wt 140.0 lb

## 2012-01-11 DIAGNOSIS — Z7901 Long term (current) use of anticoagulants: Secondary | ICD-10-CM

## 2012-01-11 DIAGNOSIS — Z853 Personal history of malignant neoplasm of breast: Secondary | ICD-10-CM

## 2012-01-11 DIAGNOSIS — Z954 Presence of other heart-valve replacement: Secondary | ICD-10-CM

## 2012-01-11 DIAGNOSIS — G4701 Insomnia due to medical condition: Secondary | ICD-10-CM

## 2012-01-11 DIAGNOSIS — C50919 Malignant neoplasm of unspecified site of unspecified female breast: Secondary | ICD-10-CM

## 2012-01-11 LAB — CBC WITH DIFFERENTIAL (CANCER CENTER ONLY)
BASO#: 0 10*3/uL (ref 0.0–0.2)
BASO%: 0.3 % (ref 0.0–2.0)
Eosinophils Absolute: 0.1 10*3/uL (ref 0.0–0.5)
HCT: 41.5 % (ref 34.8–46.6)
HGB: 14 g/dL (ref 11.6–15.9)
LYMPH#: 1.3 10*3/uL (ref 0.9–3.3)
LYMPH%: 21.2 % (ref 14.0–48.0)
MCV: 85 fL (ref 81–101)
MONO#: 0.5 10*3/uL (ref 0.1–0.9)
NEUT%: 68.9 % (ref 39.6–80.0)
RBC: 4.9 10*6/uL (ref 3.70–5.32)
RDW: 13 % (ref 11.1–15.7)
WBC: 6 10*3/uL (ref 3.9–10.0)

## 2012-01-11 NOTE — Progress Notes (Signed)
This office note has been dictated.

## 2012-01-12 LAB — COMPREHENSIVE METABOLIC PANEL
ALT: 22 U/L (ref 0–35)
CO2: 28 mEq/L (ref 19–32)
Calcium: 9.4 mg/dL (ref 8.4–10.5)
Chloride: 101 mEq/L (ref 96–112)
Creatinine, Ser: 0.82 mg/dL (ref 0.50–1.10)
Glucose, Bld: 100 mg/dL — ABNORMAL HIGH (ref 70–99)

## 2012-01-12 LAB — LACTATE DEHYDROGENASE: LDH: 168 U/L (ref 94–250)

## 2012-01-12 LAB — VITAMIN D 25 HYDROXY (VIT D DEFICIENCY, FRACTURES): Vit D, 25-Hydroxy: 49 ng/mL (ref 30–89)

## 2012-01-13 NOTE — Progress Notes (Signed)
CC:   Amanda Savers, MD Amanda Wiggins, M.D. Amanda Wiggins, M.D.  DIAGNOSES: 1. Stage I (T1 N0 M0) ductal carcinoma of the left breast. 2. Mechanical aortic valve.  CURRENT THERAPY:  Lifelong Coumadin.  INTERIM HISTORY:  Amanda Wiggins comes in for followup.  She is doing okay. We see her every 6 months.  She had no problems over the summertime. Her heart valve is doing pretty good.  She is getting her Coumadin checked by the Coumadin clinic.  She still has insomnia.  She is on I think Restoril for this.  She continues on multiple medications.  She had an EKG which showed a normal sinus rhythm.  Her last mammogram was done back in February of this past year.  She has had no fevers, sweats or chills.  There is no change in bowel or bladder habits.  She has had no cough or shortness of breath.  PHYSICAL EXAMINATION:  General:  This is a well-developed, well- nourished white female in no obvious distress.  Vital signs:  Shows a temperature of 98.5, pulse 62, respiratory rate 18, blood pressure 104/76.  Weight is 140.  Head and neck:  Shows a normocephalic, atraumatic skull.  There is no scleral icterus.  There are no oral lesions.  There is no adenopathy in the neck.  Lungs:  Clear bilaterally.  Cardiac:  Regular rate and rhythm with a normal S1, S2. She has a systolic click secondary to the mechanical valve.  Breasts: Shows right breast with no masses, edema or erythema.  There is no right axillary adenopathy.  Left breast shows a well-healed lumpectomy at the 12 o'clock position.  There is some slight contraction of the left breast.  There is no distinct mass in the left breast.  There is no left axillary adenopathy.  Abdomen:  Soft with good bowel sounds.  There is no fluid wave.  There is no abdominal mass.  There is no palpable hepatosplenomegaly.  Back:  Shows no kyphosis or osteoporotic changes. No tenderness is noted over the spine, ribs or hips.   Extremities: Shows no clubbing, cyanosis or edema.  LABORATORY STUDIES:  White cell count is 6, hemoglobin 14, hematocrit 42, platelet count 300.  Vitamin D is 49.  Metabolic panel is normal.  IMPRESSION:  Amanda Wiggins is a 66 year old female with history of stage I ductal carcinoma of the left breast.  She had surgery back in June of 2005.  She was ER positive.  She has been off Femara now for a year.  I do not see any evidence of breast cancer recurrence.  We will plan to get her back in in another 6 months.  When we see her back, she I think is due for her Reclast.    ______________________________ Amanda Wiggins, M.D. PRE/MEDQ  D:  01/12/2012  T:  01/13/2012  Job:  [id number]

## 2012-01-17 ENCOUNTER — Telehealth: Payer: Self-pay | Admitting: *Deleted

## 2012-01-17 NOTE — Telephone Encounter (Signed)
Message copied by Anselm Jungling on Tue Jan 17, 2012 11:12 AM ------      Message from: Arlan Organ R      Created: Mon Jan 16, 2012  7:28 AM       Call - labs are ok!!  Cindee Lame

## 2012-01-17 NOTE — Telephone Encounter (Signed)
Called patient to let her know that her labwork all looked great per dr. ennever 

## 2012-01-20 ENCOUNTER — Encounter: Payer: Self-pay | Admitting: Cardiovascular Disease

## 2012-02-20 ENCOUNTER — Ambulatory Visit (INDEPENDENT_AMBULATORY_CARE_PROVIDER_SITE_OTHER): Payer: Medicare Other | Admitting: *Deleted

## 2012-02-20 DIAGNOSIS — I359 Nonrheumatic aortic valve disorder, unspecified: Secondary | ICD-10-CM

## 2012-02-20 DIAGNOSIS — Z7901 Long term (current) use of anticoagulants: Secondary | ICD-10-CM

## 2012-03-19 ENCOUNTER — Other Ambulatory Visit: Payer: Self-pay | Admitting: Gynecology

## 2012-03-19 DIAGNOSIS — Z1231 Encounter for screening mammogram for malignant neoplasm of breast: Secondary | ICD-10-CM

## 2012-03-26 ENCOUNTER — Ambulatory Visit (INDEPENDENT_AMBULATORY_CARE_PROVIDER_SITE_OTHER): Payer: Medicare Other | Admitting: *Deleted

## 2012-03-26 DIAGNOSIS — Z7901 Long term (current) use of anticoagulants: Secondary | ICD-10-CM

## 2012-03-26 DIAGNOSIS — I359 Nonrheumatic aortic valve disorder, unspecified: Secondary | ICD-10-CM

## 2012-03-26 LAB — POCT INR: INR: 2.3

## 2012-04-23 ENCOUNTER — Ambulatory Visit
Admission: RE | Admit: 2012-04-23 | Discharge: 2012-04-23 | Disposition: A | Discharge status: Home / Self Care | Payer: Medicare Other | Source: Ambulatory Visit | Attending: Gynecology | Admitting: Gynecology

## 2012-04-23 DIAGNOSIS — Z1231 Encounter for screening mammogram for malignant neoplasm of breast: Secondary | ICD-10-CM

## 2012-04-27 ENCOUNTER — Other Ambulatory Visit: Payer: Self-pay | Admitting: *Deleted

## 2012-04-27 MED ORDER — WARFARIN SODIUM 5 MG PO TABS: 5.0000 mg | ORAL_TABLET | Freq: Every day | ORAL | Status: DC

## 2012-05-03 ENCOUNTER — Ambulatory Visit (INDEPENDENT_AMBULATORY_CARE_PROVIDER_SITE_OTHER): Payer: Medicare Other | Admitting: *Deleted

## 2012-05-03 DIAGNOSIS — Z7901 Long term (current) use of anticoagulants: Secondary | ICD-10-CM

## 2012-06-01 ENCOUNTER — Ambulatory Visit (INDEPENDENT_AMBULATORY_CARE_PROVIDER_SITE_OTHER): Payer: Medicare Other | Admitting: Internal Medicine

## 2012-06-01 ENCOUNTER — Encounter: Payer: Self-pay | Admitting: Internal Medicine

## 2012-06-01 DIAGNOSIS — E785 Hyperlipidemia, unspecified: Secondary | ICD-10-CM

## 2012-06-01 DIAGNOSIS — I359 Nonrheumatic aortic valve disorder, unspecified: Secondary | ICD-10-CM

## 2012-06-01 DIAGNOSIS — Z853 Personal history of malignant neoplasm of breast: Secondary | ICD-10-CM

## 2012-06-01 DIAGNOSIS — M818 Other osteoporosis without current pathological fracture: Secondary | ICD-10-CM

## 2012-06-01 DIAGNOSIS — I10 Essential (primary) hypertension: Secondary | ICD-10-CM

## 2012-06-01 DIAGNOSIS — G4701 Insomnia due to medical condition: Secondary | ICD-10-CM

## 2012-06-01 DIAGNOSIS — Z0000 Encounter for general adult medical examination without abnormal findings: Secondary | ICD-10-CM

## 2012-06-01 DIAGNOSIS — Z952 Presence of prosthetic heart valve: Secondary | ICD-10-CM

## 2012-06-01 DIAGNOSIS — Z954 Presence of other heart-valve replacement: Secondary | ICD-10-CM

## 2012-06-01 LAB — LIPID PANEL
Cholesterol: 183 mg/dL (ref 0–200)
HDL: 56.1 mg/dL (ref >39.00)
LDL Cholesterol: 101 mg/dL — ABNORMAL HIGH (ref 0–99)
Total CHOL/HDL Ratio: 3
Triglycerides: 128 mg/dL (ref 0.0–149.0)
VLDL: 25.6 mg/dL (ref 0.0–40.0)

## 2012-06-01 LAB — TSH: TSH: 2.57 u[IU]/mL (ref 0.35–5.50)

## 2012-06-01 MED ORDER — ROSUVASTATIN CALCIUM 10 MG PO TABS: ORAL_TABLET | ORAL | Status: DC

## 2012-06-01 MED ORDER — CHLORTHALIDONE 25 MG PO TABS: ORAL_TABLET | ORAL | Status: DC

## 2012-06-01 MED ORDER — TEMAZEPAM 30 MG PO CAPS: 30.0000 mg | ORAL_CAPSULE | Freq: Every evening | ORAL | Status: DC | PRN

## 2012-06-01 MED ORDER — OMEPRAZOLE 20 MG PO CPDR: DELAYED_RELEASE_CAPSULE | ORAL | Status: DC

## 2012-06-01 MED ORDER — POTASSIUM CHLORIDE CRYS ER 20 MEQ PO TBCR: EXTENDED_RELEASE_TABLET | ORAL | Status: DC

## 2012-06-01 MED ORDER — VERAPAMIL HCL ER 180 MG PO TBCR: EXTENDED_RELEASE_TABLET | ORAL | Status: DC

## 2012-06-01 NOTE — Patient Instructions (Signed)
Take a calcium supplement, plus 800-1200 units of vitamin D    It is important that you exercise regularly, at least 20 minutes 3 to 4 times per week.  If you develop chest pain or shortness of breath seek  medical attention.  Return in one year for follow-up   

## 2012-06-01 NOTE — Progress Notes (Signed)
Patient ID: Amanda Wiggins, female   DOB: Jan 10, 1946, 67 y.o.   MRN: [id number]  Subjective:    Patient ID: Amanda Wiggins, female    DOB: 1946-02-11, 67 y.o.   MRN: [id number]  HPI    Review of Systems  Constitutional: Negative for fever, appetite change, fatigue and unexpected weight change.  HENT: Negative for hearing loss, ear pain, nosebleeds, congestion, sore throat, mouth sores, trouble swallowing, neck stiffness, dental problem, voice change, sinus pressure and tinnitus.   Eyes: Negative for photophobia, pain, redness and visual disturbance.  Respiratory: Negative for cough, chest tightness and shortness of breath.   Cardiovascular: Negative for chest pain, palpitations and leg swelling.  Gastrointestinal: Negative for nausea, vomiting, abdominal pain, diarrhea, constipation, blood in stool, abdominal distention and rectal pain.  Genitourinary: Negative for dysuria, urgency, frequency, hematuria, flank pain, vaginal bleeding, vaginal discharge, difficulty urinating, genital sores, vaginal pain, menstrual problem and pelvic pain.  Musculoskeletal: Positive for arthralgias. Negative for back pain.       Significant right thumb pain. Followed by hand surgery  Skin: Negative for rash.  Neurological: Negative for dizziness, syncope, speech difficulty, weakness, light-headedness, numbness and headaches.  Hematological: Negative for adenopathy. Does not bruise/bleed easily.  Psychiatric/Behavioral: Negative for suicidal ideas, behavioral problems, self-injury, dysphoric mood and agitation. The patient is not nervous/anxious.        Objective:   Physical Exam  Constitutional: She is oriented to person, place, and time. She appears well-developed and well-nourished.  HENT:  Head: Normocephalic and atraumatic.  Right Ear: External ear normal.  Left Ear: External ear normal.  Mouth/Throat: Oropharynx is clear and moist.  Eyes: Conjunctivae and EOM are normal.  Neck: Normal range of  motion. Neck supple. No JVD present. No thyromegaly present.  Cardiovascular: Normal rate, regular rhythm, normal heart sounds and intact distal pulses.   No murmur heard. Prosthetic heart sounds  Pulmonary/Chest: Effort normal and breath sounds normal. She has no wheezes. She has no rales.  Abdominal: Soft. Bowel sounds are normal. She exhibits no distension and no mass. There is no tenderness. There is no rebound and no guarding.  Musculoskeletal: Normal range of motion. She exhibits no edema and no tenderness.  Neurological: She is alert and oriented to person, place, and time. She has normal reflexes. No cranial nerve deficit. She exhibits normal muscle tone. Coordination normal.  Skin: Skin is warm and dry. No rash noted.  Psychiatric: She has a normal mood and affect. Her behavior is normal.          Assessment & Plan:   Subjective:    Patient ID: Amanda Wiggins, female    DOB: 07-Apr-1945, 67 y.o.   MRN: [id number]  HPI   67 year-old patient who is seen today for a health maintenance examination. She is followed by multiple consultants including cardiology oncology OB/GYN general surgery and ENT. She is doing reasonably well. She is now  Approximately 9 years out from aortic valve replacement surgery. Since her last visit here osteopenia has progressed to osteoporosis secondary to aromatase inhibition. She is now receiving annual reclast injections   Allergies:  1) ! Demerol  2) Sulfamethoxazole (Sulfamethoxazole)   Past History:  Past Medical History:  Hyperlipidemia  Hypertension  Breast cancer, hx of  GERD  Osteoporosis status post aortic valve repair  migraine headaches  menopausal syndrome   Past Surgical History:  Lumpectomy breast cancer 2005  Aortic Valve Replacement: 2003  Hysterectomy,endometrial cancer 2001  Right shoulder surgery  [employer]issan  fundoplication 2009, post-op hematoma on heparin  colonoscopy 11-08   Family History:  Reviewed history from  09/12/2007 and no changes required.  father status post aortic valve surgery  mother status post MI, age 67  Both parents are still living in her 68s One brother, history of hypercholesterolemia, osteoarthritis  one sister  No FH of Colon Cancer   Social History:  Reviewed history from 09/12/2007 and no changes required.  married,  Patient has never smoked.  Alcohol Use - socially  Occupation: Retired  Illicit Drug Use - no  Patient gets regular exercise.   Past Medical History  Diagnosis Date  . Hyperlipidemia   . Hypertension   . Hx of breast cancer   . GERD (gastroesophageal reflux disease)   . Osteopenia   . Status post aortic valve repair   . FHx: migraine headaches   . Menopausal syndrome    Past Surgical History  Procedure Date  . Lummpectomy breast cancer 2005  . Aortic valve replacement 2003  . Hysterectomy, endometiral cancer 2001  . Right shouler surgery   . [employer] fundoplicaiton 2009    post-op hematoma on heparin     reports that she has never smoked. She has never used smokeless tobacco. She reports that she drinks alcohol. She reports that she does not use illicit drugs. family history includes Heart attack (age of onset:59) in her mother and Osteoarthritis in her brother. Allergies  Allergen Reactions  . Meperidine Hcl   . Sulfamethoxazole     REACTION: unspecified   1. Risk factors, based on past  M,S,F history- cardiovascular risk factors include hypertension and dyslipidemia. She is status post aortic valve repair  2.  Physical activities: No exercise limitations  3.  Depression/mood: No history of depression or mood disorder  4.  Hearing: No deficits  5.  ADL's: Independent in all aspects of daily living  6.  Fall risk: Low  7.  Home safety: No proms identifying  8.  Height weight, and visual acuity; height and weight stable no change in visual acuity  9.  Counseling: Regular exercise program encouraged  10. Lab orders based on risk  factors: Will check a lipid profile and TSH  11. Referral : Followup GYN cardiology oncology  12. Care plan: Continue present regimen. Follow multiple consultants  13. Cognitive assessment: Alert and oriented with normal affect. No cognitive dysfunction      Assessment & Plan:    Preventive health examination. Status post aortic valve repair Osteoporosis Hypertension Dyslipidemia. We'll check a lipid profile Remote breast and endometrial cancer  Recheck 1 year Follow multiple consultants

## 2012-06-15 ENCOUNTER — Ambulatory Visit (INDEPENDENT_AMBULATORY_CARE_PROVIDER_SITE_OTHER): Payer: Medicare Other | Admitting: *Deleted

## 2012-06-15 DIAGNOSIS — I359 Nonrheumatic aortic valve disorder, unspecified: Secondary | ICD-10-CM

## 2012-06-15 DIAGNOSIS — Z7901 Long term (current) use of anticoagulants: Secondary | ICD-10-CM

## 2012-07-10 ENCOUNTER — Other Ambulatory Visit: Payer: Self-pay | Admitting: Medical

## 2012-07-10 DIAGNOSIS — Z853 Personal history of malignant neoplasm of breast: Secondary | ICD-10-CM

## 2012-07-11 ENCOUNTER — Telehealth: Payer: Self-pay | Admitting: Hematology & Oncology

## 2012-07-11 ENCOUNTER — Ambulatory Visit: Payer: Medicare Other

## 2012-07-11 ENCOUNTER — Ambulatory Visit (HOSPITAL_BASED_OUTPATIENT_CLINIC_OR_DEPARTMENT_OTHER): Payer: Medicare Other | Admitting: Medical

## 2012-07-11 ENCOUNTER — Other Ambulatory Visit: Payer: Medicare Other | Admitting: Lab

## 2012-07-11 ENCOUNTER — Ambulatory Visit: Payer: Medicare Other | Admitting: Hematology & Oncology

## 2012-07-11 ENCOUNTER — Ambulatory Visit (HOSPITAL_BASED_OUTPATIENT_CLINIC_OR_DEPARTMENT_OTHER): Payer: Medicare Other | Admitting: Lab

## 2012-07-11 VITALS — BP 144/82 | HR 62 | Temp 98.2°F | Resp 16 | Ht 62.0 in | Wt 131.0 lb

## 2012-07-11 DIAGNOSIS — Z853 Personal history of malignant neoplasm of breast: Secondary | ICD-10-CM

## 2012-07-11 DIAGNOSIS — G4701 Insomnia due to medical condition: Secondary | ICD-10-CM

## 2012-07-11 DIAGNOSIS — G4700 Insomnia, unspecified: Secondary | ICD-10-CM

## 2012-07-11 DIAGNOSIS — M810 Age-related osteoporosis without current pathological fracture: Secondary | ICD-10-CM

## 2012-07-11 DIAGNOSIS — Z7901 Long term (current) use of anticoagulants: Secondary | ICD-10-CM

## 2012-07-11 LAB — CBC WITH DIFFERENTIAL (CANCER CENTER ONLY)
BASO#: 0 10*3/uL (ref 0.0–0.2)
Eosinophils Absolute: 0 10*3/uL (ref 0.0–0.5)
HCT: 44.4 % (ref 34.8–46.6)
LYMPH%: 16 % (ref 14.0–48.0)
MCH: 28.4 pg (ref 26.0–34.0)
MCV: 85 fL (ref 81–101)
MONO#: 0.6 10*3/uL (ref 0.1–0.9)
MONO%: 8 % (ref 0.0–13.0)
NEUT%: 75.2 % (ref 39.6–80.0)
RBC: 5.22 10*6/uL (ref 3.70–5.32)
WBC: 7.9 10*3/uL (ref 3.9–10.0)

## 2012-07-11 MED ORDER — TEMAZEPAM 30 MG PO CAPS: 30.0000 mg | ORAL_CAPSULE | Freq: Every evening | ORAL | Status: DC | PRN

## 2012-07-11 NOTE — Telephone Encounter (Signed)
Patient cx 07/11/12 infusion apt and resch for 08/24/12

## 2012-07-11 NOTE — Progress Notes (Signed)
DIAGNOSES: 1. Stage I (T1 N0 M0) ductal carcinoma of the left breast. 2. Mechanical aortic valve.  CURRENT THERAPY:   #1.  Lifelong Coumadin. #2.  Reclast 4 mg IV yearly(last given 08/22/2011)  INTERIM HISTORY:   Amanda Wiggins presents today for an office followup visit.  She is now 9 years out from her left breast cancer.  She recently had a mammogram.  Back in February, and it was benign.  She has now been off Femara.  For over a year.  She really does not have a hot flashes like she used to.  She remains on Coumadin.  She does have an aortic valve replacement.  She's not having any bleeding problems.  On the Coumadin.  She remains on Restoril for insomnia.  She, reports, a good appetite.  She denies any nausea, vomiting, diarrhea, constipation, chest pain, shortness of breath, or cough.  She denies any fevers, chills, or night sweats.  She denies any abdominal pain, any bony, pain.  She denies any lower leg swelling.  She denies any obvious, or normal, bleeding.  She denies any headaches, visual changes, or rashes.    Review of Systems: Constitutional:Negative for malaise/fatigue, fever, chills, weight loss, diaphoresis, activity change, appetite change, and unexpected weight change.  HEENT: Negative for double vision, blurred vision, visual loss, ear pain, tinnitus, congestion, rhinorrhea, epistaxis sore throat or sinus disease, oral pain/lesion, tongue soreness Respiratory: Negative for cough, chest tightness, shortness of breath, wheezing and stridor.  Cardiovascular: Negative for chest pain, palpitations, leg swelling, orthopnea, PND, DOE or claudication Gastrointestinal: Negative for nausea, vomiting, abdominal pain, diarrhea, constipation, blood in stool, melena, hematochezia, abdominal distention, anal bleeding, rectal pain, anorexia and hematemesis.  Genitourinary: Negative for dysuria, frequency, hematuria,  Musculoskeletal: Negative for myalgias, back pain, joint swelling, arthralgias and  gait problem.  Skin: Negative for rash, color change, pallor and wound.  Neurological:. Negative for dizziness/light-headedness, tremors, seizures, syncope, facial asymmetry, speech difficulty, weakness, numbness, headaches and paresthesias.  Hematological: Negative for adenopathy. Does not bruise/bleed easily.  Psychiatric/Behavioral:  Negative for depression, no loss of interest in normal activity or change in sleep pattern.   Physical Exam: This is a pleasant, 67 year old, well-developed, well-nourished, white female, in no obvious distress Vitals:.  Temperature 98.2 degrees, pulse 62, respirations 16, blood pressure 144/82.  Weight 131 pounds HEENT reveals a normocephalic, atraumatic skull, no scleral icterus, no oral lesions  Neck is supple without any cervical or supraclavicular adenopathy.  Lungs are clear to auscultation bilaterally. There are no wheezes, rales or rhonci Cardiac is regular rate and rhythm with a normal S1 and S2..  She does have a systolic click, secondary to mechanical valve.   There are no murmurs, rubs, or bruits.  Abdomen is soft with good bowel sounds, there is no palpable mass. There is no palpable hepatosplenomegaly. There is no palpable fluid wave.  Musculoskeletal no tenderness of the spine, ribs, or hips.  Extremities there are no clubbing, cyanosis, or edema.  Skin no petechia, purpura or ecchymosis Neurologic is nonfocal. Breasts: Right breast is with no masses, edema, or erythema.  There is no right axillary adenopathy.  Left breast shows a well healed lumpectomy at the 12:00 position.  There is some slight contraction of the left breast.  There is no distinct mass in the left breast.  There is no left axillary adenopathy.   Laboratory Data: white count 7.9, hemoglobin 14.8, hematocrit 44.4, platelets 318,000   Current Outpatient Prescriptions on File Prior to Visit  Medication  Sig Dispense Refill  . BIOTIN PO Take by mouth every morning.       .  Calcium Carbonate-Vitamin D (CALCIUM 600+D) 600-400 MG-UNIT per tablet Take 1 tablet by mouth 2 (two) times daily.        . chlorthalidone (HYGROTON) 25 MG tablet TAKE 1 TABLET DAILY FOR FOUR DAYS PER WEEK  90 tablet  6  . cycloSPORINE (RESTASIS) 0.05 % ophthalmic emulsion Place 1 drop into both eyes 2 (two) times daily as needed.       . loratadine (CLARITIN) 10 MG tablet Take 10 mg by mouth daily.        . Multiple Vitamin (MULTIVITAMIN) capsule Take 1 capsule by mouth daily.        Marland Kitchen omeprazole (PRILOSEC) 20 MG capsule TAKE 1 CAPSULE DAILY  90 capsule  6  . potassium chloride SA (K-DUR,KLOR-CON) 20 MEQ tablet TAKE 1 TABLET DAILY 4 DAYS PER WEEK  90 tablet  6  . rosuvastatin (CRESTOR) 10 MG tablet TAKE 1 TABLET DAILY  90 tablet  6  . senna (SENOKOT) 8.6 MG tablet Take 2 tablets by mouth daily.        . SUMAtriptan (IMITREX) 25 MG tablet TAKE 1 TABLET DAILY AS NEEDED  4 tablet  5  . temazepam (RESTORIL) 30 MG capsule Take 1 capsule (30 mg total) by mouth at bedtime as needed.  90 capsule  3  . verapamil (CALAN-SR) 180 MG CR tablet TAKE 1 TABLET DAILY  90 tablet  6  . warfarin (COUMADIN) 5 MG tablet Take 1 tablet (5 mg total) by mouth daily.  90 tablet  1  . zoledronic acid (RECLAST) 5 MG/100ML SOLN Inject 5 mg into the vein. Taking Once a Year       No current facility-administered medications on file prior to visit.   Assessment/Plan: This is a pleasant, 67 year old, white female, with the following issues:  #1.  History of stage I ductal carcinoma of the left breast.  She had surgery.  Back in June of 2005.  She was ER positive.  She has been off Femara now for over a year.  They do not see any evidence of recurrence on today's exam.  We will continue to followup with her every 6 months.  Her next yearly mammogram is due February, 20, 15.   #2.  Osteoporosis.  She is due for Reclast in June.  She received this on a yearly basis.    #3.  Followup.  We will follow back up with Amanda Wiggins in 6  months, but before then should there be questions or concerns.

## 2012-07-12 LAB — LACTATE DEHYDROGENASE: LDH: 197 U/L (ref 94–250)

## 2012-07-12 LAB — VITAMIN D 25 HYDROXY (VIT D DEFICIENCY, FRACTURES): Vit D, 25-Hydroxy: 50 ng/mL (ref 30–89)

## 2012-07-12 LAB — COMPREHENSIVE METABOLIC PANEL
ALT: 23 U/L (ref 0–35)
BUN: 19 mg/dL (ref 6–23)
CO2: 28 mEq/L (ref 19–32)
Calcium: 9.2 mg/dL (ref 8.4–10.5)
Chloride: 103 mEq/L (ref 96–112)
Creatinine, Ser: 0.9 mg/dL (ref 0.50–1.10)
Glucose, Bld: 96 mg/dL (ref 70–99)
Total Bilirubin: 0.4 mg/dL (ref 0.3–1.2)

## 2012-07-27 ENCOUNTER — Ambulatory Visit (INDEPENDENT_AMBULATORY_CARE_PROVIDER_SITE_OTHER): Payer: Medicare Other | Admitting: *Deleted

## 2012-07-27 DIAGNOSIS — I359 Nonrheumatic aortic valve disorder, unspecified: Secondary | ICD-10-CM

## 2012-07-27 DIAGNOSIS — Z7901 Long term (current) use of anticoagulants: Secondary | ICD-10-CM

## 2012-07-27 LAB — POCT INR: INR: 2.4

## 2012-08-04 ENCOUNTER — Other Ambulatory Visit: Payer: Self-pay | Admitting: Internal Medicine

## 2012-08-23 ENCOUNTER — Other Ambulatory Visit: Payer: Self-pay | Admitting: *Deleted

## 2012-08-23 NOTE — Progress Notes (Signed)
error 

## 2012-08-24 ENCOUNTER — Ambulatory Visit (HOSPITAL_BASED_OUTPATIENT_CLINIC_OR_DEPARTMENT_OTHER): Payer: Medicare Other

## 2012-08-24 ENCOUNTER — Other Ambulatory Visit: Payer: Medicare Other | Admitting: Lab

## 2012-08-24 DIAGNOSIS — M810 Age-related osteoporosis without current pathological fracture: Secondary | ICD-10-CM

## 2012-08-24 MED ORDER — ZOLEDRONIC ACID 4 MG/5ML IV CONC: 5.0000 mg | Freq: Once | INTRAVENOUS | Status: DC

## 2012-08-24 MED ORDER — SODIUM CHLORIDE 0.9 % IV SOLN
Freq: Once | INTRAVENOUS | Status: AC
  Administered 2012-08-24: 10:00:00 via INTRAVENOUS

## 2012-08-24 MED ORDER — ZOLEDRONIC ACID 4 MG/100ML IV SOLN
4.0000 mg | Freq: Once | INTRAVENOUS | Status: AC
  Administered 2012-08-24: 4 mg via INTRAVENOUS
  Filled 2012-08-24: qty 100

## 2012-09-04 ENCOUNTER — Ambulatory Visit (INDEPENDENT_AMBULATORY_CARE_PROVIDER_SITE_OTHER): Payer: Medicare Other | Admitting: *Deleted

## 2012-09-04 DIAGNOSIS — Z7901 Long term (current) use of anticoagulants: Secondary | ICD-10-CM

## 2012-09-04 DIAGNOSIS — I359 Nonrheumatic aortic valve disorder, unspecified: Secondary | ICD-10-CM

## 2012-10-10 ENCOUNTER — Other Ambulatory Visit: Payer: Self-pay

## 2012-10-16 ENCOUNTER — Ambulatory Visit (INDEPENDENT_AMBULATORY_CARE_PROVIDER_SITE_OTHER): Payer: Medicare Other | Admitting: Pharmacist

## 2012-10-16 DIAGNOSIS — Z7901 Long term (current) use of anticoagulants: Secondary | ICD-10-CM

## 2012-10-16 DIAGNOSIS — I359 Nonrheumatic aortic valve disorder, unspecified: Secondary | ICD-10-CM

## 2012-10-16 LAB — POCT INR: INR: 2.3

## 2012-10-22 ENCOUNTER — Other Ambulatory Visit: Payer: Self-pay | Admitting: Internal Medicine

## 2012-11-23 ENCOUNTER — Ambulatory Visit (INDEPENDENT_AMBULATORY_CARE_PROVIDER_SITE_OTHER): Payer: Medicare Other | Admitting: Pharmacist

## 2012-11-23 DIAGNOSIS — Z7901 Long term (current) use of anticoagulants: Secondary | ICD-10-CM

## 2012-11-23 DIAGNOSIS — I359 Nonrheumatic aortic valve disorder, unspecified: Secondary | ICD-10-CM

## 2012-12-14 ENCOUNTER — Other Ambulatory Visit: Payer: Self-pay | Admitting: Internal Medicine

## 2012-12-20 ENCOUNTER — Other Ambulatory Visit: Payer: Self-pay | Admitting: Gynecology

## 2012-12-31 ENCOUNTER — Encounter: Payer: Self-pay | Admitting: Internal Medicine

## 2013-01-01 ENCOUNTER — Encounter: Payer: Self-pay | Admitting: Cardiovascular Disease

## 2013-01-08 ENCOUNTER — Ambulatory Visit: Payer: Medicare Other | Admitting: Cardiovascular Disease

## 2013-01-09 ENCOUNTER — Other Ambulatory Visit: Payer: Medicare Other | Admitting: Lab

## 2013-01-09 ENCOUNTER — Ambulatory Visit: Payer: Medicare Other | Admitting: Hematology & Oncology

## 2013-01-10 ENCOUNTER — Ambulatory Visit (INDEPENDENT_AMBULATORY_CARE_PROVIDER_SITE_OTHER): Payer: Medicare Other | Admitting: General Practice

## 2013-01-10 ENCOUNTER — Ambulatory Visit (INDEPENDENT_AMBULATORY_CARE_PROVIDER_SITE_OTHER): Payer: Medicare Other | Admitting: Cardiovascular Disease

## 2013-01-10 ENCOUNTER — Encounter: Payer: Self-pay | Admitting: Cardiovascular Disease

## 2013-01-10 ENCOUNTER — Other Ambulatory Visit: Payer: Medicare Other

## 2013-01-10 ENCOUNTER — Other Ambulatory Visit: Payer: Self-pay

## 2013-01-10 VITALS — BP 122/78 | HR 78 | Ht 62.0 in | Wt 131.0 lb

## 2013-01-10 DIAGNOSIS — I359 Nonrheumatic aortic valve disorder, unspecified: Secondary | ICD-10-CM

## 2013-01-10 DIAGNOSIS — I10 Essential (primary) hypertension: Secondary | ICD-10-CM

## 2013-01-10 DIAGNOSIS — Z7901 Long term (current) use of anticoagulants: Secondary | ICD-10-CM

## 2013-01-10 DIAGNOSIS — Z954 Presence of other heart-valve replacement: Secondary | ICD-10-CM

## 2013-01-10 DIAGNOSIS — Z952 Presence of prosthetic heart valve: Secondary | ICD-10-CM

## 2013-01-10 DIAGNOSIS — E785 Hyperlipidemia, unspecified: Secondary | ICD-10-CM

## 2013-01-10 LAB — BASIC METABOLIC PANEL
BUN: 17 mg/dL (ref 6–23)
Creatinine, Ser: 0.8 mg/dL (ref 0.4–1.2)
GFR: 75.2 mL/min (ref >60.00)
Glucose, Bld: 99 mg/dL (ref 70–99)
Potassium: 4.2 mEq/L (ref 3.5–5.1)

## 2013-01-10 LAB — HEPATIC FUNCTION PANEL
ALT: 30 U/L (ref 0–35)
Alkaline Phosphatase: 48 U/L (ref 39–117)
Bilirubin, Direct: 0 mg/dL (ref 0.0–0.3)
Total Bilirubin: 0.4 mg/dL (ref 0.3–1.2)

## 2013-01-10 LAB — LIPID PANEL
LDL Cholesterol: 100 mg/dL — ABNORMAL HIGH (ref 0–99)
VLDL: 28.8 mg/dL (ref 0.0–40.0)

## 2013-01-10 LAB — POCT INR: INR: 2.1

## 2013-01-10 NOTE — Assessment & Plan Note (Signed)
Labs have been drawn.

## 2013-01-10 NOTE — Progress Notes (Signed)
Amanda Wiggins Date of Birth  January 08, 1946 [facility] [address]    [address] [place], [place]  [address] [phone]  Fax  [phone]  Problem List 1. Aortic valve replacement 2. Hyperlipidemia 3. Breast cancer   History of Present Illness:  Amanda Wiggins is a 67 year old female with a history of aortic stenosis-status post aortic valve replacement.  She also has a history of hypercholesterolemia.  She's not had any episodes of chest pain or shortness of breath.  She's had her INR levels checked in our Coumadin clinic. Her INR levels have all been therapeutic.  Nov. 6 , 2014  Amanda Wiggins is doing well.  i saw her a year ago.  She is 10 years our from her breast cancer and is doing well.    No cardiac complaints.   Able to do all of her normal activities.      Current Outpatient Prescriptions on File Prior to Visit  Medication Sig Dispense Refill  . BIOTIN PO Take by mouth every morning.       . Calcium Carbonate-Vitamin D (CALCIUM 600+D) 600-400 MG-UNIT per tablet Take 1 tablet by mouth 2 (two) times daily.        . chlorthalidone (HYGROTON) 25 MG tablet TAKE 1 TABLET DAILY FOR FOUR DAYS PER WEEK  90 tablet  6  . CRESTOR 10 MG tablet TAKE 1 TABLET AT BEDTIME  90 tablet  1  . cycloSPORINE (RESTASIS) 0.05 % ophthalmic emulsion Place 1 drop into both eyes 2 (two) times daily as needed.       . Glucosamine-Chondroit-Vit C-Mn (GLUCOSAMINE CHONDR 1500 COMPLX) CAPS Take by mouth every morning.      . loratadine (CLARITIN) 10 MG tablet Take 10 mg by mouth daily.        . Multiple Vitamin (MULTIVITAMIN) capsule Take 1 capsule by mouth daily.        . potassium chloride SA (K-DUR,KLOR-CON) 20 MEQ tablet TAKE 1 TABLET DAILY 4 DAYS PER WEEK  90 tablet  6  . senna (SENOKOT) 8.6 MG tablet Take 2 tablets by mouth daily.        . SUMAtriptan (IMITREX) 25 MG tablet TAKE 1 TABLET DAILY AS NEEDED  4 tablet  5  . temazepam (RESTORIL) 30 MG capsule Take 1 capsule (30 mg total) by mouth at bedtime as  needed.  90 capsule  3  . verapamil (CALAN-SR) 180 MG CR tablet TAKE 1 TABLET DAILY  90 tablet  0  . warfarin (COUMADIN) 5 MG tablet TAKE 1 TABLET (5 MG TOTAL) BY MOUTH DAILY.  90 tablet  1  . zoledronic acid (RECLAST) 5 MG/100ML SOLN Inject 5 mg into the vein. Taking Once a Year       No current facility-administered medications on file prior to visit.    Allergies  Allergen Reactions  . Latex Hives  . Meperidine Hcl Nausea And Vomiting  . Sulfamethoxazole Nausea And Vomiting    REACTION: unspecified    Past Medical History  Diagnosis Date  . Hyperlipidemia   . Hypertension   . Hx of breast cancer   . GERD (gastroesophageal reflux disease)   . Osteopenia   . Status post aortic valve repair   . FHx: migraine headaches   . Menopausal syndrome   . Aortic valve replaced   . Hiatal hernia   . Hx Breast Cancer, IDC, Stage I 07/03/2003  . 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 08/22/2011    Past Surgical History  Procedure Laterality Date  .  Lummpectomy breast cancer  07/03/2003  . Aortic valve replacement  2003  . Hysterectomy, endometiral cancer  2001  . Right shouler surgery    . [employer] fundoplicaiton  2009    post-op hematoma on heparin   . Hiatal hernia repair    . Cardiac catheterization      Ejection Fraction     History  Smoking status  . Never Smoker   Smokeless tobacco  . Never Used    History  Alcohol Use  . Yes    Comment: socially    Family History  Problem Relation Age of Onset  . Heart attack Mother 69  . Osteoarthritis Brother     hpercholesterolemia    Reviw of Systems:  Reviewed in the HPI.  All other systems are negative.  Physical Exam: BP 122/78  Pulse 78  Ht 5\' 2"  (1.575 m)  Wt 131 lb (59.421 kg)  BMI 23.95 kg/m2 The patient is alert and oriented x 3.  The mood and affect are normal.   Skin: warm and dry.  Color is normal.    HEENT:   the sclera are nonicteric.  The mucous membranes are moist.  The carotids are 2+ without  bruits.  There is no thyromegaly.  There is no JVD.    Lungs: clear.  The chest wall is non tender.    Heart: regular rate with a normal S1 and mechanical S2.  There are no murmurs, gallops, or rubs. The PMI is not displaced.     Abdomen: good bowel sounds.  There is no guarding or rebound.  There is no hepatosplenomegaly or tenderness.  There are no masses.   Extremities:  no clubbing, cyanosis, or edema.  The legs are without rashes.  The distal pulses are intact.   Neuro:  Cranial nerves II - XII are intact.  Motor and sensory functions are intact.    The gait is normal.  ECG: Nov. 6, 2014:  NSR at 71.  LAE.  NS St abn.  Assessment / Plan:

## 2013-01-10 NOTE — Patient Instructions (Signed)
Your physician wants you to follow-up in: 1 year  You will receive a reminder letter in the mail two months in advance. If you don't receive a letter, please call our office to schedule the follow-up appointment.  Your physician recommends that you continue on your current medications as directed. Please refer to the Current Medication list given to you today.  

## 2013-01-10 NOTE — Assessment & Plan Note (Signed)
She is s/p AVR.  Doing well.  No changes.  inr levels are normal.

## 2013-02-06 ENCOUNTER — Ambulatory Visit (HOSPITAL_BASED_OUTPATIENT_CLINIC_OR_DEPARTMENT_OTHER): Payer: Medicare Other | Admitting: Hematology & Oncology

## 2013-02-06 ENCOUNTER — Other Ambulatory Visit (HOSPITAL_BASED_OUTPATIENT_CLINIC_OR_DEPARTMENT_OTHER): Payer: Medicare Other | Admitting: Lab

## 2013-02-06 VITALS — BP 138/80 | HR 72 | Temp 98.3°F | Resp 14 | Ht 62.0 in | Wt 133.0 lb

## 2013-02-06 DIAGNOSIS — G4701 Insomnia due to medical condition: Secondary | ICD-10-CM

## 2013-02-06 DIAGNOSIS — Z853 Personal history of malignant neoplasm of breast: Secondary | ICD-10-CM

## 2013-02-06 DIAGNOSIS — E559 Vitamin D deficiency, unspecified: Secondary | ICD-10-CM

## 2013-02-06 LAB — CBC WITH DIFFERENTIAL (CANCER CENTER ONLY)
BASO#: 0 10*3/uL (ref 0.0–0.2)
Eosinophils Absolute: 0.1 10*3/uL (ref 0.0–0.5)
HCT: 42.3 % (ref 34.8–46.6)
HGB: 13.8 g/dL (ref 11.6–15.9)
LYMPH#: 1.2 10*3/uL (ref 0.9–3.3)
LYMPH%: 23.2 % (ref 14.0–48.0)
MCH: 28.3 pg (ref 26.0–34.0)
MONO%: 7.7 % (ref 0.0–13.0)
NEUT#: 3.6 10*3/uL (ref 1.5–6.5)
NEUT%: 67.2 % (ref 39.6–80.0)
RBC: 4.88 10*6/uL (ref 3.70–5.32)

## 2013-02-06 MED ORDER — TEMAZEPAM 30 MG PO CAPS: 30.0000 mg | ORAL_CAPSULE | Freq: Every evening | ORAL | Status: DC | PRN

## 2013-02-06 NOTE — Progress Notes (Signed)
This office note has been dictated.

## 2013-02-07 LAB — COMPREHENSIVE METABOLIC PANEL
ALT: 27 U/L (ref 0–35)
Albumin: 4.5 g/dL (ref 3.5–5.2)
BUN: 18 mg/dL (ref 6–23)
CO2: 30 mEq/L (ref 19–32)
Calcium: 9.3 mg/dL (ref 8.4–10.5)
Chloride: 102 mEq/L (ref 96–112)
Creatinine, Ser: 0.78 mg/dL (ref 0.50–1.10)
Glucose, Bld: 69 mg/dL — ABNORMAL LOW (ref 70–99)
Potassium: 3.2 mEq/L — ABNORMAL LOW (ref 3.5–5.3)
Total Bilirubin: 0.4 mg/dL (ref 0.3–1.2)

## 2013-02-07 LAB — LACTATE DEHYDROGENASE: LDH: 212 U/L (ref 94–250)

## 2013-02-07 LAB — VITAMIN D 25 HYDROXY (VIT D DEFICIENCY, FRACTURES): Vit D, 25-Hydroxy: 53 ng/mL (ref 30–89)

## 2013-02-10 NOTE — Progress Notes (Signed)
CC:   Gordy Savers, MD [name], M.D. [name], M.D.  DIAGNOSES: 1. Stage I (T1 N0 M0) ductal carcinoma of the left breast. 2. Mechanical aortic valve.  CURRENT THERAPY:  Coumadin - lifelong.  INTERIM HISTORY:  Ms. Santino comes in for her yearly followup.  She has done very, very well.  Since we last saw her, she has really had no issues.  She had a mammogram back in February and everything looked okay at that point in time.  She has had no problems with bleeding or bruising.  She sees Dr. Elease Hashimoto. He also has her on baby aspirin right now.  She has had no cough or shortness of breath.  There has been no leg swelling.  She has had no fever, sweats, or chills.  PHYSICAL EXAMINATION:  General:  This is a well-developed, well- nourished white female, in no obvious distress.  Vital Signs: Temperature of 98.3, pulse 72, respiratory rate 14, blood pressure 138/80.  Weight is 133 pounds.  Head and Neck Exam:  Normocephalic, atraumatic skull.  She has no ocular or oral lesions.  There are no palpable cervical or supraclavicular lymph nodes.  Lungs:  Clear bilaterally.  Cardiac Exam:  Regular rate and rhythm with  an occasional extra beat.  She has a systolic click from her aortic valve.  Abdomen: Soft.  She has good bowel sounds.  There is no fluid wave.  There is no palpable hepatosplenomegaly.  Breast Exam:  Right breast with no masses, edema, or erythema.  There is no right axillary adenopathy.  Left breast shows a well-healed lumpectomy at the 12 o'clock position.  She has no masses in the left breast.  There is no left axillary adenopathy.  Back Exam:  No tenderness over the spine, ribs, or hips.  There is no kyphosis or osteoporotic changes.  Extremities:  No clubbing, cyanosis, or edema.  She has good range motion of her joints.  She has good muscle strength in her upper and lower extremities.  Skin:  No rashes, ecchymoses, or petechiae.  LABORATORY  STUDIES:  White cell count is 5.3, hemoglobin 13.8, hematocrit 43.3, platelet count 291.  IMPRESSION:  Ms. Palau is a very charming 67 year old white female. She has a history of stage I infiltrating ductal carcinoma of the left breast.  She underwent lumpectomy back in June 2005.  She is on Femara. Her tumor was ER positive.  I do not see any evidence of tumor recurrence right now.  I think that her risk of recurrence will be less than 10%.  We will plan for another 52-month followup.    ______________________________ Josph Macho, M.D. PRE/MEDQ  D:  02/06/2013  T:  02/10/2013  Job:  [id number]

## 2013-02-12 NOTE — Progress Notes (Signed)
CC:   Gordy Savers, MD [name], M.D. [name], M.D.  DIAGNOSES: 1. Stage I (T1, N0, M0) ductal carcinoma of the left breast. 2. Mechanical aortic valve.  CURRENT THERAPY:  On Coumadin - lifelong.  INTERIM HISTORY:  Ms. Victor comes in for her yearly followup.  She has done very, very well.  Since we last saw her, she has really had no issues.  She had a mammogram back in February and everything looked okay at that point in time.  She has had no problems with bleeding or bruising.  She sees Dr. Elease Hashimoto. He also has her on a baby aspirin right now.  She has had no cough or shortness of breath.  There has been no leg swelling.  She has had no fever, sweats or chills.  PHYSICAL EXAMINATION:  General:  This is a well-developed, well- nourished white female, in no obvious distress.  Vital Signs: Temperature of 98.3, pulse 72, respiratory rate 14, blood pressure 138/80, weight is 133 pounds.  Head and neck:  Normocephalic, atraumatic skull.  She has no ocular or oral lesions.  There are no palpable cervical or supraclavicular lymph nodes.  Lungs:  Clear bilaterally. Cardiac:  Regular rate and rhythm with an occasional extra beat.  She has a systolic click from her aortic valve.  Abdomen:  Soft.  She has good bowel sounds.  There is no fluid wave.  There is no palpable hepatosplenomegaly.  Breasts:  Right breast with no masses, edema, or erythema.  There is no right axillary adenopathy.  Left breast shows well-healed lumpectomy at the 12 o'clock position.  She has no masses in the left breast.  There is no left axillary adenopathy.  Back:  No tenderness over the spine, ribs, or hips.  There is no kyphosis or osteoporotic changes.  Extremities:  No clubbing, cyanosis, or edema. She has good range of motion of her joints.  She has good muscle strength in her upper and lower extremities.  Skin:  No rashes, ecchymosis, or petechia.  LABORATORY STUDIES:  White cell  count is 5.3, hemoglobin 13.8, hematocrit 42.3, platelet count 291.  IMPRESSION:  Ms. Panchal is a very charming 67 year old white female. She has a history of stage I infiltrating ductal carcinoma of the left breast.  She underwent lumpectomy back in June of 2005.  She is on Femara.  Her tumor was ER positive.  I do not see any evidence of tumor recurrence right now.  I think that her risk of recurrence will be less than 10%.  We will plan for another 72-month followup.    ______________________________ Josph Macho, M.D. PRE/MEDQ  D:  02/06/2013  T:  02/10/2013  Job:  [id number]

## 2013-02-21 ENCOUNTER — Ambulatory Visit (INDEPENDENT_AMBULATORY_CARE_PROVIDER_SITE_OTHER): Payer: Medicare Other | Admitting: *Deleted

## 2013-02-21 DIAGNOSIS — I359 Nonrheumatic aortic valve disorder, unspecified: Secondary | ICD-10-CM

## 2013-02-21 DIAGNOSIS — Z7901 Long term (current) use of anticoagulants: Secondary | ICD-10-CM

## 2013-02-21 LAB — POCT INR: INR: 2.5

## 2013-02-26 ENCOUNTER — Telehealth: Payer: Self-pay | Admitting: *Deleted

## 2013-02-26 NOTE — Telephone Encounter (Signed)
Talked with pt and gave information that Dr Elease Hashimoto agreed to have her stop her ASA  since had bloody nose and bruising and she stated understanding.

## 2013-02-26 NOTE — Telephone Encounter (Signed)
Message copied by Jeannine Kitten on Tue Feb 26, 2013  9:23 AM ------      Message from: [name]      Created: Sun Feb 24, 2013  7:05 PM       Agree with stopping ASA since it was causing her to have bloody nose and bruising.                  ----- Message -----         From: Jeannine Kitten, RN         Sent: 02/21/2013   8:52 AM           To: Alois Cliche, LPN, [name], MD            Saw pt in coumadin clinic today and she informed me  that on last visit to Dr Elease Hashimoto that she was instructed to start Baby ASA and take 3 times a week Pt states she did not start Baby ASA until Dec 5th due to her fear of beginning this and took on on the 5th , 8th , and 10th then on the 11th noted large bruised area inner aspect on right lower leg and also states noted some bleeding when she blew her nose so she stopped taking the Baby ASA. The bruised area is fading and her INR today was 2.5. Just wanted to give you this information.      Thanks       Lelon Perla RN       ------

## 2013-03-27 ENCOUNTER — Other Ambulatory Visit: Payer: Self-pay

## 2013-03-27 DIAGNOSIS — Z9889 Other specified postprocedural states: Secondary | ICD-10-CM

## 2013-03-27 DIAGNOSIS — Z853 Personal history of malignant neoplasm of breast: Secondary | ICD-10-CM

## 2013-03-27 DIAGNOSIS — Z1231 Encounter for screening mammogram for malignant neoplasm of breast: Secondary | ICD-10-CM

## 2013-04-04 ENCOUNTER — Ambulatory Visit (INDEPENDENT_AMBULATORY_CARE_PROVIDER_SITE_OTHER): Payer: Medicare Other

## 2013-04-04 DIAGNOSIS — I359 Nonrheumatic aortic valve disorder, unspecified: Secondary | ICD-10-CM

## 2013-04-04 DIAGNOSIS — Z7901 Long term (current) use of anticoagulants: Secondary | ICD-10-CM

## 2013-04-04 LAB — POCT INR: INR: 3

## 2013-04-10 ENCOUNTER — Encounter: Payer: Self-pay | Admitting: Cardiovascular Disease

## 2013-04-16 ENCOUNTER — Other Ambulatory Visit: Payer: Self-pay | Admitting: Internal Medicine

## 2013-04-30 ENCOUNTER — Ambulatory Visit: Payer: Medicare Other

## 2013-05-10 ENCOUNTER — Ambulatory Visit
Admission: RE | Admit: 2013-05-10 | Discharge: 2013-05-10 | Disposition: A | Discharge status: Home / Self Care | Payer: 59 | Source: Ambulatory Visit

## 2013-05-10 ENCOUNTER — Ambulatory Visit (INDEPENDENT_AMBULATORY_CARE_PROVIDER_SITE_OTHER): Payer: 59

## 2013-05-10 DIAGNOSIS — I359 Nonrheumatic aortic valve disorder, unspecified: Secondary | ICD-10-CM

## 2013-05-10 DIAGNOSIS — Z1231 Encounter for screening mammogram for malignant neoplasm of breast: Secondary | ICD-10-CM

## 2013-05-10 DIAGNOSIS — Z853 Personal history of malignant neoplasm of breast: Secondary | ICD-10-CM

## 2013-05-10 DIAGNOSIS — Z9889 Other specified postprocedural states: Secondary | ICD-10-CM

## 2013-05-10 DIAGNOSIS — Z7901 Long term (current) use of anticoagulants: Secondary | ICD-10-CM

## 2013-05-10 LAB — POCT INR: INR: 3.1

## 2013-06-02 ENCOUNTER — Other Ambulatory Visit: Payer: Self-pay | Admitting: Internal Medicine

## 2013-06-04 ENCOUNTER — Encounter: Payer: Medicare Other | Admitting: Internal Medicine

## 2013-06-05 LAB — PROTIME-INR: INR: 2.6 — AB (ref 0.9–1.1)

## 2013-06-07 ENCOUNTER — Ambulatory Visit (INDEPENDENT_AMBULATORY_CARE_PROVIDER_SITE_OTHER): Payer: 59 | Admitting: Cardiovascular Disease

## 2013-06-07 DIAGNOSIS — I359 Nonrheumatic aortic valve disorder, unspecified: Secondary | ICD-10-CM

## 2013-06-07 DIAGNOSIS — Z7901 Long term (current) use of anticoagulants: Secondary | ICD-10-CM

## 2013-06-08 ENCOUNTER — Other Ambulatory Visit: Payer: Self-pay | Admitting: Internal Medicine

## 2013-06-20 ENCOUNTER — Encounter: Payer: Self-pay | Admitting: Internal Medicine

## 2013-06-20 ENCOUNTER — Ambulatory Visit (INDEPENDENT_AMBULATORY_CARE_PROVIDER_SITE_OTHER): Payer: Medicare Other | Admitting: Internal Medicine

## 2013-06-20 VITALS — BP 120/80 | HR 61 | Temp 98.2°F | Resp 18 | Ht 61.5 in | Wt 129.0 lb

## 2013-06-20 DIAGNOSIS — M818 Other osteoporosis without current pathological fracture: Secondary | ICD-10-CM

## 2013-06-20 DIAGNOSIS — Z0000 Encounter for general adult medical examination without abnormal findings: Secondary | ICD-10-CM

## 2013-06-20 DIAGNOSIS — I10 Essential (primary) hypertension: Secondary | ICD-10-CM

## 2013-06-20 DIAGNOSIS — T386X5A Adverse effect of antigonadotrophins, antiestrogens, antiandrogens, not elsewhere classified, initial encounter: Secondary | ICD-10-CM

## 2013-06-20 DIAGNOSIS — T38805A Adverse effect of unspecified hormones and synthetic substitutes, initial encounter: Secondary | ICD-10-CM

## 2013-06-20 DIAGNOSIS — Z952 Presence of prosthetic heart valve: Secondary | ICD-10-CM

## 2013-06-20 DIAGNOSIS — Z954 Presence of other heart-valve replacement: Secondary | ICD-10-CM

## 2013-06-20 DIAGNOSIS — G4701 Insomnia due to medical condition: Secondary | ICD-10-CM

## 2013-06-20 DIAGNOSIS — E785 Hyperlipidemia, unspecified: Secondary | ICD-10-CM

## 2013-06-20 DIAGNOSIS — Z23 Encounter for immunization: Secondary | ICD-10-CM

## 2013-06-20 DIAGNOSIS — I359 Nonrheumatic aortic valve disorder, unspecified: Secondary | ICD-10-CM

## 2013-06-20 MED ORDER — TEMAZEPAM 30 MG PO CAPS
30.0000 mg | ORAL_CAPSULE | Freq: Every evening | ORAL | Status: DC | PRN
Start: 2013-06-20 — End: 2013-08-07

## 2013-06-20 NOTE — Progress Notes (Signed)
Pre-visit discussion using our clinic review tool. No additional management support is needed unless otherwise documented below in the visit note.  

## 2013-06-20 NOTE — Patient Instructions (Addendum)
Limit your sodium (Salt) intake    It is important that you exercise regularly, at least 20 minutes 3 to 4 times per week.  If you develop chest pain or shortness of breath seek  medical attention.  Return in one year for follow-up  Take a calcium supplement, plus 800-1200 units of vitamin D 

## 2013-06-20 NOTE — Progress Notes (Signed)
Patient ID: Amanda Wiggins, female   DOB: 05/21/1945, 68 y.o.   MRN: [id number]  Subjective:    Patient ID: Amanda Wiggins, female    DOB: 08-22-1945, 68 y.o.   MRN: [id number]  HPI  68 year old patient seen today for a annual preventive health exam.  Patient has had screen lab and has seen cardiology in July and over the past year.  She remains quite stable.  She was given a brace trial of additional aspirin therapy, which she tolerated poorly with excessive bruising   Review of Systems  Constitutional: Negative for fever, appetite change, fatigue and unexpected weight change.  HENT: Negative for congestion, dental problem, ear pain, hearing loss, mouth sores, nosebleeds, sinus pressure, sore throat, tinnitus, trouble swallowing and voice change.   Eyes: Negative for photophobia, pain, redness and visual disturbance.  Respiratory: Negative for cough, chest tightness and shortness of breath.   Cardiovascular: Negative for chest pain, palpitations and leg swelling.  Gastrointestinal: Negative for nausea, vomiting, abdominal pain, diarrhea, constipation, blood in stool, abdominal distention and rectal pain.  Genitourinary: Negative for dysuria, urgency, frequency, hematuria, flank pain, vaginal bleeding, vaginal discharge, difficulty urinating, genital sores, vaginal pain, menstrual problem and pelvic pain.  Musculoskeletal: Positive for arthralgias. Negative for back pain and neck stiffness.  Skin: Negative for rash.  Neurological: Negative for dizziness, syncope, speech difficulty, weakness, light-headedness, numbness and headaches.  Hematological: Negative for adenopathy. Does not bruise/bleed easily.  Psychiatric/Behavioral: Negative for suicidal ideas, behavioral problems, self-injury, dysphoric mood and agitation. The patient is not nervous/anxious.        Objective:   Physical Exam  Constitutional: She is oriented to person, place, and time. She appears well-developed and  well-nourished.  HENT:  Head: Normocephalic and atraumatic.  Right Ear: External ear normal.  Left Ear: External ear normal.  Mouth/Throat: Oropharynx is clear and moist.  Eyes: Conjunctivae and EOM are normal.  Neck: Normal range of motion. Neck supple. No JVD present. No thyromegaly present.  Cardiovascular: Normal rate, regular rhythm, normal heart sounds and intact distal pulses.   No murmur heard. Prosthetic heart sounds  Pulmonary/Chest: Effort normal and breath sounds normal. She has no wheezes. She has no rales.  Abdominal: Soft. Bowel sounds are normal. She exhibits no distension and no mass. There is no tenderness. There is no rebound and no guarding.  Musculoskeletal: Normal range of motion. She exhibits no edema and no tenderness.  Neurological: She is alert and oriented to person, place, and time. She has normal reflexes. No cranial nerve deficit. She exhibits normal muscle tone. Coordination normal.  Skin: Skin is warm and dry. No rash noted.  Psychiatric: She has a normal mood and affect. Her behavior is normal.          Assessment & Plan:   Subjective:    Patient ID: Amanda Wiggins, female    DOB: 06-05-1945, 68 y.o.   MRN: [id number]  HPI  68 year-old patient who is seen today for a health maintenance examination. She is followed by multiple consultants including cardiology oncology OB/GYN general surgery and ENT. She is doing reasonably well. She is now  Approximately 10  years out from aortic valve replacement surgery. Since her last visit here osteopenia has progressed to osteoporosis secondary to aromatase inhibition. She is now receiving annual reclast injections   Allergies:  1) ! Demerol  2) Sulfamethoxazole (Sulfamethoxazole)   Past History:  Past Medical History:  Hyperlipidemia  Hypertension  Breast cancer, hx of  GERD  Osteoporosis status post aortic valve repair  migraine headaches  menopausal syndrome   Past Surgical History:  Lumpectomy  breast cancer 2005  Aortic Valve Replacement: 2003  Hysterectomy,endometrial cancer 2001  Right shoulder surgery  Nissan fundoplication 8676, post-op hematoma on heparin  colonoscopy 11-08   Family History:   father status post aortic valve surgery  mother status post MI, age 31  Both parents are still living in her 10s One brother, history of hypercholesterolemia, osteoarthritis  one sister  No FH of Colon Cancer   Social History:   married,  Patient has never smoked.  Alcohol Use - socially  Occupation: Retired  Illicit Drug Use - no  Patient gets regular exercise.   Past Medical History  Diagnosis Date  . Hyperlipidemia   . Hypertension   . Hx of breast cancer   . GERD (gastroesophageal reflux disease)   . Osteopenia   . Status post aortic valve repair   . FHx: migraine headaches   . Menopausal syndrome    Past Surgical History  Procedure Date  . Lummpectomy breast cancer 2005  . Aortic valve replacement 2003  . Hysterectomy, endometiral cancer 2001  . Right shouler surgery   . Nissan fundoplicaiton 7209    post-op hematoma on heparin     reports that she has never smoked. She has never used smokeless tobacco. She reports that she drinks alcohol. She reports that she does not use illicit drugs. family history includes Heart attack (age of onset:59) in her mother and Osteoarthritis in her brother. Allergies  Allergen Reactions  . Meperidine Hcl   . Sulfamethoxazole     REACTION: unspecified   1. Risk factors, based on past  M,S,F history- cardiovascular risk factors include hypertension and dyslipidemia. She is status post aortic valve repair  2.  Physical activities: No exercise limitations  3.  Depression/mood: No history of depression or mood disorder  4.  Hearing: No deficits  5.  ADL's: Independent in all aspects of daily living  6.  Fall risk: Low  7.  Home safety: No proms identifying  8.  Height weight, and visual acuity; height and weight  stable no change in visual acuity  9.  Counseling: Regular exercise program encouraged  10. Lab orders based on risk factors: Will check a lipid profile and TSH  11. Referral : Followup GYN cardiology oncology  12. Care plan: Continue present regimen. Follow multiple consultants  13. Cognitive assessment: Alert and oriented with normal affect. No cognitive dysfunction      Assessment & Plan:    Preventive health examination. Status post aortic valve repair Osteoporosis Hypertension Dyslipidemia. We'll check a lipid profile Remote breast and endometrial cancer  Recheck 1 year Follow multiple consultants

## 2013-06-21 ENCOUNTER — Telehealth: Payer: Self-pay | Admitting: Internal Medicine

## 2013-06-21 NOTE — Telephone Encounter (Signed)
Relevant patient education assigned to patient using Emmi. ° °

## 2013-07-19 ENCOUNTER — Ambulatory Visit (INDEPENDENT_AMBULATORY_CARE_PROVIDER_SITE_OTHER): Payer: 59 | Admitting: *Deleted

## 2013-07-19 DIAGNOSIS — I359 Nonrheumatic aortic valve disorder, unspecified: Secondary | ICD-10-CM

## 2013-07-19 DIAGNOSIS — Z7901 Long term (current) use of anticoagulants: Secondary | ICD-10-CM

## 2013-07-19 DIAGNOSIS — Z5181 Encounter for therapeutic drug level monitoring: Secondary | ICD-10-CM | POA: Insufficient documentation

## 2013-07-19 LAB — POCT INR: INR: 2.6

## 2013-07-24 ENCOUNTER — Other Ambulatory Visit: Payer: Self-pay | Admitting: Internal Medicine

## 2013-08-07 ENCOUNTER — Encounter: Payer: Self-pay | Admitting: Hematology & Oncology

## 2013-08-07 ENCOUNTER — Ambulatory Visit (HOSPITAL_BASED_OUTPATIENT_CLINIC_OR_DEPARTMENT_OTHER): Payer: 59 | Admitting: Hematology & Oncology

## 2013-08-07 ENCOUNTER — Other Ambulatory Visit: Payer: Self-pay | Admitting: Hematology & Oncology

## 2013-08-07 ENCOUNTER — Other Ambulatory Visit (HOSPITAL_BASED_OUTPATIENT_CLINIC_OR_DEPARTMENT_OTHER): Payer: 59 | Admitting: Lab

## 2013-08-07 ENCOUNTER — Ambulatory Visit (HOSPITAL_BASED_OUTPATIENT_CLINIC_OR_DEPARTMENT_OTHER): Payer: 59

## 2013-08-07 VITALS — BP 130/100 | HR 64 | Temp 98.2°F | Resp 16 | Wt 126.0 lb

## 2013-08-07 DIAGNOSIS — E2839 Other primary ovarian failure: Secondary | ICD-10-CM

## 2013-08-07 DIAGNOSIS — T386X5A Adverse effect of antigonadotrophins, antiestrogens, antiandrogens, not elsewhere classified, initial encounter: Principal | ICD-10-CM

## 2013-08-07 DIAGNOSIS — Z780 Asymptomatic menopausal state: Secondary | ICD-10-CM

## 2013-08-07 DIAGNOSIS — M818 Other osteoporosis without current pathological fracture: Secondary | ICD-10-CM

## 2013-08-07 DIAGNOSIS — C549 Malignant neoplasm of corpus uteri, unspecified: Secondary | ICD-10-CM

## 2013-08-07 DIAGNOSIS — I359 Nonrheumatic aortic valve disorder, unspecified: Secondary | ICD-10-CM

## 2013-08-07 DIAGNOSIS — M810 Age-related osteoporosis without current pathological fracture: Secondary | ICD-10-CM

## 2013-08-07 DIAGNOSIS — T38805A Adverse effect of unspecified hormones and synthetic substitutes, initial encounter: Secondary | ICD-10-CM

## 2013-08-07 DIAGNOSIS — G4701 Insomnia due to medical condition: Secondary | ICD-10-CM

## 2013-08-07 DIAGNOSIS — Z853 Personal history of malignant neoplasm of breast: Secondary | ICD-10-CM

## 2013-08-07 LAB — CBC WITH DIFFERENTIAL (CANCER CENTER ONLY)
BASO#: 0 10*3/uL (ref 0.0–0.2)
BASO%: 0.4 % (ref 0.0–2.0)
EOS%: 2.7 % (ref 0.0–7.0)
Eosinophils Absolute: 0.1 10*3/uL (ref 0.0–0.5)
HEMATOCRIT: 43.1 % (ref 34.8–46.6)
HGB: 14.1 g/dL (ref 11.6–15.9)
LYMPH#: 1.2 10*3/uL (ref 0.9–3.3)
LYMPH%: 23.2 % (ref 14.0–48.0)
MCH: 28.5 pg (ref 26.0–34.0)
MCHC: 32.7 g/dL (ref 32.0–36.0)
MCV: 87 fL (ref 81–101)
MONO#: 0.5 10*3/uL (ref 0.1–0.9)
MONO%: 9.6 % (ref 0.0–13.0)
NEUT#: 3.4 10*3/uL (ref 1.5–6.5)
NEUT%: 64.1 % (ref 39.6–80.0)
Platelets: 303 10*3/uL (ref 145–400)
RBC: 4.95 10*6/uL (ref 3.70–5.32)
RDW: 13.2 % (ref 11.1–15.7)
WBC: 5.2 10*3/uL (ref 3.9–10.0)

## 2013-08-07 LAB — COMPREHENSIVE METABOLIC PANEL
ALT: 27 U/L (ref 0–35)
AST: 28 U/L (ref 0–37)
Albumin: 4.2 g/dL (ref 3.5–5.2)
Alkaline Phosphatase: 44 U/L (ref 39–117)
BUN: 20 mg/dL (ref 6–23)
CO2: 32 meq/L (ref 19–32)
Calcium: 10 mg/dL (ref 8.4–10.5)
Chloride: 102 mEq/L (ref 96–112)
Creatinine, Ser: 0.84 mg/dL (ref 0.50–1.10)
Glucose, Bld: 80 mg/dL (ref 70–99)
Potassium: 3.3 mEq/L — ABNORMAL LOW (ref 3.5–5.3)
Sodium: 142 mEq/L (ref 135–145)
Total Bilirubin: 0.3 mg/dL (ref 0.2–1.2)
Total Protein: 6.5 g/dL (ref 6.0–8.3)

## 2013-08-07 MED ORDER — SODIUM CHLORIDE 0.9 % IV SOLN
Freq: Once | INTRAVENOUS | Status: AC
  Administered 2013-08-07: 10:00:00 via INTRAVENOUS

## 2013-08-07 MED ORDER — TEMAZEPAM 30 MG PO CAPS: 30.0000 mg | ORAL_CAPSULE | Freq: Every evening | ORAL | Status: DC | PRN

## 2013-08-07 MED ORDER — ZOLEDRONIC ACID 4 MG/100ML IV SOLN
4.0000 mg | Freq: Once | INTRAVENOUS | Status: AC
Start: 2013-08-07 — End: 2013-08-07
  Administered 2013-08-07: 4 mg via INTRAVENOUS
  Filled 2013-08-07: qty 100

## 2013-08-07 NOTE — Patient Instructions (Signed)

## 2013-08-07 NOTE — Progress Notes (Signed)
Hematology and Oncology Follow Up Visit  Amanda Wiggins [id number] 1946/01/04 68 y.o. 08/07/2013   Principle Diagnosis:  1. Stage I (T1 N0 M0) ductal carcinoma of the left breast. 2. Mechanical aortic valve.  Current Therapy:    Coumadin-lifelong  Zometa 5 mg IV q. year     Interim History:  [name] is is back for followup. She is doing okay although she is under some stress. She had both parents passed away since last saw her. An aunt also passed away. She is in charge of all the legal paperwork and is still doing a lot of traveling to try to get things sorted out.  She has not had any problems with her a Coumadin. She's had no problems with her aortic valve.  She does have some osteoporosis. She is on the Zometa once a year. She needs to have a bone density test done.  She continues on Restoril to help with rest.  Medications: Current outpatient prescriptions:BIOTIN PO, Take by mouth every morning. , Disp: , Rfl: ;  Calcium Carbonate-Vitamin D (CALCIUM 600+D) 600-400 MG-UNIT per tablet, Take 1 tablet by mouth 2 (two) times daily.  , Disp: , Rfl: ;  chlorthalidone (HYGROTON) 25 MG tablet, TAKE 1 TABLET DAILY FOR FOUR DAYS PER WEEK, Disp: 90 tablet, Rfl: 1;  CRESTOR 10 MG tablet, TAKE 1 TABLET AT BEDTIME, Disp: 90 tablet, Rfl: 1 cycloSPORINE (RESTASIS) 0.05 % ophthalmic emulsion, Place 1 drop into both eyes 2 (two) times daily as needed. , Disp: , Rfl: ;  Glucosamine-Chondroit-Vit C-Mn (GLUCOSAMINE CHONDR 1500 COMPLX) CAPS, Take by mouth every morning., Disp: , Rfl: ;  KLOR-CON M20 20 MEQ tablet, TAKE 1 TABLET DAILY 4 DAYS PER WEEK, Disp: 90 tablet, Rfl: 3;  loratadine (CLARITIN) 10 MG tablet, Take 10 mg by mouth daily.  , Disp: , Rfl:  Multiple Vitamin (MULTIVITAMIN) capsule, Take 1 capsule by mouth daily.  , Disp: , Rfl: ;  omeprazole (PRILOSEC) 20 MG capsule, Take by mouth. TAKE 1 CAPSULE THREE DAYS A WEEK, Disp: , Rfl: ;  senna (SENOKOT) 8.6 MG tablet, Take 2 tablets by mouth daily.   , Disp: , Rfl: ;  SUMAtriptan (IMITREX) 25 MG tablet, TAKE 1 TABLET DAILY AS NEEDED, Disp: 4 tablet, Rfl: 5 temazepam (RESTORIL) 30 MG capsule, Take 1 capsule (30 mg total) by mouth at bedtime as needed., Disp: 90 capsule, Rfl: 3;  verapamil (CALAN-SR) 180 MG CR tablet, TAKE 1 TABLET DAILY, Disp: 90 tablet, Rfl: 0;  warfarin (COUMADIN) 5 MG tablet, TAKE 1 TABLET (5 MG TOTAL) BY MOUTH DAILY., Disp: 90 tablet, Rfl: 1;  verapamil (CALAN-SR) 180 MG CR tablet, TAKE 1 TABLET DAILY, Disp: 90 tablet, Rfl: 3  Allergies:  Allergies  Allergen Reactions  . Latex Hives  . Meperidine Hcl Nausea And Vomiting  . Sulfamethoxazole Nausea And Vomiting    REACTION: unspecified    Past Medical History, Surgical history, Social history, and Family History were reviewed and updated.  Review of Systems: As above  Physical Exam:  weight is 126 lb (57.153 kg). Her oral temperature is 98.2 F (36.8 C). Her blood pressure is 130/100 and her pulse is 64. Her respiration is 16.   Lungs are clear. Cardiac exam regular rhythm. She has a systolic click from her mechanical aortic valve. Current breast exam shows right breast no masses edema or erythema. The right axillar adenopathy. Left breast is well-healed a vitamin B12 opposition. There is no left breast as per is no left  axillary adenopathy. Abdomen is soft tissues good bowel sounds. There is no palpable liver or spleen. Extremities shows no clubbing cyanosis or edema. Skin exam no rashes.  Lab Results  Component Value Date   WBC 5.2 08/07/2013   HGB 14.1 08/07/2013   HCT 43.1 08/07/2013   MCV 87 08/07/2013   PLT 303 08/07/2013     Chemistry      Component Value Date/Time   NA 141 02/06/2013 0854   K 3.2* 02/06/2013 0854   CL 102 02/06/2013 0854   CO2 30 02/06/2013 0854   BUN 18 02/06/2013 0854   CREATININE 0.78 02/06/2013 0854      Component Value Date/Time   CALCIUM 9.3 02/06/2013 0854   ALKPHOS 54 02/06/2013 0854   AST 28 02/06/2013 0854   ALT 27 02/06/2013 0854    BILITOT 0.4 02/06/2013 0854         Impression and Plan: Amanda Wiggins is 31 her old female with a history of stage I infiltrating duct carcinoma the left breast. She underwent lumpectomy and. She had been on Femara. She had radiation. It has been 10  years now.  We'll plan for another followup in one year. I do not see  that we have to do any additional studies on her right now. We will go ahead and get the bone density test done.   Amanda Napoleon, MD 6/3/201511:51 AM

## 2013-08-08 ENCOUNTER — Telehealth: Payer: Self-pay | Admitting: *Deleted

## 2013-08-08 NOTE — Telephone Encounter (Addendum)
Message copied by Lenn Sink on Thu Aug 08, 2013  8:38 AM ------      Message from: Burney Gauze R      Created: Wed Aug 07, 2013  6:37 PM       Call - labs look good!!! Laurey Arrow ------Informed pt that labs look good.

## 2013-08-13 ENCOUNTER — Ambulatory Visit
Admission: RE | Admit: 2013-08-13 | Discharge: 2013-08-13 | Disposition: A | Discharge status: Home / Self Care | Payer: Medicare Other | Source: Ambulatory Visit | Attending: Hematology & Oncology | Admitting: Hematology & Oncology

## 2013-08-13 DIAGNOSIS — Z780 Asymptomatic menopausal state: Secondary | ICD-10-CM

## 2013-08-13 DIAGNOSIS — E2839 Other primary ovarian failure: Secondary | ICD-10-CM

## 2013-08-15 ENCOUNTER — Encounter: Payer: Self-pay | Admitting: *Deleted

## 2013-08-27 ENCOUNTER — Ambulatory Visit (INDEPENDENT_AMBULATORY_CARE_PROVIDER_SITE_OTHER): Payer: Medicare Other | Admitting: Pharmacist

## 2013-08-27 DIAGNOSIS — I359 Nonrheumatic aortic valve disorder, unspecified: Secondary | ICD-10-CM

## 2013-08-27 DIAGNOSIS — Z5181 Encounter for therapeutic drug level monitoring: Secondary | ICD-10-CM

## 2013-08-27 DIAGNOSIS — Z7901 Long term (current) use of anticoagulants: Secondary | ICD-10-CM

## 2013-08-27 LAB — POCT INR: INR: 2.4

## 2013-10-08 ENCOUNTER — Ambulatory Visit (INDEPENDENT_AMBULATORY_CARE_PROVIDER_SITE_OTHER): Payer: Medicare Other

## 2013-10-08 DIAGNOSIS — I359 Nonrheumatic aortic valve disorder, unspecified: Secondary | ICD-10-CM

## 2013-10-08 DIAGNOSIS — Z5181 Encounter for therapeutic drug level monitoring: Secondary | ICD-10-CM

## 2013-10-08 DIAGNOSIS — Z7901 Long term (current) use of anticoagulants: Secondary | ICD-10-CM

## 2013-10-08 LAB — POCT INR: INR: 2.2

## 2013-11-18 ENCOUNTER — Ambulatory Visit (INDEPENDENT_AMBULATORY_CARE_PROVIDER_SITE_OTHER): Payer: Medicare Other | Admitting: *Deleted

## 2013-11-18 DIAGNOSIS — Z7901 Long term (current) use of anticoagulants: Secondary | ICD-10-CM

## 2013-11-18 DIAGNOSIS — I359 Nonrheumatic aortic valve disorder, unspecified: Secondary | ICD-10-CM

## 2013-11-18 DIAGNOSIS — Z5181 Encounter for therapeutic drug level monitoring: Secondary | ICD-10-CM

## 2013-11-18 LAB — POCT INR: INR: 1.7

## 2013-12-08 ENCOUNTER — Other Ambulatory Visit: Payer: Self-pay | Admitting: Internal Medicine

## 2013-12-16 ENCOUNTER — Ambulatory Visit (INDEPENDENT_AMBULATORY_CARE_PROVIDER_SITE_OTHER): Payer: Medicare Other

## 2013-12-16 DIAGNOSIS — Z5181 Encounter for therapeutic drug level monitoring: Secondary | ICD-10-CM

## 2013-12-16 DIAGNOSIS — I359 Nonrheumatic aortic valve disorder, unspecified: Secondary | ICD-10-CM

## 2013-12-16 DIAGNOSIS — Z7901 Long term (current) use of anticoagulants: Secondary | ICD-10-CM

## 2013-12-16 LAB — POCT INR: INR: 2.7

## 2013-12-20 ENCOUNTER — Other Ambulatory Visit: Payer: Self-pay

## 2013-12-30 ENCOUNTER — Other Ambulatory Visit (INDEPENDENT_AMBULATORY_CARE_PROVIDER_SITE_OTHER): Payer: Medicare Other | Admitting: *Deleted

## 2013-12-30 ENCOUNTER — Other Ambulatory Visit: Payer: Self-pay | Admitting: Gynecology

## 2013-12-30 ENCOUNTER — Other Ambulatory Visit: Payer: Self-pay | Admitting: Nurse Practitioner

## 2013-12-30 DIAGNOSIS — E785 Hyperlipidemia, unspecified: Secondary | ICD-10-CM

## 2013-12-30 LAB — BASIC METABOLIC PANEL
BUN: 13 mg/dL (ref 6–23)
CO2: 34 mEq/L — ABNORMAL HIGH (ref 19–32)
Calcium: 9 mg/dL (ref 8.4–10.5)
Chloride: 101 mEq/L (ref 96–112)
Creatinine, Ser: 1 mg/dL (ref 0.4–1.2)
GFR: 62.38 mL/min (ref >60.00)
Glucose, Bld: 91 mg/dL (ref 70–99)
POTASSIUM: 3.5 meq/L (ref 3.5–5.1)
SODIUM: 138 meq/L (ref 135–145)

## 2013-12-30 LAB — LIPID PANEL
CHOL/HDL RATIO: 3
Cholesterol: 177 mg/dL (ref 0–200)
HDL: 54.6 mg/dL (ref >39.00)
LDL Cholesterol: 88 mg/dL (ref 0–99)
NonHDL: 122.4
TRIGLYCERIDES: 170 mg/dL — AB (ref 0.0–149.0)
VLDL: 34 mg/dL (ref 0.0–40.0)

## 2013-12-30 LAB — HEPATIC FUNCTION PANEL
ALT: 31 U/L (ref 0–35)
AST: 30 U/L (ref 0–37)
Albumin: 3.7 g/dL (ref 3.5–5.2)
Alkaline Phosphatase: 45 U/L (ref 39–117)
BILIRUBIN DIRECT: 0 mg/dL (ref 0.0–0.3)
BILIRUBIN TOTAL: 0.4 mg/dL (ref 0.2–1.2)
Total Protein: 7.2 g/dL (ref 6.0–8.3)

## 2013-12-31 LAB — CYTOLOGY - PAP

## 2014-01-13 ENCOUNTER — Ambulatory Visit: Payer: Medicare Other | Admitting: Cardiovascular Disease

## 2014-01-16 ENCOUNTER — Other Ambulatory Visit: Payer: Self-pay | Admitting: Internal Medicine

## 2014-01-17 ENCOUNTER — Encounter: Payer: Self-pay | Admitting: Cardiovascular Disease

## 2014-01-17 ENCOUNTER — Ambulatory Visit (INDEPENDENT_AMBULATORY_CARE_PROVIDER_SITE_OTHER): Payer: Medicare Other | Admitting: Cardiovascular Disease

## 2014-01-17 ENCOUNTER — Ambulatory Visit (INDEPENDENT_AMBULATORY_CARE_PROVIDER_SITE_OTHER): Payer: Medicare Other | Admitting: *Deleted

## 2014-01-17 VITALS — BP 140/94 | HR 67 | Ht 61.5 in | Wt 129.0 lb

## 2014-01-17 DIAGNOSIS — Z954 Presence of other heart-valve replacement: Secondary | ICD-10-CM

## 2014-01-17 DIAGNOSIS — Z7901 Long term (current) use of anticoagulants: Secondary | ICD-10-CM

## 2014-01-17 DIAGNOSIS — Z952 Presence of prosthetic heart valve: Secondary | ICD-10-CM

## 2014-01-17 DIAGNOSIS — I359 Nonrheumatic aortic valve disorder, unspecified: Secondary | ICD-10-CM

## 2014-01-17 DIAGNOSIS — Z5181 Encounter for therapeutic drug level monitoring: Secondary | ICD-10-CM

## 2014-01-17 DIAGNOSIS — I10 Essential (primary) hypertension: Secondary | ICD-10-CM

## 2014-01-17 LAB — POCT INR: INR: 2.4

## 2014-01-17 MED ORDER — POTASSIUM CHLORIDE CRYS ER 20 MEQ PO TBCR: 20.0000 meq | EXTENDED_RELEASE_TABLET | Freq: Every day | ORAL | Status: DC

## 2014-01-17 NOTE — Patient Instructions (Signed)
Your physician has recommended you make the following change in your medication:  MAKE CERTAIN you are taking KDur daily  Your physician wants you to follow-up in: 1 year with Dr. Acie Fredrickson.  You will receive a reminder letter in the mail two months in advance. If you don't receive a letter, please call our office to schedule the follow-up appointment.

## 2014-01-17 NOTE — Assessment & Plan Note (Addendum)
Amanda Wiggins is doing well from a cardiac standpoint. INR levels are ok.  We tried to add ASA 81 mg a day but she had significant bleeding and bruising .  She stopped it and her bruising improved.   No CP or dyspnea.  Will see her in 1 year .

## 2014-01-17 NOTE — Progress Notes (Signed)
Amanda Wiggins Date of Birth  Dec 20, 1945 [facility] [address]    [place], [place]  [address] [phone]  Fax  [phone]  Problem List 1. Aortic valve replacement 2. Hyperlipidemia 3. Breast cancer   History of Present Illness:  Amanda Wiggins is a 68 year old female with a history of aortic stenosis-status post aortic valve replacement.  She also has a history of hypercholesterolemia.  She's not had any episodes of chest pain or shortness of breath.  She's had her INR levels checked in our Coumadin clinic. Her INR levels have all been therapeutic.  Nov. 6 , 2014  Amanda Wiggins is doing well.  i saw her a year ago.  She is 10 years our from her breast cancer and is doing well.    No cardiac complaints.   Able to do all of her normal activities.     Nov. 13, 2015:  Amanda Wiggins is doing well.   No CP .  No dyspnea.   Has been stressful.  Her mother died this past year.   Her INR levels have been  Well controlled    Current Outpatient Prescriptions on File Prior to Visit  Medication Sig Dispense Refill  . BIOTIN PO Take by mouth every morning.     . Calcium Carbonate-Vitamin D (CALCIUM 600+D) 600-400 MG-UNIT per tablet Take 1 tablet by mouth 2 (two) times daily.      . chlorthalidone (HYGROTON) 25 MG tablet TAKE 1 TABLET DAILY FOR FOUR DAYS PER WEEK 90 tablet 1  . CRESTOR 10 MG tablet TAKE 1 TABLET AT BEDTIME 90 tablet 1  . cycloSPORINE (RESTASIS) 0.05 % ophthalmic emulsion Place 1 drop into both eyes 2 (two) times daily as needed.     . Glucosamine-Chondroit-Vit C-Mn (GLUCOSAMINE CHONDR 1500 COMPLX) CAPS Take by mouth every morning.    Marland Kitchen KLOR-CON M20 20 MEQ tablet TAKE 1 TABLET DAILY 4 DAYS PER WEEK 90 tablet 3  . loratadine (CLARITIN) 10 MG tablet Take 10 mg by mouth daily.      . Multiple Vitamin (MULTIVITAMIN) capsule Take 1 capsule by mouth daily.      Marland Kitchen omeprazole (PRILOSEC) 20 MG capsule Take by mouth. TAKE 1 CAPSULE THREE DAYS A WEEK    . senna (SENOKOT) 8.6 MG tablet  Take 2 tablets by mouth daily.      . SUMAtriptan (IMITREX) 25 MG tablet TAKE 1 TABLET DAILY AS NEEDED 4 tablet 5  . temazepam (RESTORIL) 30 MG capsule Take 1 capsule (30 mg total) by mouth at bedtime as needed. 90 capsule 3  . verapamil (CALAN-SR) 180 MG CR tablet TAKE 1 TABLET DAILY 90 tablet 3  . warfarin (COUMADIN) 5 MG tablet Take as directed by coumadin clinic 90 tablet 1   No current facility-administered medications on file prior to visit.    Allergies  Allergen Reactions  . Latex Hives  . Meperidine Hcl Nausea And Vomiting  . Sulfamethoxazole Nausea And Vomiting    REACTION: unspecified    Past Medical History  Diagnosis Date  . Hyperlipidemia   . Hypertension   . Hx of breast cancer   . GERD (gastroesophageal reflux disease)   . Osteopenia   . Status post aortic valve repair   . FHx: migraine headaches   . Menopausal syndrome   . Aortic valve replaced   . Hiatal hernia   . Hx Breast Cancer, IDC, Stage I 07/03/2003  . 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 08/22/2011    Past Surgical History  Procedure Laterality Date  . Lummpectomy breast cancer  07/03/2003  . Aortic valve replacement  2003  . Hysterectomy, endometiral cancer  2001  . Right shouler surgery    . Nissan fundoplicaiton  [id number]    post-op hematoma on heparin   . Hiatal hernia repair    . Cardiac catheterization      Ejection Fraction     History  Smoking status  . Never Smoker   Smokeless tobacco  . Never Used    History  Alcohol Use  . Yes    Comment: socially    Family History  Problem Relation Age of Onset  . Heart attack Mother 74  . Osteoarthritis Brother     hpercholesterolemia    Reviw of Systems:  Reviewed in the HPI.  All other systems are negative.  Physical Exam: BP 140/94 mmHg  Pulse 67  Ht 5' 1.5" (1.562 m)  Wt 129 lb (58.514 kg)  BMI 23.98 kg/m2 The patient is alert and oriented x 3.  The mood and affect are normal.   Skin: warm and dry.  Color is normal.     HEENT:   the sclera are nonicteric.  The mucous membranes are moist.  The carotids are 2+ without bruits.  There is no thyromegaly.  There is no JVD.    Lungs: clear.  The chest wall is non tender.    Heart: regular rate with a normal S1 and mechanical S2.  There are no murmurs, gallops, or rubs. The PMI is not displaced.     Abdomen: good bowel sounds.  There is no guarding or rebound.  There is no hepatosplenomegaly or tenderness.  There are no masses.   Extremities:  no clubbing, cyanosis, or edema.  The legs are without rashes.  The distal pulses are intact.   Neuro:  Cranial nerves II - XII are intact.  Motor and sensory functions are intact.    The gait is normal.  ECG: Nov. 13, 2015:  NSR at 66,  . NS ST abnormality  Assessment / Plan:

## 2014-01-17 NOTE — Assessment & Plan Note (Signed)
BP is typically ok A bit elevated today.  Continue current meds. I will see her again in 1 year.

## 2014-01-17 NOTE — Assessment & Plan Note (Signed)
Amanda Wiggins is doing well .  No cardiac symptoms . Continue current meds.

## 2014-02-05 ENCOUNTER — Encounter: Payer: Self-pay | Admitting: Hematology & Oncology

## 2014-02-05 ENCOUNTER — Other Ambulatory Visit (HOSPITAL_BASED_OUTPATIENT_CLINIC_OR_DEPARTMENT_OTHER): Payer: Medicare Other | Admitting: Lab

## 2014-02-05 ENCOUNTER — Ambulatory Visit (HOSPITAL_BASED_OUTPATIENT_CLINIC_OR_DEPARTMENT_OTHER): Payer: Medicare Other | Admitting: Hematology & Oncology

## 2014-02-05 VITALS — BP 122/86 | HR 56 | Temp 97.7°F | Resp 14 | Ht 61.0 in | Wt 133.0 lb

## 2014-02-05 DIAGNOSIS — G4701 Insomnia due to medical condition: Secondary | ICD-10-CM

## 2014-02-05 DIAGNOSIS — T386X5A Adverse effect of antigonadotrophins, antiestrogens, antiandrogens, not elsewhere classified, initial encounter: Secondary | ICD-10-CM

## 2014-02-05 DIAGNOSIS — M818 Other osteoporosis without current pathological fracture: Secondary | ICD-10-CM

## 2014-02-05 DIAGNOSIS — C549 Malignant neoplasm of corpus uteri, unspecified: Secondary | ICD-10-CM

## 2014-02-05 DIAGNOSIS — I359 Nonrheumatic aortic valve disorder, unspecified: Secondary | ICD-10-CM

## 2014-02-05 DIAGNOSIS — Z853 Personal history of malignant neoplasm of breast: Secondary | ICD-10-CM

## 2014-02-05 LAB — COMPREHENSIVE METABOLIC PANEL
ALK PHOS: 43 U/L (ref 39–117)
ALT: 29 U/L (ref 0–35)
AST: 30 U/L (ref 0–37)
Albumin: 4.3 g/dL (ref 3.5–5.2)
BUN: 18 mg/dL (ref 6–23)
CO2: 32 mEq/L (ref 19–32)
CREATININE: 0.96 mg/dL (ref 0.50–1.10)
Calcium: 9.3 mg/dL (ref 8.4–10.5)
Chloride: 101 mEq/L (ref 96–112)
Glucose, Bld: 97 mg/dL (ref 70–99)
Potassium: 3.6 mEq/L (ref 3.5–5.3)
Sodium: 140 mEq/L (ref 135–145)
Total Bilirubin: 0.3 mg/dL (ref 0.2–1.2)
Total Protein: 6.7 g/dL (ref 6.0–8.3)

## 2014-02-05 LAB — CBC WITH DIFFERENTIAL (CANCER CENTER ONLY)
BASO#: 0 10*3/uL (ref 0.0–0.2)
BASO%: 0.4 % (ref 0.0–2.0)
EOS%: 1.4 % (ref 0.0–7.0)
Eosinophils Absolute: 0.1 10*3/uL (ref 0.0–0.5)
HCT: 43.3 % (ref 34.8–46.6)
HEMOGLOBIN: 14.2 g/dL (ref 11.6–15.9)
LYMPH#: 1.2 10*3/uL (ref 0.9–3.3)
LYMPH%: 23.4 % (ref 14.0–48.0)
MCH: 28.6 pg (ref 26.0–34.0)
MCHC: 32.8 g/dL (ref 32.0–36.0)
MCV: 87 fL (ref 81–101)
MONO#: 0.5 10*3/uL (ref 0.1–0.9)
MONO%: 8.7 % (ref 0.0–13.0)
NEUT%: 66.1 % (ref 39.6–80.0)
NEUTROS ABS: 3.4 10*3/uL (ref 1.5–6.5)
Platelets: 299 10*3/uL (ref 145–400)
RBC: 4.96 10*6/uL (ref 3.70–5.32)
RDW: 13.4 % (ref 11.1–15.7)
WBC: 5.2 10*3/uL (ref 3.9–10.0)

## 2014-02-05 LAB — PROTIME-INR (CHCC SATELLITE)
INR: 2.7 (ref 2.0–3.5)
Protime: 32.4 Seconds — ABNORMAL HIGH (ref 10.6–13.4)

## 2014-02-05 MED ORDER — TEMAZEPAM 30 MG PO CAPS: 30.0000 mg | ORAL_CAPSULE | Freq: Every evening | ORAL | Status: DC | PRN

## 2014-02-05 NOTE — Progress Notes (Signed)
Hematology and Oncology Follow Up Visit  Amanda Wiggins [id number] 1945-10-07 68 y.o. 02/05/2014   Principle Diagnosis:  1. Stage I (T1 N0 M0) ductal carcinoma of the left breast. 2. Mechanical aortic valve.  Current Therapy:    Coumadin-lifelong  Zometa 5 mg IV q. year     Interim History:  Amanda Wiggins is is back for followup. She is doing okay although she is still under some stress. She had both parents passed away within the past year. She try to deal with the legal issues..   She did have a nice Thanksgiving.  Several of her family members have recently been diagnosed with cancer. She's trying to help them out as much as possible. She had a bone density study done back in June of this year. This did show some osteopenia. We have her on Zometa yearly. She will get this in June 2016.   She has not had any problems with her a Coumadin. She's had no problems with her aortic valve.  She continues on Restoril to help with rest. Of course, her insurance company will no longer carry Restoril. She does not want take anything else. I think it's okay for her to continue the Restoril since it works for her.  Medications: Current outpatient prescriptions: BIOTIN PO, Take by mouth every morning. , Disp: , Rfl: ;  Calcium Carbonate-Vitamin D (CALCIUM 600+D) 600-400 MG-UNIT per tablet, Take 1 tablet by mouth 2 (two) times daily.  , Disp: , Rfl: ;  chlorthalidone (HYGROTON) 25 MG tablet, TAKE 1 TABLET DAILY FOR FOUR DAYS PER WEEK, Disp: 90 tablet, Rfl: 1;  CRESTOR 10 MG tablet, TAKE 1 TABLET AT BEDTIME, Disp: 90 tablet, Rfl: 1 cycloSPORINE (RESTASIS) 0.05 % ophthalmic emulsion, Place 1 drop into both eyes 2 (two) times daily as needed. , Disp: , Rfl: ;  Glucosamine-Chondroit-Vit C-Mn (GLUCOSAMINE CHONDR 1500 COMPLX) CAPS, Take by mouth every morning., Disp: , Rfl: ;  loratadine (CLARITIN) 10 MG tablet, Take 10 mg by mouth daily.  , Disp: , Rfl: ;  Multiple Vitamin (MULTIVITAMIN) capsule, Take 1  capsule by mouth daily.  , Disp: , Rfl:  omeprazole (PRILOSEC) 20 MG capsule, Take by mouth. TAKE 1 CAPSULE THREE DAYS A WEEK, Disp: , Rfl: ;  potassium chloride SA (KLOR-CON M20) 20 MEQ tablet, Take 1 tablet (20 mEq total) by mouth daily., Disp: 90 tablet, Rfl: 3;  senna (SENOKOT) 8.6 MG tablet, Take 2 tablets by mouth daily.  , Disp: , Rfl: ;  SUMAtriptan (IMITREX) 25 MG tablet, TAKE 1 TABLET DAILY AS NEEDED, Disp: 4 tablet, Rfl: 5 temazepam (RESTORIL) 30 MG capsule, Take 1 capsule (30 mg total) by mouth at bedtime as needed., Disp: 90 capsule, Rfl: 3;  verapamil (CALAN-SR) 180 MG CR tablet, TAKE 1 TABLET DAILY, Disp: 90 tablet, Rfl: 3;  warfarin (COUMADIN) 5 MG tablet, Take as directed by coumadin clinic, Disp: 90 tablet, Rfl: 1  Allergies:  Allergies  Allergen Reactions  . Latex Hives  . Meperidine Hcl Nausea And Vomiting  . Sulfamethoxazole Nausea And Vomiting    REACTION: unspecified    Past Medical History, Surgical history, Social history, and Family History were reviewed and updated.  Review of Systems: As above  Physical Exam:  height is 5\' 1"  (1.549 m) and weight is 133 lb (60.328 kg). Her oral temperature is 97.7 F (36.5 C). Her blood pressure is 122/86 and her pulse is 56. Her respiration is 14.   Lungs are clear. Cardiac exam regular  rate and rhythm. There are no murmurs, rubs or bruits. She has a systolic click from her mechanical aortic valve. Her breast exam shows right breast no masses edema or erythema. The right axillar adenopathy. Left breast has a well-healed lumpectomy scar at the 12:00 position. There is no left axillary adenopathy. Abdomen is soft. She has good bowel sounds. There is no palpable liver or spleen. Back exam shows no tenderness over the spine, ribs or hips. Extremities shows no clubbing cyanosis or edema. Skin exam no rashes, ecchymoses or petechia. Neurological exam is nonfocal.  Lab Results  Component Value Date   WBC 5.2 02/05/2014   HGB 14.2  02/05/2014   HCT 43.3 02/05/2014   MCV 87 02/05/2014   PLT 299 02/05/2014     Chemistry      Component Value Date/Time   NA 138 12/30/2013 0949   K 3.5 12/30/2013 0949   CL 101 12/30/2013 0949   CO2 34* 12/30/2013 0949   BUN 13 12/30/2013 0949   CREATININE 1.0 12/30/2013 0949      Component Value Date/Time   CALCIUM 9.0 12/30/2013 0949   ALKPHOS 45 12/30/2013 0949   AST 30 12/30/2013 0949   ALT 31 12/30/2013 0949   BILITOT 0.4 12/30/2013 0949         Impression and Plan: Amanda Wiggins is 68-year  old female with a history of stage I infiltrating duct carcinoma the left breast. She underwent lumpectomy  had been on Femara. She had radiation. It has been 10  years now.  We'll plan for another followup in 6 months. I do not see  that we have to do any additional studies on her right now. We see her back, we will do Zometa. Amanda Napoleon, MD 12/2/201511:09 AM

## 2014-02-11 ENCOUNTER — Telehealth: Payer: Self-pay | Admitting: Cardiovascular Disease

## 2014-02-11 ENCOUNTER — Ambulatory Visit (INDEPENDENT_AMBULATORY_CARE_PROVIDER_SITE_OTHER): Payer: Medicare Other | Admitting: Cardiovascular Disease

## 2014-02-11 DIAGNOSIS — Z954 Presence of other heart-valve replacement: Secondary | ICD-10-CM

## 2014-02-11 DIAGNOSIS — Z5181 Encounter for therapeutic drug level monitoring: Secondary | ICD-10-CM

## 2014-02-11 DIAGNOSIS — Z952 Presence of prosthetic heart valve: Secondary | ICD-10-CM

## 2014-02-11 NOTE — Telephone Encounter (Signed)
INR 2.7 on 02/05/14 in Epic, returned call to pt, see anticoagulation note in Epic.

## 2014-02-11 NOTE — Telephone Encounter (Signed)
New message        Pt went to her oncologist last week and he did an INR check / will that take the place of me coming here for this month? / pt had a good reading.

## 2014-03-17 ENCOUNTER — Ambulatory Visit (INDEPENDENT_AMBULATORY_CARE_PROVIDER_SITE_OTHER): Payer: Medicare Other

## 2014-03-17 DIAGNOSIS — Z7901 Long term (current) use of anticoagulants: Secondary | ICD-10-CM

## 2014-03-17 DIAGNOSIS — Z952 Presence of prosthetic heart valve: Secondary | ICD-10-CM

## 2014-03-17 DIAGNOSIS — Z954 Presence of other heart-valve replacement: Secondary | ICD-10-CM

## 2014-03-17 DIAGNOSIS — Z5181 Encounter for therapeutic drug level monitoring: Secondary | ICD-10-CM

## 2014-03-17 DIAGNOSIS — I359 Nonrheumatic aortic valve disorder, unspecified: Secondary | ICD-10-CM

## 2014-03-17 LAB — POCT INR: INR: 6.7

## 2014-03-17 LAB — PROTIME-INR
INR: 7.4 ratio — AB (ref 0.8–1.0)
PROTHROMBIN TIME: 77.9 s — AB (ref 9.6–13.1)

## 2014-03-19 ENCOUNTER — Ambulatory Visit (INDEPENDENT_AMBULATORY_CARE_PROVIDER_SITE_OTHER): Payer: Medicare Other | Admitting: *Deleted

## 2014-03-19 DIAGNOSIS — Z952 Presence of prosthetic heart valve: Secondary | ICD-10-CM

## 2014-03-19 DIAGNOSIS — Z5181 Encounter for therapeutic drug level monitoring: Secondary | ICD-10-CM

## 2014-03-19 DIAGNOSIS — Z954 Presence of other heart-valve replacement: Secondary | ICD-10-CM

## 2014-03-19 DIAGNOSIS — Z7901 Long term (current) use of anticoagulants: Secondary | ICD-10-CM

## 2014-03-19 DIAGNOSIS — I359 Nonrheumatic aortic valve disorder, unspecified: Secondary | ICD-10-CM

## 2014-03-19 LAB — POCT INR: INR: 2.6

## 2014-03-26 ENCOUNTER — Ambulatory Visit (INDEPENDENT_AMBULATORY_CARE_PROVIDER_SITE_OTHER): Payer: Medicare Other | Admitting: *Deleted

## 2014-03-26 DIAGNOSIS — Z7901 Long term (current) use of anticoagulants: Secondary | ICD-10-CM

## 2014-03-26 DIAGNOSIS — I359 Nonrheumatic aortic valve disorder, unspecified: Secondary | ICD-10-CM

## 2014-03-26 DIAGNOSIS — Z954 Presence of other heart-valve replacement: Secondary | ICD-10-CM

## 2014-03-26 DIAGNOSIS — Z5181 Encounter for therapeutic drug level monitoring: Secondary | ICD-10-CM

## 2014-03-26 DIAGNOSIS — Z952 Presence of prosthetic heart valve: Secondary | ICD-10-CM

## 2014-03-26 LAB — POCT INR: INR: 2

## 2014-04-07 ENCOUNTER — Other Ambulatory Visit: Payer: Self-pay | Admitting: Internal Medicine

## 2014-04-07 ENCOUNTER — Other Ambulatory Visit: Payer: Self-pay

## 2014-04-07 DIAGNOSIS — Z1231 Encounter for screening mammogram for malignant neoplasm of breast: Secondary | ICD-10-CM

## 2014-04-10 ENCOUNTER — Ambulatory Visit (INDEPENDENT_AMBULATORY_CARE_PROVIDER_SITE_OTHER): Payer: Medicare Other | Admitting: *Deleted

## 2014-04-10 DIAGNOSIS — Z954 Presence of other heart-valve replacement: Secondary | ICD-10-CM

## 2014-04-10 DIAGNOSIS — I359 Nonrheumatic aortic valve disorder, unspecified: Secondary | ICD-10-CM

## 2014-04-10 DIAGNOSIS — Z952 Presence of prosthetic heart valve: Secondary | ICD-10-CM

## 2014-04-10 DIAGNOSIS — Z7901 Long term (current) use of anticoagulants: Secondary | ICD-10-CM

## 2014-04-10 DIAGNOSIS — Z5181 Encounter for therapeutic drug level monitoring: Secondary | ICD-10-CM

## 2014-04-10 LAB — POCT INR: INR: 2.4

## 2014-05-08 ENCOUNTER — Ambulatory Visit (INDEPENDENT_AMBULATORY_CARE_PROVIDER_SITE_OTHER): Payer: Medicare Other | Admitting: *Deleted

## 2014-05-08 DIAGNOSIS — Z952 Presence of prosthetic heart valve: Secondary | ICD-10-CM

## 2014-05-08 DIAGNOSIS — Z5181 Encounter for therapeutic drug level monitoring: Secondary | ICD-10-CM

## 2014-05-08 DIAGNOSIS — Z7901 Long term (current) use of anticoagulants: Secondary | ICD-10-CM

## 2014-05-08 DIAGNOSIS — Z954 Presence of other heart-valve replacement: Secondary | ICD-10-CM

## 2014-05-08 DIAGNOSIS — I359 Nonrheumatic aortic valve disorder, unspecified: Secondary | ICD-10-CM

## 2014-05-08 LAB — POCT INR: INR: 2.2

## 2014-05-12 ENCOUNTER — Ambulatory Visit
Admission: RE | Admit: 2014-05-12 | Discharge: 2014-05-12 | Disposition: A | Discharge status: Home / Self Care | Payer: Medicare Other | Source: Ambulatory Visit

## 2014-05-12 DIAGNOSIS — Z1231 Encounter for screening mammogram for malignant neoplasm of breast: Secondary | ICD-10-CM

## 2014-06-04 ENCOUNTER — Other Ambulatory Visit: Payer: Self-pay | Admitting: Internal Medicine

## 2014-06-07 ENCOUNTER — Other Ambulatory Visit: Payer: Self-pay | Admitting: Internal Medicine

## 2014-06-19 ENCOUNTER — Ambulatory Visit (INDEPENDENT_AMBULATORY_CARE_PROVIDER_SITE_OTHER): Payer: Medicare Other | Admitting: Surgery

## 2014-06-19 DIAGNOSIS — Z5181 Encounter for therapeutic drug level monitoring: Secondary | ICD-10-CM

## 2014-06-19 DIAGNOSIS — I359 Nonrheumatic aortic valve disorder, unspecified: Secondary | ICD-10-CM

## 2014-06-19 DIAGNOSIS — Z7901 Long term (current) use of anticoagulants: Secondary | ICD-10-CM

## 2014-06-19 DIAGNOSIS — Z954 Presence of other heart-valve replacement: Secondary | ICD-10-CM

## 2014-06-19 DIAGNOSIS — Z952 Presence of prosthetic heart valve: Secondary | ICD-10-CM

## 2014-06-19 LAB — POCT INR: INR: 2.4

## 2014-06-21 ENCOUNTER — Other Ambulatory Visit: Payer: Self-pay | Admitting: Internal Medicine

## 2014-06-23 ENCOUNTER — Other Ambulatory Visit (INDEPENDENT_AMBULATORY_CARE_PROVIDER_SITE_OTHER): Payer: Medicare Other

## 2014-06-23 ENCOUNTER — Encounter: Payer: 59 | Admitting: Internal Medicine

## 2014-06-23 DIAGNOSIS — I10 Essential (primary) hypertension: Secondary | ICD-10-CM | POA: Diagnosis not present

## 2014-06-23 DIAGNOSIS — Z0000 Encounter for general adult medical examination without abnormal findings: Secondary | ICD-10-CM | POA: Diagnosis not present

## 2014-06-23 DIAGNOSIS — E785 Hyperlipidemia, unspecified: Secondary | ICD-10-CM

## 2014-06-23 LAB — POCT URINALYSIS DIPSTICK
Bilirubin, UA: NEGATIVE
Blood, UA: NEGATIVE
Glucose, UA: NEGATIVE
KETONES UA: NEGATIVE
Nitrite, UA: NEGATIVE
Protein, UA: NEGATIVE
Spec Grav, UA: 1.02
UROBILINOGEN UA: 0.2
pH, UA: 7.5

## 2014-06-23 LAB — HEPATIC FUNCTION PANEL
ALK PHOS: 51 U/L (ref 39–117)
ALT: 28 U/L (ref 0–35)
AST: 29 U/L (ref 0–37)
Albumin: 4.4 g/dL (ref 3.5–5.2)
BILIRUBIN TOTAL: 0.4 mg/dL (ref 0.2–1.2)
Bilirubin, Direct: 0.1 mg/dL (ref 0.0–0.3)
Total Protein: 7.2 g/dL (ref 6.0–8.3)

## 2014-06-23 LAB — BASIC METABOLIC PANEL
BUN: 20 mg/dL (ref 6–23)
CALCIUM: 10.2 mg/dL (ref 8.4–10.5)
CO2: 30 mEq/L (ref 19–32)
Chloride: 104 mEq/L (ref 96–112)
Creatinine, Ser: 0.92 mg/dL (ref 0.40–1.20)
GFR: 64.64 mL/min (ref >60.00)
GLUCOSE: 97 mg/dL (ref 70–99)
Potassium: 4.9 mEq/L (ref 3.5–5.1)
Sodium: 142 mEq/L (ref 135–145)

## 2014-06-23 LAB — LIPID PANEL
CHOLESTEROL: 172 mg/dL (ref 0–200)
HDL: 55.8 mg/dL (ref >39.00)
LDL Cholesterol: 79 mg/dL (ref 0–99)
NonHDL: 116.2
Total CHOL/HDL Ratio: 3
Triglycerides: 184 mg/dL — ABNORMAL HIGH (ref 0.0–149.0)
VLDL: 36.8 mg/dL (ref 0.0–40.0)

## 2014-06-23 LAB — CBC WITH DIFFERENTIAL/PLATELET
Basophils Absolute: 0 10*3/uL (ref 0.0–0.1)
Basophils Relative: 0.4 % (ref 0.0–3.0)
EOS PCT: 1.7 % (ref 0.0–5.0)
Eosinophils Absolute: 0.1 10*3/uL (ref 0.0–0.7)
HEMATOCRIT: 43.8 % (ref 36.0–46.0)
Hemoglobin: 14.5 g/dL (ref 12.0–15.0)
LYMPHS ABS: 1.4 10*3/uL (ref 0.7–4.0)
Lymphocytes Relative: 23.7 % (ref 12.0–46.0)
MCHC: 33.1 g/dL (ref 30.0–36.0)
MCV: 83.5 fl (ref 78.0–100.0)
MONOS PCT: 6.4 % (ref 3.0–12.0)
Monocytes Absolute: 0.4 10*3/uL (ref 0.1–1.0)
NEUTROS PCT: 67.8 % (ref 43.0–77.0)
Neutro Abs: 4 10*3/uL (ref 1.4–7.7)
PLATELETS: 323 10*3/uL (ref 150.0–400.0)
RBC: 5.25 Mil/uL — AB (ref 3.87–5.11)
RDW: 13.5 % (ref 11.5–15.5)
WBC: 5.8 10*3/uL (ref 4.0–10.5)

## 2014-06-23 LAB — TSH: TSH: 3.47 u[IU]/mL (ref 0.35–4.50)

## 2014-07-07 ENCOUNTER — Ambulatory Visit (INDEPENDENT_AMBULATORY_CARE_PROVIDER_SITE_OTHER): Payer: Medicare Other | Admitting: Internal Medicine

## 2014-07-07 ENCOUNTER — Encounter: Payer: Self-pay | Admitting: Internal Medicine

## 2014-07-07 VITALS — BP 138/80 | HR 76 | Temp 98.4°F | Resp 20 | Ht 61.0 in | Wt 133.0 lb

## 2014-07-07 DIAGNOSIS — E785 Hyperlipidemia, unspecified: Secondary | ICD-10-CM

## 2014-07-07 DIAGNOSIS — G4701 Insomnia due to medical condition: Secondary | ICD-10-CM

## 2014-07-07 DIAGNOSIS — Z23 Encounter for immunization: Secondary | ICD-10-CM

## 2014-07-07 DIAGNOSIS — Z0000 Encounter for general adult medical examination without abnormal findings: Secondary | ICD-10-CM

## 2014-07-07 DIAGNOSIS — Z7901 Long term (current) use of anticoagulants: Secondary | ICD-10-CM

## 2014-07-07 DIAGNOSIS — I10 Essential (primary) hypertension: Secondary | ICD-10-CM | POA: Diagnosis not present

## 2014-07-07 DIAGNOSIS — M818 Other osteoporosis without current pathological fracture: Secondary | ICD-10-CM

## 2014-07-07 DIAGNOSIS — Z952 Presence of prosthetic heart valve: Secondary | ICD-10-CM

## 2014-07-07 DIAGNOSIS — M899 Disorder of bone, unspecified: Secondary | ICD-10-CM | POA: Diagnosis not present

## 2014-07-07 DIAGNOSIS — T386X5A Adverse effect of antigonadotrophins, antiestrogens, antiandrogens, not elsewhere classified, initial encounter: Secondary | ICD-10-CM

## 2014-07-07 DIAGNOSIS — Z954 Presence of other heart-valve replacement: Secondary | ICD-10-CM

## 2014-07-07 DIAGNOSIS — C549 Malignant neoplasm of corpus uteri, unspecified: Secondary | ICD-10-CM

## 2014-07-07 DIAGNOSIS — M949 Disorder of cartilage, unspecified: Secondary | ICD-10-CM

## 2014-07-07 MED ORDER — TEMAZEPAM 30 MG PO CAPS: 30.0000 mg | ORAL_CAPSULE | Freq: Every evening | ORAL | Status: DC | PRN

## 2014-07-07 NOTE — Progress Notes (Signed)
Pre visit review using our clinic review tool, if applicable. No additional management support is needed unless otherwise documented below in the visit note. 

## 2014-07-07 NOTE — Patient Instructions (Addendum)
Limit your sodium (Salt) intake    It is important that you exercise regularly, at least 20 minutes 3 to 4 times per week.  If you develop chest pain or shortness of breath seek  medical attention.  Return in one year for follow-up  Follow-up cardiology and oncology  Health Maintenance Adopting a healthy lifestyle and getting preventive care can go a long way to promote health and wellness. Talk with your health care provider about what schedule of regular examinations is right for you. This is a good chance for you to check in with your provider about disease prevention and staying healthy. In between checkups, there are plenty of things you can do on your own. Experts have done a lot of research about which lifestyle changes and preventive measures are most likely to keep you healthy. Ask your health care provider for more information. WEIGHT AND DIET  Eat a healthy diet  Be sure to include plenty of vegetables, fruits, low-fat dairy products, and lean protein.  Do not eat a lot of foods high in solid fats, added sugars, or salt.  Get regular exercise. This is one of the most important things you can do for your health.  Most adults should exercise for at least 150 minutes each week. The exercise should increase your heart rate and make you sweat (moderate-intensity exercise).  Most adults should also do strengthening exercises at least twice a week. This is in addition to the moderate-intensity exercise.  Maintain a healthy weight  Body mass index (BMI) is a measurement that can be used to identify possible weight problems. It estimates body fat based on height and weight. Your health care provider can help determine your BMI and help you achieve or maintain a healthy weight.  For females 29 years of age and older:   A BMI below 18.5 is considered underweight.  A BMI of 18.5 to 24.9 is normal.  A BMI of 25 to 29.9 is considered overweight.  A BMI of 30 and above is considered  obese.  Watch levels of cholesterol and blood lipids  You should start having your blood tested for lipids and cholesterol at 68 years of age, then have this test every 5 years.  You may need to have your cholesterol levels checked more often if:  Your lipid or cholesterol levels are high.  You are older than 68 years of age.  You are at high risk for heart disease.  CANCER SCREENING   Lung Cancer  Lung cancer screening is recommended for adults 40-25 years old who are at high risk for lung cancer because of a history of smoking.  A yearly low-dose CT scan of the lungs is recommended for people who:  Currently smoke.  Have quit within the past 15 years.  Have at least a 30-pack-year history of smoking. A pack year is smoking an average of one pack of cigarettes a day for 1 year.  Yearly screening should continue until it has been 15 years since you quit.  Yearly screening should stop if you develop a health problem that would prevent you from having lung cancer treatment.  Breast Cancer  Practice breast self-awareness. This means understanding how your breasts normally appear and feel.  It also means doing regular breast self-exams. Let your health care provider know about any changes, no matter how small.  If you are in your 20s or 30s, you should have a clinical breast exam (CBE) by a health care provider every 1-3  years as part of a regular health exam.  If you are 32 or older, have a CBE every year. Also consider having a breast X-ray (mammogram) every year.  If you have a family history of breast cancer, talk to your health care provider about genetic screening.  If you are at high risk for breast cancer, talk to your health care provider about having an MRI and a mammogram every year.  Breast cancer gene (BRCA) assessment is recommended for women who have family members with BRCA-related cancers. BRCA-related cancers  include:  Breast.  Ovarian.  Tubal.  Peritoneal cancers.  Results of the assessment will determine the need for genetic counseling and BRCA1 and BRCA2 testing. Cervical Cancer Routine pelvic examinations to screen for cervical cancer are no longer recommended for nonpregnant women who are considered low risk for cancer of the pelvic organs (ovaries, uterus, and vagina) and who do not have symptoms. A pelvic examination may be necessary if you have symptoms including those associated with pelvic infections. Ask your health care provider if a screening pelvic exam is right for you.   The Pap test is the screening test for cervical cancer for women who are considered at risk.  If you had a hysterectomy for a problem that was not cancer or a condition that could lead to cancer, then you no longer need Pap tests.  If you are older than 65 years, and you have had normal Pap tests for the past 10 years, you no longer need to have Pap tests.  If you have had past treatment for cervical cancer or a condition that could lead to cancer, you need Pap tests and screening for cancer for at least 20 years after your treatment.  If you no longer get a Pap test, assess your risk factors if they change (such as having a new sexual partner). This can affect whether you should start being screened again.  Some women have medical problems that increase their chance of getting cervical cancer. If this is the case for you, your health care provider may recommend more frequent screening and Pap tests.  The human papillomavirus (HPV) test is another test that may be used for cervical cancer screening. The HPV test looks for the virus that can cause cell changes in the cervix. The cells collected during the Pap test can be tested for HPV.  The HPV test can be used to screen women 34 years of age and older. Getting tested for HPV can extend the interval between normal Pap tests from three to five years.  An HPV  test also should be used to screen women of any age who have unclear Pap test results.  After 68 years of age, women should have HPV testing as often as Pap tests.  Colorectal Cancer  This type of cancer can be detected and often prevented.  Routine colorectal cancer screening usually begins at 68 years of age and continues through 68 years of age.  Your health care provider may recommend screening at an earlier age if you have risk factors for colon cancer.  Your health care provider may also recommend using home test kits to check for hidden blood in the stool.  A small camera at the end of a tube can be used to examine your colon directly (sigmoidoscopy or colonoscopy). This is done to check for the earliest forms of colorectal cancer.  Routine screening usually begins at age 58.  Direct examination of the colon should be repeated  every 5-10 years through 68 years of age. However, you may need to be screened more often if early forms of precancerous polyps or small growths are found. Skin Cancer  Check your skin from head to toe regularly.  Tell your health care provider about any new moles or changes in moles, especially if there is a change in a mole's shape or color.  Also tell your health care provider if you have a mole that is larger than the size of a pencil eraser.  Always use sunscreen. Apply sunscreen liberally and repeatedly throughout the day.  Protect yourself by wearing long sleeves, pants, a wide-brimmed hat, and sunglasses whenever you are outside. HEART DISEASE, DIABETES, AND HIGH BLOOD PRESSURE   Have your blood pressure checked at least every 1-2 years. High blood pressure causes heart disease and increases the risk of stroke.  If you are between 70 years and 63 years old, ask your health care provider if you should take aspirin to prevent strokes.  Have regular diabetes screenings. This involves taking a blood sample to check your fasting blood sugar  level.  If you are at a normal weight and have a low risk for diabetes, have this test once every three years after 68 years of age.  If you are overweight and have a high risk for diabetes, consider being tested at a younger age or more often. PREVENTING INFECTION  Hepatitis B  If you have a higher risk for hepatitis B, you should be screened for this virus. You are considered at high risk for hepatitis B if:  You were born in a country where hepatitis B is common. Ask your health care provider which countries are considered high risk.  Your parents were born in a high-risk country, and you have not been immunized against hepatitis B (hepatitis B vaccine).  You have HIV or AIDS.  You use needles to inject street drugs.  You live with someone who has hepatitis B.  You have had sex with someone who has hepatitis B.  You get hemodialysis treatment.  You take certain medicines for conditions, including cancer, organ transplantation, and autoimmune conditions. Hepatitis C  Blood testing is recommended for:  Everyone born from [birth year] through 1964.  Anyone with known risk factors for hepatitis C. Sexually transmitted infections (STIs)  You should be screened for sexually transmitted infections (STIs) including gonorrhea and chlamydia if:  You are sexually active and are younger than 68 years of age.  You are older than 68 years of age and your health care provider tells you that you are at risk for this type of infection.  Your sexual activity has changed since you were last screened and you are at an increased risk for chlamydia or gonorrhea. Ask your health care provider if you are at risk.  If you do not have HIV, but are at risk, it may be recommended that you take a prescription medicine daily to prevent HIV infection. This is called pre-exposure prophylaxis (PrEP). You are considered at risk if:  You are sexually active and do not regularly use condoms or know the HIV status  of your partner(s).  You take drugs by injection.  You are sexually active with a partner who has HIV. Talk with your health care provider about whether you are at high risk of being infected with HIV. If you choose to begin PrEP, you should first be tested for HIV. You should then be tested every 3 months for as long as  you are taking PrEP.  PREGNANCY   If you are premenopausal and you may become pregnant, ask your health care provider about preconception counseling.  If you may become pregnant, take 400 to 800 micrograms (mcg) of folic acid every day.  If you want to prevent pregnancy, talk to your health care provider about birth control (contraception). OSTEOPOROSIS AND MENOPAUSE   Osteoporosis is a disease in which the bones lose minerals and strength with aging. This can result in serious bone fractures. Your risk for osteoporosis can be identified using a bone density scan.  If you are 65 years of age or older, or if you are at risk for osteoporosis and fractures, ask your health care provider if you should be screened.  Ask your health care provider whether you should take a calcium or vitamin D supplement to lower your risk for osteoporosis.  Menopause may have certain physical symptoms and risks.  Hormone replacement therapy may reduce some of these symptoms and risks. Talk to your health care provider about whether hormone replacement therapy is right for you.  HOME CARE INSTRUCTIONS   Schedule regular health, dental, and eye exams.  Stay current with your immunizations.   Do not use any tobacco products including cigarettes, chewing tobacco, or electronic cigarettes.  If you are pregnant, do not drink alcohol.  If you are breastfeeding, limit how much and how often you drink alcohol.  Limit alcohol intake to no more than 1 drink per day for nonpregnant women. One drink equals 12 ounces of beer, 5 ounces of wine, or 1 ounces of hard liquor.  Do not use street  drugs.  Do not share needles.  Ask your health care provider for help if you need support or information about quitting drugs.  Tell your health care provider if you often feel depressed.  Tell your health care provider if you have ever been abused or do not feel safe at home. Document Released: 09/06/2010 Document Revised: 07/08/2013 Document Reviewed: 01/23/2013 [employer] Patient Information 2015 [employer]. This information is not intended to replace advice given to you by your health care provider. Make sure you discuss any questions you have with your health care provider.  

## 2014-07-07 NOTE — Progress Notes (Signed)
Patient ID: Amanda Wiggins, female   DOB: [birth year], 69 y.o.   MRN: [id number]  Subjective:    Patient ID: Amanda Wiggins, female    DOB: [birth year], 69 y.o.   MRN: [id number]  HPI  69 year old patient seen today for a annual preventive health exam.   Patient has had screen lab and has seen cardiology in July and over the past year.  She remains quite stable.    Review of Systems  Constitutional: Negative for fever, appetite change, fatigue and unexpected weight change.  HENT: Negative for congestion, dental problem, ear pain, hearing loss, mouth sores, nosebleeds, sinus pressure, sore throat, tinnitus, trouble swallowing and voice change.   Eyes: Negative for photophobia, pain, redness and visual disturbance.  Respiratory: Negative for cough, chest tightness and shortness of breath.   Cardiovascular: Negative for chest pain, palpitations and leg swelling.  Gastrointestinal: Negative for nausea, vomiting, abdominal pain, diarrhea, constipation, blood in stool, abdominal distention and rectal pain.  Genitourinary: Negative for dysuria, urgency, frequency, hematuria, flank pain, vaginal bleeding, vaginal discharge, difficulty urinating, genital sores, vaginal pain, menstrual problem and pelvic pain.  Musculoskeletal: Positive for arthralgias. Negative for back pain and neck stiffness.  Skin: Negative for rash.  Neurological: Negative for dizziness, syncope, speech difficulty, weakness, light-headedness, numbness and headaches.  Hematological: Negative for adenopathy. Does not bruise/bleed easily.  Psychiatric/Behavioral: Negative for suicidal ideas, behavioral problems, self-injury, dysphoric mood and agitation. The patient is not nervous/anxious.        Objective:   Physical Exam  Constitutional: She is oriented to person, place, and time. She appears well-developed and well-nourished.  HENT:  Head: Normocephalic and atraumatic.  Right Ear: External ear normal.  Left Ear: External  ear normal.  Mouth/Throat: Oropharynx is clear and moist.  Eyes: Conjunctivae and EOM are normal.  Neck: Normal range of motion. Neck supple. No JVD present. No thyromegaly present.  Cardiovascular: Normal rate, regular rhythm, normal heart sounds and intact distal pulses.   No murmur heard. Prosthetic heart sounds  Pulmonary/Chest: Effort normal and breath sounds normal. She has no wheezes. She has no rales.  Abdominal: Soft. Bowel sounds are normal. She exhibits no distension and no mass. There is no tenderness. There is no rebound and no guarding.  Musculoskeletal: Normal range of motion. She exhibits no edema or tenderness.  Neurological: She is alert and oriented to person, place, and time. She has normal reflexes. No cranial nerve deficit. She exhibits normal muscle tone. Coordination normal.  Skin: Skin is warm and dry. No rash noted.  Psychiatric: She has a normal mood and affect. Her behavior is normal.          Assessment & Plan:   Subjective:    Patient ID: Amanda Wiggins, female    DOB: 1945/10/13, 68 y.o.   MRN: [id number]  HPI  69 year-old patient who is seen today for a health maintenance examination.  She is followed by multiple consultants including cardiology oncology OB/GYN general surgery and ENT. She is doing reasonably well. She is now  Approximately 10  years out from aortic valve replacement surgery. Since her last visit here osteopenia has progressed to osteoporosis secondary to aromatase inhibition. She is now receiving annual reclast injections   Allergies:  1) ! Demerol  2) Sulfamethoxazole (Sulfamethoxazole)   Past History:  Past Medical History:  Hyperlipidemia  Hypertension  Breast cancer, hx of  GERD  Osteoporosis status post aortic valve repair  migraine headaches  menopausal syndrome   Past Surgical  History:  Lumpectomy breast cancer 2005  Aortic Valve Replacement: 2003  Hysterectomy,endometrial cancer 2001  Right shoulder surgery   Nissan fundoplication 8841, post-op hematoma on heparin  colonoscopy 11-08   Family History:   father status post aortic valve surgery  mother status post MI, age 42  Both parents are deceased, but lived into their nineties One brother, history of hypercholesterolemia, osteoarthritis  one sister  No FH of Colon Cancer   Social History:   married,  Patient has never smoked.  Alcohol Use - socially  Occupation: Retired  Illicit Drug Use - no  Patient gets regular exercise.   Past Medical History  Diagnosis Date  . Hyperlipidemia   . Hypertension   . Hx of breast cancer   . GERD (gastroesophageal reflux disease)   . Osteopenia   . Status post aortic valve repair   . FHx: migraine headaches   . Menopausal syndrome    Past Surgical History  Procedure Date  . Lummpectomy breast cancer 2005  . Aortic valve replacement 2003  . Hysterectomy, endometiral cancer 2001  . Right shouler surgery   . Nissan fundoplicaiton 6606    post-op hematoma on heparin     reports that she has never smoked. She has never used smokeless tobacco. She reports that she drinks alcohol. She reports that she does not use illicit drugs. family history includes Heart attack (age of onset:59) in her mother and Osteoarthritis in her brother. Allergies  Allergen Reactions  . Meperidine Hcl   . Sulfamethoxazole     REACTION: unspecified   1. Risk factors, based on past  M,S,F history- cardiovascular risk factors include hypertension and dyslipidemia. She is status post aortic valve repair  2.  Physical activities: No exercise limitations  3.  Depression/mood: No history of depression or mood disorder  4.  Hearing: No deficits  5.  ADL's: Independent in all aspects of daily living  6.  Fall risk: Low  7.  Home safety: No proms identifying  8.  Height weight, and visual acuity; height and weight stable no change in visual acuity  9.  Counseling: Regular exercise program encouraged  10. Lab  orders based on risk factors: Will check a lipid profile and TSH  11. Referral : Followup GYN cardiology oncology  12. Care plan: Continue present regimen. Follow multiple consultants  13. Cognitive assessment: Alert and oriented with normal affect. No cognitive dysfunction  14.  Preventive services will include annual clinical exams with primary care medicine.  She is also followed by oncology and cardiology at least annually.  Serial bone density studies will be obtained due to her osteopenia  15.  Provider list includes OB/GYN cardiology, primary care GI and oncology     Assessment & Plan:    Preventive health examination.  Follow cardiology Status post aortic valve repair Osteoporosis.  Continue Reclast Hypertension, stable.  Continue present regimen  Dyslipidemia.  Continue statin therapy Remote breast and endometrial cancer  Recheck 1 year Follow multiple consultants

## 2014-07-07 NOTE — Progress Notes (Signed)
   Subjective:    Patient ID: Amanda Wiggins, female    DOB: 1945-07-16, 69 y.o.   MRN: [id number]  HPI    Review of Systems     Objective:   Physical Exam        Assessment & Plan:

## 2014-07-17 ENCOUNTER — Other Ambulatory Visit: Payer: Self-pay | Admitting: Internal Medicine

## 2014-07-19 ENCOUNTER — Other Ambulatory Visit: Payer: Self-pay | Admitting: Internal Medicine

## 2014-07-30 ENCOUNTER — Ambulatory Visit (INDEPENDENT_AMBULATORY_CARE_PROVIDER_SITE_OTHER): Payer: Medicare Other

## 2014-07-30 DIAGNOSIS — I359 Nonrheumatic aortic valve disorder, unspecified: Secondary | ICD-10-CM

## 2014-07-30 DIAGNOSIS — Z7901 Long term (current) use of anticoagulants: Secondary | ICD-10-CM

## 2014-07-30 DIAGNOSIS — Z5181 Encounter for therapeutic drug level monitoring: Secondary | ICD-10-CM

## 2014-07-30 DIAGNOSIS — Z954 Presence of other heart-valve replacement: Secondary | ICD-10-CM | POA: Diagnosis not present

## 2014-07-30 DIAGNOSIS — Z952 Presence of prosthetic heart valve: Secondary | ICD-10-CM

## 2014-07-30 LAB — POCT INR: INR: 2.2

## 2014-08-06 ENCOUNTER — Ambulatory Visit: Payer: Medicare Other | Admitting: Hematology & Oncology

## 2014-08-06 ENCOUNTER — Other Ambulatory Visit: Payer: Medicare Other

## 2014-08-06 ENCOUNTER — Ambulatory Visit (HOSPITAL_BASED_OUTPATIENT_CLINIC_OR_DEPARTMENT_OTHER): Payer: Medicare Other | Admitting: Hematology & Oncology

## 2014-08-06 ENCOUNTER — Other Ambulatory Visit (HOSPITAL_BASED_OUTPATIENT_CLINIC_OR_DEPARTMENT_OTHER): Payer: Medicare Other

## 2014-08-06 ENCOUNTER — Ambulatory Visit (HOSPITAL_BASED_OUTPATIENT_CLINIC_OR_DEPARTMENT_OTHER): Payer: Medicare Other

## 2014-08-06 ENCOUNTER — Encounter: Payer: Self-pay | Admitting: Hematology & Oncology

## 2014-08-06 ENCOUNTER — Ambulatory Visit: Payer: Medicare Other

## 2014-08-06 VITALS — BP 132/68 | HR 69 | Temp 97.8°F | Resp 18 | Ht 61.0 in | Wt 130.0 lb

## 2014-08-06 DIAGNOSIS — Z853 Personal history of malignant neoplasm of breast: Secondary | ICD-10-CM

## 2014-08-06 DIAGNOSIS — T386X5A Adverse effect of antigonadotrophins, antiestrogens, antiandrogens, not elsewhere classified, initial encounter: Principal | ICD-10-CM

## 2014-08-06 DIAGNOSIS — M810 Age-related osteoporosis without current pathological fracture: Secondary | ICD-10-CM | POA: Diagnosis not present

## 2014-08-06 DIAGNOSIS — M818 Other osteoporosis without current pathological fracture: Secondary | ICD-10-CM

## 2014-08-06 DIAGNOSIS — G4701 Insomnia due to medical condition: Secondary | ICD-10-CM

## 2014-08-06 DIAGNOSIS — C549 Malignant neoplasm of corpus uteri, unspecified: Secondary | ICD-10-CM

## 2014-08-06 LAB — CBC WITH DIFFERENTIAL (CANCER CENTER ONLY)
BASO#: 0 10*3/uL (ref 0.0–0.2)
BASO%: 0.1 % (ref 0.0–2.0)
EOS%: 0.8 % (ref 0.0–7.0)
Eosinophils Absolute: 0.1 10*3/uL (ref 0.0–0.5)
HEMATOCRIT: 43 % (ref 34.8–46.6)
HEMOGLOBIN: 14.3 g/dL (ref 11.6–15.9)
LYMPH#: 1.4 10*3/uL (ref 0.9–3.3)
LYMPH%: 20 % (ref 14.0–48.0)
MCH: 28.6 pg (ref 26.0–34.0)
MCHC: 33.3 g/dL (ref 32.0–36.0)
MCV: 86 fL (ref 81–101)
MONO#: 0.5 10*3/uL (ref 0.1–0.9)
MONO%: 6.8 % (ref 0.0–13.0)
NEUT%: 72.3 % (ref 39.6–80.0)
NEUTROS ABS: 5.2 10*3/uL (ref 1.5–6.5)
PLATELETS: 317 10*3/uL (ref 145–400)
RBC: 5 10*6/uL (ref 3.70–5.32)
RDW: 13.3 % (ref 11.1–15.7)
WBC: 7.2 10*3/uL (ref 3.9–10.0)

## 2014-08-06 LAB — LACTATE DEHYDROGENASE: LDH: 205 U/L (ref 94–250)

## 2014-08-06 LAB — CMP (CANCER CENTER ONLY)
ALK PHOS: 54 U/L (ref 26–84)
ALT(SGPT): 31 U/L (ref 10–47)
AST: 34 U/L (ref 11–38)
Albumin: 3.8 g/dL (ref 3.3–5.5)
BUN: 17 mg/dL (ref 7–22)
CALCIUM: 9 mg/dL (ref 8.0–10.3)
CHLORIDE: 99 meq/L (ref 98–108)
CO2: 30 mEq/L (ref 18–33)
Creat: 1.1 mg/dl (ref 0.6–1.2)
Glucose, Bld: 112 mg/dL (ref 73–118)
Potassium: 3.3 mEq/L (ref 3.3–4.7)
SODIUM: 136 meq/L (ref 128–145)
TOTAL PROTEIN: 7 g/dL (ref 6.4–8.1)
Total Bilirubin: 0.7 mg/dl (ref 0.20–1.60)

## 2014-08-06 MED ORDER — ZOLEDRONIC ACID 4 MG/100ML IV SOLN
5.0000 mg | Freq: Once | INTRAVENOUS | Status: AC
  Administered 2014-08-06: 5 mg via INTRAVENOUS
  Filled 2014-08-06: qty 125

## 2014-08-06 NOTE — Progress Notes (Signed)
Hematology and Oncology Follow Up Visit  Amanda Wiggins [id number] Dec 25, 1945 69 y.o. 08/06/2014   Principle Diagnosis:  1. Stage I (T1 N0 M0) ductal carcinoma of the left breast. 2. Mechanical aortic valve.  Current Therapy:    Coumadin-lifelong  Zometa 5 mg IV q. year     Interim History:  [name] is is back for followup. She is doing okay . She was last seen back in December.  She is on Coumadin for a mechanical heart valve. She has had some issues with the INR. She's being followed cardiology for this. Per she's had no problems with bowels or bladder.  She does state that she does forget to take her potassium every now and then.  She's had no leg swelling.  She's had no fever. She's had no nausea or vomiting. She's had no headache.    Medications:  Current outpatient prescriptions:  .  BIOTIN PO, Take by mouth every morning. , Disp: , Rfl:  .  Calcium Carbonate-Vitamin D (CALCIUM 600+D) 600-400 MG-UNIT per tablet, Take 1 tablet by mouth 2 (two) times daily.  , Disp: , Rfl:  .  chlorthalidone (HYGROTON) 25 MG tablet, TAKE 1 TABLET DAILY FOR FOUR DAYS PER WEEK, Disp: 90 tablet, Rfl: 1 .  CRESTOR 10 MG tablet, TAKE 1 TABLET AT BEDTIME, Disp: 90 tablet, Rfl: 0 .  cycloSPORINE (RESTASIS) 0.05 % ophthalmic emulsion, Place 1 drop into both eyes 2 (two) times daily as needed. , Disp: , Rfl:  .  Desoximetasone 0.05 % GEL, , Disp: , Rfl: 6 .  Glucosamine-Chondroit-Vit C-Mn (GLUCOSAMINE CHONDR 1500 COMPLX) CAPS, Take by mouth every morning., Disp: , Rfl:  .  loratadine (CLARITIN) 10 MG tablet, Take 10 mg by mouth daily.  , Disp: , Rfl:  .  Multiple Vitamin (MULTIVITAMIN) capsule, Take 1 capsule by mouth daily.  , Disp: , Rfl:  .  omeprazole (PRILOSEC) 20 MG capsule, Take by mouth. TAKE 1 CAPSULE THREE DAYS A WEEK, Disp: , Rfl:  .  potassium chloride SA (KLOR-CON M20) 20 MEQ tablet, Take 1 tablet (20 mEq total) by mouth daily., Disp: 90 tablet, Rfl: 3 .  senna (SENOKOT) 8.6 MG  tablet, Take 2 tablets by mouth daily.  , Disp: , Rfl:  .  SUMAtriptan (IMITREX) 25 MG tablet, TAKE 1 TABLET DAILY AS NEEDED, Disp: 4 tablet, Rfl: 5 .  temazepam (RESTORIL) 30 MG capsule, Take 1 capsule (30 mg total) by mouth at bedtime as needed., Disp: 90 capsule, Rfl: 1 .  verapamil (CALAN-SR) 180 MG CR tablet, TAKE 1 TABLET DAILY, Disp: 90 tablet, Rfl: 3 .  warfarin (COUMADIN) 5 MG tablet, Take as directed by coumadin clinic, Disp: 90 tablet, Rfl: 1  Allergies:  Allergies  Allergen Reactions  . Latex Hives  . Meperidine Hcl Nausea And Vomiting  . Sulfamethoxazole Nausea And Vomiting    REACTION: unspecified    Past Medical History, Surgical history, Social history, and Family History were reviewed and updated.  Review of Systems: As above  Physical Exam:  height is 5\' 1"  (1.549 m) and weight is 130 lb (58.968 kg). Her oral temperature is 97.8 F (36.6 C). Her blood pressure is 132/68 and her pulse is 69. Her respiration is 18.   Head and neck exam shows no ocular or oral lesions. She has no palpable cervical or supraclavicular lymph nodes. Lungs are clear. Cardiac exam regular rate and rhythm. There are no murmurs, rubs or bruits. She has a systolic click from  her mechanical aortic valve. Her breast exam shows right breast no masses edema or erythema. The right axillary adenopathy. Left breast has a well-healed lumpectomy scar at the 12:00 position. There is no left axillary adenopathy. Abdomen is soft. She has good bowel sounds. There is no palpable liver or spleen. Back exam shows no tenderness over the spine, ribs or hips. Extremities shows no clubbing cyanosis or edema. Skin exam no rashes, ecchymoses or petechia. Neurological exam is nonfocal.  Lab Results  Component Value Date   WBC 7.2 08/06/2014   HGB 14.3 08/06/2014   HCT 43.0 08/06/2014   MCV 86 08/06/2014   PLT 317 08/06/2014     Chemistry      Component Value Date/Time   NA 136 08/06/2014 0935   NA 142 06/23/2014  0840   K 3.3 08/06/2014 0935   K 4.9 06/23/2014 0840   CL 99 08/06/2014 0935   CL 104 06/23/2014 0840   CO2 30 08/06/2014 0935   CO2 30 06/23/2014 0840   BUN 17 08/06/2014 0935   BUN 20 06/23/2014 0840   CREATININE 1.1 08/06/2014 0935   CREATININE 0.92 06/23/2014 0840      Component Value Date/Time   CALCIUM 9.0 08/06/2014 0935   CALCIUM 10.2 06/23/2014 0840   ALKPHOS 54 08/06/2014 0935   ALKPHOS 51 06/23/2014 0840   AST 34 08/06/2014 0935   AST 29 06/23/2014 0840   ALT 31 08/06/2014 0935   ALT 28 06/23/2014 0840   BILITOT 0.70 08/06/2014 0935   BILITOT 0.4 06/23/2014 0840         Impression and Plan: Amanda Wiggins is 69-year  old female with a history of stage I infiltrating duct carcinoma the left breast. She underwent lumpectomy  had been on Femara. She had radiation. It has been 10  years now.  She will get her Zometa today.  We'll plan for another followup in 6 months. Volanda Napoleon, MD 6/1/201610:53 AM

## 2014-08-06 NOTE — Patient Instructions (Signed)

## 2014-09-10 ENCOUNTER — Other Ambulatory Visit: Payer: Self-pay | Admitting: Cardiovascular Disease

## 2014-09-10 ENCOUNTER — Ambulatory Visit (INDEPENDENT_AMBULATORY_CARE_PROVIDER_SITE_OTHER): Payer: Medicare Other | Admitting: *Deleted

## 2014-09-10 DIAGNOSIS — Z5181 Encounter for therapeutic drug level monitoring: Secondary | ICD-10-CM

## 2014-09-10 DIAGNOSIS — Z7901 Long term (current) use of anticoagulants: Secondary | ICD-10-CM

## 2014-09-10 DIAGNOSIS — I359 Nonrheumatic aortic valve disorder, unspecified: Secondary | ICD-10-CM

## 2014-09-10 DIAGNOSIS — Z954 Presence of other heart-valve replacement: Secondary | ICD-10-CM | POA: Diagnosis not present

## 2014-09-10 DIAGNOSIS — Z952 Presence of prosthetic heart valve: Secondary | ICD-10-CM

## 2014-09-10 LAB — POCT INR: INR: 2.4

## 2014-09-11 ENCOUNTER — Other Ambulatory Visit: Payer: Self-pay | Admitting: Surgery

## 2014-09-11 MED ORDER — WARFARIN SODIUM 5 MG PO TABS: ORAL_TABLET | ORAL | Status: DC

## 2014-10-22 ENCOUNTER — Ambulatory Visit (INDEPENDENT_AMBULATORY_CARE_PROVIDER_SITE_OTHER): Payer: Medicare Other | Admitting: *Deleted

## 2014-10-22 DIAGNOSIS — Z7901 Long term (current) use of anticoagulants: Secondary | ICD-10-CM

## 2014-10-22 DIAGNOSIS — Z5181 Encounter for therapeutic drug level monitoring: Secondary | ICD-10-CM | POA: Diagnosis not present

## 2014-10-22 DIAGNOSIS — Z952 Presence of prosthetic heart valve: Secondary | ICD-10-CM

## 2014-10-22 DIAGNOSIS — Z954 Presence of other heart-valve replacement: Secondary | ICD-10-CM

## 2014-10-22 DIAGNOSIS — I359 Nonrheumatic aortic valve disorder, unspecified: Secondary | ICD-10-CM

## 2014-10-22 LAB — POCT INR: INR: 1.9

## 2014-11-19 ENCOUNTER — Ambulatory Visit (INDEPENDENT_AMBULATORY_CARE_PROVIDER_SITE_OTHER): Payer: Medicare Other | Admitting: *Deleted

## 2014-11-19 DIAGNOSIS — Z7901 Long term (current) use of anticoagulants: Secondary | ICD-10-CM

## 2014-11-19 DIAGNOSIS — Z954 Presence of other heart-valve replacement: Secondary | ICD-10-CM | POA: Diagnosis not present

## 2014-11-19 DIAGNOSIS — I359 Nonrheumatic aortic valve disorder, unspecified: Secondary | ICD-10-CM | POA: Diagnosis not present

## 2014-11-19 DIAGNOSIS — Z952 Presence of prosthetic heart valve: Secondary | ICD-10-CM

## 2014-11-19 DIAGNOSIS — Z5181 Encounter for therapeutic drug level monitoring: Secondary | ICD-10-CM

## 2014-11-19 LAB — POCT INR: INR: 3.1

## 2014-12-16 ENCOUNTER — Other Ambulatory Visit: Payer: Self-pay | Admitting: Internal Medicine

## 2014-12-17 ENCOUNTER — Ambulatory Visit (INDEPENDENT_AMBULATORY_CARE_PROVIDER_SITE_OTHER): Payer: Medicare Other | Admitting: *Deleted

## 2014-12-17 DIAGNOSIS — Z5181 Encounter for therapeutic drug level monitoring: Secondary | ICD-10-CM

## 2014-12-17 DIAGNOSIS — Z7901 Long term (current) use of anticoagulants: Secondary | ICD-10-CM | POA: Diagnosis not present

## 2014-12-17 DIAGNOSIS — Z954 Presence of other heart-valve replacement: Secondary | ICD-10-CM

## 2014-12-17 DIAGNOSIS — I359 Nonrheumatic aortic valve disorder, unspecified: Secondary | ICD-10-CM

## 2014-12-17 DIAGNOSIS — Z952 Presence of prosthetic heart valve: Secondary | ICD-10-CM

## 2014-12-17 LAB — POCT INR: INR: 2.6

## 2014-12-21 ENCOUNTER — Other Ambulatory Visit: Payer: Self-pay | Admitting: Internal Medicine

## 2015-01-07 ENCOUNTER — Other Ambulatory Visit: Payer: Self-pay | Admitting: Internal Medicine

## 2015-01-19 ENCOUNTER — Encounter: Payer: Self-pay | Admitting: Cardiovascular Disease

## 2015-01-19 ENCOUNTER — Ambulatory Visit (INDEPENDENT_AMBULATORY_CARE_PROVIDER_SITE_OTHER): Payer: Medicare Other | Admitting: Cardiovascular Disease

## 2015-01-19 ENCOUNTER — Ambulatory Visit (INDEPENDENT_AMBULATORY_CARE_PROVIDER_SITE_OTHER): Payer: Medicare Other | Admitting: *Deleted

## 2015-01-19 VITALS — BP 130/82 | HR 68 | Ht 61.0 in | Wt 131.1 lb

## 2015-01-19 DIAGNOSIS — Z952 Presence of prosthetic heart valve: Secondary | ICD-10-CM

## 2015-01-19 DIAGNOSIS — I10 Essential (primary) hypertension: Secondary | ICD-10-CM | POA: Diagnosis not present

## 2015-01-19 DIAGNOSIS — Z954 Presence of other heart-valve replacement: Secondary | ICD-10-CM

## 2015-01-19 DIAGNOSIS — I359 Nonrheumatic aortic valve disorder, unspecified: Secondary | ICD-10-CM | POA: Diagnosis not present

## 2015-01-19 DIAGNOSIS — Z5181 Encounter for therapeutic drug level monitoring: Secondary | ICD-10-CM

## 2015-01-19 DIAGNOSIS — Z7901 Long term (current) use of anticoagulants: Secondary | ICD-10-CM

## 2015-01-19 LAB — POCT INR: INR: 2.2

## 2015-01-19 NOTE — Patient Instructions (Signed)
Medication Instructions:  Your physician recommends that you continue on your current medications as directed. Please refer to the Current Medication list given to you today.   Labwork: None Ordered   Testing/Procedures: None Ordered   Follow-Up: Your physician wants you to follow-up in: 1 year with Dr. Nahser.  You will receive a reminder letter in the mail two months in advance. If you don't receive a letter, please call our office to schedule the follow-up appointment.   If you need a refill on your cardiac medications before your next appointment, please call your pharmacy.   Thank you for choosing [facility]! Aaria Happ, RN [phone]    

## 2015-01-19 NOTE — Progress Notes (Signed)
Amanda Wiggins Date of Birth  07/13/1945 [facility] [id number] [address]    [place], [place]  [address] [phone]  Fax  [phone]  Problem List 1. Aortic valve replacement 2. Hyperlipidemia 3. Breast cancer   History of Present Illness:  Amanda Wiggins is a 69 year old female with a history of aortic stenosis-status post aortic valve replacement.  She also has a history of hypercholesterolemia.  She's not had any episodes of chest pain or shortness of breath.  She's had her INR levels checked in our Coumadin clinic. Her INR levels have all been therapeutic.  Nov. 6 , 2014  Amanda Wiggins is doing well.  i saw her a year ago.  She is 10 years our from her breast cancer and is doing well.    No cardiac complaints.   Able to do all of her normal activities.     Nov. 13, 2015:  Amanda Wiggins is doing well.   No CP .  No dyspnea.   Has been stressful.  Her mother died this past year.   Her INR levels have been  Well controlled   Nov. 14, 2016:  Doing well.  No cardiac issues.  INR levels have been stable,  A bit low the last time .  Had an AVR 13 years ago .    Current Outpatient Prescriptions on File Prior to Visit  Medication Sig Dispense Refill  . BIOTIN PO Take 1 tablet by mouth every morning.     . Calcium Carbonate-Vitamin D (CALCIUM 600+D) 600-400 MG-UNIT per tablet Take 1 tablet by mouth 2 (two) times daily.      . chlorthalidone (HYGROTON) 25 MG tablet TAKE 1 TABLET DAILY FOR FOUR DAYS PER WEEK 90 tablet 0  . CRESTOR 10 MG tablet TAKE 1 TABLET AT BEDTIME 90 tablet 0  . cycloSPORINE (RESTASIS) 0.05 % ophthalmic emulsion Place 1 drop into both eyes 2 (two) times daily as needed.     . Desoximetasone 0.05 % GEL Apply topically daily as needed (AS NEEDED FOR AFFECTED AREAS).   6  . Glucosamine-Chondroit-Vit C-Mn (GLUCOSAMINE CHONDR 1500 COMPLX) CAPS Take by mouth every morning.    . loratadine (CLARITIN) 10 MG tablet Take 10 mg by mouth daily.      . Multiple Vitamin (MULTIVITAMIN)  capsule Take 1 capsule by mouth daily.      Marland Kitchen omeprazole (PRILOSEC) 20 MG capsule Take by mouth. TAKE 1 CAPSULE THREE DAYS A WEEK    . potassium chloride SA (KLOR-CON M20) 20 MEQ tablet Take 1 tablet (20 mEq total) by mouth daily. 90 tablet 3  . senna (SENOKOT) 8.6 MG tablet Take 2 tablets by mouth daily.      . SUMAtriptan (IMITREX) 25 MG tablet TAKE 1 TABLET DAILY AS NEEDED 4 tablet 5  . temazepam (RESTORIL) 30 MG capsule TAKE 1 CAPSULE BY MOUTH AT BEDTIME AS NEEDED (Patient taking differently: TAKE 1 CAPSULE BY MOUTH AT BEDTIME AS NEEDED FOR SLEEP) 90 capsule 1  . verapamil (CALAN-SR) 180 MG CR tablet TAKE 1 TABLET DAILY (Patient taking differently: TAKE 1 TABLET BY MOUTH DAILY) 90 tablet 3  . warfarin (COUMADIN) 5 MG tablet Take as directed by coumadin clinic 90 tablet 1   No current facility-administered medications on file prior to visit.    Allergies  Allergen Reactions  . Latex Hives  . Meperidine Hcl Nausea And Vomiting  . Sulfamethoxazole Nausea And Vomiting    REACTION: unspecified    Past Medical History  Diagnosis Date  .  Hyperlipidemia   . Hypertension   . Hx of breast cancer   . GERD (gastroesophageal reflux disease)   . Osteopenia   . Status post aortic valve repair   . FHx: migraine headaches   . Menopausal syndrome   . Aortic valve replaced   . Hiatal hernia   . Hx Breast Cancer, IDC, Stage I 07/03/2003  . 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 08/22/2011    Past Surgical History  Procedure Laterality Date  . Lummpectomy breast cancer  07/03/2003  . Aortic valve replacement  2003  . Hysterectomy, endometiral cancer  2001  . Right shouler surgery    . [employer] fundoplicaiton  [id number]    post-op hematoma on heparin   . Hiatal hernia repair    . Cardiac catheterization      Ejection Fraction     History  Smoking status  . Never Smoker   Smokeless tobacco  . Never Used    History  Alcohol Use  . Yes    Comment: socially    Family History  Problem  Relation Age of Onset  . Heart attack Mother 102  . Osteoarthritis Brother     hpercholesterolemia    Reviw of Systems:  Reviewed in the HPI.  All other systems are negative.  Physical Exam: BP 130/82 mmHg  Pulse 68  Ht 5\' 1"  (1.549 m)  Wt 131 lb 1.9 oz (59.476 kg)  BMI 24.79 kg/m2 The patient is alert and oriented x 3.  The mood and affect are normal.   Skin: warm and dry.  Color is normal.    HEENT:   the sclera are nonicteric.  The mucous membranes are moist.  The carotids are 2+ without bruits.  There is no thyromegaly.  There is no JVD.    Lungs: clear.  The chest wall is non tender.    Heart: regular rate with a normal S1 and mechanical S2.  There are no murmurs, gallops, or rubs. The PMI is not displaced.     Abdomen: good bowel sounds.  There is no guarding or rebound.  There is no hepatosplenomegaly or tenderness.  There are no masses.   Extremities:  no clubbing, cyanosis, or edema.  The legs are without rashes.  The distal pulses are intact.   Neuro:  Cranial nerves II - XII are intact.  Motor and sensory functions are intact.    The gait is normal.  ECG: Nov. 14, 2016:  NSR at 33.    Assessment / Plan:   1. Aortic valve replacement - continue current meds.   To coumadin clinic today   2. Hyperlipidemia - lipids look great   3. Breast cancer - stable     Nahser, Wonda Cheng, MD  01/19/2015 11:03 AM    [place],  [place], [place]  [address] Pager [phone] Phone: [phone]; Fax: [phone]   [facility]  [address] [place], [place]  [address] [phone]   Fax [phone]

## 2015-02-05 ENCOUNTER — Ambulatory Visit: Payer: Medicare Other | Admitting: Hematology & Oncology

## 2015-02-05 ENCOUNTER — Other Ambulatory Visit: Payer: Medicare Other

## 2015-02-12 ENCOUNTER — Other Ambulatory Visit (HOSPITAL_BASED_OUTPATIENT_CLINIC_OR_DEPARTMENT_OTHER): Payer: Medicare Other

## 2015-02-12 ENCOUNTER — Encounter: Payer: Self-pay | Admitting: Hematology & Oncology

## 2015-02-12 ENCOUNTER — Ambulatory Visit (HOSPITAL_BASED_OUTPATIENT_CLINIC_OR_DEPARTMENT_OTHER): Payer: Medicare Other | Admitting: Hematology & Oncology

## 2015-02-12 VITALS — BP 139/80 | HR 75 | Temp 98.7°F | Resp 20 | Ht 61.0 in | Wt 131.0 lb

## 2015-02-12 DIAGNOSIS — Z853 Personal history of malignant neoplasm of breast: Secondary | ICD-10-CM | POA: Diagnosis not present

## 2015-02-12 DIAGNOSIS — G4701 Insomnia due to medical condition: Secondary | ICD-10-CM

## 2015-02-12 LAB — COMPREHENSIVE METABOLIC PANEL
ALBUMIN: 4.4 g/dL (ref 3.5–5.0)
ALK PHOS: 56 U/L (ref 40–150)
ALT: 39 U/L (ref 0–55)
ANION GAP: 14 meq/L — AB (ref 3–11)
AST: 35 U/L — ABNORMAL HIGH (ref 5–34)
BILIRUBIN TOTAL: 0.45 mg/dL (ref 0.20–1.20)
BUN: 14.7 mg/dL (ref 7.0–26.0)
CO2: 24 meq/L (ref 22–29)
CREATININE: 1 mg/dL (ref 0.6–1.1)
Calcium: 10.2 mg/dL (ref 8.4–10.4)
Chloride: 104 mEq/L (ref 98–109)
EGFR: 57 mL/min/{1.73_m2} — AB (ref >90)
GLUCOSE: 112 mg/dL (ref 70–140)
Potassium: 3.8 mEq/L (ref 3.5–5.1)
Sodium: 142 mEq/L (ref 136–145)
TOTAL PROTEIN: 7.6 g/dL (ref 6.4–8.3)

## 2015-02-12 LAB — CBC WITH DIFFERENTIAL (CANCER CENTER ONLY)
BASO#: 0 10*3/uL (ref 0.0–0.2)
BASO%: 0.2 % (ref 0.0–2.0)
EOS ABS: 0 10*3/uL (ref 0.0–0.5)
EOS%: 0.7 % (ref 0.0–7.0)
HEMATOCRIT: 43.6 % (ref 34.8–46.6)
HEMOGLOBIN: 14 g/dL (ref 11.6–15.9)
LYMPH#: 1 10*3/uL (ref 0.9–3.3)
LYMPH%: 17 % (ref 14.0–48.0)
MCH: 27.9 pg (ref 26.0–34.0)
MCHC: 32.1 g/dL (ref 32.0–36.0)
MCV: 87 fL (ref 81–101)
MONO#: 0.5 10*3/uL (ref 0.1–0.9)
MONO%: 8 % (ref 0.0–13.0)
NEUT%: 74.1 % (ref 39.6–80.0)
NEUTROS ABS: 4.5 10*3/uL (ref 1.5–6.5)
PLATELETS: 328 10*3/uL (ref 145–400)
RBC: 5.01 10*6/uL (ref 3.70–5.32)
RDW: 13.3 % (ref 11.1–15.7)
WBC: 6.1 10*3/uL (ref 3.9–10.0)

## 2015-02-12 MED ORDER — TEMAZEPAM 30 MG PO CAPS: 30.0000 mg | ORAL_CAPSULE | Freq: Every evening | ORAL | Status: DC | PRN

## 2015-02-12 MED ORDER — TEMAZEPAM 30 MG PO CAPS
30.0000 mg | ORAL_CAPSULE | Freq: Every evening | ORAL | Status: DC | PRN
Start: 2015-02-12 — End: 2015-07-13

## 2015-02-12 MED ORDER — ALPRAZOLAM 0.25 MG PO TABS
0.2500 mg | ORAL_TABLET | Freq: Every evening | ORAL | Status: DC | PRN
Start: 2015-02-12 — End: 2015-05-16

## 2015-02-12 NOTE — Progress Notes (Signed)
Hematology and Oncology Follow Up Visit  Amanda Wiggins [id number] 1946/01/10 69 y.o. 02/12/2015   Principle Diagnosis:  1. Stage I (T1 N0 M0) ductal carcinoma of the left breast. 2. Mechanical aortic valve.  Current Therapy:    Coumadin-lifelong  Zometa 5 mg IV q. year     Interim History:  [name] is is back for followup. She is doing okay . She was last seen back in  June. Unfortunately, her husband is not doing too well. He's had 2  back surgeries. He is trying to recover from this.  She is on Coumadin for a mechanical heart valve. She has had some issues with the INR. She's being followed cardiology for this.   she has had no problems with bowels or bladder.  She does state that she does forget to take her potassium every now and then.  She's had no leg swelling.  She's had no fever. She's had no nausea or vomiting. She's had no headache.    Medications:  Current outpatient prescriptions:  .  BIOTIN PO, Take 1 tablet by mouth every morning. , Disp: , Rfl:  .  Calcium Carbonate-Vitamin D (CALCIUM 600+D) 600-400 MG-UNIT per tablet, Take 1 tablet by mouth 2 (two) times daily.  , Disp: , Rfl:  .  chlorthalidone (HYGROTON) 25 MG tablet, TAKE 1 TABLET DAILY FOR FOUR DAYS PER WEEK, Disp: 90 tablet, Rfl: 0 .  CRESTOR 10 MG tablet, TAKE 1 TABLET AT BEDTIME, Disp: 90 tablet, Rfl: 0 .  cycloSPORINE (RESTASIS) 0.05 % ophthalmic emulsion, Place 1 drop into both eyes 2 (two) times daily as needed. , Disp: , Rfl:  .  Desoximetasone 0.05 % GEL, Apply topically daily as needed (AS NEEDED FOR AFFECTED AREAS). , Disp: , Rfl: 6 .  FLUZONE HIGH-DOSE 0.5 ML SUSY, Inject as directed once., Disp: , Rfl: 0 .  Glucosamine-Chondroit-Vit C-Mn (GLUCOSAMINE CHONDR 1500 COMPLX) CAPS, Take by mouth every morning., Disp: , Rfl:  .  loratadine (CLARITIN) 10 MG tablet, Take 10 mg by mouth daily.  , Disp: , Rfl:  .  Multiple Vitamin (MULTIVITAMIN) capsule, Take 1 capsule by mouth daily.  , Disp: ,  Rfl:  .  omeprazole (PRILOSEC) 20 MG capsule, Take by mouth. TAKE 1 CAPSULE THREE DAYS A WEEK, Disp: , Rfl:  .  potassium chloride SA (KLOR-CON M20) 20 MEQ tablet, Take 1 tablet (20 mEq total) by mouth daily., Disp: 90 tablet, Rfl: 3 .  senna (SENOKOT) 8.6 MG tablet, Take 2 tablets by mouth daily.  , Disp: , Rfl:  .  SUMAtriptan (IMITREX) 25 MG tablet, TAKE 1 TABLET DAILY AS NEEDED, Disp: 4 tablet, Rfl: 5 .  temazepam (RESTORIL) 30 MG capsule, Take 1 capsule (30 mg total) by mouth at bedtime as needed., Disp: 90 capsule, Rfl: 2 .  verapamil (CALAN-SR) 180 MG CR tablet, TAKE 1 TABLET DAILY (Patient taking differently: TAKE 1 TABLET BY MOUTH DAILY), Disp: 90 tablet, Rfl: 3 .  warfarin (COUMADIN) 5 MG tablet, Take as directed by coumadin clinic, Disp: 90 tablet, Rfl: 1 .  ALPRAZolam (XANAX) 0.25 MG tablet, Take 1 tablet (0.25 mg total) by mouth at bedtime as needed for anxiety., Disp: 30 tablet, Rfl: 2  Allergies:  Allergies  Allergen Reactions  . Latex Hives  . Meperidine Hcl Nausea And Vomiting  . Sulfamethoxazole Nausea And Vomiting    REACTION: unspecified    Past Medical History, Surgical history, Social history, and Family History were reviewed and updated.  Review  of Systems: As above  Physical Exam:  height is 5\' 1"  (1.549 m) and weight is 131 lb (59.421 kg). Her oral temperature is 98.7 F (37.1 C). Her blood pressure is 139/80 and her pulse is 75. Her respiration is 20.   Head and neck exam shows no ocular or oral lesions. She has no palpable cervical or supraclavicular lymph nodes. Lungs are clear. Cardiac exam regular rate and rhythm. There are no murmurs, rubs or bruits. She has a systolic click from her mechanical aortic valve. Her breast exam shows right breast no masses edema or erythema. The right axillary adenopathy. Left breast has a well-healed lumpectomy scar at the 12:00 position. There is no left axillary adenopathy. Abdomen is soft. She has good bowel sounds. There is  no palpable liver or spleen. Back exam shows no tenderness over the spine, ribs or hips. Extremities shows no clubbing cyanosis or edema. Skin exam no rashes, ecchymoses or petechia. Neurological exam is nonfocal.  Lab Results  Component Value Date   WBC 6.1 02/12/2015   HGB 14.0 02/12/2015   HCT 43.6 02/12/2015   MCV 87 02/12/2015   PLT 328 02/12/2015     Chemistry      Component Value Date/Time   NA 142 02/12/2015 1125   NA 136 08/06/2014 0935   NA 142 06/23/2014 0840   K 3.8 02/12/2015 1125   K 3.3 08/06/2014 0935   K 4.9 06/23/2014 0840   CL 99 08/06/2014 0935   CL 104 06/23/2014 0840   CO2 24 02/12/2015 1125   CO2 30 08/06/2014 0935   CO2 30 06/23/2014 0840   BUN 14.7 02/12/2015 1125   BUN 17 08/06/2014 0935   BUN 20 06/23/2014 0840   CREATININE 1.0 02/12/2015 1125   CREATININE 1.1 08/06/2014 0935   CREATININE 0.92 06/23/2014 0840      Component Value Date/Time   CALCIUM 10.2 02/12/2015 1125   CALCIUM 9.0 08/06/2014 0935   CALCIUM 10.2 06/23/2014 0840   ALKPHOS 56 02/12/2015 1125   ALKPHOS 54 08/06/2014 0935   ALKPHOS 51 06/23/2014 0840   AST 35* 02/12/2015 1125   AST 34 08/06/2014 0935   AST 29 06/23/2014 0840   ALT 39 02/12/2015 1125   ALT 31 08/06/2014 0935   ALT 28 06/23/2014 0840   BILITOT 0.45 02/12/2015 1125   BILITOT 0.70 08/06/2014 0935   BILITOT 0.4 06/23/2014 0840         Impression and Plan: Amanda Wiggins is 69-year  old female with a history of stage I infiltrating duct carcinoma the left breast. She underwent lumpectomy.  She  had been on Femara. She had radiation. It has been 10  years now.  We'll plan for another followup in 6 months.  She will get Zometa when she comes back     .Volanda Napoleon, MD 12/8/20165:26 PM

## 2015-02-13 ENCOUNTER — Encounter: Payer: Self-pay | Admitting: *Deleted

## 2015-02-13 LAB — VITAMIN D 25 HYDROXY (VIT D DEFICIENCY, FRACTURES): Vit D, 25-Hydroxy: 46 ng/mL (ref 30–100)

## 2015-02-25 ENCOUNTER — Other Ambulatory Visit: Payer: Self-pay | Admitting: Internal Medicine

## 2015-03-04 ENCOUNTER — Ambulatory Visit (INDEPENDENT_AMBULATORY_CARE_PROVIDER_SITE_OTHER): Payer: Medicare Other | Admitting: Pharmacist

## 2015-03-04 DIAGNOSIS — Z5181 Encounter for therapeutic drug level monitoring: Secondary | ICD-10-CM | POA: Diagnosis not present

## 2015-03-04 DIAGNOSIS — Z954 Presence of other heart-valve replacement: Secondary | ICD-10-CM | POA: Diagnosis not present

## 2015-03-04 DIAGNOSIS — Z7901 Long term (current) use of anticoagulants: Secondary | ICD-10-CM | POA: Diagnosis not present

## 2015-03-04 DIAGNOSIS — I359 Nonrheumatic aortic valve disorder, unspecified: Secondary | ICD-10-CM

## 2015-03-04 DIAGNOSIS — Z952 Presence of prosthetic heart valve: Secondary | ICD-10-CM

## 2015-03-04 LAB — POCT INR: INR: 2.5

## 2015-03-22 ENCOUNTER — Other Ambulatory Visit: Payer: Self-pay | Admitting: Family Medicine

## 2015-04-06 ENCOUNTER — Other Ambulatory Visit: Payer: Self-pay | Admitting: Cardiovascular Disease

## 2015-04-10 ENCOUNTER — Other Ambulatory Visit: Payer: Self-pay

## 2015-04-10 DIAGNOSIS — Z1231 Encounter for screening mammogram for malignant neoplasm of breast: Secondary | ICD-10-CM

## 2015-04-15 ENCOUNTER — Ambulatory Visit (INDEPENDENT_AMBULATORY_CARE_PROVIDER_SITE_OTHER): Payer: Medicare Other | Admitting: Pharmacist

## 2015-04-15 DIAGNOSIS — I359 Nonrheumatic aortic valve disorder, unspecified: Secondary | ICD-10-CM | POA: Diagnosis not present

## 2015-04-15 DIAGNOSIS — Z5181 Encounter for therapeutic drug level monitoring: Secondary | ICD-10-CM

## 2015-04-15 DIAGNOSIS — Z7901 Long term (current) use of anticoagulants: Secondary | ICD-10-CM

## 2015-04-15 DIAGNOSIS — Z954 Presence of other heart-valve replacement: Secondary | ICD-10-CM | POA: Diagnosis not present

## 2015-04-15 DIAGNOSIS — Z952 Presence of prosthetic heart valve: Secondary | ICD-10-CM

## 2015-04-15 LAB — POCT INR: INR: 2.7

## 2015-04-24 ENCOUNTER — Encounter: Payer: Self-pay | Admitting: Internal Medicine

## 2015-04-24 ENCOUNTER — Ambulatory Visit (INDEPENDENT_AMBULATORY_CARE_PROVIDER_SITE_OTHER): Payer: Medicare Other | Admitting: Internal Medicine

## 2015-04-24 VITALS — BP 128/80 | HR 69 | Temp 97.9°F | Resp 20 | Ht 61.0 in | Wt 131.0 lb

## 2015-04-24 DIAGNOSIS — Z853 Personal history of malignant neoplasm of breast: Secondary | ICD-10-CM

## 2015-04-24 DIAGNOSIS — I10 Essential (primary) hypertension: Secondary | ICD-10-CM | POA: Diagnosis not present

## 2015-04-24 DIAGNOSIS — J309 Allergic rhinitis, unspecified: Secondary | ICD-10-CM | POA: Diagnosis not present

## 2015-04-24 MED ORDER — AMOXICILLIN-POT CLAVULANATE 875-125 MG PO TABS: 1.0000 | ORAL_TABLET | Freq: Two times a day (BID) | ORAL | Status: DC

## 2015-04-24 MED ORDER — DESOXIMETASONE 0.05 % EX GEL: CUTANEOUS | Status: DC

## 2015-04-24 NOTE — Progress Notes (Signed)
Subjective:    Patient ID: Amanda Wiggins, female    DOB: 1945-08-28, 70 y.o.   MRN: [id number]  HPI 70 year old patient who is status post aVR.  She has a history of essential hypertension.  Also has a history of allergic rhinitis.  The past 2 and a half weeks she has had sore throat, cough, and a chief complaint of extreme sinus congestion.  She has had fullness in the sinus area and thick green drainage.  She describes associated headaches.  She has fullness in both ears.  She has been using a MediPort and Tylenol.  Otherwise no other treatment.  She remains on Coumadin anticoagulation.  Past Medical History  Diagnosis Date  . Hyperlipidemia   . Hypertension   . Hx of breast cancer   . GERD (gastroesophageal reflux disease)   . Osteopenia   . Status post aortic valve repair   . FHx: migraine headaches   . Menopausal syndrome   . Aortic valve replaced   . Hiatal hernia   . Hx Breast Cancer, IDC, Stage I 07/03/2003  . 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 08/22/2011    Social History   Social History  . Marital Status: Married    Spouse Name: N/A  . Number of Children: N/A  . Years of Education: N/A   Occupational History  . Not on file.   Social History Main Topics  . Smoking status: Never Smoker   . Smokeless tobacco: Never Used  . Alcohol Use: 0.0 oz/week    0 Standard drinks or equivalent per week     Comment: socially  . Drug Use: No  . Sexual Activity: Not on file   Other Topics Concern  . Not on file   Social History Narrative    Past Surgical History  Procedure Laterality Date  . Lummpectomy breast cancer  07/03/2003  . Aortic valve replacement  2003  . Hysterectomy, endometiral cancer  2001  . Right shouler surgery    . [employer] fundoplicaiton  [id number]    post-op hematoma on heparin   . Hiatal hernia repair    . Cardiac catheterization      Ejection Fraction     Family History  Problem Relation Age of Onset  . Heart attack Mother 65  .  Osteoarthritis Brother     hpercholesterolemia    Allergies  Allergen Reactions  . Latex Hives  . Meperidine Hcl Nausea And Vomiting  . Sulfamethoxazole Nausea And Vomiting    REACTION: unspecified    Current Outpatient Prescriptions on File Prior to Visit  Medication Sig Dispense Refill  . ALPRAZolam (XANAX) 0.25 MG tablet Take 1 tablet (0.25 mg total) by mouth at bedtime as needed for anxiety. 30 tablet 2  . BIOTIN PO Take 1 tablet by mouth every morning.     . Calcium Carbonate-Vitamin D (CALCIUM 600+D) 600-400 MG-UNIT per tablet Take 1 tablet by mouth 2 (two) times daily.      . chlorthalidone (HYGROTON) 25 MG tablet TAKE 1 TABLET DAILY FOR FOUR DAYS PER WEEK 90 tablet 2  . cycloSPORINE (RESTASIS) 0.05 % ophthalmic emulsion Place 1 drop into both eyes 2 (two) times daily as needed.     . Desoximetasone 0.05 % GEL Apply topically daily as needed (AS NEEDED FOR AFFECTED AREAS).   6  . FLUZONE HIGH-DOSE 0.5 ML SUSY Inject as directed once.  0  . Glucosamine-Chondroit-Vit C-Mn (GLUCOSAMINE CHONDR 1500 COMPLX) CAPS Take by mouth every morning.    Marland Kitchen  loratadine (CLARITIN) 10 MG tablet Take 10 mg by mouth daily.      . Multiple Vitamin (MULTIVITAMIN) capsule Take 1 capsule by mouth daily.      Marland Kitchen omeprazole (PRILOSEC) 20 MG capsule Take by mouth. TAKE 1 CAPSULE THREE DAYS A WEEK    . potassium chloride SA (KLOR-CON M20) 20 MEQ tablet Take 1 tablet (20 mEq total) by mouth daily. 90 tablet 3  . rosuvastatin (CRESTOR) 10 MG tablet TAKE 1 TABLET AT BEDTIME 90 tablet 1  . senna (SENOKOT) 8.6 MG tablet Take 2 tablets by mouth daily.      . SUMAtriptan (IMITREX) 25 MG tablet TAKE 1 TABLET DAILY AS NEEDED 4 tablet 5  . temazepam (RESTORIL) 30 MG capsule Take 1 capsule (30 mg total) by mouth at bedtime as needed. 90 capsule 2  . verapamil (CALAN-SR) 180 MG CR tablet TAKE 1 TABLET DAILY (Patient taking differently: TAKE 1 TABLET BY MOUTH DAILY) 90 tablet 3  . warfarin (COUMADIN) 5 MG tablet TAKE AS  DIRECTED BY COUMADIN CLINIC 90 tablet 1   No current facility-administered medications on file prior to visit.    BP 128/80 mmHg  Pulse 69  Temp(Src) 97.9 F (36.6 C) (Oral)  Resp 20  Ht 5\' 1"  (1.549 m)  Wt 131 lb (59.421 kg)  BMI 24.76 kg/m2  SpO2 98%      Review of Systems  Constitutional: Positive for activity change, appetite change and fatigue.  HENT: Positive for congestion, postnasal drip, sinus pressure and sore throat. Negative for dental problem, hearing loss, rhinorrhea and tinnitus.   Eyes: Negative for pain, discharge and visual disturbance.  Respiratory: Positive for cough. Negative for shortness of breath.   Cardiovascular: Negative for chest pain, palpitations and leg swelling.  Gastrointestinal: Negative for nausea, vomiting, abdominal pain, diarrhea, constipation, blood in stool and abdominal distention.  Genitourinary: Negative for dysuria, urgency, frequency, hematuria, flank pain, vaginal bleeding, vaginal discharge, difficulty urinating, vaginal pain and pelvic pain.  Musculoskeletal: Negative for joint swelling, arthralgias and gait problem.  Skin: Negative for rash.  Neurological: Negative for dizziness, syncope, speech difficulty, weakness, numbness and headaches.  Hematological: Negative for adenopathy.  Psychiatric/Behavioral: Negative for behavioral problems, dysphoric mood and agitation. The patient is not nervous/anxious.        Objective:   Physical Exam  Constitutional: She is oriented to person, place, and time. She appears well-developed and well-nourished.  HENT:  Head: Normocephalic.  Right Ear: External ear normal.  Left Ear: External ear normal.  Mouth/Throat: Oropharynx is clear and moist.  Pansinus tenderness  Eyes: Conjunctivae and EOM are normal. Pupils are equal, round, and reactive to light.  Neck: Normal range of motion. Neck supple. No thyromegaly present.  Cardiovascular: Normal rate, regular rhythm, normal heart sounds and  intact distal pulses.   Pulmonary/Chest: Effort normal and breath sounds normal.  Abdominal: Soft. Bowel sounds are normal. She exhibits no mass. There is no tenderness.  Musculoskeletal: Normal range of motion.  Lymphadenopathy:    She has no cervical adenopathy.  Neurological: She is alert and oriented to person, place, and time.  Skin: Skin is warm and dry. No rash noted.  Psychiatric: She has a normal mood and affect. Her behavior is normal.          Assessment & Plan:   Sinusitis.  Cannot rule out subacute bacterial sinusitis.  Will institute more aggressive local measures, as well as expectorants.  Will hold antibiotic therapy for at least 48 hours.  Will  treat with Augmentin if clinical worsening History of allergic rhinitis Hypertension, well-controlled

## 2015-04-24 NOTE — Progress Notes (Signed)
Pre visit review using our clinic review tool, if applicable. No additional management support is needed unless otherwise documented below in the visit note. 

## 2015-04-24 NOTE — Patient Instructions (Signed)
Acute sinusitis symptoms for less than 10 days are generally not helped by antibiotic therapy.  Use saline irrigation, warm  moist compresses and over-the-counter decongestants only as directed.  Call if there is no improvement in 5 to 7 days, or sooner if you develop increasing pain, fever, or any new symptoms.  TREATMENT  Most cases of acute sinusitis are related to a viral infection and will resolve on their own within 10 days. Sometimes, medicines are prescribed to help relieve symptoms of both acute and chronic sinusitis. These may include pain medicines, decongestants, nasal steroid sprays, or saline sprays.  However, for sinusitis related to a bacterial infection, your health care provider will prescribe antibiotic medicines. These are medicines that will help kill the bacteria causing the infection.  Rarely, sinusitis is caused by a fungal infection. In these cases, your health care provider will prescribe antifungal medicine.  For some cases of chronic sinusitis, surgery is needed. Generally, these are cases in which sinusitis recurs more than 3 times per year, despite other treatments.  HOME CARE INSTRUCTIONS  Drink plenty of water. Water helps thin the mucus so your sinuses can drain more easily.  Use a humidifier.  Inhale steam 3-4 times a day (for example, sit in the bathroom with the shower running).  Apply a warm, moist washcloth to your face 3-4 times a day, or as directed by your health care provider.  Use saline nasal sprays to help moisten and clean your sinuses.  Take medicines only as directed by your health care provider.  If you were prescribed either an antibiotic or antifungal medicine, finish it all even if you start to feel better. SEEK IMMEDIATE MEDICAL CARE IF:  You have increasing pain or severe headaches.  You have nausea, vomiting, or drowsiness.  You have swelling around your face.  You have vision problems.  You have a stiff neck.  You have difficulty  breathing.

## 2015-04-27 ENCOUNTER — Telehealth: Payer: Self-pay | Admitting: *Deleted

## 2015-04-27 NOTE — Telephone Encounter (Signed)
Pt called to inform us that she is taking Augmentin 875mg  twice a day for 10 days;she started on 04/24/15. Advised that the medication does not interfere with Coumadin & she verbalized understanding.  Advised to call back with any other issues.

## 2015-05-16 ENCOUNTER — Inpatient Hospital Stay (HOSPITAL_COMMUNITY)
Admission: EM | Admit: 2015-05-16 | Discharge: 2015-05-23 | DRG: 247 | Disposition: A | Discharge status: Home / Self Care | Payer: Medicare Other | Attending: Cardiology | Admitting: Cardiology

## 2015-05-16 ENCOUNTER — Encounter (HOSPITAL_COMMUNITY)
Admission: EM | Disposition: A | Discharge status: Home / Self Care | Payer: Self-pay | Source: Home / Self Care | Attending: Cardiology

## 2015-05-16 ENCOUNTER — Encounter (HOSPITAL_COMMUNITY): Payer: Self-pay | Admitting: *Deleted

## 2015-05-16 DIAGNOSIS — I110 Hypertensive heart disease with heart failure: Secondary | ICD-10-CM | POA: Diagnosis present

## 2015-05-16 DIAGNOSIS — N951 Menopausal and female climacteric states: Secondary | ICD-10-CM | POA: Diagnosis present

## 2015-05-16 DIAGNOSIS — I213 ST elevation (STEMI) myocardial infarction of unspecified site: Secondary | ICD-10-CM

## 2015-05-16 DIAGNOSIS — I25119 Atherosclerotic heart disease of native coronary artery with unspecified angina pectoris: Secondary | ICD-10-CM | POA: Diagnosis present

## 2015-05-16 DIAGNOSIS — Z7901 Long term (current) use of anticoagulants: Secondary | ICD-10-CM

## 2015-05-16 DIAGNOSIS — Z952 Presence of prosthetic heart valve: Secondary | ICD-10-CM

## 2015-05-16 DIAGNOSIS — Z91040 Latex allergy status: Secondary | ICD-10-CM | POA: Diagnosis not present

## 2015-05-16 DIAGNOSIS — Z853 Personal history of malignant neoplasm of breast: Secondary | ICD-10-CM | POA: Diagnosis not present

## 2015-05-16 DIAGNOSIS — I255 Ischemic cardiomyopathy: Secondary | ICD-10-CM | POA: Diagnosis present

## 2015-05-16 DIAGNOSIS — Z79899 Other long term (current) drug therapy: Secondary | ICD-10-CM

## 2015-05-16 DIAGNOSIS — R079 Chest pain, unspecified: Secondary | ICD-10-CM

## 2015-05-16 DIAGNOSIS — I2109 ST elevation (STEMI) myocardial infarction involving other coronary artery of anterior wall: Secondary | ICD-10-CM | POA: Diagnosis not present

## 2015-05-16 DIAGNOSIS — I220 Subsequent ST elevation (STEMI) myocardial infarction of anterior wall: Secondary | ICD-10-CM | POA: Diagnosis not present

## 2015-05-16 DIAGNOSIS — I5042 Chronic combined systolic (congestive) and diastolic (congestive) heart failure: Secondary | ICD-10-CM

## 2015-05-16 DIAGNOSIS — I2510 Atherosclerotic heart disease of native coronary artery without angina pectoris: Secondary | ICD-10-CM | POA: Diagnosis not present

## 2015-05-16 DIAGNOSIS — E785 Hyperlipidemia, unspecified: Secondary | ICD-10-CM | POA: Diagnosis present

## 2015-05-16 DIAGNOSIS — K449 Diaphragmatic hernia without obstruction or gangrene: Secondary | ICD-10-CM | POA: Diagnosis present

## 2015-05-16 DIAGNOSIS — I350 Nonrheumatic aortic (valve) stenosis: Secondary | ICD-10-CM | POA: Diagnosis not present

## 2015-05-16 DIAGNOSIS — Z888 Allergy status to other drugs, medicaments and biological substances status: Secondary | ICD-10-CM | POA: Diagnosis not present

## 2015-05-16 DIAGNOSIS — I5022 Chronic systolic (congestive) heart failure: Secondary | ICD-10-CM

## 2015-05-16 DIAGNOSIS — I25111 Atherosclerotic heart disease of native coronary artery with angina pectoris with documented spasm: Secondary | ICD-10-CM | POA: Diagnosis not present

## 2015-05-16 DIAGNOSIS — I10 Essential (primary) hypertension: Secondary | ICD-10-CM | POA: Diagnosis present

## 2015-05-16 DIAGNOSIS — I2102 ST elevation (STEMI) myocardial infarction involving left anterior descending coronary artery: Secondary | ICD-10-CM | POA: Diagnosis present

## 2015-05-16 DIAGNOSIS — Z8249 Family history of ischemic heart disease and other diseases of the circulatory system: Secondary | ICD-10-CM

## 2015-05-16 DIAGNOSIS — I2121 ST elevation (STEMI) myocardial infarction involving left circumflex coronary artery: Secondary | ICD-10-CM | POA: Diagnosis not present

## 2015-05-16 DIAGNOSIS — Z9861 Coronary angioplasty status: Secondary | ICD-10-CM

## 2015-05-16 DIAGNOSIS — Z954 Presence of other heart-valve replacement: Secondary | ICD-10-CM | POA: Diagnosis not present

## 2015-05-16 DIAGNOSIS — I959 Hypotension, unspecified: Secondary | ICD-10-CM | POA: Diagnosis not present

## 2015-05-16 DIAGNOSIS — K219 Gastro-esophageal reflux disease without esophagitis: Secondary | ICD-10-CM | POA: Diagnosis present

## 2015-05-16 DIAGNOSIS — Z955 Presence of coronary angioplasty implant and graft: Secondary | ICD-10-CM

## 2015-05-16 HISTORY — PX: CARDIAC CATHETERIZATION: SHX172

## 2015-05-16 LAB — PROTIME-INR
INR: 1.8 — ABNORMAL HIGH (ref 0.00–1.49)
Prothrombin Time: 20.9 seconds — ABNORMAL HIGH (ref 11.6–15.2)

## 2015-05-16 LAB — DIFFERENTIAL
Basophils Absolute: 0 10*3/uL (ref 0.0–0.1)
Basophils Relative: 0 %
EOS ABS: 0.1 10*3/uL (ref 0.0–0.7)
EOS PCT: 1 %
LYMPHS ABS: 2 10*3/uL (ref 0.7–4.0)
Lymphocytes Relative: 21 %
MONOS PCT: 6 %
Monocytes Absolute: 0.6 10*3/uL (ref 0.1–1.0)
Neutro Abs: 7.1 10*3/uL (ref 1.7–7.7)
Neutrophils Relative %: 72 %

## 2015-05-16 LAB — BASIC METABOLIC PANEL
ANION GAP: 12 (ref 5–15)
BUN: 21 mg/dL — ABNORMAL HIGH (ref 6–20)
CO2: 23 mmol/L (ref 22–32)
Calcium: 9.1 mg/dL (ref 8.9–10.3)
Chloride: 106 mmol/L (ref 101–111)
Creatinine, Ser: 0.87 mg/dL (ref 0.44–1.00)
GFR calc Af Amer: >60 mL/min (ref >60)
GFR calc non Af Amer: >60 mL/min (ref >60)
GLUCOSE: 130 mg/dL — AB (ref 65–99)
POTASSIUM: 4.1 mmol/L (ref 3.5–5.1)
Sodium: 141 mmol/L (ref 135–145)

## 2015-05-16 LAB — POCT I-STAT, CHEM 8
BUN: 26 mg/dL — ABNORMAL HIGH (ref 6–20)
CHLORIDE: 105 mmol/L (ref 101–111)
CREATININE: 0.8 mg/dL (ref 0.44–1.00)
Calcium, Ion: 1.03 mmol/L — ABNORMAL LOW (ref 1.13–1.30)
GLUCOSE: 126 mg/dL — AB (ref 65–99)
HCT: 45 % (ref 36.0–46.0)
Hemoglobin: 15.3 g/dL — ABNORMAL HIGH (ref 12.0–15.0)
POTASSIUM: 3.8 mmol/L (ref 3.5–5.1)
Sodium: 142 mmol/L (ref 135–145)
TCO2: 21 mmol/L (ref 0–100)

## 2015-05-16 LAB — CBC
HEMATOCRIT: 43.1 % (ref 36.0–46.0)
HEMOGLOBIN: 13.9 g/dL (ref 12.0–15.0)
MCH: 27.8 pg (ref 26.0–34.0)
MCHC: 32.3 g/dL (ref 30.0–36.0)
MCV: 86.2 fL (ref 78.0–100.0)
Platelets: 319 10*3/uL (ref 150–400)
RBC: 5 MIL/uL (ref 3.87–5.11)
RDW: 13.5 % (ref 11.5–15.5)
WBC: 9.9 10*3/uL (ref 4.0–10.5)

## 2015-05-16 LAB — POCT I-STAT TROPONIN I: TROPONIN I, POC: 6.58 ng/mL — AB (ref 0.00–0.08)

## 2015-05-16 LAB — APTT: aPTT: 34 seconds (ref 24–37)

## 2015-05-16 LAB — TROPONIN I: TROPONIN I: 51.54 ng/mL — AB (ref <0.031)

## 2015-05-16 SURGERY — LEFT HEART CATH AND CORONARY ANGIOGRAPHY
Anesthesia: LOCAL

## 2015-05-16 MED ORDER — VERAPAMIL HCL 2.5 MG/ML IV SOLN
INTRAVENOUS | Status: DC | PRN
  Administered 2015-05-16: 200 ug via INTRACORONARY

## 2015-05-16 MED ORDER — CYCLOSPORINE 0.05 % OP EMUL
1.0000 [drp] | Freq: Two times a day (BID) | OPHTHALMIC | Status: DC
  Administered 2015-05-17: 18:00:00 via OPHTHALMIC
  Administered 2015-05-17 – 2015-05-23 (×10): 1 [drp] via OPHTHALMIC
  Filled 2015-05-16 (×13): qty 1

## 2015-05-16 MED ORDER — IOHEXOL 350 MG/ML SOLN
INTRAVENOUS | Status: DC | PRN
  Administered 2015-05-16: 230 mL via INTRA_ARTERIAL

## 2015-05-16 MED ORDER — SODIUM CHLORIDE 0.9 % IV SOLN: 250.0000 mL | INTRAVENOUS | Status: DC | PRN

## 2015-05-16 MED ORDER — MORPHINE SULFATE (PF) 2 MG/ML IV SOLN
2.0000 mg | INTRAVENOUS | Status: DC | PRN
  Administered 2015-05-16 – 2015-05-17 (×3): 2 mg via INTRAVENOUS
  Filled 2015-05-16 (×3): qty 1

## 2015-05-16 MED ORDER — FENTANYL CITRATE (PF) 100 MCG/2ML IJ SOLN
INTRAMUSCULAR | Status: DC | PRN
  Administered 2015-05-16 (×3): 25 ug via INTRAVENOUS

## 2015-05-16 MED ORDER — HEPARIN SODIUM (PORCINE) 5000 UNIT/ML IJ SOLN
5000.0000 [IU] | Freq: Once | INTRAMUSCULAR | Status: AC
  Administered 2015-05-16: 5000 [IU] via INTRAVENOUS
  Filled 2015-05-16: qty 1

## 2015-05-16 MED ORDER — ASPIRIN 81 MG PO CHEW
81.0000 mg | CHEWABLE_TABLET | Freq: Every day | ORAL | Status: DC
  Administered 2015-05-17 – 2015-05-19 (×3): 81 mg via ORAL
  Filled 2015-05-16 (×3): qty 1

## 2015-05-16 MED ORDER — HEPARIN SODIUM (PORCINE) 5000 UNIT/ML IJ SOLN: 4000.0000 [IU] | Freq: Once | INTRAMUSCULAR | Status: DC

## 2015-05-16 MED ORDER — BIVALIRUDIN 250 MG IV SOLR
INTRAVENOUS | Status: AC
  Filled 2015-05-16: qty 250

## 2015-05-16 MED ORDER — NITROGLYCERIN 0.4 MG SL SUBL
SUBLINGUAL_TABLET | SUBLINGUAL | Status: AC
  Filled 2015-05-16: qty 1

## 2015-05-16 MED ORDER — CLOPIDOGREL BISULFATE 300 MG PO TABS
ORAL_TABLET | ORAL | Status: AC
  Filled 2015-05-16: qty 1

## 2015-05-16 MED ORDER — MIDAZOLAM HCL 2 MG/2ML IJ SOLN
INTRAMUSCULAR | Status: DC | PRN
  Administered 2015-05-16 (×2): 1 mg via INTRAVENOUS

## 2015-05-16 MED ORDER — VERAPAMIL HCL 2.5 MG/ML IV SOLN
INTRAVENOUS | Status: AC
  Filled 2015-05-16: qty 2

## 2015-05-16 MED ORDER — LIDOCAINE HCL (PF) 1 % IJ SOLN
INTRAMUSCULAR | Status: DC | PRN
  Administered 2015-05-16: 5 mL

## 2015-05-16 MED ORDER — FENTANYL CITRATE (PF) 100 MCG/2ML IJ SOLN
INTRAMUSCULAR | Status: AC
  Filled 2015-05-16: qty 2

## 2015-05-16 MED ORDER — HEPARIN SODIUM (PORCINE) 5000 UNIT/ML IJ SOLN
INTRAMUSCULAR | Status: AC
  Filled 2015-05-16: qty 1

## 2015-05-16 MED ORDER — MIDAZOLAM HCL 2 MG/2ML IJ SOLN
INTRAMUSCULAR | Status: AC
  Filled 2015-05-16: qty 2

## 2015-05-16 MED ORDER — ACETAMINOPHEN 325 MG PO TABS
650.0000 mg | ORAL_TABLET | ORAL | Status: DC | PRN
  Administered 2015-05-17 – 2015-05-18 (×6): 650 mg via ORAL
  Filled 2015-05-16 (×7): qty 2

## 2015-05-16 MED ORDER — NITROGLYCERIN IN D5W 200-5 MCG/ML-% IV SOLN
INTRAVENOUS | Status: DC | PRN
  Administered 2015-05-16: 10 ug/min via INTRAVENOUS

## 2015-05-16 MED ORDER — WARFARIN - PHARMACIST DOSING INPATIENT
Freq: Every day | Status: DC
  Administered 2015-05-20: 18:00:00

## 2015-05-16 MED ORDER — ONDANSETRON HCL 4 MG/2ML IJ SOLN
4.0000 mg | Freq: Four times a day (QID) | INTRAMUSCULAR | Status: DC | PRN
  Administered 2015-05-16: 4 mg via INTRAVENOUS
  Filled 2015-05-16: qty 2

## 2015-05-16 MED ORDER — HEPARIN SODIUM (PORCINE) 1000 UNIT/ML IJ SOLN
INTRAMUSCULAR | Status: AC
  Filled 2015-05-16: qty 1

## 2015-05-16 MED ORDER — SENNA 8.6 MG PO TABS
2.0000 | ORAL_TABLET | Freq: Every day | ORAL | Status: DC
  Administered 2015-05-16 – 2015-05-20 (×4): 17.2 mg via ORAL
  Filled 2015-05-16 (×10): qty 2

## 2015-05-16 MED ORDER — VERAPAMIL HCL 2.5 MG/ML IV SOLN
INTRAVENOUS | Status: DC | PRN
  Administered 2015-05-16: 10 mL via INTRA_ARTERIAL

## 2015-05-16 MED ORDER — TEMAZEPAM 15 MG PO CAPS
15.0000 mg | ORAL_CAPSULE | Freq: Every evening | ORAL | Status: DC | PRN
  Administered 2015-05-17 – 2015-05-22 (×6): 15 mg via ORAL
  Filled 2015-05-16 (×7): qty 1

## 2015-05-16 MED ORDER — METOPROLOL TARTRATE 1 MG/ML IV SOLN
5.0000 mg | Freq: Once | INTRAVENOUS | Status: AC
  Administered 2015-05-16: 5 mg via INTRAVENOUS

## 2015-05-16 MED ORDER — BIVALIRUDIN 250 MG IV SOLR
250.0000 mg | INTRAVENOUS | Status: DC | PRN
  Administered 2015-05-16: 1.75 mg/kg/h via INTRAVENOUS

## 2015-05-16 MED ORDER — PANTOPRAZOLE SODIUM 40 MG PO TBEC
40.0000 mg | DELAYED_RELEASE_TABLET | Freq: Every day | ORAL | Status: DC
  Administered 2015-05-16 – 2015-05-23 (×8): 40 mg via ORAL
  Filled 2015-05-16 (×8): qty 1

## 2015-05-16 MED ORDER — CLOPIDOGREL BISULFATE 75 MG PO TABS
75.0000 mg | ORAL_TABLET | Freq: Every day | ORAL | Status: DC
  Administered 2015-05-17 – 2015-05-19 (×3): 75 mg via ORAL
  Filled 2015-05-16 (×3): qty 1

## 2015-05-16 MED ORDER — SODIUM CHLORIDE 0.9 % WEIGHT BASED INFUSION
3.0000 mL/kg/h | INTRAVENOUS | Status: AC
  Administered 2015-05-16: 3 mL/kg/h via INTRAVENOUS

## 2015-05-16 MED ORDER — LORATADINE 10 MG PO TABS
10.0000 mg | ORAL_TABLET | Freq: Every day | ORAL | Status: DC
  Administered 2015-05-17 – 2015-05-23 (×7): 10 mg via ORAL
  Filled 2015-05-16 (×7): qty 1

## 2015-05-16 MED ORDER — METOPROLOL SUCCINATE ER 25 MG PO TB24
25.0000 mg | ORAL_TABLET | Freq: Every day | ORAL | Status: DC
  Administered 2015-05-16 – 2015-05-19 (×4): 25 mg via ORAL
  Filled 2015-05-16 (×4): qty 1

## 2015-05-16 MED ORDER — SODIUM CHLORIDE 0.9% FLUSH
3.0000 mL | INTRAVENOUS | Status: DC | PRN
  Administered 2015-05-18: 3 mL via INTRAVENOUS
  Filled 2015-05-16: qty 3

## 2015-05-16 MED ORDER — ROSUVASTATIN CALCIUM 20 MG PO TABS
20.0000 mg | ORAL_TABLET | Freq: Every day | ORAL | Status: DC
  Administered 2015-05-17 – 2015-05-18 (×2): 20 mg via ORAL
  Filled 2015-05-16 (×2): qty 1
  Filled 2015-05-16: qty 2

## 2015-05-16 MED ORDER — HEPARIN (PORCINE) IN NACL 2-0.9 UNIT/ML-% IJ SOLN
INTRAMUSCULAR | Status: AC
  Filled 2015-05-16: qty 1000

## 2015-05-16 MED ORDER — BIVALIRUDIN BOLUS VIA INFUSION - CUPID
INTRAVENOUS | Status: DC | PRN
  Administered 2015-05-16: 44.25 mg via INTRAVENOUS

## 2015-05-16 MED ORDER — LIDOCAINE HCL (PF) 1 % IJ SOLN
INTRAMUSCULAR | Status: AC
  Filled 2015-05-16: qty 30

## 2015-05-16 MED ORDER — CLOPIDOGREL BISULFATE 300 MG PO TABS
ORAL_TABLET | ORAL | Status: DC | PRN
  Administered 2015-05-16: 600 mg via ORAL

## 2015-05-16 MED ORDER — ALPRAZOLAM 0.25 MG PO TABS
0.2500 mg | ORAL_TABLET | Freq: Every evening | ORAL | Status: DC | PRN
  Administered 2015-05-16: 0.25 mg via ORAL
  Filled 2015-05-16 (×2): qty 1

## 2015-05-16 MED ORDER — POTASSIUM CHLORIDE CRYS ER 20 MEQ PO TBCR
20.0000 meq | EXTENDED_RELEASE_TABLET | Freq: Every day | ORAL | Status: DC
  Administered 2015-05-16 – 2015-05-23 (×8): 20 meq via ORAL
  Filled 2015-05-16 (×8): qty 1

## 2015-05-16 MED ORDER — SODIUM CHLORIDE 0.9% FLUSH
3.0000 mL | Freq: Two times a day (BID) | INTRAVENOUS | Status: DC
  Administered 2015-05-16 – 2015-05-17 (×2): 3 mL via INTRAVENOUS
  Administered 2015-05-17: 10:00:00 via INTRAVENOUS
  Administered 2015-05-18 – 2015-05-22 (×8): 3 mL via INTRAVENOUS

## 2015-05-16 MED ORDER — WARFARIN SODIUM 2 MG PO TABS: 6.0000 mg | ORAL_TABLET | ORAL | Status: DC

## 2015-05-16 MED ORDER — NITROGLYCERIN 1 MG/10 ML FOR IR/CATH LAB
INTRA_ARTERIAL | Status: AC
  Filled 2015-05-16: qty 10

## 2015-05-16 MED ORDER — HEPARIN SODIUM (PORCINE) 5000 UNIT/ML IJ SOLN: 60.0000 [IU]/kg | Freq: Once | INTRAMUSCULAR | Status: DC

## 2015-05-16 MED ORDER — NITROGLYCERIN 1 MG/10 ML FOR IR/CATH LAB
INTRA_ARTERIAL | Status: DC | PRN
  Administered 2015-05-16 (×3): 200 ug via INTRACORONARY

## 2015-05-16 MED ORDER — METOPROLOL TARTRATE 1 MG/ML IV SOLN
INTRAVENOUS | Status: AC
  Filled 2015-05-16: qty 5

## 2015-05-16 MED ORDER — CLOPIDOGREL BISULFATE 300 MG PO TABS
ORAL_TABLET | ORAL | Status: AC
Start: 2015-05-16 — End: 2015-05-16
  Filled 2015-05-16: qty 1

## 2015-05-16 SURGICAL SUPPLY — 16 items
BALLN EMERGE MR 2.0X12 (BALLOONS) ×3
BALLOON EMERGE MR 2.0X12 (BALLOONS) IMPLANT
CATH INFINITI JR4 5F (CATHETERS) ×1 IMPLANT
CATH VISTA GUIDE 6FR JL3.5 (CATHETERS) ×1 IMPLANT
CATH VISTA GUIDE 6FR XBLAD3.0 (CATHETERS) ×1 IMPLANT
DEVICE RAD COMP TR BAND LRG (VASCULAR PRODUCTS) ×4 IMPLANT
GLIDESHEATH SLEND A-KIT 6F 22G (SHEATH) ×3 IMPLANT
KIT ENCORE 26 ADVANTAGE (KITS) ×1 IMPLANT
KIT HEART LEFT (KITS) ×3 IMPLANT
PACK CARDIAC CATHETERIZATION (CUSTOM PROCEDURE TRAY) ×3 IMPLANT
STENT SYNERGY DES 2.25X20 (Permanent Stent) ×1 IMPLANT
TRANSDUCER W/STOPCOCK (MISCELLANEOUS) ×3 IMPLANT
TUBING CIL FLEX 10 FLL-RA (TUBING) ×3 IMPLANT
WIRE ASAHI PROWATER 180CM (WIRE) ×1 IMPLANT
WIRE HI TORQ WHISPER MS 190CM (WIRE) ×1 IMPLANT
WIRE SAFE-T 1.5MM-J .035X260CM (WIRE) ×1 IMPLANT

## 2015-05-16 NOTE — ED Provider Notes (Signed)
CSN: [id number]     Arrival date & time 05/16/15  1855 History   First MD Initiated Contact with Patient 05/16/15 1905     Chief Complaint  Patient presents with  . Code STEMI     (Consider location/radiation/quality/duration/timing/severity/associated sxs/prior Treatment) HPI 70 year old female who presents as a code STEMI. History of aortic valve replacement on Coumadin, hypertension hyperlipidemia. Was walking her dog this evening and developed chest pain described as a band around her chest wall associated with nausea at about 6:30PM. Took single dose of aspirin and called EMS on their arrival EKG was concerning for anterolateral STEMI. Was given 4 baby aspirins and 3 nitroglycerin tablets. Complains of 5 out of 10 left scapula and left-sided chest pain on arrival.  Past Medical History  Diagnosis Date  . Hyperlipidemia   . Hypertension   . Hx of breast cancer   . GERD (gastroesophageal reflux disease)   . Osteopenia   . Status post aortic valve repair   . FHx: migraine headaches   . Menopausal syndrome   . Aortic valve replaced   . Hiatal hernia   . Hx Breast Cancer, IDC, Stage I 07/03/2003  . 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 08/22/2011   Past Surgical History  Procedure Laterality Date  . Lummpectomy breast cancer  07/03/2003  . Aortic valve replacement  2003  . Hysterectomy, endometiral cancer  2001  . Right shouler surgery    . [employer] fundoplicaiton  [id number]    post-op hematoma on heparin   . Hiatal hernia repair    . Cardiac catheterization      Ejection Fraction    Family History  Problem Relation Age of Onset  . Heart attack Mother 28  . Osteoarthritis Brother     hpercholesterolemia   Social History  Substance Use Topics  . Smoking status: Never Smoker   . Smokeless tobacco: Never Used  . Alcohol Use: 0.0 oz/week    0 Standard drinks or equivalent per week     Comment: socially   OB History    No data available     Review of Systems 10/14  systems reviewed and are negative other than those stated in the HPI    Allergies  Latex; Meperidine hcl; and Sulfamethoxazole  Home Medications   Prior to Admission medications   Medication Sig Start Date End Date Taking? Authorizing Provider  ALPRAZolam (XANAX) 0.25 MG tablet Take 1 tablet (0.25 mg total) by mouth at bedtime as needed for anxiety. 02/12/15   Volanda Napoleon, MD  amoxicillin-clavulanate (AUGMENTIN) 875-125 MG tablet Take 1 tablet by mouth 2 (two) times daily. 04/24/15   Marletta Lor, MD  BIOTIN PO Take 1 tablet by mouth every morning.     Historical Provider, MD  Calcium Carbonate-Vitamin D (CALCIUM 600+D) 600-400 MG-UNIT per tablet Take 1 tablet by mouth 2 (two) times daily.      Historical Provider, MD  chlorthalidone (HYGROTON) 25 MG tablet TAKE 1 TABLET DAILY FOR FOUR DAYS PER WEEK 03/23/15   Marin Olp, MD  cycloSPORINE (RESTASIS) 0.05 % ophthalmic emulsion Place 1 drop into both eyes 2 (two) times daily as needed.     Historical Provider, MD  Desoximetasone 0.05 % GEL Apply to the affected areas twice daily as needed 04/24/15   Marletta Lor, MD  FLUZONE HIGH-DOSE 0.5 ML SUSY Inject as directed once. 12/03/14   Historical Provider, MD  Glucosamine-Chondroit-Vit C-Mn (GLUCOSAMINE CHONDR 1500 COMPLX) CAPS Take by mouth every morning.  Historical Provider, MD  loratadine (CLARITIN) 10 MG tablet Take 10 mg by mouth daily.      Historical Provider, MD  Multiple Vitamin (MULTIVITAMIN) capsule Take 1 capsule by mouth daily.      Historical Provider, MD  omeprazole (PRILOSEC) 20 MG capsule Take by mouth. TAKE 1 CAPSULE THREE DAYS A WEEK 06/01/12   Marletta Lor, MD  potassium chloride SA (KLOR-CON M20) 20 MEQ tablet Take 1 tablet (20 mEq total) by mouth daily. 01/17/14   Thayer Headings, MD  rosuvastatin (CRESTOR) 10 MG tablet TAKE 1 TABLET AT BEDTIME 02/25/15   Marletta Lor, MD  senna (SENOKOT) 8.6 MG tablet Take 2 tablets by mouth daily.       Historical Provider, MD  SUMAtriptan (IMITREX) 25 MG tablet TAKE 1 TABLET DAILY AS NEEDED 06/19/11   Marletta Lor, MD  temazepam (RESTORIL) 30 MG capsule Take 1 capsule (30 mg total) by mouth at bedtime as needed. 02/12/15   Volanda Napoleon, MD  verapamil (CALAN-SR) 180 MG CR tablet TAKE 1 TABLET DAILY Patient taking differently: TAKE 1 TABLET BY MOUTH DAILY 07/21/14   Marletta Lor, MD  warfarin (COUMADIN) 5 MG tablet TAKE AS DIRECTED BY COUMADIN CLINIC 04/07/15   Thayer Headings, MD   There were no vitals taken for this visit. Physical Exam Physical Exam  Nursing note and vitals reviewed. Constitutional: Well developed, well nourished, non-toxic, and in no acute distress Head: Normocephalic and atraumatic.  Mouth/Throat: Oropharynx is clear and moist.  Neck: Normal range of motion. Neck supple.  Cardiovascular: Normal rate and regular rhythm. +2 DP pulses bilaterally  Pulmonary/Chest: Effort normal and breath sounds normal.  Abdominal: Soft. There is no tenderness. There is no rebound and no guarding.  Musculoskeletal: Normal range of motion.  Neurological: Alert, no facial droop, fluent speech, moves all extremities symmetrically Skin: Skin is warm and dry.  Psychiatric: Cooperative  ED Course  Procedures (including critical care time) Labs Review Labs Reviewed  CBC  DIFFERENTIAL  PROTIME-INR  APTT  BASIC METABOLIC PANEL  I-STAT [place], ED  I-STAT CHEM 8, ED    Imaging Review No results found. I have personally reviewed and evaluated these images and lab results as part of my medical decision-making.   EKG Interpretation None     CRITICAL CARE Performed by: Forde Dandy   Total critical care time: 35 minutes  Critical care time was exclusive of separately billable procedures and treating other patients.  Critical care was necessary to treat or prevent imminent or life-threatening deterioration.  Critical care was time spent personally by me on the  following activities: development of treatment plan with patient and/or surrogate as well as nursing, discussions with consultants, evaluation of patient's response to treatment, examination of patient, obtaining history from patient or surrogate, ordering and performing treatments and interventions, ordering and review of laboratory studies, ordering and review of radiographic studies, pulse oximetry and re-evaluation of patient's condition.  MDM   Final diagnoses:  ST elevation myocardial infarction (STEMI), unspecified artery (HCC)  Chest pain, unspecified chest pain type   70 year old female who presents with chest pain. EMS EKG reviewed and concerning for lateral STEMI. On arrival continued to have 5 out of 10 chest pain and given nitroglycerin. Receive full dose of aspirin. Given heparin. Repeat EKG does show some dynamic changes with concerning for anterior ST changes. Cardiology at bedside and accepted patient for catheterization.    Forde Dandy, MD 05/16/15 [phone]

## 2015-05-16 NOTE — H&P (Addendum)
History and Physical Note:   PCP: Nyoka Cowden, MD  CARDIOLOGIST: [place]   NAME:  Amanda Wiggins   MRN: [id number] DOB:  [birth year]   ADMIT DATE: 05/16/2015   05/16/2015 7:15 PM  Amanda Wiggins is a 70 y.o. female with a history of mechanical prosthetic aortic valve in 2003, she is on chronic warfarin therapy for this. She has not had any significant symptoms to suggest any coronary artery disease. She was brought in by [facility] as a code STEMI. Her transmitted EKG showed subtle ST elevations in leads  I and aVL.  She has very low voltage in her limb leads, making the reading of EKG very difficult. Upon arrival to [facility] lateral shoelaces were more difficult to determine, however ST segments in V1 1, V2 and V3 seem to be more elevated than the preliminary EKG. She still having 5 out of 10 chest discomfort. This is down from 810 upon initial evaluation. She notes that the discomfort be left subscapular. This time she was given 4000 units IV heparin and the ER along with once a little nitroglycerin. She is hypertensive. She'll be brought directly to the cardiac catheter lab for urgent cardiac catheterization.   Past Medical History  Diagnosis Date  . Hyperlipidemia   . Hypertension   . Hx of breast cancer   . GERD (gastroesophageal reflux disease)   . Osteopenia   . Status post aortic valve repair   . FHx: migraine headaches   . Menopausal syndrome   . Aortic valve replaced   . Hiatal hernia   . Hx Breast Cancer, IDC, Stage I 07/03/2003  . 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 08/22/2011   Past Surgical History  Procedure Laterality Date  . Lummpectomy breast cancer  07/03/2003  . Aortic valve replacement  2003  . Hysterectomy, endometiral cancer  2001  . Right shouler surgery    . [employer] fundoplicaiton  [id number]    post-op hematoma on heparin   . Hiatal hernia repair    . Cardiac catheterization      Ejection Fraction     FAMHx: Family History  Problem  Relation Age of Onset  . Heart attack Mother 21  . Osteoarthritis Brother     hpercholesterolemia    SOCHx:  reports that she has never smoked. She has never used smokeless tobacco. She reports that she drinks alcohol. She reports that she does not use illicit drugs.  ALLERGIES: Allergies  Allergen Reactions  . Latex Hives  . Meperidine Hcl Nausea And Vomiting  . Sulfamethoxazole Nausea And Vomiting    REACTION: unspecified    HOME MEDICATIONS: Prescriptions prior to admission  Medication Sig Dispense Refill Last Dose  . ALPRAZolam (XANAX) 0.25 MG tablet Take 1 tablet (0.25 mg total) by mouth at bedtime as needed for anxiety. 30 tablet 2 Taking  . amoxicillin-clavulanate (AUGMENTIN) 875-125 MG tablet Take 1 tablet by mouth 2 (two) times daily. 20 tablet 0   . BIOTIN PO Take 1 tablet by mouth every morning.    Taking  . Calcium Carbonate-Vitamin D (CALCIUM 600+D) 600-400 MG-UNIT per tablet Take 1 tablet by mouth 2 (two) times daily.     Taking  . chlorthalidone (HYGROTON) 25 MG tablet TAKE 1 TABLET DAILY FOR FOUR DAYS PER WEEK 90 tablet 2 Taking  . cycloSPORINE (RESTASIS) 0.05 % ophthalmic emulsion Place 1 drop into both eyes 2 (two) times daily as needed.    Taking  . Desoximetasone 0.05 % GEL Apply to  the affected areas twice daily as needed 15 g 6   . FLUZONE HIGH-DOSE 0.5 ML SUSY Inject as directed once.  0 Taking  . Glucosamine-Chondroit-Vit C-Mn (GLUCOSAMINE CHONDR 1500 COMPLX) CAPS Take by mouth every morning.   Taking  . loratadine (CLARITIN) 10 MG tablet Take 10 mg by mouth daily.     Taking  . Multiple Vitamin (MULTIVITAMIN) capsule Take 1 capsule by mouth daily.     Taking  . omeprazole (PRILOSEC) 20 MG capsule Take by mouth. TAKE 1 CAPSULE THREE DAYS A WEEK   Taking  . potassium chloride SA (KLOR-CON M20) 20 MEQ tablet Take 1 tablet (20 mEq total) by mouth daily. 90 tablet 3 Taking  . rosuvastatin (CRESTOR) 10 MG tablet TAKE 1 TABLET AT BEDTIME 90 tablet 1 Taking  .  senna (SENOKOT) 8.6 MG tablet Take 2 tablets by mouth daily.     Taking  . SUMAtriptan (IMITREX) 25 MG tablet TAKE 1 TABLET DAILY AS NEEDED 4 tablet 5 Taking  . temazepam (RESTORIL) 30 MG capsule Take 1 capsule (30 mg total) by mouth at bedtime as needed. 90 capsule 2 Taking  . verapamil (CALAN-SR) 180 MG CR tablet TAKE 1 TABLET DAILY (Patient taking differently: TAKE 1 TABLET BY MOUTH DAILY) 90 tablet 3 Taking  . warfarin (COUMADIN) 5 MG tablet TAKE AS DIRECTED BY COUMADIN CLINIC 90 tablet 1 Taking   Review of Systems  Constitutional: Positive for diaphoresis. Negative for fever, chills, weight loss and malaise/fatigue.  HENT: Negative for congestion and hearing loss.   Eyes: Negative for blurred vision.  Respiratory: Negative for cough, hemoptysis and sputum production.   Cardiovascular: Positive for chest pain and palpitations. Negative for orthopnea, claudication, leg swelling and PND.  Gastrointestinal: Negative for heartburn, nausea, vomiting, abdominal pain, diarrhea and constipation.  Genitourinary: Negative for dysuria, urgency and frequency.  Musculoskeletal: Positive for back pain and joint pain. Negative for myalgias.  Skin: Negative for rash.  Neurological: Negative for dizziness, tingling and headaches.  Endo/Heme/Allergies: Does not bruise/bleed easily.  Psychiatric/Behavioral: Negative for suicidal ideas.     PHYSICAL EXAM:SpO2 99 %. BP: 166/98, HR 112 bpm, afebrile, 98% on 2L/[place] General: in no acute distress Head: atraumatic Neck: no JVD, no thyromegaly Pulm: no crackles, no wheezes Cardiac: tachycardia, regular rhythm, S1, systolic grade II/VI murmur at the aortic area Musculoskeletal: no leg edema Derm: no rashes Abdomen: soft non tender     Adult ECG Report  sinus tachycardia, rate 102. Low voltage in limb leads.  Subtle less than 1 mm ST elevations in I and aVL with T-wave inversion. Very low voltage makes it difficult to read. His also maybe 2 mm elevation in  V2 and 1/2-2 mm in V1 and V3.  Narrative Interpretation: Concerning EKG for anterior and anterolateral ST elevation MI.  IMPRESSION & PLAN The patients' history has been reviewed, patient examined, no change in status from most recent note, stable for surgery. I have reviewed the patients' chart and labs. Questions were answered to the patient's satisfaction.    Amanda Wiggins has presented today for surgery, with the diagnosis opossible anterior-anterolateral STEMIhe various methods of treatment have been discussed with the patient and family.   Risks / Complications include, but not limited to: Death, MI, CVA/TIA, VF/VT (with defibrillation), Bradycardia (need for temporary pacer placement), contrast induced nephropathy, bleeding / bruising / hematoma / pseudoaneurysm, vascular or coronary injury (with possible emergent CT or Vascular Surgery), adverse medication reactions, infection.     After consideration of  risks, benefits and other options for treatment, the patient has consented to Procedure(s):  LEFT HEART CATHETERIZATION AND CORONARY ANGIOGRAPHY +/- AD [place]  Emergency consent implied.    We will proceed with the planned procedure.  If PCI is required, will use Synergy DES which will allow [place] to stop aspirin after one month. We'll use Plavix as opposed to [employer], [address]. [place], [place]  [address]  [phone]  05/16/2015 7:15 PM

## 2015-05-16 NOTE — Progress Notes (Signed)
ANTICOAGULATION CONSULT NOTE - Initial Consult  Pharmacy Consult for Coumadin Indication: mechanical AVR  Allergies  Allergen Reactions  . Latex Hives  . Meperidine Hcl Nausea And Vomiting  . Sulfamethoxazole Nausea And Vomiting    REACTION: unspecified    Patient Measurements: Height: 5\' 1"  (154.9 cm) Weight: 134 lb 9.6 oz (61.054 kg) IBW/kg (Calculated) : 47.8  Vital Signs: Temp: 98 F (36.7 C) (03/11 2100) Temp Source: Oral (03/11 2100) BP: 142/77 mmHg (03/11 2115) Pulse Rate: 99 (03/11 2115)  Labs:  Recent Labs  05/16/15 1911 05/16/15 1924  HGB 13.9 15.3*  HCT 43.1 45.0  PLT 319  --   APTT 34  --   LABPROT 20.9*  --   INR 1.80*  --   CREATININE 0.87 0.80    Estimated Creatinine Clearance: 56.4 mL/min (by C-G formula based on Cr of 0.8).   Medical History: Past Medical History  Diagnosis Date  . Hyperlipidemia   . Hypertension   . Hx of breast cancer   . GERD (gastroesophageal reflux disease)   . Osteopenia   . Status post aortic valve repair   . FHx: migraine headaches   . Menopausal syndrome   . Aortic valve replaced   . Hiatal hernia   . Hx Breast Cancer, IDC, Stage I 07/03/2003  . 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 BIOTIN PO Take 1 tablet by mouth every morning.    05/16/2015 at Unknown time  . Calcium Carbonate-Vitamin D (CALCIUM 600+D) 600-400 MG-UNIT per tablet Take 1 tablet by mouth 2 (two) times daily.     05/16/2015 at Unknown time  . chlorthalidone (HYGROTON) 25 MG tablet TAKE 1 TABLET DAILY FOR FOUR DAYS PER WEEK 90 tablet 2 05/15/2015 at Unknown time  . cycloSPORINE (RESTASIS) 0.05 % ophthalmic emulsion Place 1 drop into both eyes 2 (two) times daily as needed.    05/16/2015 at Unknown time  . Glucosamine-Chondroit-Vit C-Mn (GLUCOSAMINE CHONDR 1500 COMPLX) CAPS Take 1 capsule by mouth every morning.    05/16/2015 at Unknown time  .  loratadine (CLARITIN) 10 MG tablet Take 10 mg by mouth daily.     05/16/2015 at Unknown time  . Multiple Vitamin (MULTIVITAMIN) capsule Take 1 capsule by mouth daily.     05/16/2015 at Unknown time  . omeprazole (PRILOSEC) 20 MG capsule Take by mouth. TAKE 1 CAPSULE THREE DAYS A WEEK   05/16/2015 at Unknown time  . potassium chloride SA (KLOR-CON M20) 20 MEQ tablet Take 1 tablet (20 mEq total) by mouth daily. 90 tablet 3 05/16/2015 at Unknown time  . rosuvastatin (CRESTOR) 10 MG tablet Take 10 mg by mouth daily.   05/16/2015 at Unknown time  . senna (SENOKOT) 8.6 MG tablet Take 2 tablets by mouth daily.     05/15/2015 at Unknown time  . SUMAtriptan (IMITREX) 25 MG tablet TAKE 1 TABLET DAILY AS NEEDED 4 tablet 5 Past Month at Unknown time  . temazepam (RESTORIL) 30 MG capsule Take 1 capsule (30 mg total) by mouth at bedtime as needed. 90 capsule 2 05/15/2015 at Unknown time  . verapamil (CALAN-SR) 180 MG CR tablet TAKE 1 TABLET DAILY 90 tablet 3 05/16/2015 at Unknown time  . warfarin (COUMADIN) 5 MG tablet Take 2.5-5 mg by mouth See admin instructions. Take 1/2 tablet on Monday and Thursday then take 1 tablet all the other days   05/15/2015 at 1800  Assessment: 70 y.o. F presents with STEMI - s/p PCI 3/11. Pt on coumadin PTA for mechanical AVR. Home dose: 2.5mg  on Mon/Thur and 5mg  all other days - last taken 3/10. INR 1.8 on admission - slightly subtherapeutic. CBC ok on admission.  Goal of Therapy:  INR 2-3 Monitor platelets by anticoagulation protocol: Yes   Plan:  Daily INR Coumadin 6mg  po tonight  Sherlon Handing, PharmD, BCPS Clinical pharmacist, pager [phone] 05/16/2015,9:37 PM

## 2015-05-16 NOTE — Progress Notes (Signed)
   05/16/15 1957  Clinical Encounter Type  Visited With Family;Health care provider  Visit Type Initial;Patient in surgery;ED  Referral From Nurse  Spiritual Encounters  Spiritual Needs Prayer;Emotional  Stress Factors  Family Stress Factors Health changes   Chaplain responded to a request from the nurse to visit with the patient's family while the patient is in the Cath Lab. Chaplain met with them and offered support and prayer. Chaplain services available as needed.   Jeri Lager, Chaplain 05/16/2015 7:58 PM

## 2015-05-16 NOTE — Progress Notes (Deleted)
   05/16/15 1957  Clinical Encounter Type  Visited With Family;Health care provider  Visit Type Initial;Patient in surgery;ED  Referral From Nurse  Spiritual Encounters  Spiritual Needs Prayer;Emotional  Stress Factors  Family Stress Factors Health changes   Chaplain responded to a request to visit with the patient's family in the [place] waiting area while the patient is in the Cath Lab. Chaplain met with them, offered support and prayer. Chaplain services available as needed.   Jeri Lager, Chaplain 05/16/2015 8:01 PM

## 2015-05-16 NOTE — ED Notes (Signed)
Pt reported chest "squeezing" today after walking her dog. Pt reported taking 1 Asprin at home. Pt alert and oriented on arrival. Pt reports left chest and shoulder pain. 5/10 pain. 3 nitro en route. And total of 324 mg Asprin.

## 2015-05-17 DIAGNOSIS — I2102 ST elevation (STEMI) myocardial infarction involving left anterior descending coronary artery: Principal | ICD-10-CM

## 2015-05-17 LAB — CBC
HCT: 39.1 % (ref 36.0–46.0)
HEMOGLOBIN: 12.3 g/dL (ref 12.0–15.0)
MCH: 27.4 pg (ref 26.0–34.0)
MCHC: 31.5 g/dL (ref 30.0–36.0)
MCV: 87.1 fL (ref 78.0–100.0)
Platelets: 281 10*3/uL (ref 150–400)
RBC: 4.49 MIL/uL (ref 3.87–5.11)
RDW: 13.6 % (ref 11.5–15.5)
WBC: 11.9 10*3/uL — ABNORMAL HIGH (ref 4.0–10.5)

## 2015-05-17 LAB — BASIC METABOLIC PANEL
ANION GAP: 13 (ref 5–15)
BUN: 14 mg/dL (ref 6–20)
CHLORIDE: 107 mmol/L (ref 101–111)
CO2: 19 mmol/L — AB (ref 22–32)
Calcium: 8.2 mg/dL — ABNORMAL LOW (ref 8.9–10.3)
Creatinine, Ser: 0.79 mg/dL (ref 0.44–1.00)
GFR calc non Af Amer: >60 mL/min (ref >60)
Glucose, Bld: 180 mg/dL — ABNORMAL HIGH (ref 65–99)
POTASSIUM: 3.7 mmol/L (ref 3.5–5.1)
Sodium: 139 mmol/L (ref 135–145)

## 2015-05-17 LAB — POCT I-STAT, CHEM 8
BUN: 22 mg/dL — ABNORMAL HIGH (ref 6–20)
Calcium, Ion: 1.14 mmol/L (ref 1.13–1.30)
Chloride: 106 mmol/L (ref 101–111)
Creatinine, Ser: 0.7 mg/dL (ref 0.44–1.00)
Glucose, Bld: 145 mg/dL — ABNORMAL HIGH (ref 65–99)
HCT: 40 % (ref 36.0–46.0)
Hemoglobin: 13.6 g/dL (ref 12.0–15.0)
Potassium: 3.3 mmol/L — ABNORMAL LOW (ref 3.5–5.1)
Sodium: 142 mmol/L (ref 135–145)
TCO2: 22 mmol/L (ref 0–100)

## 2015-05-17 LAB — MRSA PCR SCREENING: MRSA by PCR: NEGATIVE

## 2015-05-17 LAB — TROPONIN I
Troponin I: 26.52 ng/mL (ref <0.031)
Troponin I: 35.1 ng/mL (ref <0.031)
Troponin I: 24.82 ng/mL (ref <0.031)

## 2015-05-17 LAB — PROTIME-INR
INR: 2.38 — AB (ref 0.00–1.49)
Prothrombin Time: 25.7 seconds — ABNORMAL HIGH (ref 11.6–15.2)

## 2015-05-17 LAB — POCT ACTIVATED CLOTTING TIME: Activated Clotting Time: 600 seconds

## 2015-05-17 MED ORDER — WARFARIN SODIUM 5 MG PO TABS
5.0000 mg | ORAL_TABLET | Freq: Once | ORAL | Status: AC
Start: 2015-05-17 — End: 2015-05-17
  Administered 2015-05-17: 5 mg via ORAL
  Filled 2015-05-17: qty 1

## 2015-05-17 NOTE — Progress Notes (Signed)
Lab in to draw blood patient states that it was a painful stick. Large knot noted ice applied.

## 2015-05-17 NOTE — Progress Notes (Signed)
PROGRESS NOTE  Subjective:   70 yo  Hx of mechanical AVR Breast cancer,  Admitted with anterior STEMI last night.  Cath revealed a mid LAD occlusion - stented with a 2.25 x 20 DES   Objective:    Vital Signs:   Temp:  [98 F (36.7 C)-98.9 F (37.2 C)] 98 F (36.7 C) (03/12 0821) Pulse Rate:  [79-192] 82 (03/12 0821) Resp:  [0-44] 17 (03/12 0821) BP: (103-166)/(67-100) 123/68 mmHg (03/12 0821) SpO2:  [0 %-100 %] 98 % (03/12 0821) Weight:  [134 lb 9.6 oz (61.054 kg)] 134 lb 9.6 oz (61.054 kg) (03/11 2100)      24-hour weight change: Weight change:   Weight trends: Filed Weights   05/16/15 2100  Weight: 134 lb 9.6 oz (61.054 kg)    Intake/Output:  03/11 0701 - 03/12 0700 In: 1246.5 [P.O.:240; I.V.:1006.5] Out: 750 [Urine:750] Total I/O In: -  Out: 150 [Urine:150]   Physical Exam: BP 123/68 mmHg  Pulse 82  Temp(Src) 98 F (36.7 C) (Oral)  Resp 17  Ht 5\' 1"  (1.549 m)  Wt 134 lb 9.6 oz (61.054 kg)  BMI 25.45 kg/m2  SpO2 98%  Wt Readings from Last 3 Encounters:  05/16/15 134 lb 9.6 oz (61.054 kg)  04/24/15 131 lb (59.421 kg)  02/12/15 131 lb (59.421 kg)    General: Vital signs reviewed and noted.   Head: Normocephalic, atraumatic.  Eyes: conjunctivae/corneas clear.  EOM's intact.   Throat: normal  Neck:  normal   Lungs:    clear   Heart:  RR , S1   Mechanical jS2   Abdomen:  Soft, non-tender, non-distended    Extremities: Right radial cath site is swollen,  peticial rash.   bruised   Neurologic: A&O X3, CN II - XII are grossly intact.   Psych: Normal    Labs: BMET:  Recent Labs  05/16/15 1911 05/16/15 1924 05/17/15 0304  NA 141 142 139  K 4.1 3.8 3.7  CL 106 105 107  CO2 23  --  19*  GLUCOSE 130* 126* 180*  BUN 21* 26* 14  CREATININE 0.87 0.80 0.79  CALCIUM 9.1  --  8.2*    Liver function tests: No results for input(s): AST, ALT, ALKPHOS, BILITOT, PROT, ALBUMIN in the last 72 hours. No results for input(s): LIPASE,  AMYLASE in the last 72 hours.  CBC:  Recent Labs  05/16/15 1911 05/16/15 1924 05/17/15 0304  WBC 9.9  --  11.9*  NEUTROABS 7.1  --   --   HGB 13.9 15.3* 12.3  HCT 43.1 45.0 39.1  MCV 86.2  --  87.1  PLT 319  --  281    Cardiac Enzymes:  Recent Labs  05/16/15 2147 05/17/15 0304  TROPONINI 51.54* 26.52*    Coagulation Studies:  Recent Labs  05/16/15 1911 05/17/15 0304  LABPROT 20.9* 25.7*  INR 1.80* 2.38*    Other: Invalid input(s): POCBNP No results for input(s): DDIMER in the last 72 hours. No results for input(s): HGBA1C in the last 72 hours. No results for input(s): CHOL, HDL, LDLCALC, TRIG, CHOLHDL in the last 72 hours. No results for input(s): TSH, T4TOTAL, T3FREE, THYROIDAB in the last 72 hours.  Invalid input(s): FREET3 No results for input(s): VITAMINB12, FOLATE, FERRITIN, TIBC, IRON, RETICCTPCT in the last 72 hours.   Other results:  EKG  ( personally reviewed )  - NSR , ST elevation V2-V5    Medications:    Infusions:  Scheduled Medications: . aspirin  81 mg Oral Daily  . clopidogrel  75 mg Oral Q breakfast  . cycloSPORINE  1 drop Both Eyes BID  . loratadine  10 mg Oral Daily  . metoprolol succinate  25 mg Oral Daily  . pantoprazole  40 mg Oral Daily  . potassium chloride SA  20 mEq Oral Daily  . rosuvastatin  20 mg Oral QHS  . senna  2 tablet Oral Daily  . sodium chloride flush  3 mL Intravenous Q12H  . warfarin  6 mg Oral NOW  . Warfarin - Pharmacist Dosing Inpatient   Does not apply q1800    Assessment/ Plan:   Principal Problem:   Acute ST elevation myocardial infarction (STEMI) of anterolateral wall (HCC) Active Problems:   H/O aortic valve replacement   Long term current use of anticoagulant therapy   ST elevation (STEMI) myocardial infarction involving left anterior descending coronary artery ([place])  1.  [place] STEMI,   S/p stenting of LAD Doing well  Right wrist has some bleeding .   Needs to keep her hand  still Some bruising from the TR band INR is 2.38 Coumadin , plavix , ASA for 1 month and then plan on stopping the ASA at that time .   Should remain on coumadin and plavix for a year .    2. AVR - stable , INR is 2.38.   Will need to be on coumadin ,     Disposition: will keep in step down today for close observation of her right wrist.  Length of Stay: 1  Thayer Headings, Brooke Bonito., MD, [facility] 05/17/2015, 10:06 AM Office [phone] Pager [phone]

## 2015-05-17 NOTE — Progress Notes (Signed)
Utilization review completed.  

## 2015-05-17 NOTE — Progress Notes (Signed)
ANTICOAGULATION CONSULT NOTE - Follow-up Consult  Pharmacy Consult for Coumadin Indication: mechanical AVR  Allergies  Allergen Reactions  . Latex Hives  . Meperidine Hcl Nausea And Vomiting  . Sulfamethoxazole Nausea And Vomiting    REACTION: unspecified    Patient Measurements: Height: 5\' 1"  (154.9 cm) Weight: 134 lb 9.6 oz (61.054 kg) IBW/kg (Calculated) : 47.8  Vital Signs: Temp: 98 F (36.7 C) (03/12 0821) Temp Source: Oral (03/12 0821) BP: 123/68 mmHg (03/12 0821) Pulse Rate: 82 (03/12 0821)  Labs:  Recent Labs  05/16/15 1911 05/16/15 1924 05/16/15 2147 05/17/15 0304  HGB 13.9 15.3*  --  12.3  HCT 43.1 45.0  --  39.1  PLT 319  --   --  281  APTT 34  --   --   --   LABPROT 20.9*  --   --  25.7*  INR 1.80*  --   --  2.38*  CREATININE 0.87 0.80  --  0.79  TROPONINI  --   --  51.54* 26.52*    Estimated Creatinine Clearance: 56.4 mL/min (by C-G formula based on Cr of 0.79).  Assessment: 70 y.o. F presents with STEMI - s/p PCI 3/11. Pt on coumadin PTA for mechanical AVR- last taken 3/10. INR 1.8 on admission - slightly subtherapeutic. No warfarin dose given yesterday. Patient received angiomax yesterday, ending at ~2100. INR may be falsely elevated today d/t angiomax.   INR 2.38, Hgb 12.3, plt wnl  PTA dosing: Home dose: 2.5mg  on Mon/Thur and 5mg  all other days   Goal of Therapy:  INR 2-3 Monitor platelets by anticoagulation protocol: Yes   Plan:  Coumadin 5mg  x1 this evening Daily INR   Darl Pikes, PharmD Clinical Pharmacist- Resident Pager: [phone]   05/17/2015,11:12 AM

## 2015-05-18 ENCOUNTER — Encounter (HOSPITAL_COMMUNITY): Payer: Self-pay | Admitting: Cardiology

## 2015-05-18 ENCOUNTER — Inpatient Hospital Stay (HOSPITAL_COMMUNITY): Payer: Medicare Other

## 2015-05-18 ENCOUNTER — Ambulatory Visit: Payer: Medicare Other

## 2015-05-18 DIAGNOSIS — I350 Nonrheumatic aortic (valve) stenosis: Secondary | ICD-10-CM

## 2015-05-18 LAB — CBC
HCT: 40.2 % (ref 36.0–46.0)
Hemoglobin: 12.7 g/dL (ref 12.0–15.0)
MCH: 27.5 pg (ref 26.0–34.0)
MCHC: 31.6 g/dL (ref 30.0–36.0)
MCV: 87.2 fL (ref 78.0–100.0)
PLATELETS: 274 10*3/uL (ref 150–400)
RBC: 4.61 MIL/uL (ref 3.87–5.11)
RDW: 13.9 % (ref 11.5–15.5)
WBC: 9.6 10*3/uL (ref 4.0–10.5)

## 2015-05-18 LAB — ECHOCARDIOGRAM COMPLETE
HEIGHTINCHES: 61 in
WEIGHTICAEL: 2153.6 [oz_av]

## 2015-05-18 LAB — PROTIME-INR
INR: 2.32 — AB (ref 0.00–1.49)
PROTHROMBIN TIME: 25.2 s — AB (ref 11.6–15.2)

## 2015-05-18 MED ORDER — SALINE SPRAY 0.65 % NA SOLN
1.0000 | NASAL | Status: DC | PRN
  Filled 2015-05-18: qty 44

## 2015-05-18 MED ORDER — WARFARIN SODIUM 2.5 MG PO TABS
2.5000 mg | ORAL_TABLET | Freq: Once | ORAL | Status: AC
  Administered 2015-05-18: 2.5 mg via ORAL
  Filled 2015-05-18: qty 1

## 2015-05-18 MED FILL — Heparin Sodium (Porcine) Inj 1000 Unit/ML: INTRAMUSCULAR | Qty: 10 | Status: AC

## 2015-05-18 MED FILL — Heparin Sodium (Porcine) 2 Unit/ML in Sodium Chloride 0.9%: INTRAMUSCULAR | Qty: 500 | Status: AC

## 2015-05-18 NOTE — Progress Notes (Signed)
  Echocardiogram 2D Echocardiogram has been performed.  Jennette Dubin 05/18/2015, 10:39 AM

## 2015-05-18 NOTE — Progress Notes (Signed)
CARDIAC REHAB PHASE I   PRE:  Rate/Rhythm: 89 SR    BP: sitting 107/76    SaO2: 100 RA  MODE:  Ambulation: 350 ft   POST:  Rate/Rhythm: 118 ST mostly, but bout of 126 ST toward end    BP: sitting 94/60     SaO2: 99 RA  Pt had not been up all weekend except to recliner. Eager to walk however HR up quickly to 118 ST with standing. Asymptomatic. No c/o walking. To recliner. Ed completed with good reception. Pt is anxious regarding her diagnosis and taking so many blood thinners. Will send referral to [place]. [id number]  [place], ACSM 05/18/2015 9:46 AM

## 2015-05-18 NOTE — Progress Notes (Signed)
PROGRESS NOTE  Subjective:   70 yo  Hx of mechanical AVR Breast cancer,  Admitted with anterior STEMI last night.  Cath revealed a mid LAD occlusion - stented with a 2.25 x 20 DES .  Arm is much better . No cp  Objective:    Vital Signs:   Temp:  [97.5 F (36.4 C)-98.9 F (37.2 C)] 98.9 F (37.2 C) (03/13 0312) Pulse Rate:  [79-108] 93 (03/13 0313) Resp:  [14-18] 14 (03/13 0313) BP: (92-126)/(58-74) 118/74 mmHg (03/13 0313) SpO2:  [95 %-98 %] 98 % (03/13 0313)  Last BM Date: 05/16/15   24-hour weight change: Weight change:   Weight trends: Filed Weights   05/16/15 2100  Weight: 134 lb 9.6 oz (61.054 kg)    Intake/Output:  03/12 0701 - 03/13 0700 In: 720 [P.O.:720] Out: 1000 [Urine:1000]     Physical Exam: BP 118/74 mmHg  Pulse 93  Temp(Src) 98.9 F (37.2 C) (Oral)  Resp 14  Ht 5\' 1"  (1.549 m)  Wt 134 lb 9.6 oz (61.054 kg)  BMI 25.45 kg/m2  SpO2 98%  Wt Readings from Last 3 Encounters:  05/16/15 134 lb 9.6 oz (61.054 kg)  04/24/15 131 lb (59.421 kg)  02/12/15 131 lb (59.421 kg)    General: Vital signs reviewed and noted.   Head: Normocephalic, atraumatic.  Eyes: conjunctivae/corneas clear.  EOM's intact.   Throat: normal  Neck:  normal   Lungs:    clear   Heart:  RR , S1   Mechanical jS2   Abdomen:  Soft, non-tender, non-distended    Extremities: Right radial cath site is better ,  peticial rash.   bruised   Neurologic: A&O X3, CN II - XII are grossly intact.   Psych: Normal    Labs: BMET:  Recent Labs  05/16/15 1911  05/16/15 1932 05/17/15 0304  NA 141  < > 142 139  K 4.1  < > 3.3* 3.7  CL 106  < > 106 107  CO2 23  --   --  19*  GLUCOSE 130*  < > 145* 180*  BUN 21*  < > 22* 14  CREATININE 0.87  < > 0.70 0.79  CALCIUM 9.1  --   --  8.2*  < > = values in this interval not displayed.  Liver function tests: No results for input(s): AST, ALT, ALKPHOS, BILITOT, PROT, ALBUMIN in the last 72 hours. No results for input(s):  LIPASE, AMYLASE in the last 72 hours.  CBC:  Recent Labs  05/16/15 1911  05/17/15 0304 05/18/15 0335  WBC 9.9  --  11.9* 9.6  NEUTROABS 7.1  --   --   --   HGB 13.9  < > 12.3 12.7  HCT 43.1  < > 39.1 40.2  MCV 86.2  --  87.1 87.2  PLT 319  --  281 274  < > = values in this interval not displayed.  Cardiac Enzymes:  Recent Labs  05/16/15 2147 05/17/15 0304 05/17/15 0955 05/17/15 1445  TROPONINI 51.54* 26.52* 35.10* 24.82*    Coagulation Studies:  Recent Labs  05/16/15 1911 05/17/15 0304 05/18/15 0509  LABPROT 20.9* 25.7* 25.2*  INR 1.80* 2.38* 2.32*    Other: Invalid input(s): POCBNP No results for input(s): DDIMER in the last 72 hours. No results for input(s): HGBA1C in the last 72 hours. No results for input(s): CHOL, HDL, LDLCALC, TRIG, CHOLHDL in the last 72 hours. No results for input(s): TSH, T4TOTAL, T3FREE, THYROIDAB  in the last 72 hours.  Invalid input(s): FREET3 No results for input(s): VITAMINB12, FOLATE, FERRITIN, TIBC, IRON, RETICCTPCT in the last 72 hours.   Other results:  EKG  ( personally reviewed )  - NSR , ST elevation V2-V5    Medications:    Infusions:    Scheduled Medications: . aspirin  81 mg Oral Daily  . clopidogrel  75 mg Oral Q breakfast  . cycloSPORINE  1 drop Both Eyes BID  . loratadine  10 mg Oral Daily  . metoprolol succinate  25 mg Oral Daily  . pantoprazole  40 mg Oral Daily  . potassium chloride SA  20 mEq Oral Daily  . rosuvastatin  20 mg Oral QHS  . senna  2 tablet Oral Daily  . sodium chloride flush  3 mL Intravenous Q12H  . Warfarin - Pharmacist Dosing Inpatient   Does not apply q1800    Assessment/ Plan:   Principal Problem:   Acute ST elevation myocardial infarction (STEMI) of anterolateral wall (HCC) Active Problems:   H/O aortic valve replacement   Long term current use of anticoagulant therapy   ST elevation (STEMI) myocardial infarction involving left anterior descending coronary artery  ([place])  1.  Ant. Wall STEMI,   S/p stenting of LAD Doing well  Right wrist has some bleeding .   Needs to keep her hand still Some bruising from the TR band INR is 2.38 Coumadin , plavix , ASA for 1 month and then plan on stopping the ASA at that time .   Should remain on coumadin and plavix for a year .  Echo today   2. AVR - stable , INR is 2.32.   Will need to be on coumadin ,     Disposition: will transfer to tele.   Home tomorrow   Length of Stay: 2  Thayer Headings, Brooke Bonito., MD, [facility] 05/18/2015, 8:04 AM Office [phone] Pager [phone]

## 2015-05-18 NOTE — Progress Notes (Signed)
Order for transfer acknowledged. Pr informed and verbalized understanding. Report called to 3W RN. Pt's belongings packed. Elink and CCMD both notified. Pt transported to new room with belongings via wheel chair.

## 2015-05-18 NOTE — Progress Notes (Signed)
ANTICOAGULATION CONSULT NOTE - Follow-up Consult  Pharmacy Consult for Coumadin Indication: mechanical AVR  Allergies  Allergen Reactions  . Latex Hives  . Meperidine Hcl Nausea And Vomiting  . Sulfamethoxazole Nausea And Vomiting    REACTION: unspecified    Patient Measurements: Height: 5\' 1"  (154.9 cm) Weight: 134 lb 9.6 oz (61.054 kg) IBW/kg (Calculated) : 47.8  Vital Signs: Temp: 97.9 F (36.6 C) (03/13 0803) Temp Source: Oral (03/13 0803) BP: 94/60 mmHg (03/13 0948) Pulse Rate: 90 (03/13 1000)  Labs:  Recent Labs  05/16/15 1911 05/16/15 1924 05/16/15 1932  05/17/15 0304 05/17/15 0955 05/17/15 1445 05/18/15 0335 05/18/15 0509  HGB 13.9 15.3* 13.6  --  12.3  --   --  12.7  --   HCT 43.1 45.0 40.0  --  39.1  --   --  40.2  --   PLT 319  --   --   --  281  --   --  274  --   APTT 34  --   --   --   --   --   --   --   --   LABPROT 20.9*  --   --   --  25.7*  --   --   --  25.2*  INR 1.80*  --   --   --  2.38*  --   --   --  2.32*  CREATININE 0.87 0.80 0.70  --  0.79  --   --   --   --   TROPONINI  --   --   --   < > 26.52* 35.10* 24.82*  --   --   < > = values in this interval not displayed.  Estimated Creatinine Clearance: 56.4 mL/min (by C-G formula based on Cr of 0.79).  Assessment: 70 y.o. F presents with STEMI. Pt on coumadin PTA for mechanical AVR- last taken 3/10. INR 1.8 on admission. She is noted s/p PCI w/ stent to LAD and plans coumadin/ASA/plavix for 1 month then coumadin/plavix only. -INR= 2.32 and at goal  PTA dosing: Home dose: 2.5mg  on Mon/Thur and 5mg  all other days   Goal of Therapy:  INR 2-3 Monitor platelets by anticoagulation protocol: Yes   Plan:  Coumadin 2.5mg  x1 this evening Daily INR  Hildred Laser, Pharm D 05/18/2015 11:33 AM

## 2015-05-19 ENCOUNTER — Telehealth: Payer: Self-pay | Admitting: Physician Assistant

## 2015-05-19 ENCOUNTER — Encounter (HOSPITAL_COMMUNITY)
Admission: EM | Disposition: A | Discharge status: Home / Self Care | Payer: Self-pay | Source: Home / Self Care | Attending: Cardiology

## 2015-05-19 ENCOUNTER — Encounter (HOSPITAL_COMMUNITY): Payer: Self-pay | Admitting: Cardiovascular Disease

## 2015-05-19 DIAGNOSIS — I2102 ST elevation (STEMI) myocardial infarction involving left anterior descending coronary artery: Secondary | ICD-10-CM | POA: Insufficient documentation

## 2015-05-19 DIAGNOSIS — Z9861 Coronary angioplasty status: Secondary | ICD-10-CM

## 2015-05-19 DIAGNOSIS — I220 Subsequent ST elevation (STEMI) myocardial infarction of anterior wall: Secondary | ICD-10-CM

## 2015-05-19 DIAGNOSIS — I255 Ischemic cardiomyopathy: Secondary | ICD-10-CM | POA: Diagnosis present

## 2015-05-19 DIAGNOSIS — I2510 Atherosclerotic heart disease of native coronary artery without angina pectoris: Secondary | ICD-10-CM

## 2015-05-19 HISTORY — PX: CARDIAC CATHETERIZATION: SHX172

## 2015-05-19 LAB — CBC
HEMATOCRIT: 41.9 % (ref 36.0–46.0)
HEMOGLOBIN: 13.4 g/dL (ref 12.0–15.0)
MCH: 27.9 pg (ref 26.0–34.0)
MCHC: 32 g/dL (ref 30.0–36.0)
MCV: 87.1 fL (ref 78.0–100.0)
Platelets: 274 10*3/uL (ref 150–400)
RBC: 4.81 MIL/uL (ref 3.87–5.11)
RDW: 13.5 % (ref 11.5–15.5)
WBC: 10 10*3/uL (ref 4.0–10.5)

## 2015-05-19 LAB — PROTIME-INR
INR: 2.1 — AB (ref 0.00–1.49)
PROTHROMBIN TIME: 23.4 s — AB (ref 11.6–15.2)

## 2015-05-19 LAB — POCT ACTIVATED CLOTTING TIME: ACTIVATED CLOTTING TIME: 513 s

## 2015-05-19 SURGERY — LEFT HEART CATH AND CORONARY ANGIOGRAPHY

## 2015-05-19 MED ORDER — SODIUM CHLORIDE 0.9 % IV SOLN
INTRAVENOUS | Status: DC | PRN
  Administered 2015-05-19: 125 mL via INTRAVENOUS

## 2015-05-19 MED ORDER — LIDOCAINE HCL (PF) 1 % IJ SOLN
INTRAMUSCULAR | Status: DC | PRN
  Administered 2015-05-19: 20 mL

## 2015-05-19 MED ORDER — NITROGLYCERIN 1 MG/10 ML FOR IR/CATH LAB
INTRA_ARTERIAL | Status: AC
  Filled 2015-05-19: qty 10

## 2015-05-19 MED ORDER — ASPIRIN 81 MG PO CHEW: 81.0000 mg | CHEWABLE_TABLET | Freq: Every day | ORAL | Status: AC

## 2015-05-19 MED ORDER — SODIUM CHLORIDE 0.9 % IV SOLN: 250.0000 mL | INTRAVENOUS | Status: DC | PRN

## 2015-05-19 MED ORDER — MIDAZOLAM HCL 2 MG/2ML IJ SOLN
INTRAMUSCULAR | Status: DC | PRN
  Administered 2015-05-19: 1 mg via INTRAVENOUS
  Administered 2015-05-19: 2 mg via INTRAVENOUS

## 2015-05-19 MED ORDER — ATROPINE SULFATE 0.1 MG/ML IJ SOLN
INTRAMUSCULAR | Status: AC
  Filled 2015-05-19: qty 10

## 2015-05-19 MED ORDER — ASPIRIN 81 MG PO CHEW
81.0000 mg | CHEWABLE_TABLET | Freq: Every day | ORAL | Status: DC
  Administered 2015-05-20 – 2015-05-23 (×4): 81 mg via ORAL
  Filled 2015-05-19 (×4): qty 1

## 2015-05-19 MED ORDER — METOPROLOL SUCCINATE ER 25 MG PO TB24: 25.0000 mg | ORAL_TABLET | Freq: Every day | ORAL | Status: DC

## 2015-05-19 MED ORDER — NITROGLYCERIN IN D5W 200-5 MCG/ML-% IV SOLN
0.0000 ug/min | INTRAVENOUS | Status: DC
  Administered 2015-05-19: 20 ug/min via INTRAVENOUS

## 2015-05-19 MED ORDER — SODIUM CHLORIDE 0.9 % IV SOLN
250.0000 mg | INTRAVENOUS | Status: DC | PRN
  Administered 2015-05-19: 1.75 mg/kg/h via INTRAVENOUS

## 2015-05-19 MED ORDER — ATORVASTATIN CALCIUM 80 MG PO TABS
80.0000 mg | ORAL_TABLET | Freq: Every day | ORAL | Status: DC
  Administered 2015-05-19: 80 mg via ORAL
  Filled 2015-05-19: qty 1

## 2015-05-19 MED ORDER — NITROGLYCERIN 0.4 MG SL SUBL
SUBLINGUAL_TABLET | SUBLINGUAL | Status: AC
  Filled 2015-05-19: qty 1

## 2015-05-19 MED ORDER — MIDAZOLAM HCL 2 MG/2ML IJ SOLN
INTRAMUSCULAR | Status: AC
  Filled 2015-05-19: qty 2

## 2015-05-19 MED ORDER — ACETAMINOPHEN 325 MG PO TABS
650.0000 mg | ORAL_TABLET | ORAL | Status: DC | PRN
  Administered 2015-05-20 (×2): 650 mg via ORAL
  Filled 2015-05-19 (×3): qty 2

## 2015-05-19 MED ORDER — ROSUVASTATIN CALCIUM 20 MG PO TABS: 20.0000 mg | ORAL_TABLET | Freq: Every day | ORAL | Status: DC

## 2015-05-19 MED ORDER — PANTOPRAZOLE SODIUM 40 MG PO TBEC: 40.0000 mg | DELAYED_RELEASE_TABLET | Freq: Every day | ORAL | Status: DC

## 2015-05-19 MED ORDER — SODIUM CHLORIDE 0.9 % IV SOLN
INTRAVENOUS | Status: AC
  Administered 2015-05-19: 15:00:00 via INTRAVENOUS

## 2015-05-19 MED ORDER — CLOPIDOGREL BISULFATE 75 MG PO TABS
ORAL_TABLET | ORAL | Status: AC
  Filled 2015-05-19: qty 1

## 2015-05-19 MED ORDER — LOSARTAN POTASSIUM 25 MG PO TABS
25.0000 mg | ORAL_TABLET | Freq: Every day | ORAL | Status: DC
  Administered 2015-05-19 – 2015-05-21 (×3): 25 mg via ORAL
  Filled 2015-05-19 (×4): qty 1

## 2015-05-19 MED ORDER — CLOPIDOGREL BISULFATE 75 MG PO TABS: 75.0000 mg | ORAL_TABLET | Freq: Every day | ORAL | Status: DC

## 2015-05-19 MED ORDER — SODIUM CHLORIDE 0.9% FLUSH: 3.0000 mL | INTRAVENOUS | Status: DC | PRN

## 2015-05-19 MED ORDER — HEPARIN (PORCINE) IN NACL 2-0.9 UNIT/ML-% IJ SOLN
INTRAMUSCULAR | Status: DC | PRN
  Administered 2015-05-19: 1500 mL

## 2015-05-19 MED ORDER — NITROGLYCERIN IN D5W 200-5 MCG/ML-% IV SOLN
INTRAVENOUS | Status: DC | PRN
  Administered 2015-05-19: 10 ug/min via INTRAVENOUS

## 2015-05-19 MED ORDER — BIVALIRUDIN 250 MG IV SOLR
INTRAVENOUS | Status: AC
  Filled 2015-05-19: qty 250

## 2015-05-19 MED ORDER — NITROGLYCERIN 1 MG/10 ML FOR IR/CATH LAB
INTRA_ARTERIAL | Status: DC | PRN
Start: 2015-05-19 — End: 2015-05-19
  Administered 2015-05-19: 200 ug via INTRACORONARY

## 2015-05-19 MED ORDER — FENTANYL CITRATE (PF) 100 MCG/2ML IJ SOLN
INTRAMUSCULAR | Status: DC | PRN
  Administered 2015-05-19 (×2): 25 ug via INTRAVENOUS

## 2015-05-19 MED ORDER — IOHEXOL 350 MG/ML SOLN
INTRAVENOUS | Status: DC | PRN
  Administered 2015-05-19: 150 mL via INTRA_ARTERIAL

## 2015-05-19 MED ORDER — BIVALIRUDIN BOLUS VIA INFUSION - CUPID
INTRAVENOUS | Status: DC | PRN
  Administered 2015-05-19: 43.875 mg via INTRAVENOUS

## 2015-05-19 MED ORDER — VERAPAMIL HCL 2.5 MG/ML IV SOLN
INTRAVENOUS | Status: AC
  Filled 2015-05-19: qty 2

## 2015-05-19 MED ORDER — CLOPIDOGREL BISULFATE 75 MG PO TABS
ORAL_TABLET | ORAL | Status: DC | PRN
  Administered 2015-05-19: 75 mg via ORAL

## 2015-05-19 MED ORDER — NITROGLYCERIN 0.4 MG SL SUBL: 0.4000 mg | SUBLINGUAL_TABLET | SUBLINGUAL | Status: DC | PRN

## 2015-05-19 MED ORDER — LIDOCAINE HCL (PF) 1 % IJ SOLN
INTRAMUSCULAR | Status: AC
  Filled 2015-05-19: qty 30

## 2015-05-19 MED ORDER — VERAPAMIL HCL 2.5 MG/ML IV SOLN
INTRAVENOUS | Status: DC | PRN
  Administered 2015-05-19: 100 ug via INTRACORONARY
  Administered 2015-05-19 (×2): 200 ug via INTRACORONARY
  Administered 2015-05-19: 100 ug via INTRACORONARY

## 2015-05-19 MED ORDER — WARFARIN SODIUM 5 MG PO TABS: 5.0000 mg | ORAL_TABLET | Freq: Once | ORAL | Status: DC

## 2015-05-19 MED ORDER — NITROGLYCERIN IN D5W 200-5 MCG/ML-% IV SOLN
INTRAVENOUS | Status: AC
  Filled 2015-05-19: qty 250

## 2015-05-19 MED ORDER — FENTANYL CITRATE (PF) 100 MCG/2ML IJ SOLN
INTRAMUSCULAR | Status: AC
  Filled 2015-05-19: qty 2

## 2015-05-19 MED ORDER — ONDANSETRON HCL 4 MG/2ML IJ SOLN
4.0000 mg | Freq: Four times a day (QID) | INTRAMUSCULAR | Status: DC | PRN
Start: 2015-05-19 — End: 2015-05-23

## 2015-05-19 MED ORDER — LOSARTAN POTASSIUM 25 MG PO TABS: 25.0000 mg | ORAL_TABLET | Freq: Every day | ORAL | Status: DC

## 2015-05-19 MED ORDER — SODIUM CHLORIDE 0.9% FLUSH: 3.0000 mL | Freq: Two times a day (BID) | INTRAVENOUS | Status: DC

## 2015-05-19 MED ORDER — CLOPIDOGREL BISULFATE 75 MG PO TABS
75.0000 mg | ORAL_TABLET | Freq: Every day | ORAL | Status: DC
  Administered 2015-05-20 – 2015-05-23 (×4): 75 mg via ORAL
  Filled 2015-05-19 (×4): qty 1

## 2015-05-19 MED ORDER — HEPARIN (PORCINE) IN NACL 2-0.9 UNIT/ML-% IJ SOLN
INTRAMUSCULAR | Status: AC
  Filled 2015-05-19: qty 1500

## 2015-05-19 SURGICAL SUPPLY — 15 items
BALLN EMERGE MR PUSH 1.5X20 (BALLOONS) ×2
BALLOON EMERGE MR PUSH 1.5X20 (BALLOONS) IMPLANT
CATH INFINITI 5FR MULTPACK ANG (CATHETERS) ×1 IMPLANT
CATH VISTA GUIDE 6FR XBLAD3.5 (CATHETERS) ×1 IMPLANT
CATH VISTA GUIDE 6FR XBLAD4 (CATHETERS) ×1 IMPLANT
ELECT DEFIB PAD ADLT CADENCE (PAD) ×1 IMPLANT
KIT ENCORE 26 ADVANTAGE (KITS) ×1 IMPLANT
KIT HEART LEFT (KITS) ×2 IMPLANT
PACK CARDIAC CATHETERIZATION (CUSTOM PROCEDURE TRAY) ×2 IMPLANT
SHEATH PINNACLE 6F 10CM (SHEATH) ×1 IMPLANT
STENT MINI VISION RX 2.0X23 (Permanent Stent) ×1 IMPLANT
TRANSDUCER W/STOPCOCK (MISCELLANEOUS) ×2 IMPLANT
TUBING CIL FLEX 10 FLL-RA (TUBING) ×2 IMPLANT
WIRE COUGAR XT STRL 190CM (WIRE) ×1 IMPLANT
WIRE EMERALD 3MM-J .035X150CM (WIRE) ×1 IMPLANT

## 2015-05-19 NOTE — Progress Notes (Signed)
6 French sheath removed from right femoral artery while using sterile technique. . Manual pressure held for 27 minutes. A small hematoma formed within the first 5 minutes of sheath pull. It resolved after 5 minutes of direct pressure. Site cleaned with alcohol and clean dressing applied. VS remained stable during procedure. Pt has no complaints at this time.  WIll continue to monitor site closely.   Loni Muse, RN

## 2015-05-19 NOTE — Care Management Note (Signed)
Case Management Note  Patient Details  Name: Amanda Wiggins MRN: [id number] Date of Birth: February 10, 1946  Subjective/Objective:   Pt admitted for [facility]- plan for d/c home 05-19-15 on Plavix.                  Action/Plan: No needs identified by CM at this time.   Expected Discharge Date:                  Expected Discharge Plan:  Home/Self Care  In-House Referral:  NA  Discharge planning Services  CM Consult  Post Acute Care Choice:  NA Choice offered to:  NA  DME Arranged:  N/A DME Agency:  NA  HH Arranged:  NA HH Agency:  NA  Status of Service:  Completed, signed off  Medicare Important Message Given:  Yes Date Medicare IM Given:    Medicare IM give by:    Date Additional Medicare IM Given:    Additional Medicare Important Message give by:     If discussed at [place] of Stay Meetings, dates discussed:    Additional Comments:  Bethena Roys, RN 05/19/2015, 12:09 PM

## 2015-05-19 NOTE — Progress Notes (Signed)
While going over patients discharge paperwork, pt mentioned she had 3/10 left sub scapular pain. She stated it was similar to the scapular pain she had on admission. Pt states, " it feels like if you rub it, it may go away." MD paged. 2 SL nitro given, EKG obtained. MD made aware. MD to bedside. Escorted  pt to cath lab. Pt states, "that second nitro you gave me stopped that pain."

## 2015-05-19 NOTE — Progress Notes (Signed)
PROGRESS NOTE  Subjective:   70 yo  Hx of mechanical AVR Breast cancer,  Admitted with anterior STEMI last night.  Cath revealed a mid LAD occlusion - stented with a 2.25 x 20 DES .  Arm is much better . No cp, "a little shaky this morning"  Objective:    Vital Signs:   Temp:  [98.5 F (36.9 C)-99 F (37.2 C)] 99 F (37.2 C) (03/14 0444) Pulse Rate:  [90-107] 92 (03/14 0444) Resp:  [16-20] 19 (03/14 0444) BP: (94-118)/(55-75) 118/55 mmHg (03/14 0444) SpO2:  [96 %-100 %] 96 % (03/14 0444) Weight:  [128 lb 14.4 oz (58.469 kg)] 128 lb 14.4 oz (58.469 kg) (03/14 0444)  Last BM Date: 05/16/15   24-hour weight change: Weight change:   Weight trends: Filed Weights   05/16/15 2100 05/19/15 0444  Weight: 134 lb 9.6 oz (61.054 kg) 128 lb 14.4 oz (58.469 kg)    Intake/Output:  03/13 0701 - 03/14 0700 In: 580 [P.O.:580] Out: -      Physical Exam: BP 118/55 mmHg  Pulse 92  Temp(Src) 99 F (37.2 C) (Oral)  Resp 19  Ht 5\' 1"  (1.549 m)  Wt 128 lb 14.4 oz (58.469 kg)  BMI 24.37 kg/m2  SpO2 96%  Wt Readings from Last 3 Encounters:  05/19/15 128 lb 14.4 oz (58.469 kg)  04/24/15 131 lb (59.421 kg)  02/12/15 131 lb (59.421 kg)    General: Vital signs reviewed and noted.   Head: Normocephalic, atraumatic.  Eyes: conjunctivae/corneas clear.  EOM's intact.   Throat: normal  Neck:  normal   Lungs:    clear   Heart:  RR , S1   Mechanical jS2   Abdomen:  Soft, non-tender, non-distended    Extremities: Right radial cath site is better ,  peticial rash.   bruised rt forarm  Neurologic: A&O X3, CN II - XII are grossly intact.   Psych: Normal    Labs: BMET:  Recent Labs  05/16/15 1911  05/16/15 1932 05/17/15 0304  NA 141  < > 142 139  K 4.1  < > 3.3* 3.7  CL 106  < > 106 107  CO2 23  --   --  19*  GLUCOSE 130*  < > 145* 180*  BUN 21*  < > 22* 14  CREATININE 0.87  < > 0.70 0.79  CALCIUM 9.1  --   --  8.2*  < > = values in this interval not  displayed.  Liver function tests: No results for input(s): AST, ALT, ALKPHOS, BILITOT, PROT, ALBUMIN in the last 72 hours. No results for input(s): LIPASE, AMYLASE in the last 72 hours.  CBC:  Recent Labs  05/16/15 1911  05/18/15 0335 05/19/15 0430  WBC 9.9  < > 9.6 10.0  NEUTROABS 7.1  --   --   --   HGB 13.9  < > 12.7 13.4  HCT 43.1  < > 40.2 41.9  MCV 86.2  < > 87.2 87.1  PLT 319  < > 274 274  < > = values in this interval not displayed.  Cardiac Enzymes:  Recent Labs  05/16/15 2147 05/17/15 0304 05/17/15 0955 05/17/15 1445  TROPONINI 51.54* 26.52* 35.10* 24.82*    Coagulation Studies:  Recent Labs  05/16/15 1911 05/17/15 0304 05/18/15 0509 05/19/15 0430  LABPROT 20.9* 25.7* 25.2* 23.4*  INR 1.80* 2.38* 2.32* 2.10*    Other: Invalid input(s): POCBNP No results for input(s): DDIMER in the last  72 hours. No results for input(s): HGBA1C in the last 72 hours. No results for input(s): CHOL, HDL, LDLCALC, TRIG, CHOLHDL in the last 72 hours. No results for input(s): TSH, T4TOTAL, T3FREE, THYROIDAB in the last 72 hours.  Invalid input(s): FREET3 No results for input(s): VITAMINB12, FOLATE, FERRITIN, TIBC, IRON, RETICCTPCT in the last 72 hours.   Other results:  EKG  ( personally reviewed )  - NSR , ST elevation V2-V5    Medications:    Infusions:    Scheduled Medications: . aspirin  81 mg Oral Daily  . clopidogrel  75 mg Oral Q breakfast  . cycloSPORINE  1 drop Both Eyes BID  . loratadine  10 mg Oral Daily  . metoprolol succinate  25 mg Oral Daily  . pantoprazole  40 mg Oral Daily  . potassium chloride SA  20 mEq Oral Daily  . rosuvastatin  20 mg Oral QHS  . senna  2 tablet Oral Daily  . sodium chloride flush  3 mL Intravenous Q12H  . Warfarin - Pharmacist Dosing Inpatient   Does not apply q1800   Echo 05/18/15 Study Conclusions  - Left ventricle: The cavity size was normal. There was mild focal  basal hypertrophy of the septum. Systolic  function was severely  reduced. The estimated ejection fraction was in the range of 25%  to 30%. There is akinesis of the mid-apicalanteroseptal,  anterolateral, inferoseptal, and apical myocardium. Doppler  parameters are consistent with abnormal left ventricular  relaxation (grade 1 diastolic dysfunction). - Aortic valve: Mildly thickened, mildly calcified leaflets. A  mechanical prosthesis was present and functioning normally. Peak  velocity (S): 253 cm/s.    Assessment/ Plan:   Principal Problem:   Acute ST elevation myocardial infarction (STEMI) of anterolateral wall (HCC) Active Problems:   Long term current use of anticoagulant therapy   H/O aortic valve replacement   ST elevation (STEMI) myocardial infarction involving left anterior descending coronary artery (HCC)  1.  Ant. Wall STEMI,   S/p stenting of LAD Doing well  Right wrist has some bleeding .   Needs to keep her hand still Some bruising from the TR band INR is 2.10 Coumadin , plavix , ASA for 1 month and then plan on stopping the ASA at that time .   Should remain on coumadin and plavix for a year .   2. AVR - stable , INR is 2.32.   Will need to be on coumadin ,   3. Significant LVD- EF 25-30%  Disposition: Dr Acie Fredrickson to see-(I did not discuss echo results with her). Probably home later this am once she has ambulated. Consider adding  ACE and Aldactone (will defer to Dr Acie Fredrickson). F/U echo in 3 months. OV 7-10 days.   Length of Stay: 3  Kerin Ransom PA-C 05/19/2015 8:23 AM  Attending Note:   The patient was seen and examined.  Agree with assessment and plan as noted above.  Changes made to the above note as needed.  1. CAD - s/p ant. MI Complicated by severe reduction of LV function Continue plavix and ASA , coumadin for 1 month and then plavix and coumadin for a year Continue crestor  2. Acute systolic CHF:   On Toprol  Will add Losartan 25 a day  Resume chlorthalidone 25 mg daily DC  verapimil  Anticipate DC to home today - assuming she is able to ambulate wel Needs a coumadin visit Thursday Will need a TOC visit with in a week I'll  see her in several weeks    Ramond Dial., MD, [facility] 05/19/2015, 9:24 AM [address],  [place] Pager [phone]

## 2015-05-19 NOTE — Discharge Summary (Signed)
Discharge Summary    Patient ID: Amanda Wiggins,  MRN: [id number], DOB/AGE: [id number] 69 y.o.  Admit date: 05/16/2015 Discharge date: 05/19/2015  Primary Care Provider: Nyoka Cowden Primary Cardiologist: Dr Acie Fredrickson  Discharge Diagnoses    Principal Problem:   Acute ST elevation myocardial infarction (STEMI) of anterolateral wall [facility]) Active Problems:   CAD S/P urgent PCI 05/16/15   Dyslipidemia   Essential hypertension   Cardiomyopathy, ischemic   Hx Breast Cancer, IDC, Stage I   Long term current use of anticoagulant therapy   H/O aortic valve replacement   Allergies Allergies  Allergen Reactions  . Latex Hives  . Meperidine Hcl Nausea And Vomiting  . Sulfamethoxazole Nausea And Vomiting    REACTION: unspecified    Diagnostic Studies/Procedures    Urgent Cath/ PCI 05/16/15 Echo 05/18/15 _____________   History of Present Illness     70 y/o female with a history of mechanical AVR, adm 3/11/7 with ant STEMI  Hospital Course        70 y.o. female with a history of mechanical prosthetic aortic valve in 2003, HTN, HLD, breast CA, she is on chronic warfarin therapy for mechanical AVR. She had no prior history of CAD. She presented to [facility]/16/15 with chest pain and suspected anterior STEMI. Her transmitted EKG showed subtle ST elevations in leads I and aVL. She had very low voltage in her limb leads, making the reading of EKG very difficult. Upon arrival to [facility] lateral changes were more difficult to determine, however ST segments in V1 1, V2 and V3 seem to be more elevated than the preliminary EKG and she continued to have chest pain. She was taken to the cath lab by Dr Ellyn Hack. Cath revealed a total mid LAD occlusion and distal LAD disease. She received a mLAD DES and dLAD POBA. She was transferred to CCU post PCI. The plan is for ASA/Plavix/ Coumadin for 30 days- then drop ASA, then Coumadin and Plavix for a year, then stop Plavix. An echo done prior  to discharge showed her EF to be 25%. Dr Acie Fredrickson adjusted her medications at discharge, adding an ARB and stopping her Calan. She will need an INR 05/21/15 and a TOC f/u in 7-10 days.   _____________  Discharge Vitals Blood pressure 123/65, pulse 98, temperature 99.3 F (37.4 C), temperature source Oral, resp. rate 17, height 5\' 1"  (1.549 m), weight 128 lb 14.4 oz (58.469 kg), SpO2 98 %.  Filed Weights   05/16/15 2100 05/19/15 0444  Weight: 134 lb 9.6 oz (61.054 kg) 128 lb 14.4 oz (58.469 kg)    Labs & Radiologic Studies     CBC  Recent Labs  05/16/15 1911  05/18/15 0335 05/19/15 0430  WBC 9.9  < > 9.6 10.0  NEUTROABS 7.1  --   --   --   HGB 13.9  < > 12.7 13.4  HCT 43.1  < > 40.2 41.9  MCV 86.2  < > 87.2 87.1  PLT 319  < > 274 274  < > = values in this interval not displayed. Basic Metabolic Panel  Recent Labs  05/16/15 1911  05/16/15 1932 05/17/15 0304  NA 141  < > 142 139  K 4.1  < > 3.3* 3.7  CL 106  < > 106 107  CO2 23  --   --  19*  GLUCOSE 130*  < > 145* 180*  BUN 21*  < > 22* 14  CREATININE 0.87  < >  0.70 0.79  CALCIUM 9.1  --   --  8.2*  < > = values in this interval not displayed. Liver Function Tests No results for input(s): AST, ALT, ALKPHOS, BILITOT, PROT, ALBUMIN in the last 72 hours. No results for input(s): LIPASE, AMYLASE in the last 72 hours. Cardiac Enzymes  Recent Labs  05/17/15 0304 05/17/15 0955 05/17/15 1445  TROPONINI 26.52* 35.10* 24.82*   BNP Invalid input(s): POCBNP D-Dimer No results for input(s): DDIMER in the last 72 hours. Hemoglobin A1C No results for input(s): HGBA1C in the last 72 hours. Fasting Lipid Panel No results for input(s): CHOL, HDL, LDLCALC, TRIG, CHOLHDL, LDLDIRECT in the last 72 hours. Thyroid Function Tests No results for input(s): TSH, T4TOTAL, T3FREE, THYROIDAB in the last 72 hours.  Invalid input(s): FREET3  No results found.  Disposition   Pt is being discharged home today in good  condition.  Follow-up Plans & Appointments    Follow-up Information    Follow up with Nahser, Wonda Cheng, MD.   Specialty:  Cardiology   Why:  office will contact you   Contact information:   [address] [place] [address] [phone]      Discharge Instructions    Amb Referral to Cardiac Rehabilitation    Complete by:  As directed   Diagnosis:   PCI Myocardial Infarction             Discharge Medications   Current Discharge Medication List    START taking these medications   Details  aspirin 81 MG chewable tablet Chew 1 tablet (81 mg total) by mouth daily.    clopidogrel (PLAVIX) 75 MG tablet Take 1 tablet (75 mg total) by mouth daily with breakfast. Qty: 90 tablet, Refills: 3    losartan (COZAAR) 25 MG tablet Take 1 tablet (25 mg total) by mouth daily. Qty: 90 tablet, Refills: 3    metoprolol succinate (TOPROL-XL) 25 MG 24 hr tablet Take 1 tablet (25 mg total) by mouth daily. Qty: 90 tablet, Refills: 3    nitroGLYCERIN (NITROSTAT) 0.4 MG SL tablet Place 1 tablet (0.4 mg total) under the tongue every 5 (five) minutes as needed for chest pain. Qty: 25 tablet, Refills: 2    pantoprazole (PROTONIX) 40 MG tablet Take 1 tablet (40 mg total) by mouth daily. Qty: 90 tablet, Refills: 3      CONTINUE these medications which have CHANGED   Details  rosuvastatin (CRESTOR) 20 MG tablet Take 1 tablet (20 mg total) by mouth at bedtime. Qty: 90 tablet, Refills: 3      CONTINUE these medications which have NOT CHANGED   Details  BIOTIN PO Take 1 tablet by mouth every morning.     Calcium Carbonate-Vitamin D (CALCIUM 600+D) 600-400 MG-UNIT per tablet Take 1 tablet by mouth 2 (two) times daily.      chlorthalidone (HYGROTON) 25 MG tablet TAKE 1 TABLET DAILY FOR FOUR DAYS PER WEEK Qty: 90 tablet, Refills: 2    cycloSPORINE (RESTASIS) 0.05 % ophthalmic emulsion Place 1 drop into both eyes 2 (two) times daily as needed.     Glucosamine-Chondroit-Vit  C-Mn (GLUCOSAMINE CHONDR 1500 COMPLX) CAPS Take 1 capsule by mouth every morning.     loratadine (CLARITIN) 10 MG tablet Take 10 mg by mouth daily.      Multiple Vitamin (MULTIVITAMIN) capsule Take 1 capsule by mouth daily.      potassium chloride SA (KLOR-CON M20) 20 MEQ tablet Take 1 tablet (20 mEq total) by mouth  daily. Qty: 90 tablet, Refills: 3    senna (SENOKOT) 8.6 MG tablet Take 2 tablets by mouth daily.      temazepam (RESTORIL) 30 MG capsule Take 1 capsule (30 mg total) by mouth at bedtime as needed. Qty: 90 capsule, Refills: 2   Associated Diagnoses: Insomnia due to medical condition    warfarin (COUMADIN) 5 MG tablet Take 2.5-5 mg by mouth See admin instructions. Take 1/2 tablet on Monday and Thursday then take 1 tablet all the other days      STOP taking these medications     omeprazole (PRILOSEC) 20 MG capsule      SUMAtriptan (IMITREX) 25 MG tablet      verapamil (CALAN-SR) 180 MG CR tablet      ALPRAZolam (XANAX) 0.25 MG tablet          Aspirin prescribed at discharge?  Yes High Intensity Statin Prescribed? (Lipitor 40-80mg  or Crestor 20-40mg ): Yes Beta Blocker Prescribed? Yes For EF 45% or less, Was ACEI/ARB Prescribed? Yes ADP Receptor Inhibitor Prescribed? (i.e. Plavix etc.-Includes Medically Managed Patients): Yes For EF <40%, Aldosterone Inhibitor Prescribed? No: Was EF assessed during THIS hospitalization? Yes Was Cardiac Rehab II ordered? (Included Medically managed Patients): Yes   Outstanding Labs/Studies    Duration of Discharge Encounter   Greater than 30 minutes including physician time.  Angelena Form K PA 05/19/2015, 10:18 AM   Attending Note:   The patient was seen and examined.  Agree with assessment and plan as noted above.  Changes made to the above note as needed.  Pt had been doing great. Walked this am and developed the band like chest pain across her chest. - very similar to her presenting pain   Will give  NTG. Repeat ECG  Cancel DC for now  Will watch another day    Ramond Dial., MD, [facility] 05/19/2015, 11:38 AM [address],  [place] Pager [phone]

## 2015-05-19 NOTE — Progress Notes (Signed)
ANTICOAGULATION CONSULT NOTE - Follow-up Consult  Pharmacy Consult for Coumadin Indication: mechanical AVR  Allergies  Allergen Reactions  . Latex Hives  . Meperidine Hcl Nausea And Vomiting  . Sulfamethoxazole Nausea And Vomiting    REACTION: unspecified    Patient Measurements: Height: 5\' 1"  (154.9 cm) Weight: 128 lb 14.4 oz (58.469 kg) IBW/kg (Calculated) : 47.8  Vital Signs: Temp: 99.3 F (37.4 C) (03/14 0817) Temp Source: Oral (03/14 0817) BP: 123/65 mmHg (03/14 0817) Pulse Rate: 98 (03/14 0817)  Labs:  Recent Labs  05/16/15 1911 05/16/15 1924 05/16/15 1932  05/17/15 0304 05/17/15 0955 05/17/15 1445 05/18/15 0335 05/18/15 0509 05/19/15 0430  HGB 13.9 15.3* 13.6  --  12.3  --   --  12.7  --  13.4  HCT 43.1 45.0 40.0  --  39.1  --   --  40.2  --  41.9  PLT 319  --   --   --  281  --   --  274  --  274  APTT 34  --   --   --   --   --   --   --   --   --   LABPROT 20.9*  --   --   --  25.7*  --   --   --  25.2* 23.4*  INR 1.80*  --   --   --  2.38*  --   --   --  2.32* 2.10*  CREATININE 0.87 0.80 0.70  --  0.79  --   --   --   --   --   TROPONINI  --   --   --   < > 26.52* 35.10* 24.82*  --   --   --   < > = values in this interval not displayed.  Estimated Creatinine Clearance: 55.4 mL/min (by C-G formula based on Cr of 0.79).  Assessment: 70 y.o. F presents with STEMI. Pt on chronic coumadin for mechanical AVR. INR is therapeutic today at 2.1. No bleeding noted. She is noted s/p PCI w/ stent to LAD and plans coumadin/ASA/plavix for 1 month then coumadin/plavix only.  PTA dosing: Home dose: 2.5mg  on Mon/Thur and 5mg  all other days   Goal of Therapy:  INR 2-3 Monitor platelets by anticoagulation protocol: Yes   Plan:  - Warfarin 5mg  PO x 1 tonight if she is still here - Daily INR *Resume home regimen at discharge  Salome Arnt, PharmD, BCPS Pager # [phone] 05/19/2015 11:16 AM

## 2015-05-19 NOTE — Telephone Encounter (Signed)
New message      TCM appt on 05-26-15 with Rhonda Barrett.  Pt has not been discharged as of this writing.

## 2015-05-19 NOTE — Care Management Important Message (Signed)
Important Message  Patient Details  Name: Amanda Wiggins MRN: [id number] Date of Birth: 25-Aug-1945   Medicare Important Message Given:  Yes    Nathen May 05/19/2015, 11:24 AM

## 2015-05-19 NOTE — Progress Notes (Signed)
ANTICOAGULATION CONSULT NOTE - Follow-up Consult  Pharmacy Consult for Coumadin Indication: mechanical AVR  Allergies  Allergen Reactions  . Latex Hives  . Meperidine Hcl Nausea And Vomiting  . Sulfamethoxazole Nausea And Vomiting    REACTION: unspecified    Patient Measurements: Height: 5\' 1"  (154.9 cm) Weight: 128 lb 14.4 oz (58.469 kg) IBW/kg (Calculated) : 47.8  Vital Signs: Temp: 99.3 F (37.4 C) (03/14 0817) Temp Source: Oral (03/14 0817) BP: 123/73 mmHg (03/14 1205) Pulse Rate: 84 (03/14 1131)  Labs:  Recent Labs  05/16/15 1911 05/16/15 1924 05/16/15 1932  05/17/15 0304 05/17/15 0955 05/17/15 1445 05/18/15 0335 05/18/15 0509 05/19/15 0430  HGB 13.9 15.3* 13.6  --  12.3  --   --  12.7  --  13.4  HCT 43.1 45.0 40.0  --  39.1  --   --  40.2  --  41.9  PLT 319  --   --   --  281  --   --  274  --  274  APTT 34  --   --   --   --   --   --   --   --   --   LABPROT 20.9*  --   --   --  25.7*  --   --   --  25.2* 23.4*  INR 1.80*  --   --   --  2.38*  --   --   --  2.32* 2.10*  CREATININE 0.87 0.80 0.70  --  0.79  --   --   --   --   --   TROPONINI  --   --   --   < > 26.52* 35.10* 24.82*  --   --   --   < > = values in this interval not displayed.  Estimated Creatinine Clearance: 55.4 mL/min (by C-G formula based on Cr of 0.79).  Assessment: 70 y.o. F presents with STEMI. Pt on chronic coumadin for mechanical AVR. She is noted s/p PCI w/ stent to LAD on 3/11 and was for discharge today but had an episode of CP and went back to cath where another stent was placed. -INR today is 2.1  -Spoke with Dr. Claiborne Billings: hold coumadin today (sheath in place)  PTA dosing: Home dose: 2.5mg  on Mon/Thur and 5mg  all other days   Goal of Therapy:  INR 2-3 Monitor platelets by anticoagulation protocol: Yes   Plan:  -No coumadin tonight -Daily PT/INR -Will follow plans in am  Hildred Laser, Pharm D 05/19/2015 3:28 PM

## 2015-05-19 NOTE — Progress Notes (Signed)
CARDIAC REHAB PHASE I   PRE:  Rate/Rhythm: 98-105 ST  BP:  Supine:   Sitting: 118/69  Standing:    SaO2:   MODE:  Ambulation: 550 ft   POST:  Rate/Rhythm: 115 ST  BP:  Supine:   Sitting: 114/79  Standing:    SaO2:  1005-1035 Pt walked and I monitored heart rate whole walk. Highest rate seen was 115. No CP. Since EF low, gave pt CHF booklet and discussed the zones. Pt has scales at home and we discussed daily weight, when to call MD with signs and symptoms, 2L fluid restriction and 2000 mg sodium restriction. Gave low sodium handouts.   Graylon Good, RN BSN  05/19/2015 10:32 AM

## 2015-05-20 DIAGNOSIS — I255 Ischemic cardiomyopathy: Secondary | ICD-10-CM

## 2015-05-20 DIAGNOSIS — Z9861 Coronary angioplasty status: Secondary | ICD-10-CM

## 2015-05-20 LAB — PROTIME-INR
INR: 1.94 — ABNORMAL HIGH (ref 0.00–1.49)
PROTHROMBIN TIME: 22.1 s — AB (ref 11.6–15.2)

## 2015-05-20 LAB — BASIC METABOLIC PANEL
Anion gap: 8 (ref 5–15)
BUN: 9 mg/dL (ref 6–20)
CALCIUM: 8.2 mg/dL — AB (ref 8.9–10.3)
CO2: 23 mmol/L (ref 22–32)
CREATININE: 0.79 mg/dL (ref 0.44–1.00)
Chloride: 108 mmol/L (ref 101–111)
GFR calc Af Amer: >60 mL/min (ref >60)
Glucose, Bld: 133 mg/dL — ABNORMAL HIGH (ref 65–99)
Potassium: 3.5 mmol/L (ref 3.5–5.1)
SODIUM: 139 mmol/L (ref 135–145)

## 2015-05-20 LAB — CBC
HCT: 35.5 % — ABNORMAL LOW (ref 36.0–46.0)
Hemoglobin: 11.4 g/dL — ABNORMAL LOW (ref 12.0–15.0)
MCH: 27.9 pg (ref 26.0–34.0)
MCHC: 32.1 g/dL (ref 30.0–36.0)
MCV: 86.8 fL (ref 78.0–100.0)
PLATELETS: 269 10*3/uL (ref 150–400)
RBC: 4.09 MIL/uL (ref 3.87–5.11)
RDW: 13.5 % (ref 11.5–15.5)
WBC: 7.2 10*3/uL (ref 4.0–10.5)

## 2015-05-20 LAB — TROPONIN I: TROPONIN I: 7.81 ng/mL — AB (ref <0.031)

## 2015-05-20 MED ORDER — CARVEDILOL 6.25 MG PO TABS
6.2500 mg | ORAL_TABLET | Freq: Two times a day (BID) | ORAL | Status: DC
  Administered 2015-05-20 – 2015-05-23 (×6): 6.25 mg via ORAL
  Filled 2015-05-20 (×6): qty 1

## 2015-05-20 MED ORDER — METOPROLOL TARTRATE 1 MG/ML IV SOLN
5.0000 mg | Freq: Once | INTRAVENOUS | Status: AC
  Administered 2015-05-20: 5 mg via INTRAVENOUS

## 2015-05-20 MED ORDER — NITROGLYCERIN 0.4 MG SL SUBL
SUBLINGUAL_TABLET | SUBLINGUAL | Status: AC
  Filled 2015-05-20: qty 1

## 2015-05-20 MED ORDER — METOPROLOL TARTRATE 1 MG/ML IV SOLN
INTRAVENOUS | Status: AC
  Filled 2015-05-20: qty 5

## 2015-05-20 MED ORDER — WARFARIN SODIUM 5 MG PO TABS
5.0000 mg | ORAL_TABLET | Freq: Once | ORAL | Status: AC
Start: 2015-05-20 — End: 2015-05-20
  Administered 2015-05-20: 5 mg via ORAL
  Filled 2015-05-20: qty 1

## 2015-05-20 MED ORDER — NITROGLYCERIN 0.4 MG SL SUBL
0.4000 mg | SUBLINGUAL_TABLET | SUBLINGUAL | Status: DC | PRN
  Administered 2015-05-20: 0.4 mg via SUBLINGUAL

## 2015-05-20 MED ORDER — ALPRAZOLAM 0.25 MG PO TABS
0.2500 mg | ORAL_TABLET | Freq: Three times a day (TID) | ORAL | Status: DC | PRN
  Administered 2015-05-20: 0.25 mg via ORAL
  Filled 2015-05-20: qty 1

## 2015-05-20 MED ORDER — ROSUVASTATIN CALCIUM 10 MG PO TABS
20.0000 mg | ORAL_TABLET | Freq: Every day | ORAL | Status: DC
  Administered 2015-05-20 – 2015-05-22 (×3): 20 mg via ORAL
  Filled 2015-05-20 (×2): qty 1
  Filled 2015-05-20: qty 2

## 2015-05-20 NOTE — Progress Notes (Signed)
Patient C/O sudden dull ache across mid/left shoulder blade. VSS, EKG obtained and 1 SL nitro given.  MD notified.  Pain decreased to 2/10.  New orders received, will continue to monitor. Woden, [name]

## 2015-05-20 NOTE — Progress Notes (Signed)
EKG CRITICAL VALUE     12 lead EKG performed.  Critical value noted. Aldona Bar, RN notified.   Yehuda Mao, [place] 05/20/2015 7:57 AM

## 2015-05-20 NOTE — Plan of Care (Signed)
Problem: Activity: Goal: Ability to return to baseline activity level will improve Outcome: Progressing Up ad lib. Bathroom privileges with one assist.  Problem: Cardiac: Goal: Vascular access site(s) Level 0-1 will be maintained Outcome: Progressing R radial site still bruised, but swelling has decreased and bruised is receding. R femoral site is level 0.

## 2015-05-20 NOTE — Progress Notes (Addendum)
PROGRESS NOTE  Subjective:   70 yo  Hx of mechanical AVR Breast cancer,  Admitted with anterior STEMI last night.  Cath revealed a mid LAD occlusion - stented with a 2.25 x 20 DES .  Pt was doing great yesterday but then suddenly developed CP - across her shoulders - very similar to her presenting pain . ECG showed acute ST elevation in the anterior leads.  She was taken back to the cath lab and was found to have an open LAD stent.   The vessel distal to the stent was small and had systolic collapse.   The area was stented.  She is doing better   Objective:    Vital Signs:   Temp:  [98.4 F (36.9 C)-99.6 F (37.6 C)] 98.4 F (36.9 C) (03/15 0400) Pulse Rate:  [81-108] 96 (03/15 0500) Resp:  [12-25] 15 (03/15 0500) BP: (85-123)/(46-88) 85/67 mmHg (03/15 0500) SpO2:  [94 %-100 %] 94 % (03/15 0500) Arterial Line BP: (115-127)/(67-73) 121/71 mmHg (03/14 1800)  Last BM Date: 05/16/15   24-hour weight change: Weight change:   Weight trends: Filed Weights   05/16/15 2100 05/19/15 0444  Weight: 134 lb 9.6 oz (61.054 kg) 128 lb 14.4 oz (58.469 kg)    Intake/Output:  03/14 0701 - 03/15 0700 In: 1540.2 [P.O.:480; I.V.:1060.2] Out: 750 [Urine:750]     Physical Exam: BP 85/67 mmHg  Pulse 96  Temp(Src) 98.4 F (36.9 C) (Oral)  Resp 15  Ht 5\' 1"  (1.549 m)  Wt 128 lb 14.4 oz (58.469 kg)  BMI 24.37 kg/m2  SpO2 94%  Wt Readings from Last 3 Encounters:  05/19/15 128 lb 14.4 oz (58.469 kg)  04/24/15 131 lb (59.421 kg)  02/12/15 131 lb (59.421 kg)    General: Vital signs reviewed and noted.   Head: Normocephalic, atraumatic.  Eyes: conjunctivae/corneas clear.  EOM's intact.   Throat: normal  Neck:  normal   Lungs:    clear   Heart:  RR , S1   Mechanical S2   Abdomen:  Soft, non-tender, non-distended    Extremities: Right radial cath site is better ,  peticial rash.   bruised rt forarm  Neurologic: A&O X3, CN II - XII are grossly intact.   Psych: Normal     Labs: BMET:  Recent Labs  05/20/15 0300  NA 139  K 3.5  CL 108  CO2 23  GLUCOSE 133*  BUN 9  CREATININE 0.79  CALCIUM 8.2*    Liver function tests: No results for input(s): AST, ALT, ALKPHOS, BILITOT, PROT, ALBUMIN in the last 72 hours. No results for input(s): LIPASE, AMYLASE in the last 72 hours.  CBC:  Recent Labs  05/19/15 0430 05/20/15 0300  WBC 10.0 7.2  HGB 13.4 11.4*  HCT 41.9 35.5*  MCV 87.1 86.8  PLT 274 269    Cardiac Enzymes:  Recent Labs  05/17/15 0955 05/17/15 1445 05/19/15 2318  TROPONINI 35.10* 24.82* 7.81*    Coagulation Studies:  Recent Labs  05/18/15 0509 05/19/15 0430 05/20/15 0300  LABPROT 25.2* 23.4* 22.1*  INR 2.32* 2.10* 1.94*    Other: Invalid input(s): POCBNP No results for input(s): DDIMER in the last 72 hours. No results for input(s): HGBA1C in the last 72 hours. No results for input(s): CHOL, HDL, LDLCALC, TRIG, CHOLHDL in the last 72 hours. No results for input(s): TSH, T4TOTAL, T3FREE, THYROIDAB in the last 72 hours.  Invalid input(s): FREET3 No results for input(s): VITAMINB12, FOLATE, FERRITIN, TIBC,  IRON, RETICCTPCT in the last 72 hours.   Other results:  EKG  ( personally reviewed )  - NSR , persistent ST elevation V2-V5    Medications:    Infusions: . nitroGLYCERIN Stopped (05/20/15 0517)    Scheduled Medications: . aspirin  81 mg Oral Daily  . atorvastatin  80 mg Oral q1800  . carvedilol  6.25 mg Oral BID WC  . clopidogrel  75 mg Oral Q breakfast  . cycloSPORINE  1 drop Both Eyes BID  . loratadine  10 mg Oral Daily  . losartan  25 mg Oral Daily  . pantoprazole  40 mg Oral Daily  . potassium chloride SA  20 mEq Oral Daily  . senna  2 tablet Oral Daily  . sodium chloride flush  3 mL Intravenous Q12H  . sodium chloride flush  3 mL Intravenous Q12H  . Warfarin - Pharmacist Dosing Inpatient   Does not apply q1800   Echo 05/18/15 Study Conclusions  - Left ventricle: The cavity size was  normal. There was mild focal  basal hypertrophy of the septum. Systolic function was severely  reduced. The estimated ejection fraction was in the range of 25%  to 30%. There is akinesis of the mid-apicalanteroseptal,  anterolateral, inferoseptal, and apical myocardium. Doppler  parameters are consistent with abnormal left ventricular  relaxation (grade 1 diastolic dysfunction). - Aortic valve: Mildly thickened, mildly calcified leaflets. A  mechanical prosthesis was present and functioning normally. Peak  velocity (S): 253 cm/s.    Assessment/ Plan:   Principal Problem:   Acute ST elevation myocardial infarction (STEMI) of anterolateral wall (HCC) Active Problems:   H/O aortic valve replacement   Dyslipidemia   Essential hypertension   Hx Breast Cancer, IDC, Stage I   Long term current use of anticoagulant therapy   CAD S/P urgent PCI 05/16/15   Cardiomyopathy, ischemic   ST elevation (STEMI) myocardial infarction involving left anterior descending coronary artery ([place])  1.  [place] STEMI,   S/p stenting of LAD yesterday  I have personally reviewed the angiograms The distal LAD was open at cath yesterday but appeared to have systolic compression.    Perhaps she had some spasm of the distal LAD Will DC metoprolol and start Coreg. . Doing better Has persistently elevated ST segments  No further CP    2. AVR - stable , INR is 2.32.   Continue  coumadin ,   3. Significant LVD- EF 25-30% Will repeat echo tomorrow  Transfer to step down .   Length of Stay: 4     Creighton Longley, Wonda Cheng, MD  05/20/2015 8:03 AM    [address] [place],  [place], [place]  [address] Pager [phone] Phone: [phone]; Fax: [phone]   [facility]  [address] [place], [place]  [address] [phone]   Fax [phone]

## 2015-05-20 NOTE — Progress Notes (Signed)
Patient's BP trending down to 85/71 and 89/56; asymptomatic.  Evening dose of Coreg held, on-call Cardiology NP notified.  Will continue to monitor. [name], [name]

## 2015-05-20 NOTE — Progress Notes (Signed)
ANTICOAGULATION CONSULT NOTE - Follow-up Consult  Pharmacy Consult for Coumadin Indication: mechanical AVR  Allergies  Allergen Reactions  . Latex Hives  . Meperidine Hcl Nausea And Vomiting  . Sulfamethoxazole Nausea And Vomiting    REACTION: unspecified    Patient Measurements: Height: 5\' 1"  (154.9 cm) Weight: 128 lb 14.4 oz (58.469 kg) IBW/kg (Calculated) : 47.8  Vital Signs: Temp: 98 F (36.7 C) (03/15 1122) Temp Source: Oral (03/15 1122) BP: 87/67 mmHg (03/15 1200) Pulse Rate: 96 (03/15 0900)  Labs:  Recent Labs  05/17/15 1445  05/18/15 0335 05/18/15 0509 05/19/15 0430 05/19/15 2318 05/20/15 0300  HGB  --   < > 12.7  --  13.4  --  11.4*  HCT  --   --  40.2  --  41.9  --  35.5*  PLT  --   --  274  --  274  --  269  LABPROT  --   --   --  25.2* 23.4*  --  22.1*  INR  --   --   --  2.32* 2.10*  --  1.94*  CREATININE  --   --   --   --   --   --  0.79  TROPONINI 24.82*  --   --   --   --  7.81*  --   < > = values in this interval not displayed.  Estimated Creatinine Clearance: 55.4 mL/min (by C-G formula based on Cr of 0.79).  Assessment: 70 y.o. F presents with STEMI. Pt on chronic coumadin for mechanical AVR. She is noted s/p PCI w/ stent to LAD on 3/11 and was for discharge 3/14 but had an episode of CP and went back to cath where another stent was placed. -INR today is down to 1.9 - no warfarin given last night d/t cath  PTA dosing: Home dose: 2.5mg  on Mon/Thur and 5mg  all other days   Goal of Therapy:  INR 2-3 Monitor platelets by anticoagulation protocol: Yes   Plan:  -Warfarin 5mg  tonight -Daily PT/INR  Erin Hearing PharmD., BCPS Clinical Pharmacist Pager [phone] 05/20/2015 1:21 PM

## 2015-05-20 NOTE — Progress Notes (Signed)
Pt BP began titrating down, so decreased nitroglycerin gtt. Notified MD who gave orders to go ahead and discontinue the gtt as well as the NS fluids.

## 2015-05-20 NOTE — Telephone Encounter (Signed)
According to the pts chart she was readmitted back to [facility] on 05/19/15 and had another cath done.  Will continue to follow-up on the pts discharge status through triage, for possible TCM follow-up.

## 2015-05-20 NOTE — Plan of Care (Signed)
Problem: Cardiac: Goal: Vascular access site(s) Level 0-1 will be maintained Outcome: Not Progressing R rad site still 2

## 2015-05-20 NOTE — Progress Notes (Signed)
Orthopedic Tech Progress Note Patient Details:  Amanda Wiggins [birth year] [id number]  Ortho Devices Type of Ortho Device: Arm sling Ortho Device/Splint Location: rue Ortho Device/Splint Interventions: Application   Amanda Wiggins 05/20/2015, 11:09 AM

## 2015-05-21 ENCOUNTER — Inpatient Hospital Stay (HOSPITAL_COMMUNITY): Payer: Medicare Other

## 2015-05-21 DIAGNOSIS — I350 Nonrheumatic aortic (valve) stenosis: Secondary | ICD-10-CM

## 2015-05-21 DIAGNOSIS — I2510 Atherosclerotic heart disease of native coronary artery without angina pectoris: Secondary | ICD-10-CM

## 2015-05-21 LAB — CBC
HCT: 37.4 % (ref 36.0–46.0)
HEMOGLOBIN: 11.7 g/dL — AB (ref 12.0–15.0)
MCH: 27.5 pg (ref 26.0–34.0)
MCHC: 31.3 g/dL (ref 30.0–36.0)
MCV: 88 fL (ref 78.0–100.0)
PLATELETS: 288 10*3/uL (ref 150–400)
RBC: 4.25 MIL/uL (ref 3.87–5.11)
RDW: 13.4 % (ref 11.5–15.5)
WBC: 7.4 10*3/uL (ref 4.0–10.5)

## 2015-05-21 LAB — ECHOCARDIOGRAM COMPLETE
Height: 61 in
Weight: 2062.4 oz

## 2015-05-21 LAB — PROTIME-INR
INR: 1.6 — AB (ref 0.00–1.49)
PROTHROMBIN TIME: 19.1 s — AB (ref 11.6–15.2)

## 2015-05-21 MED ORDER — PERFLUTREN LIPID MICROSPHERE
1.0000 mL | INTRAVENOUS | Status: AC | PRN
  Filled 2015-05-21: qty 10

## 2015-05-21 MED ORDER — WARFARIN SODIUM 5 MG PO TABS
5.0000 mg | ORAL_TABLET | Freq: Once | ORAL | Status: AC
  Administered 2015-05-21: 5 mg via ORAL
  Filled 2015-05-21: qty 1

## 2015-05-21 MED ORDER — PERFLUTREN LIPID MICROSPHERE
INTRAVENOUS | Status: AC
  Administered 2015-05-21: 2 mL
  Filled 2015-05-21: qty 10

## 2015-05-21 NOTE — Telephone Encounter (Signed)
Pt still admitted.

## 2015-05-21 NOTE — Progress Notes (Signed)
ANTICOAGULATION CONSULT NOTE - Follow-up Consult  Pharmacy Consult for Coumadin Indication: mechanical AVR  Allergies  Allergen Reactions  . Latex Hives  . Meperidine Hcl Nausea And Vomiting  . Sulfamethoxazole Nausea And Vomiting    REACTION: unspecified    Patient Measurements: Height: 5\' 1"  (154.9 cm) Weight: 128 lb 14.4 oz (58.469 kg) IBW/kg (Calculated) : 47.8  Vital Signs: Temp: 98.8 F (37.1 C) (03/16 0800) Temp Source: Oral (03/16 0800) BP: 106/48 mmHg (03/16 0800) Pulse Rate: 88 (03/16 0800)  Labs:  Recent Labs  05/19/15 0430 05/19/15 2318 05/20/15 0300 05/21/15 0300  HGB 13.4  --  11.4* 11.7*  HCT 41.9  --  35.5* 37.4  PLT 274  --  269 288  LABPROT 23.4*  --  22.1* 19.1*  INR 2.10*  --  1.94* 1.60*  CREATININE  --   --  0.79  --   TROPONINI  --  7.81*  --   --     Estimated Creatinine Clearance: 55.4 mL/min (by C-G formula based on Cr of 0.79).  Assessment: 70 y.o. F presents with STEMI. Pt on chronic coumadin for mechanical AVR. She is noted s/p PCI w/ stent to LAD on 3/11 and was for discharge 3/14 but had an episode of CP and went back to cath where another stent was placed. -INR today is down to 1.6 - no warfarin given 3/14 d/t cath. CBC has remained stable.  PTA dosing: Home dose: 2.5mg  on Mon/Thur and 5mg  all other days   Goal of Therapy:  INR 2-3 Monitor platelets by anticoagulation protocol: Yes   Plan:  -Repeat Warfarin 5mg  tonight -Daily PT/INR  Erin Hearing PharmD., BCPS Clinical Pharmacist Pager [phone] 05/21/2015 9:27 AM

## 2015-05-21 NOTE — Progress Notes (Signed)
PROGRESS NOTE  Subjective:   70 yo  Hx of mechanical AVR Breast cancer,  Admitted with anterior STEMI last night.  Cath revealed a mid LAD occlusion - stented with a 2.25 x 20 DES .  Pt was doing great yesterday but then suddenly developed CP - across her shoulders - very similar to her presenting pain . ECG showed acute ST elevation in the anterior leads.  She was taken back to the cath lab and was found to have an open LAD stent.   The vessel distal to the stent was small and had systolic collapse.   The area was stented.     She had some CP / back pain yesterday .   The transfer was delayed. Doing better this am   Will transfer to tele  Objective:    Vital Signs:   Temp:  [98 F (36.7 C)-99.2 F (37.3 C)] 98.8 F (37.1 C) (03/16 0800) Pulse Rate:  [88-96] 88 (03/16 0800) Resp:  [16-20] 16 (03/16 0800) BP: (85-131)/(44-79) 106/48 mmHg (03/16 0800) SpO2:  [95 %-99 %] 95 % (03/16 0800)  Last BM Date: 05/20/15   24-hour weight change: Weight change:   Weight trends: [facility] Weights   05/16/15 2100 05/19/15 0444  Weight: 134 lb 9.6 oz (61.054 kg) 128 lb 14.4 oz (58.469 kg)    Intake/Output:  03/15 0701 - 03/16 0700 In: 970 [P.O.:960; I.V.:10] Out: 400 [Urine:400]     Physical Exam: BP 106/48 mmHg  Pulse 88  Temp(Src) 98.8 F (37.1 C) (Oral)  Resp 16  Ht 5\' 1"  (1.549 m)  Wt 128 lb 14.4 oz (58.469 kg)  BMI 24.37 kg/m2  SpO2 95%  Wt Readings from Last 3 Encounters:  05/19/15 128 lb 14.4 oz (58.469 kg)  04/24/15 131 lb (59.421 kg)  02/12/15 131 lb (59.421 kg)    General: Vital signs reviewed and noted.   Head: Normocephalic, atraumatic.  Eyes: conjunctivae/corneas clear.  EOM's intact.   Throat: normal  Neck:  normal   Lungs:    clear   Heart:  RR , S1   Mechanical S2   Abdomen:  Soft, non-tender, non-distended    Extremities: Right radial cath site is better ,  peticial rash.   bruised rt forarm  Neurologic: A&O X3, CN II - XII are  grossly intact.   Psych: Normal    Labs: BMET:  Recent Labs  05/20/15 0300  NA 139  K 3.5  CL 108  CO2 23  GLUCOSE 133*  BUN 9  CREATININE 0.79  CALCIUM 8.2*    Liver function tests: No results for input(s): AST, ALT, ALKPHOS, BILITOT, PROT, ALBUMIN in the last 72 hours. No results for input(s): LIPASE, AMYLASE in the last 72 hours.  CBC:  Recent Labs  05/20/15 0300 05/21/15 0300  WBC 7.2 7.4  HGB 11.4* 11.7*  HCT 35.5* 37.4  MCV 86.8 88.0  PLT 269 288    Cardiac Enzymes:  Recent Labs  05/19/15 2318  TROPONINI 7.81*    Coagulation Studies:  Recent Labs  05/19/15 0430 05/20/15 0300 05/21/15 0300  LABPROT 23.4* 22.1* 19.1*  INR 2.10* 1.94* 1.60*    Other: Invalid input(s): POCBNP No results for input(s): DDIMER in the last 72 hours. No results for input(s): HGBA1C in the last 72 hours. No results for input(s): CHOL, HDL, LDLCALC, TRIG, CHOLHDL in the last 72 hours. No results for input(s): TSH, T4TOTAL, T3FREE, THYROIDAB in the last 72 hours.  Invalid input(s):  FREET3 No results for input(s): VITAMINB12, FOLATE, FERRITIN, TIBC, IRON, RETICCTPCT in the last 72 hours.   Other results:  EKG  ( personally reviewed )  - NSR , persistent ST elevation V2-V5    Medications:    Infusions: . nitroGLYCERIN Stopped (05/20/15 0517)    Scheduled Medications: . aspirin  81 mg Oral Daily  . carvedilol  6.25 mg Oral BID WC  . clopidogrel  75 mg Oral Q breakfast  . cycloSPORINE  1 drop Both Eyes BID  . loratadine  10 mg Oral Daily  . losartan  25 mg Oral Daily  . pantoprazole  40 mg Oral Daily  . potassium chloride SA  20 mEq Oral Daily  . rosuvastatin  20 mg Oral q1800  . senna  2 tablet Oral Daily  . sodium chloride flush  3 mL Intravenous Q12H  . sodium chloride flush  3 mL Intravenous Q12H  . Warfarin - Pharmacist Dosing Inpatient   Does not apply q1800   Echo 05/18/15 Study Conclusions  - Left ventricle: The cavity size was normal.  There was mild focal  basal hypertrophy of the septum. Systolic function was severely  reduced. The estimated ejection fraction was in the range of 25%  to 30%. There is akinesis of the mid-apicalanteroseptal,  anterolateral, inferoseptal, and apical myocardium. Doppler  parameters are consistent with abnormal left ventricular  relaxation (grade 1 diastolic dysfunction). - Aortic valve: Mildly thickened, mildly calcified leaflets. A  mechanical prosthesis was present and functioning normally. Peak  velocity (S): 253 cm/s.    Assessment/ Plan:   Principal Problem:   Acute ST elevation myocardial infarction (STEMI) of anterolateral wall (HCC) Active Problems:   H/O aortic valve replacement   Dyslipidemia   Essential hypertension   Hx Breast Cancer, IDC, Stage I   Long term current use of anticoagulant therapy   CAD S/P urgent PCI 05/16/15   Cardiomyopathy, ischemic   ST elevation (STEMI) myocardial infarction involving left anterior descending coronary artery ([place])  1.  Ant. Wall STEMI,   S/p stenting of LAD yesterday  I have personally reviewed the angiograms The distal LAD was open at cath yesterday but appeared to have systolic compression.    Perhaps she had some spasm of the distal LAD Will DC metoprolol and start Coreg. . Doing better Has persistently elevated ST segments  No further CP  Transfer to tell   2. AVR - stable , INR is 1.6 today .   Continue  coumadin ,   3. Significant LVD- EF 25-30% Will repeat echo today    Transfer to step down .   Length of Stay: 5     Nahser, Wonda Cheng, MD  05/21/2015 8:59 AM    [address] [place],  [place], [place]  [address] Pager [phone] Phone: [phone]; Fax: [phone]   [facility]  [address] [place],   [address] [phone]   Fax [phone]

## 2015-05-21 NOTE — Progress Notes (Signed)
CARDIAC REHAB PHASE I   PRE:  Rate/Rhythm: 90 SR : BP:  Sitting: 94/46        SaO2: 98 RA  MODE:  Ambulation: 350 ft   POST:  Rate/Rhythm: 99 SR  BP:  Sitting: 74/64         SaO2: 99 RA  Pt in bed, anxious, very talkative, difficult to redirect conversation. Pt agreeable to walk. Pt ambulated 350 ft on RA, handheld assist, slow, steady gait, tolerated well, no complaints. Pt BP low, pt asymptomatic, RN aware. Pt to bed per pt request after walk, call bell within reach, will follow.  [id number] Lenna Sciara, RN, BSN 05/21/2015 2:55 PM

## 2015-05-21 NOTE — Progress Notes (Signed)
Pt transferred to room 2W29 via wheelchair with belongings, chart, and daily Meds. No questions or complaints at this time.  Pt states she called husband to inform him of room change. Report given to receiving nurse, Katharine Look, RN.    Loni Muse, RN

## 2015-05-21 NOTE — Progress Notes (Signed)
  Echocardiogram 2D Echocardiogram has been performed.  Amanda Wiggins M 05/21/2015, 11:49 AM

## 2015-05-22 DIAGNOSIS — I10 Essential (primary) hypertension: Secondary | ICD-10-CM

## 2015-05-22 DIAGNOSIS — I959 Hypotension, unspecified: Secondary | ICD-10-CM

## 2015-05-22 LAB — PROTIME-INR
INR: 1.75 — ABNORMAL HIGH (ref 0.00–1.49)
Prothrombin Time: 20.4 seconds — ABNORMAL HIGH (ref 11.6–15.2)

## 2015-05-22 MED ORDER — WARFARIN SODIUM 5 MG PO TABS
5.0000 mg | ORAL_TABLET | Freq: Once | ORAL | Status: AC
  Administered 2015-05-22: 5 mg via ORAL
  Filled 2015-05-22: qty 1

## 2015-05-22 NOTE — Progress Notes (Signed)
PROGRESS NOTE  Subjective:   70 yo  Hx of mechanical AVR Breast cancer,  Admitted with anterior STEMI last night.  Cath revealed a mid LAD occlusion - stented with a 2.25 x 20 DES .  Pt was doing great yesterday but then suddenly developed CP - across her shoulders - very similar to her presenting pain . ECG showed acute ST elevation in the anterior leads.  She was taken back to the cath lab and was found to have an open LAD stent.   The vessel distal to the stent was small and had systolic collapse.   The area was stented.     She had some CP / back pain yesterday .   The transfer was delayed. Doing better this am   Will transfer to tele  Walked 350 feet yesterday   Objective:    Vital Signs:   Temp:  [98 F (36.7 C)-99 F (37.2 C)] 99 F (37.2 C) (03/17 0505) Pulse Rate:  [80-92] 85 (03/17 0837) Resp:  [16-18] 16 (03/17 0505) BP: (74-117)/(38-67) 92/41 mmHg (03/17 0837) SpO2:  [97 %-100 %] 98 % (03/17 0505)  Last BM Date: 05/21/15   24-hour weight change: Weight change:   Weight trends: Filed Weights   05/16/15 2100 05/19/15 0444  Weight: 134 lb 9.6 oz (61.054 kg) 128 lb 14.4 oz (58.469 kg)    Intake/Output:  03/16 0701 - 03/17 0700 In: 480 [P.O.:480] Out: -      Physical Exam: BP 92/41 mmHg  Pulse 85  Temp(Src) 99 F (37.2 C) (Oral)  Resp 16  Ht 5\' 1"  (1.549 m)  Wt 128 lb 14.4 oz (58.469 kg)  BMI 24.37 kg/m2  SpO2 98%  Wt Readings from Last 3 Encounters:  05/19/15 128 lb 14.4 oz (58.469 kg)  04/24/15 131 lb (59.421 kg)  02/12/15 131 lb (59.421 kg)    General: Vital signs reviewed and noted.   Head: Normocephalic, atraumatic.  Eyes: conjunctivae/corneas clear.  EOM's intact.   Throat: normal  Neck:  normal   Lungs:  clear   Heart:  RR , S1   Mechanical S2   Abdomen:  Soft, non-tender, non-distended    Extremities: Right radial cath site is better ,  peticial rash.   bruised rt forarm  Neurologic: A&O X3, CN II - XII are grossly  intact.   Psych: Normal    Labs: BMET:  Recent Labs  05/20/15 0300  NA 139  K 3.5  CL 108  CO2 23  GLUCOSE 133*  BUN 9  CREATININE 0.79  CALCIUM 8.2*    Liver function tests: No results for input(s): AST, ALT, ALKPHOS, BILITOT, PROT, ALBUMIN in the last 72 hours. No results for input(s): LIPASE, AMYLASE in the last 72 hours.  CBC:  Recent Labs  05/20/15 0300 05/21/15 0300  WBC 7.2 7.4  HGB 11.4* 11.7*  HCT 35.5* 37.4  MCV 86.8 88.0  PLT 269 288    Cardiac Enzymes:  Recent Labs  05/19/15 2318  TROPONINI 7.81*    Coagulation Studies:  Recent Labs  05/20/15 0300 05/21/15 0300 05/22/15 0252  LABPROT 22.1* 19.1* 20.4*  INR 1.94* 1.60* 1.75*    Other: Invalid input(s): POCBNP No results for input(s): DDIMER in the last 72 hours. No results for input(s): HGBA1C in the last 72 hours. No results for input(s): CHOL, HDL, LDLCALC, TRIG, CHOLHDL in the last 72 hours. No results for input(s): TSH, T4TOTAL, T3FREE, THYROIDAB in the last 72 hours.  Invalid input(s): FREET3 No results for input(s): VITAMINB12, FOLATE, FERRITIN, TIBC, IRON, RETICCTPCT in the last 72 hours.   Other results:  EKG  ( personally reviewed )  - NSR , persistent ST elevation V2-V5    Medications:    Infusions: . nitroGLYCERIN Stopped (05/20/15 0517)    Scheduled Medications: . aspirin  81 mg Oral Daily  . carvedilol  6.25 mg Oral BID WC  . clopidogrel  75 mg Oral Q breakfast  . cycloSPORINE  1 drop Both Eyes BID  . loratadine  10 mg Oral Daily  . losartan  25 mg Oral Daily  . pantoprazole  40 mg Oral Daily  . potassium chloride SA  20 mEq Oral Daily  . rosuvastatin  20 mg Oral q1800  . senna  2 tablet Oral Daily  . sodium chloride flush  3 mL Intravenous Q12H  . sodium chloride flush  3 mL Intravenous Q12H  . Warfarin - Pharmacist Dosing Inpatient   Does not apply q1800   Echo 05/18/15 Study Conclusions  - Left ventricle: The cavity size was normal. There was  mild focal  basal hypertrophy of the septum. Systolic function was severely  reduced. The estimated ejection fraction was in the range of 25%  to 30%. There is akinesis of the mid-apicalanteroseptal,  anterolateral, inferoseptal, and apical myocardium. Doppler  parameters are consistent with abnormal left ventricular  relaxation (grade 1 diastolic dysfunction). - Aortic valve: Mildly thickened, mildly calcified leaflets. A  mechanical prosthesis was present and functioning normally. Peak  velocity (S): 253 cm/s.    Assessment/ Plan:   Principal Problem:   Acute ST elevation myocardial infarction (STEMI) of anterolateral wall (HCC) Active Problems:   H/O aortic valve replacement   Dyslipidemia   Essential hypertension   Hx Breast Cancer, IDC, Stage I   Long term current use of anticoagulant therapy   CAD S/P urgent PCI 05/16/15   Cardiomyopathy, ischemic   ST elevation (STEMI) myocardial infarction involving left anterior descending coronary artery ([place])  1.  Ant. Wall STEMI,   S/p stenting of LAD   I have personally reviewed the angiograms The distal LAD was open at cath yesterday but appeared to have systolic compression.    Perhaps she had some spasm of the distal LAD Will DC metoprolol and start Coreg. . Doing better Has persistently elevated ST segments  No further CP  Transfer to tell   2. AVR - stable , INR is 1.6 today .   Continue  coumadin ,   3. Significant LVD- EF 25-30% EF is now 40-45%  Distal ant. And apical akinesis  BP is a bit soft.   Will continue meds for now On coreg 6.25 BID Losartan 25 a day Will hold the Losartan if BP stays too low    Pt remain anxious   Anticipate DC tomorrow      Length of Stay: 6     Yazleemar Strassner, Wonda Cheng, MD  05/22/2015 8:55 AM    [place] [place],  [place], [place]  [address] Pager [phone] Phone: [phone]; Fax: [phone]   [facility]  [address] [place], [place]  [address] [phone]   Fax [phone]

## 2015-05-22 NOTE — Progress Notes (Signed)
CARDIAC REHAB PHASE I   PRE:  Rate/Rhythm: 76 SR    BP: sitting 104/48    SaO2: 100 RA  MODE:  Ambulation: 550 ft   POST:  Rate/Rhythm: 88 SR    BP: sitting 114/50     SaO2:   Tolerated well, no assist. No c/o, feeling better. Reviewed NTG/sx and ex at home.  [id number]   [place], ACSM 05/22/2015 11:56 AM

## 2015-05-22 NOTE — Care Management Important Message (Signed)
Important Message  Patient Details  Name: Amanda Wiggins MRN: [id number] Date of Birth: 18-May-1945   Medicare Important Message Given:  Yes    Amanda Wiggins [name] 05/22/2015, 2:30 PM

## 2015-05-22 NOTE — Telephone Encounter (Signed)
Pt still admitted.

## 2015-05-22 NOTE — Progress Notes (Signed)
ANTICOAGULATION CONSULT NOTE - Follow Up Consult  Pharmacy Consult for Coumadin Indication: Mechanical AVR  Allergies  Allergen Reactions  . Latex Hives  . Meperidine Hcl Nausea And Vomiting  . Sulfamethoxazole Nausea And Vomiting    REACTION: unspecified    Patient Measurements: Height: 5\' 1"  (154.9 cm) Weight: 128 lb 14.4 oz (58.469 kg) IBW/kg (Calculated) : 47.8  Vital Signs: Temp: 99 F (37.2 C) (03/17 0505) Temp Source: Oral (03/17 0505) BP: 92/41 mmHg (03/17 0837) Pulse Rate: 85 (03/17 0837)  Labs:  Recent Labs  05/19/15 2318 05/20/15 0300 05/21/15 0300 05/22/15 0252  HGB  --  11.4* 11.7*  --   HCT  --  35.5* 37.4  --   PLT  --  269 288  --   LABPROT  --  22.1* 19.1* 20.4*  INR  --  1.94* 1.60* 1.75*  CREATININE  --  0.79  --   --   TROPONINI 7.81*  --   --   --     Estimated Creatinine Clearance: 55.4 mL/min (by C-G formula based on Cr of 0.79).    Assessment:  AC: Coumadin for hx mechanical AVR - INR 1.94>1.6>1.75 today. -3/14 pt was to be d/c today but had CP>CODE STEMI. Stent placed to LAD -warfarin held 3/14, restarted 3/15 - PTA dosing: Home dose: 2.5mg  on Mon/Thur and 5mg  all other days   Goal of Therapy:  INR 2-3 Monitor platelets by anticoagulation protocol: Yes   Plan:  - Warfarin 5mg  tonight - Daily INR   Amanda Wiggins, PharmD, BCPS Clinical Staff Pharmacist Pager [phone]  Amanda Wiggins 05/22/2015,9:26 AM

## 2015-05-22 NOTE — Telephone Encounter (Signed)
Patient has follow-up appointment with Dr. Acie Fredrickson on 3/31

## 2015-05-23 ENCOUNTER — Other Ambulatory Visit: Payer: Self-pay | Admitting: Physician Assistant

## 2015-05-23 DIAGNOSIS — I5042 Chronic combined systolic (congestive) and diastolic (congestive) heart failure: Secondary | ICD-10-CM

## 2015-05-23 DIAGNOSIS — Z9861 Coronary angioplasty status: Secondary | ICD-10-CM

## 2015-05-23 DIAGNOSIS — I5022 Chronic systolic (congestive) heart failure: Secondary | ICD-10-CM

## 2015-05-23 DIAGNOSIS — I2510 Atherosclerotic heart disease of native coronary artery without angina pectoris: Secondary | ICD-10-CM

## 2015-05-23 LAB — PROTIME-INR
INR: 1.76 — AB (ref 0.00–1.49)
PROTHROMBIN TIME: 20.5 s — AB (ref 11.6–15.2)

## 2015-05-23 MED ORDER — NITROGLYCERIN 0.4 MG SL SUBL: 0.4000 mg | SUBLINGUAL_TABLET | SUBLINGUAL | Status: DC | PRN

## 2015-05-23 MED ORDER — WARFARIN SODIUM 5 MG PO TABS: 5.0000 mg | ORAL_TABLET | Freq: Every day | ORAL | Status: DC

## 2015-05-23 MED ORDER — ASPIRIN 81 MG PO CHEW: 81.0000 mg | CHEWABLE_TABLET | Freq: Every day | ORAL | Status: DC

## 2015-05-23 MED ORDER — ROSUVASTATIN CALCIUM 20 MG PO TABS: 20.0000 mg | ORAL_TABLET | Freq: Every day | ORAL | Status: DC

## 2015-05-23 MED ORDER — WARFARIN SODIUM 7.5 MG PO TABS
7.5000 mg | ORAL_TABLET | Freq: Once | ORAL | Status: AC
  Administered 2015-05-23: 7.5 mg via ORAL
  Filled 2015-05-23: qty 1

## 2015-05-23 MED ORDER — CLOPIDOGREL BISULFATE 75 MG PO TABS: 75.0000 mg | ORAL_TABLET | Freq: Every day | ORAL | Status: DC

## 2015-05-23 MED ORDER — PANTOPRAZOLE SODIUM 40 MG PO TBEC: 40.0000 mg | DELAYED_RELEASE_TABLET | Freq: Every day | ORAL | Status: DC

## 2015-05-23 MED ORDER — CARVEDILOL 6.25 MG PO TABS: 6.2500 mg | ORAL_TABLET | Freq: Two times a day (BID) | ORAL | Status: DC

## 2015-05-23 NOTE — Discharge Summary (Signed)
Discharge Summary    Patient ID: Amanda Wiggins,  MRN: [id number], DOB/AGE: 05/23/1945 70 y.o.  Admit date: 05/16/2015 Discharge date: 05/23/2015  Primary Care Provider: Nyoka Cowden Primary Cardiologist: Dr. Liam Rogers   Discharge Diagnoses    Principal Problem:   Acute ST elevation myocardial infarction (STEMI) of anterolateral wall [facility]) Active Problems:   CAD S/P urgent PCI 05/16/15   Cardiomyopathy, ischemic   Chronic systolic CHF (congestive heart failure) ([place])   Long term current use of anticoagulant therapy   H/O aortic valve replacement   Dyslipidemia   Essential hypertension   Hx Breast Cancer, IDC, Stage I   Allergies Allergies  Allergen Reactions  . Latex Hives  . Meperidine Hcl Nausea And Vomiting  . Sulfamethoxazole Nausea And Vomiting    REACTION: unspecified    Diagnostic Studies/Procedures    1. LHC 05/16/15 LAD mid 100%, distal 50% PCI: 2.0 x 20 mm Synergy DES to mid LAD, POBA to distal LAD  Mid LAD to Dist LAD lesion, 100% stenosed. Post intervention with a single Synergy DES 2.25 mm x 20 mm (2.3 mm), there is a 0% residual stenosis.  Dist LAD lesion, 50% stenosed beyond the stented segment. This was not adequately seen pre-stent placement. Post intervention - PTCA with stent balloon at low atmospheres inflation along with IC NTG & Verapamil, there is a 30% residual stenosis.  Mechanical aortic valve was not crossed. Fluoroscopically appeared to be functioning well. Both leaflets were not fully visualized, therefore recommend Echocardiogram to assess. Anterior ST elevation MI with thrombotic occlusion of the distal portion of the mid LAD into the distal LAD. Difficult lesion across with initial wire, requiring whisper wire. Several upstream inflations were required to be passed the balloon distally. Post balloon inflation revealed residual thrombus and 90% lesion distally, requiring stent placement to fully treat the lesion. Initial  slow reflow post stent placement improved after distal angioplasty, IC NTG and verapamil.     2. LHC 05/19/15  Dist LAD-2 lesion, 99% stenosed.  Dist LAD-1 lesion, 70% stenosed. Post intervention, there is a 0% residual stenosis.  Widely patent stent in the mid LAD with evidence for diffuse narrowing in a very small distal LAD with muscle bridging and focal narrowing up to 99/100%.  No significant obstructive disease as noted previously in the left circumflex and the large dominant RCA.  Successful percutaneous coronary intervention to the distal LAD treated initially with up to 6 minute inflations with a 1.520 mm balloon, and ultimate stenting with a 2.023 mm mini vision stent with these entire region of stenoses being reduced to 0% and resolution of prior systolic bridging. During the procedure the patient received numerous doses of intracoronary verapamil and nitroglycerin was started on an intravenous nitroglycerin drip.      1. Echo 05/18/15 - Left ventricle: The cavity size was normal. There was mild focal basal hypertrophy of the septum. Systolic function was severely  reduced. The estimated ejection fraction was in the range of 25% to 30%. There is akinesis of the mid-apicalanteroseptal,   anterolateral, inferoseptal, and apical myocardium. Doppler parameters are consistent with abnormal left ventricular relaxation (grade 1 diastolic dysfunction). - Aortic valve: Mildly thickened, mildly calcified leaflets. A mechanical prosthesis was present and functioning normally. Peak  velocity (S): 253 cm/s.  2. Echo 05/21/15 - Left ventricle: The cavity size was normal. Wall thickness was increased in a pattern of moderate LVH. Systolic function was mildly to moderately reduced. The estimated ejection fraction was in the  range of 40% to 45%. There is mid to distal anterior,  apical and inferoapical akinesis, suggestive of LAD territory infarct. Definity contrast was administered -the apex  opacifies and no mural thrombus was noted. There is late lipid microsphere uptake in the distal anterior wall and apex, suggesting possible viability of these walls. Doppler parameters are consistent with abnormal left ventricular relaxation (grade 1 diastolic dysfunction). The E/e&' ratio is >15, suggesting elevated LV filling pressure. - Aortic valve: Mechanical aortic valve. No obstruction - AT<100, DVI >0.25. There was trivial cleaning regurgitation. Valve area (VTI): 0.23 cm^2. Valve area (Vmax): 0.26 cm^2. Valve area (Vmean): 0.25 cm^2. - Mitral valve: Calcified annulus. Mildly thickened leaflets . There was trivial regurgitation. - Left atrium: The atrium was normal in size. - Tricuspid valve: There was mild regurgitation. - Pulmonary arteries: PA peak pressure: 25 mm Hg (S). - Inferior vena cava: The vessel was normal in size. The respirophasic diameter changes were in the normal range (>= 50%), consistent with normal central venous pressure.  Impressions: - Compared to the echo on 05/18/15, the EF has improved to 40-45%, there is no mural thrombus. Late definity uptake is noted in the anterior and apical myocardium, suggetive of viability, there is diastolic dysfunction with elevated LV filling pressure. The mechanical aortic valve is functioning normally without obstruction.  _____________   History of Present Illness     Amanda Wiggins is a 70 y.o. female with a history of mechanical AVR in 2003, chronic warfarin anticoagulation, HTN, HL, breast CA, GERD. She presented to the emergency room via [facility] with ongoing chest discomfort and ST elevation in V1-V3. She was brought directly to the cardiac catheterization lab.  Hospital Course     Consultants:  None    She was brought urgently to the cardiac cath lab for anterior STEMI.  LHC demonstrated an occluded LAD which was treated with a DES. EF was severely reduced at 25-30%. CHF medications were adjusted. She did  have a hematoma of her right wrist post catheterization. She was eventually transferred to telemetry and was approaching discharge. On the date of discharge, she developed recurrent chest pain and ST elevation. She was brought back to the cardiac catheterization lab. LAD stent was patent. She had a very small distal vessel that demonstrated collapse versus spasm. This was treated with a bare metal stent. Plan is to continue aspirin, Plavix and Coumadin for 30 days. She will then DC Aspirin. Repeat echocardiogram demonstrated improved LV function with an EF of 40-45%. AVR was stable. Blood pressure has been somewhat soft. Losartan was held. At discharge, she will continue Coreg 6.25 mg twice a day, aspirin, Plavix and Crestor 20 mg daily. Losartan can be considered at follow-up. She has follow-up with the Coumadin clinic next week. INR today is 1.76. Pharmacy has been asked to dose her again prior to discharge.  Crestor increased from 10 to 20 mg QD this admit.  She will need FU Lipids and LFTs in 6 weeks.  K+ DC'd at DC.  Will need BMET at FU with Coumadin Clinic next week.  She has follow-up with Dr. Acie Fredrickson in the next 2 weeks. She will need a TCM visit.  Patient seen by Dr. Kirk Ruths and felt to be ready for DC to home.   _____________  Discharge Vitals Blood pressure 102/44, pulse 98, temperature 98.4 F (36.9 C), temperature source Oral, resp. rate 18, height 5\' 1"  (1.549 m), weight 128 lb 14.4 oz (58.469 kg), SpO2  98 %.  Filed Weights   05/16/15 2100 05/19/15 0444  Weight: 134 lb 9.6 oz (61.054 kg) 128 lb 14.4 oz (58.469 kg)    Labs & Radiologic Studies     CBC  Recent Labs  05/21/15 0300  WBC 7.4  HGB 11.7*  HCT 37.4  MCV 88.0  PLT 123XX123   Basic Metabolic Panel No results for input(s): NA, K, CL, CO2, GLUCOSE, BUN, CREATININE, CALCIUM, MG, PHOS in the last 72 hours. Liver Function Tests No results for input(s): AST, ALT, ALKPHOS, BILITOT, PROT, ALBUMIN in the last 72 hours. No  results for input(s): LIPASE, AMYLASE in the last 72 hours. Cardiac Enzymes No results for input(s): CKTOTAL, CKMB, CKMBINDEX, TROPONINI in the last 72 hours. BNP Invalid input(s): POCBNP D-Dimer No results for input(s): DDIMER in the last 72 hours. Hemoglobin A1C No results for input(s): HGBA1C in the last 72 hours. Fasting Lipid Panel No results for input(s): CHOL, HDL, LDLCALC, TRIG, CHOLHDL, LDLDIRECT in the last 72 hours. Thyroid Function Tests No results for input(s): TSH, T4TOTAL, T3FREE, THYROIDAB in the last 72 hours.  Invalid input(s): FREET3  No results found.  Disposition   Pt is being discharged home today in good condition.  Follow-up Plans & Appointments    Follow-up Information    Follow up with Nahser, Wonda Cheng, MD On 06/05/2015.   Specialty:  Cardiology   Why:  2:15 pm   Contact information:   [address] [address] [phone]       Follow up with [facility] On 05/27/2015.   Why:  Coumadin Clinic at 10:15 am   Contact information:   [place] [phone]     Discharge Instructions    AMB Referral to Phase II Cardiac Rehab    Complete by:  As directed   Diagnosis:  Myocardial Infarction     Amb Referral to Cardiac Rehabilitation    Complete by:  As directed   Diagnosis:   PCI Myocardial Infarction       Diet - low sodium heart healthy    Complete by:  As directed      Discharge wound care:    Complete by:  As directed   Call [phone] for any swelling, bleeding, bruising, fever.     Driving Restrictions    Complete by:  As directed   No driving for 3 days.     Increase activity slowly    Complete by:  As directed      Lifting restrictions    Complete by:  As directed   No lifting over 5 lbs for 2 weeks.     Sexual Activity Restrictions    Complete by:  As directed   None for 2 weeks.           Discharge  Medications   Current Discharge Medication List    START taking these medications   Details  aspirin 81 MG chewable tablet Chew 1 tablet (81 mg total) by mouth daily.    carvedilol (COREG) 6.25 MG tablet Take 1 tablet (6.25 mg total) by mouth 2 (two) times daily with a meal. Qty: 60 tablet, Refills: 11    clopidogrel (PLAVIX) 75 MG tablet Take 1 tablet (75 mg total) by mouth daily with breakfast. Qty: 90 tablet, Refills: 3    nitroGLYCERIN (NITROSTAT) 0.4 MG SL tablet Place 1 tablet (0.4 mg total) under the tongue every 5 (five)  minutes as needed for chest pain. Qty: 25 tablet, Refills: 2    pantoprazole (PROTONIX) 40 MG tablet Take 1 tablet (40 mg total) by mouth daily. Qty: 30 tablet, Refills: 6      CONTINUE these medications which have CHANGED   Details  rosuvastatin (CRESTOR) 20 MG tablet Take 1 tablet (20 mg total) by mouth daily at 6 PM. Qty: 30 tablet, Refills: 11    warfarin (COUMADIN) 5 MG tablet Take 1 tablet (5 mg total) by mouth daily at 6 PM.      CONTINUE these medications which have NOT CHANGED   Details  BIOTIN PO Take 1 tablet by mouth every morning.     Calcium Carbonate-Vitamin D (CALCIUM 600+D) 600-400 MG-UNIT per tablet Take 1 tablet by mouth 2 (two) times daily.      cycloSPORINE (RESTASIS) 0.05 % ophthalmic emulsion Place 1 drop into both eyes 2 (two) times daily as needed.     Glucosamine-Chondroit-Vit C-Mn (GLUCOSAMINE CHONDR 1500 COMPLX) CAPS Take 1 capsule by mouth every morning.     loratadine (CLARITIN) 10 MG tablet Take 10 mg by mouth daily.      Multiple Vitamin (MULTIVITAMIN) capsule Take 1 capsule by mouth daily.      senna (SENOKOT) 8.6 MG tablet Take 2 tablets by mouth daily.      temazepam (RESTORIL) 30 MG capsule Take 1 capsule (30 mg total) by mouth at bedtime as needed. Qty: 90 capsule, Refills: 2   Associated Diagnoses: Insomnia due to medical condition      STOP taking these medications     chlorthalidone (HYGROTON) 25 MG  tablet      omeprazole (PRILOSEC) 20 MG capsule      potassium chloride SA (KLOR-CON M20) 20 MEQ tablet      SUMAtriptan (IMITREX) 25 MG tablet      verapamil (CALAN-SR) 180 MG CR tablet      ALPRAZolam (XANAX) 0.25 MG tablet          Aspirin prescribed at discharge?  Yes High Intensity Statin Prescribed? (Lipitor 40-80mg  or Crestor 20-40mg ): Yes Beta Blocker Prescribed? Yes For EF 45% or less, Was ACEI/ARB Prescribed? No: BP Soft - Consider adding at FU visit ADP Receptor Inhibitor Prescribed? (i.e. Plavix etc.-Includes Medically Managed Patients): Yes Plavix For EF <40%, Aldosterone Inhibitor Prescribed? No: EF 40-45% at DC Was EF assessed during THIS hospitalization? Yes Was Cardiac Rehab II ordered? (Included Medically managed Patients): Yes   Outstanding Labs/Studies   1. INR 29-May-2015 2. BMET 05-29-15 3. Lipids and LFTs in 6 weeks.   Duration of Discharge Encounter   Greater than 30 minutes including physician time.  Signed, Richardson Dopp, PA-C  05/23/2015, 1:23 PM

## 2015-05-23 NOTE — Progress Notes (Signed)
ANTICOAGULATION CONSULT NOTE - Follow Up Consult  Pharmacy Consult for Coumadin Indication: Mechanical AVR  Allergies  Allergen Reactions  . Latex Hives  . Meperidine Hcl Nausea And Vomiting  . Sulfamethoxazole Nausea And Vomiting    REACTION: unspecified    Patient Measurements: Height: 5\' 1"  (154.9 cm) Weight: 128 lb 14.4 oz (58.469 kg) IBW/kg (Calculated) : 47.8  Vital Signs: Temp: 98.4 F (36.9 C) (03/18 0403) Temp Source: Oral (03/18 0403) BP: 102/44 mmHg (03/18 0850) Pulse Rate: 98 (03/18 0850)  Labs:  Recent Labs  05/21/15 0300 05/22/15 0252 05/23/15 0331  HGB 11.7*  --   --   HCT 37.4  --   --   PLT 288  --   --   LABPROT 19.1* 20.4* 20.5*  INR 1.60* 1.75* 1.76*    Estimated Creatinine Clearance: 55.4 mL/min (by C-G formula based on Cr of 0.79).    Assessment:  AC: Coumadin for hx mechanical AVR - INR 1.94>1.6>1.75>1.76 today. -3/14 pt was to be d/c today but had CP>CODE STEMI. Stent placed to LAD - warfarin held 3/14, restarted 3/15 - PTA dosing: Home dose: 2.5mg  on Mon/Thur and 5mg  all other days (INR 1.8 on admit)   Goal of Therapy:  INR 2-3 Monitor platelets by anticoagulation protocol: Yes   Plan:  - Warfarin 7.5 mg tonight x 1 then 5mg  daily. - Daily INR   Rory Xiang S. Alford Highland, PharmD, BCPS Clinical Staff Pharmacist Pager [phone]  Eilene Ghazi Stillinger 05/23/2015,10:23 AM

## 2015-05-23 NOTE — Progress Notes (Signed)
CARDIAC REHAB PHASE I   PRE:  Rate/Rhythm: 79 SR  BP:   Sitting: 96/50     SaO2: 100% RA  MODE:  Ambulation: 350 ft   POST:  Rate/Rhythm: 85 SR  BP:   Sitting: 95/64     SaO2: 99%RA [phone]  Pt ambulated 35ft independently without any complications. Returned to recliner with VSS. Pt did mention mild discomfort in the shoulder blade region.  Pt is also nervous about d/c due to cardiac event.   Amanda Wiggins D Ima Hafner,MS,ACSM-RCEP 05/23/2015 10:49 AM

## 2015-05-23 NOTE — Progress Notes (Signed)
Primary cardiologist:  Dr. Liam Rogers   Subjective:    Doing well.  No chest pain. Mild dyspnea after walk yesterday.  INR 1.76 today.   Objective:   Temp:  [98.3 F (36.8 C)-98.9 F (37.2 C)] 98.4 F (36.9 C) (03/18 0403) Pulse Rate:  [72-87] 87 (03/18 0403) Resp:  [18] 18 (03/18 0403) BP: (92-117)/(38-56) 110/56 mmHg (03/18 0403) SpO2:  [98 %-100 %] 98 % (03/18 0403) Last BM Date: 05/22/15  Filed Weights   05/16/15 2100 05/19/15 0444  Weight: 134 lb 9.6 oz (61.054 kg) 128 lb 14.4 oz (58.469 kg)    Intake/Output Summary (Last 24 hours) at 05/23/15 0727 Last data filed at 05/22/15 1700  Gross per 24 hour  Intake    580 ml  Output      0 ml  Net    580 ml    Telemetry:  NSR, HR 70-80s  Exam:  General:  NAD  HEENT:  Neck without JVD Lungs:  CTA bilat, no rales Cardiac:  S1, mechanical S2, RRR, no murmur; R wrist with ecchymosis and small hematoma; R groin without hematoma or bruit Abdomen:  Soft, NT MSK:  No deformity Neuro:  No focal deficit Psych:  Normal affect   Lab Results:  Basic Metabolic Panel:  Recent Labs Lab 05/16/15 1911  05/16/15 1932 05/17/15 0304 05/20/15 0300  NA 141  < > 142 139 139  K 4.1  < > 3.3* 3.7 3.5  CL 106  < > 106 107 108  CO2 23  --   --  19* 23  GLUCOSE 130*  < > 145* 180* 133*  BUN 21*  < > 22* 14 9  CREATININE 0.87  < > 0.70 0.79 0.79  CALCIUM 9.1  --   --  8.2* 8.2*  < > = values in this interval not displayed.  Liver Function Tests: No results for input(s): AST, ALT, ALKPHOS, BILITOT, PROT, ALBUMIN in the last 168 hours.  CBC:  Recent Labs Lab 05/19/15 0430 05/20/15 0300 05/21/15 0300  WBC 10.0 7.2 7.4  HGB 13.4 11.4* 11.7*  HCT 41.9 35.5* 37.4  MCV 87.1 86.8 88.0  PLT 274 269 288    Cardiac Enzymes:  Recent Labs Lab 05/17/15 0955 05/17/15 1445 05/19/15 2318  TROPONINI 35.10* 24.82* 7.81*    BNP: No results for input(s): PROBNP in the last 8760 hours.  Coagulation:  Recent Labs Lab  05/21/15 0300 05/22/15 0252 05/23/15 0331  INR 1.60* 1.75* 1.76*     Medications:   Scheduled Medications: . aspirin  81 mg Oral Daily  . carvedilol  6.25 mg Oral BID WC  . clopidogrel  75 mg Oral Q breakfast  . cycloSPORINE  1 drop Both Eyes BID  . loratadine  10 mg Oral Daily  . losartan  25 mg Oral Daily  . pantoprazole  40 mg Oral Daily  . potassium chloride SA  20 mEq Oral Daily  . rosuvastatin  20 mg Oral q1800  . senna  2 tablet Oral Daily  . sodium chloride flush  3 mL Intravenous Q12H  . sodium chloride flush  3 mL Intravenous Q12H  . Warfarin - Pharmacist Dosing Inpatient   Does not apply q1800     Infusions: . nitroGLYCERIN Stopped (05/20/15 0517)     PRN Medications:  sodium chloride, sodium chloride, acetaminophen, ALPRAZolam, morphine injection, nitroGLYCERIN, ondansetron (ZOFRAN) IV, sodium chloride, sodium chloride flush, sodium chloride flush, temazepam   Assessment:   70 yo female  with hx of mechanical AVR admitted 3/11 with anterior STEMI.  LHC with occluded LAD treated with DES.   EF was severely reduced on Echo with EF 25-30%.  Prior to DC, she developed recurrent angina.  LHC demonstrated patent LAD stent but the very small distal vessel demonstrated collapse (?Spasm) and was stented with a BMS.  She has had persistently elevated ST segments.  Echo demonstrated improved LVF with EF 40-45%.    Principal Problem:   Acute ST elevation myocardial infarction (STEMI) of anterolateral wall ([facility]) Active Problems:   CAD S/P urgent PCI 05/16/15   Cardiomyopathy, ischemic   Chronic systolic CHF (congestive heart failure) ([place])   Long term current use of anticoagulant therapy   H/O aortic valve replacement   Dyslipidemia   Essential hypertension   Hx Breast Cancer, IDC, Stage I    Plan/Discussion:    1. CAD - s/p Ant STEMI tx with DES to LAD c/b re-infarct 2/2 systolic collapse of distal LAD (possible spasm) tx with BMS.  Overall, improved.  No further  chest pain.   BP has been s/w soft and Losartan held yesterday.  Anticipate DC today after seen by attending MD.    -  DC on ASA, Plavix, Coreg 6.25 bid, Crestor 20  -  Plan to DC ASA 30 days after last PCI.  -  Hold off on starting Losartan - consider at FU.    -  Will need TCM visit in 7-14 days (has appt with Dr. Acie Fredrickson 3/31)  2. Ischemic CM - EF improved to 40-45%.  Continue beta-blocker.  Try to add angiotensin receptor blocker (Losartan 25 QD) at FU visit in 2 weeks.   3. Chronic Systolic CHF - Volume stable.  Not currently on diuretics.  Will need to stress the importance of daily weights and when to call.  4. S/p Mechanical AVR - INR only 1.76 today.  Will ask pharmacy to dose Coumadin prior to leaving.  She has CVRR clinic appt Wed, 3/22.  5. HL - Continue statin.  Crestor dose increased 10 >> 20.  Check Lipids and LFTs in 6 weeks.    [place].  Attending MD to see today.  Richardson Dopp, PA-C   05/23/2015 7:27 AM Pager [phone]   As above, patient seen and examined. She has some residual pain in her left scapula that improves with ambulation. No pain similar to infarct pain. Plan discharge today on medications including aspirin, Plavix, carvedilol and Crestor. We will hold on Cozaar his blood pressure will not tolerate this point. This can be reconsidered at follow-up outpatient visit. Follow-up Dr Acie Fredrickson. > 30 min PA and physician time D2 Kirk Ruths

## 2015-05-23 NOTE — Discharge Instructions (Signed)
Your medications had to be adjusted when your discharge was cancelled several days ago. There may be duplicate prescriptions sent to your pharmacy. The medications you should be taking include: Plavix 75 mg once daily. Aspirin 81 mg once daily. Carvedilol (Coreg) 6.25 mg twice daily. Crestor (Rosuvastatin) 20 mg once daily. Nitroglycerin 0.4 mg as needed for chest pain. Coumadin 5 mg once daily until you are seen in the [facility] this week. Protonix (Pantoprazole) 40 mg once daily.  Do not take: Chlorthalidone Potassium chloride Imitrex Verapamil Prilosec (Omeprazole)  Prescriptions were sent to your pharmacy. I have also given you handwritten prescriptions in case there was a problem with the original prescriptions sent earlier this week.  You will go to [facility] on Wednesday, May 27, 2015. You will need labs done that day as well (BMET).    Cardiomyopathy Cardiomyopathy is a long-term (chronic) disease of the heart muscle (myocardium). Over time, the heart becomes abnormally large, thick, or stiff. This makes it harder for the heart to pump blood and can lead to heart failure. There are several types of cardiomyopathy:  Dilated cardiomyopathy. This type causes the ventricles become large and weak.  Hypertrophic cardiomyopathy. This type causes the heart muscle to thicken.  Restrictive cardiomyopathy. This type causes the heart muscle to become rigid and less elastic.  Ischemic cardiomyopathy. This type involves narrowing arteries that cause the walls of the heart get thinner.  Peripartum cardiomyopathy. This type occurs during pregnancy or shortly after pregnancy. CAUSES The cause of cardiomyopathy is often not known. In some cases, it is passed down (inherited) from a family member who also had cardiomyopathy. The disease may develop as a complication of another medical condition. These conditions can include:  Diabetes.  High blood pressure.  Viral  infection of the heart.  Heart attack.  Coronary heart disease. RISK FACTORS You may be more likely to develop cardiomyopathy if you:  Have a family history of cardiomyopathy or other heart problems.  Are overweight or obese.  Use illegal drugs.  Abuse alcohol.  Have diabetes.  Have another disease that can cause cardiomyopathy as a complication. SIGNS AND SYMPTOMS Often, cardiomyopathy has no signs or symptoms. If you do have symptoms, they may include:  Shortness of breath, especially during activity.  Fatigue.  An irregular heartbeat (arrhythmia).  Dizziness, light-headedness, or fainting.  Chest pain.  Swelling in the lower leg or ankle. DIAGNOSIS Your health care provider may suspect cardiomyopathy based on your symptoms and medical history. Your health care provider will also do a physical exam. Other tests done may include:  Blood tests.  Imaging studies of your heart. These may be done using:  X-rays to check if your heart is enlarged.  Echocardiogram to show the size of your heart and how well it pumps.  MRI.  A test to record the electrical activity of your heart (electrocardiogram or ECG).  A test in which you wear a portable device (event monitor) to record your heart's electrical activity while you go about your day.  A test to monitor your heart's activity while you exercise (stress test).   A procedure to check the blood pressure and blood flow in your heart(cardiac catheterization).  Injection of dye into your arteries before imaging studies are taken (angiogram).  Removal of a sample of heart tissue (biopsy). The sample is examined for problems. TREATMENT Treatment depends on the type of cardiomyopathy you have and the severity of your symptoms. If you are not having any  symptoms, you might not need treatment. If you need treatment, it may include:  Lifestyle changes.  Quit smoking, if you smoke.  Maintain a healthy weight. Lose  weight if directed by your health care provider.  Eat a healthy diet. Include plenty of fruits, vegetables, and whole grains.  Get regular exercise. Ask your health care provider to suggest some activities that are good for you.  Medicine. You may need to take medicine to:  Lower your blood pressure.  Slow down your heart rate.  Keep your heart beating in a steady rhythm.  Clear excess fluids from your body.  Prevent blood clots.  Surgery. You may need surgery to:  Repair a defect.  Remove thickened tissue.  Implant a device to treat serious heart rhythm problems (implantable cardioverter-defibrillator or ICD).  Replace your heart (heart transplant) if all other treatments have failed (end stage). HOME CARE INSTRUCTIONS  Take medicines only as directed by your health care provider.  Eat a heart-healthy diet. Work with your health care provider or a registered dietitian to learn about healthy eating options.  Maintain a healthy weight and stay physically active.  Do not use any tobacco products, including cigarettes, chewing tobacco, or electronic cigarettes. If you need help quitting, ask your health care provider.  Work closely with your health care provider to manage chronic conditions, such as diabetes and high blood pressure.  Limit alcohol intake to no more than one drink per day for nonpregnant women and no more than two drinks per day for men. One drink equals 12 ounces of beer, 5 ounces of wine, or 1 ounces of hard liquor.  Try to get at least 7 hours of sleep each night.  Find ways to manage stress.  Keep all follow-up visits as directed by your health care provider. This is important. SEEK MEDICAL CARE IF:  Your symptoms get worse, even after treatment.  You have new symptoms. SEEK IMMEDIATE MEDICAL CARE IF:  You have severe chest pain.  You have shortness of breath.  You cough up pink, bubbly material.  You have sudden sweating.  You feel  nauseous and vomit.  You suddenly become light-headed or dizzy.  You feel your heart beating very fast.  It feels like your heart is skipping beats. These symptoms may represent a serious problem that is an emergency. Do not wait to see if the symptoms will go away. Get medical help right away. Call your local emergency services (911 in the [place] Do not drive yourself to the hospital.   This information is not intended to replace advice given to you by your health care provider. Make sure you discuss any questions you have with your health care provider.   Document Released: 05/06/2004 Document Revised: 03/14/2014 Document Reviewed: 08/01/2013 Elsevier Interactive Patient Education 2016 [employer]. Heart Attack A heart attack (myocardial infarction, MI) causes damage to your heart that cannot be fixed. A heart attack can happen when a heart (coronary) artery becomes blocked or narrowed. This cuts off the blood supply and oxygen to your heart. When one or more of your coronary arteries becomes blocked, that area of your heart begins to die. This causes the pain you feel during a heart attack. Heart attack pain can also occur in one part of the body but be felt in another part of the body (referred pain). You may feel referred heart attack pain in your left arm, neck, or jaw. Pain may even be felt in the right arm. CAUSES  Many conditions can cause a heart attack. These include:   Atherosclerosis. This is when a fatty substance (plaque) gradually builds up in the arteries. This buildup can block or reduce the blood supply to one or more of the heart arteries.  A blood clot. A blood clot can develop suddenly when plaque breaks up (ruptures) within a heart artery. A blood clot can block the heart artery, which prevents blood flow to the heart.   Severe tightening (spasm) of the heart artery. This cuts off blood flow through the artery.  RISK FACTORS People at risk for heart attack usually  have one or more of the following risk factors:   High blood pressure (hypertension).  High cholesterol.  Smoking.  Being female.  Being overweight or obese.  Older aged.   A family history of heart disease.  Lack of exercise.  Diabetes.  Stress.  Drinking too much alcohol.  Using illegal street drugs, such as cocaine and methamphetamines. SYMPTOMS  Heart attack symptoms can vary from person to person. Symptoms depend on factors like gender and age.   In both men and women, heart attack symptoms can include the following:   Chest pain. This may feel like crushing, squeezing, or a feeling of pressure.  Shortness of breath.  Heartburn or indigestion with or without vomiting, shortness of breath, or sweating.  Sudden cold sweats.  Sudden light-headedness.  Upper back pain.   Women can have unique heart attack symptoms, such as:   Unexplained feelings of nervousness or anxiety.  Discomfort between the shoulder blades or upper back.  Tingling in the hands and arms.   Older people (of both genders) can have subtle heart attack symptoms, such as:   Sweating.  Shortness of breath.  General tiredness or not feeling well.  DIAGNOSIS  Diagnosing a heart attack involves several tests. They include:   An assessment of your vital signs. This includes checking your:  Heart rhythm.  Blood pressure.  Breathing rate.  Oxygen level.   An ECG (electrocardiogram) to measure the electrical activity of your heart.  Blood tests called cardiac markers. In these tests, blood is drawn at scheduled times to check for the specific proteins or enzymes released by damaged heart muscle.  A chest X-ray.  An echocardiogram to evaluate heart motion and blood flow.  Coronary angiography to look at the heart arteries.  TREATMENT  Treatment for a heart attack may include:   Medicine that breaks up or dissolves blood clots in the heart  artery.  Angioplasty.  Cardiac stent placement.  Intra-aortic balloon pump therapy (IABP).  Open heart surgery (coronary artery bypass graft, CABG). HOME CARE INSTRUCTIONS  Take medicines only as directed by your health care provider. You may need to take medicine after a heart attack to:   Keep your blood from clotting too easily.  Control your blood pressure.  Lower your cholesterol.  Control abnormal heart rhythms.   Do not take the following medicines unless your health care provider approves:  Nonsteroidal anti-inflammatory drugs (NSAIDs), such as ibuprofen, naproxen, or celecoxib.  Vitamin supplements that contain vitamin A, vitamin E, or both.  Hormone replacement therapy that contains estrogen with or without progestin.  Make lifestyle changes as directed by your health care provider. These may include:   Quitting smoking, if you smoke.  Getting regular exercise. Ask your health care provider to suggest some activities that are safe for you.  Eating a heart-healthy diet. A registered dietitian can help you learn healthy eating  options.  Maintaining a healthy weight.   Managing diabetes, if necessary.  Reducing stress.  Limiting how much alcohol you drink. SEEK IMMEDIATE MEDICAL CARE IF:   You have sudden, unexplained chest discomfort.  You have sudden, unexplained discomfort in your arms, back, neck, or jaw.  You have shortness of breath at any time.  You suddenly start to sweat or your skin gets clammy.  You feel nauseous or vomit.  You suddenly feel light-headed or dizzy.  Your heart begins to beat fast or feels like it is skipping beats. These symptoms may represent a serious problem that is an emergency. Do not wait to see if the symptoms will go away. Get medical help right away. Call your local emergency services (911 in the [place] Do not drive yourself to the hospital.   This information is not intended to replace advice given to you by  your health care provider. Make sure you discuss any questions you have with your health care provider.   Document Released: 02/21/2005 Document Revised: 03/14/2014 Document Reviewed: 04/26/2013 Elsevier Interactive Patient Education 2016 [employer] Coronary Angiogram With Stent, Care After Refer to this sheet in the next few weeks. These instructions provide you with information about caring for yourself after your procedure. Your health care provider may also give you more specific instructions. Your treatment has been planned according to current medical practices, but problems sometimes occur. Call your health care provider if you have any problems or questions after your procedure. WHAT TO EXPECT AFTER THE PROCEDURE  After your procedure, it is typical to have the following:  Bruising at the catheter insertion site that usually fades within 1-2 weeks.  Blood collecting in the tissue (hematoma) that may be painful to the touch. It should usually decrease in size and tenderness within 1-2 weeks. HOME CARE INSTRUCTIONS  Take medicines only as directed by your health care provider. Blood thinners may be prescribed after your procedure to improve blood flow through the stent.  You may shower 24-48 hours after the procedure or as directed by your health care provider. Remove the bandage (dressing) and gently wash the catheter insertion site with plain soap and water. Pat the area dry with a clean towel. Do not rub the site, because this may cause bleeding.  Do not take baths, swim, or use a hot tub until your health care provider approves.  Check your catheter insertion site every day for redness, swelling, or drainage.  Do not apply powder or lotion to the site.  Do not lift over 10 lb (4.5 kg) for 5 days after your procedure or as directed by your health care provider.  Ask your health care provider when it is okay to:  Return to work or school.  Resume usual physical activities or  sports.  Resume sexual activity.  Eat a heart-healthy diet. This should include plenty of fresh fruits and vegetables. Meat should be lean cuts. Avoid the following types of food:  Food that is high in salt.  Canned or highly processed food.  Food that is high in saturated fat or sugar.  Fried food.  Make any other lifestyle changes as recommended by your health care provider. These may include:  Not using any tobacco products, including cigarettes, chewing tobacco, or electronic cigarettes.If you need help quitting, ask your health care provider.  Managing your weight.  Getting regular exercise.  Managing your blood pressure.  Limiting your alcohol intake.  Managing other health problems, such as diabetes.  If  you need an MRI after your heart stent has been placed, be sure to tell the health care provider who orders the MRI that you have a heart stent.  Keep all follow-up visits as directed by your health care provider. This is important. SEEK MEDICAL CARE IF:  You have a fever.  You have chills.  You have increased bleeding from the catheter insertion site. Hold pressure on the site. SEEK IMMEDIATE MEDICAL CARE IF:  You develop chest pain or shortness of breath, feel faint, or pass out.  You have unusual pain at the catheter insertion site.  You have redness, warmth, or swelling at the catheter insertion site.  You have drainage (other than a small amount of blood on the dressing) from the catheter insertion site.  The catheter insertion site is bleeding, and the bleeding does not stop after 30 minutes of holding steady pressure on the site.  You develop bleeding from any other place, such as from your rectum. There may be bright red blood in your urine or stool, or it may appear as black, tarry stool.   This information is not intended to replace advice given to you by your health care provider. Make sure you discuss any questions you have with your health care  provider.   Document Released: 09/10/2004 Document Revised: 03/14/2014 Document Reviewed: 07/16/2012 Elsevier Interactive Patient Education [employer].

## 2015-05-25 ENCOUNTER — Telehealth: Payer: Self-pay

## 2015-05-25 ENCOUNTER — Telehealth: Payer: Self-pay | Admitting: Cardiovascular Disease

## 2015-05-25 NOTE — Telephone Encounter (Signed)
Have her continue as she is and call us back if she has shortness of breath

## 2015-05-25 NOTE — Telephone Encounter (Signed)
Spoke with patient and advised her to continue current treatment plan until follow-up appointment with Dr. Acie Fredrickson on 3/31.  I advised her to call back with SOB, swelling, or any other concerns. She verbalized understanding and agreement and thanked me for the call.

## 2015-05-25 NOTE — Telephone Encounter (Signed)
New Message:  Pt says she was discharged from the hospital on Saturday. She said Dr Acie Fredrickson told her to call you today about appointments.

## 2015-05-25 NOTE — Telephone Encounter (Signed)
Spoke with patient who called to make certain she is aware of all scheduled follow-up appointments and to ask why potassium supplement was stopped by Dr. Stanford Breed.  I reviewed her chart and see the note to stop Kdur but I do not see an explanation.  I advised her that I will forward to Dr. Acie Fredrickson for review and call her back.  She verbalized understanding and agreement and thanked me for the call.

## 2015-05-25 NOTE — Telephone Encounter (Signed)
-----   Message from Liliane Shi, [place] sent at 05/23/2015  1:20 PM EDT ----- Regarding: need TCM phone call TCM visit. Already has appointment 3/31. Needs phone call. Richardson Dopp, PA-C   05/23/2015 1:20 PM

## 2015-05-25 NOTE — Telephone Encounter (Signed)
Patient contacted regarding discharge from [facility]   Patient understands to follow up with provider ? 3/31 with Dr. Acie Fredrickson Patient understands discharge instructions? Yes  Patient understands medications and regiment? Yes  Patient understands to bring all medications to this visit? Yes

## 2015-05-26 ENCOUNTER — Ambulatory Visit: Payer: Medicare Other | Admitting: Physician Assistant

## 2015-05-27 ENCOUNTER — Other Ambulatory Visit (INDEPENDENT_AMBULATORY_CARE_PROVIDER_SITE_OTHER): Payer: Medicare Other | Admitting: *Deleted

## 2015-05-27 ENCOUNTER — Ambulatory Visit (INDEPENDENT_AMBULATORY_CARE_PROVIDER_SITE_OTHER): Payer: Medicare Other | Admitting: *Deleted

## 2015-05-27 DIAGNOSIS — I5022 Chronic systolic (congestive) heart failure: Secondary | ICD-10-CM | POA: Diagnosis not present

## 2015-05-27 DIAGNOSIS — Z954 Presence of other heart-valve replacement: Secondary | ICD-10-CM

## 2015-05-27 DIAGNOSIS — I359 Nonrheumatic aortic valve disorder, unspecified: Secondary | ICD-10-CM | POA: Diagnosis not present

## 2015-05-27 DIAGNOSIS — Z7901 Long term (current) use of anticoagulants: Secondary | ICD-10-CM

## 2015-05-27 DIAGNOSIS — Z952 Presence of prosthetic heart valve: Secondary | ICD-10-CM

## 2015-05-27 DIAGNOSIS — Z5181 Encounter for therapeutic drug level monitoring: Secondary | ICD-10-CM

## 2015-05-27 LAB — BASIC METABOLIC PANEL
BUN: 22 mg/dL (ref 7–25)
CHLORIDE: 103 mmol/L (ref 98–110)
CO2: 25 mmol/L (ref 20–31)
Calcium: 9.3 mg/dL (ref 8.6–10.4)
Creat: 0.99 mg/dL (ref 0.50–0.99)
GLUCOSE: 109 mg/dL — AB (ref 65–99)
POTASSIUM: 4.9 mmol/L (ref 3.5–5.3)
SODIUM: 138 mmol/L (ref 135–146)

## 2015-05-27 LAB — POCT INR: INR: 2.7

## 2015-05-28 ENCOUNTER — Telehealth: Payer: Self-pay | Admitting: *Deleted

## 2015-05-28 NOTE — Telephone Encounter (Signed)
Pt notified of lab results by phone with verbal understanding.  

## 2015-06-05 ENCOUNTER — Ambulatory Visit (INDEPENDENT_AMBULATORY_CARE_PROVIDER_SITE_OTHER): Payer: Medicare Other | Admitting: Cardiovascular Disease

## 2015-06-05 ENCOUNTER — Ambulatory Visit (INDEPENDENT_AMBULATORY_CARE_PROVIDER_SITE_OTHER): Payer: Medicare Other | Admitting: *Deleted

## 2015-06-05 ENCOUNTER — Encounter: Payer: Self-pay | Admitting: Cardiovascular Disease

## 2015-06-05 VITALS — BP 96/70 | HR 70 | Ht 61.0 in | Wt 131.1 lb

## 2015-06-05 DIAGNOSIS — I359 Nonrheumatic aortic valve disorder, unspecified: Secondary | ICD-10-CM

## 2015-06-05 DIAGNOSIS — Z952 Presence of prosthetic heart valve: Secondary | ICD-10-CM

## 2015-06-05 DIAGNOSIS — I2510 Atherosclerotic heart disease of native coronary artery without angina pectoris: Secondary | ICD-10-CM

## 2015-06-05 DIAGNOSIS — Z954 Presence of other heart-valve replacement: Secondary | ICD-10-CM

## 2015-06-05 DIAGNOSIS — Z9861 Coronary angioplasty status: Secondary | ICD-10-CM

## 2015-06-05 DIAGNOSIS — Z5181 Encounter for therapeutic drug level monitoring: Secondary | ICD-10-CM

## 2015-06-05 DIAGNOSIS — Z7901 Long term (current) use of anticoagulants: Secondary | ICD-10-CM

## 2015-06-05 LAB — POCT INR: INR: 2.4

## 2015-06-05 NOTE — Patient Instructions (Signed)
Medication Instructions:  Your physician recommends that you continue on your current medications as directed. Please refer to the Current Medication list given to you today.   Labwork: Your physician recommends that you return for lab work in: 3 months on the day of or a few days before your office visit with Dr. Nahser.  You will need to FAST for this appointment - nothing to eat or drink after midnight the night before except water.   Testing/Procedures: None Ordered   Follow-Up: Your physician recommends that you schedule a follow-up appointment in: 3 months with Dr. Nahser   If you need a refill on your cardiac medications before your next appointment, please call your pharmacy.   Thank you for choosing [facility]! Michelle Swinyer, RN [phone]    

## 2015-06-05 NOTE — Progress Notes (Signed)
[name] Date of Birth  1945/08/03 [facility] [id number] [address]    [place]  [address] [phone]  Fax  [phone]  Problem List 1. Aortic valve replacement 2. Hyperlipidemia 3. Breast cancer   History of Present Illness:  Amanda Wiggins is a 70 year old female with a history of aortic stenosis-status post aortic valve replacement.  She also has a history of hypercholesterolemia.  She's not had any episodes of chest pain or shortness of breath.  She's had her INR levels checked in our Coumadin clinic. Her INR levels have all been therapeutic.  Nov. 6 , 2014  Amanda Wiggins is doing well.  i saw her a year ago.  She is 10 years our from her breast cancer and is doing well.    No cardiac complaints.   Able to do all of her normal activities.     Nov. 13, 2015:  Amanda Wiggins is doing well.   No CP .  No dyspnea.   Has been stressful.  Her mother died this past year.   Her INR levels have been  Well controlled   Nov. 14, 2016:  Doing well.  No cardiac issues.  INR levels have been stable,  A bit low the last time .  Had an AVR 13 years ago .    June 05, 2015:  Amanda Wiggins was recently seen at the hospital for an acute anterior wall ST segment elevation myocardial infarction. She had stenting of her mid LAD. She had recurrent pain on the day of her original discharge and was taken back to the Cath Lab and had a second stent placed. She continued to have some intermittent episodes of chest discomfort. She has had persistent ST segment elevation. Her left ventricular systolic function is moderately depressed with an EF of 40-45%.   She has akinesis of the distal anterior wall and apex.   She is not having the band like chest pain that she had on presentation Has had some GERD.   Asked about going back on Omeprazole  - we discussed her need for Protonix with the plavix .    Current Outpatient Prescriptions on File Prior to Visit  Medication Sig Dispense Refill  . aspirin 81 MG  chewable tablet Chew 1 tablet (81 mg total) by mouth daily.    Marland Kitchen BIOTIN PO Take 1 tablet by mouth every morning.     . Calcium Carbonate-Vitamin D (CALCIUM 600+D) 600-400 MG-UNIT per tablet Take 1 tablet by mouth 2 (two) times daily.      . carvedilol (COREG) 6.25 MG tablet Take 1 tablet (6.25 mg total) by mouth 2 (two) times daily with a meal. 60 tablet 11  . clopidogrel (PLAVIX) 75 MG tablet Take 1 tablet (75 mg total) by mouth daily with breakfast. 90 tablet 3  . cycloSPORINE (RESTASIS) 0.05 % ophthalmic emulsion Place 1 drop into both eyes 2 (two) times daily as needed.     . Glucosamine-Chondroit-Vit C-Mn (GLUCOSAMINE CHONDR 1500 COMPLX) CAPS Take 1 capsule by mouth every morning.     . loratadine (CLARITIN) 10 MG tablet Take 10 mg by mouth daily.      . Multiple Vitamin (MULTIVITAMIN) capsule Take 1 capsule by mouth daily.      . nitroGLYCERIN (NITROSTAT) 0.4 MG SL tablet Place 1 tablet (0.4 mg total) under the tongue every 5 (five) minutes as needed for chest pain. 25 tablet 2  . pantoprazole (PROTONIX) 40 MG tablet Take 1 tablet (40 mg total) by mouth  daily. 30 tablet 6  . rosuvastatin (CRESTOR) 20 MG tablet Take 1 tablet (20 mg total) by mouth daily at 6 PM. 30 tablet 11  . senna (SENOKOT) 8.6 MG tablet Take 2 tablets by mouth daily.      . temazepam (RESTORIL) 30 MG capsule Take 1 capsule (30 mg total) by mouth at bedtime as needed. 90 capsule 2  . warfarin (COUMADIN) 5 MG tablet Take 1 tablet (5 mg total) by mouth daily at 6 PM.     No current facility-administered medications on file prior to visit.    Allergies  Allergen Reactions  . Latex Hives  . Meperidine Hcl Nausea And Vomiting  . Sulfamethoxazole Nausea And Vomiting    REACTION: unspecified    Past Medical History  Diagnosis Date  . Hyperlipidemia   . Hypertension   . Hx of breast cancer   . GERD (gastroesophageal reflux disease)   . Osteopenia   . Status post aortic valve repair   . FHx: migraine headaches   .  Menopausal syndrome   . Aortic valve replaced   . Hiatal hernia   . Hx Breast Cancer, IDC, Stage I 07/03/2003  . 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 08/22/2011    Past Surgical History  Procedure Laterality Date  . Lummpectomy breast cancer  07/03/2003  . Aortic valve replacement  2003  . Hysterectomy, endometiral cancer  2001  . Right shouler surgery    . [employer] fundoplicaiton  [id number]    post-op hematoma on heparin   . Hiatal hernia repair    . Cardiac catheterization      Ejection Fraction   . Cardiac catheterization N/A 05/16/2015    Procedure: Left Heart Cath and Coronary Angiography;  Surgeon: Leonie Man, MD;  Location: [facility];  Service: Cardiovascular;  Laterality: N/A;  . Cardiac catheterization  05/16/2015    Procedure: Coronary Stent Intervention;  Surgeon: Leonie Man, MD;  Location: [facility];  Service: Cardiovascular;;  . Cardiac catheterization N/A 05/19/2015    Procedure: Left Heart Cath and Coronary Angiography;  Surgeon: Troy Sine, MD;  Location: [facility];  Service: Cardiovascular;  Laterality: N/A;  . Cardiac catheterization N/A 05/19/2015    Procedure: Coronary Stent Intervention;  Surgeon: Troy Sine, MD;  Location: [facility];  Service: Cardiovascular;  Laterality: N/A;    History  Smoking status  . Never Smoker   Smokeless tobacco  . Never Used    History  Alcohol Use  . 0.0 oz/week  . 0 Standard drinks or equivalent per week    Comment: socially    Family History  Problem Relation Age of Onset  . Heart attack Mother 23  . Osteoarthritis Brother     hpercholesterolemia    Reviw of Systems:  Reviewed in the HPI.  All other systems are negative.  Physical Exam: BP 96/70 mmHg  Pulse 70  Ht 5\' 1"  (1.549 m)  Wt 131 lb 1.9 oz (59.476 kg)  BMI 24.79 kg/m2 The patient is alert and oriented x 3.  The mood and affect are normal.   Skin: warm and dry.  Color is normal.    HEENT:   the sclera are  nonicteric.  The mucous membranes are moist.  The carotids are 2+ without bruits.  There is no thyromegaly.  There is no JVD.    Lungs: clear.  The chest wall is non tender.    Heart: regular rate with a  normal S1 and mechanical S2.  There are no murmurs, gallops, or rubs. The PMI is not displaced.     Abdomen: good bowel sounds.  There is no guarding or rebound.  There is no hepatosplenomegaly or tenderness.  There are no masses.   Extremities:  no clubbing, cyanosis, or edema.  The legs are without rashes.  The distal pulses are intact.   Neuro:  Cranial nerves II - XII are intact.  Motor and sensory functions are intact.    The gait is normal.  ECG:    Assessment / Plan:   1. Aortic valve replacement - continue current meds.   To coumadin clinic today   2. Hyperlipidemia - lipids look great   3. Breast cancer - stable   4. CAD - is s/p stenting of her LAD x2.  Seems to be doing well at this point .  Seems to be still sluggish but is slowly regaining her strength.   She wants to go ahead and start with cardiac rehab.   Continue plavix   5. Chronic systolic CHF:   EF is [id number].   BP is still too low to add ARB or beta blocker Will re-evaluate in 3 months .   Amanda Wiggins, Wonda Cheng, MD  06/05/2015 2:16 PM    [address],  [place], [place]  [address] Pager [phone] Phone: [phone]; Fax: [phone]

## 2015-06-11 ENCOUNTER — Other Ambulatory Visit: Payer: Self-pay | Admitting: Internal Medicine

## 2015-06-11 ENCOUNTER — Other Ambulatory Visit: Payer: Self-pay | Admitting: *Deleted

## 2015-06-17 ENCOUNTER — Encounter (HOSPITAL_COMMUNITY): Payer: Self-pay

## 2015-06-17 ENCOUNTER — Encounter (HOSPITAL_COMMUNITY)
Admission: RE | Admit: 2015-06-17 | Discharge: 2015-06-17 | Disposition: A | Discharge status: Home / Self Care | Payer: Medicare Other | Source: Ambulatory Visit | Attending: Cardiovascular Disease | Admitting: Cardiovascular Disease

## 2015-06-17 VITALS — BP 92/60 | HR 70 | Ht 61.0 in | Wt 133.4 lb

## 2015-06-17 DIAGNOSIS — Z79899 Other long term (current) drug therapy: Secondary | ICD-10-CM | POA: Insufficient documentation

## 2015-06-17 DIAGNOSIS — Z7982 Long term (current) use of aspirin: Secondary | ICD-10-CM | POA: Diagnosis not present

## 2015-06-17 DIAGNOSIS — I2102 ST elevation (STEMI) myocardial infarction involving left anterior descending coronary artery: Secondary | ICD-10-CM | POA: Diagnosis present

## 2015-06-17 DIAGNOSIS — E785 Hyperlipidemia, unspecified: Secondary | ICD-10-CM | POA: Diagnosis not present

## 2015-06-17 DIAGNOSIS — Z7902 Long term (current) use of antithrombotics/antiplatelets: Secondary | ICD-10-CM | POA: Insufficient documentation

## 2015-06-17 DIAGNOSIS — Z955 Presence of coronary angioplasty implant and graft: Secondary | ICD-10-CM | POA: Diagnosis not present

## 2015-06-17 DIAGNOSIS — I10 Essential (primary) hypertension: Secondary | ICD-10-CM | POA: Insufficient documentation

## 2015-06-17 DIAGNOSIS — M810 Age-related osteoporosis without current pathological fracture: Secondary | ICD-10-CM | POA: Diagnosis not present

## 2015-06-17 DIAGNOSIS — Z853 Personal history of malignant neoplasm of breast: Secondary | ICD-10-CM | POA: Diagnosis not present

## 2015-06-17 DIAGNOSIS — K219 Gastro-esophageal reflux disease without esophagitis: Secondary | ICD-10-CM | POA: Diagnosis not present

## 2015-06-17 NOTE — Progress Notes (Signed)
Cardiac Individual Treatment Plan  Patient Details  Name: Amanda Wiggins MRN: [id number] Date of Birth: Sep 13, 1945 Referring Provider:  Thayer Headings, MD  Initial Encounter Date:       CARDIAC REHAB PHASE II ORIENTATION from 06/17/2015 in [facility]   Date  06/17/15      Visit Diagnosis: ST elevation (STEMI) myocardial infarction involving left anterior descending coronary artery (HCC)  Stented coronary artery  Patient's Home Medications on Admission:  Current outpatient prescriptions:  .  aspirin 81 MG chewable tablet, Chew 1 tablet (81 mg total) by mouth daily., Disp: , Rfl:  .  BIOTIN PO, Take 1 tablet by mouth every morning. , Disp: , Rfl:  .  Calcium Carbonate-Vitamin D (CALCIUM 600+D) 600-400 MG-UNIT per tablet, Take 1 tablet by mouth 2 (two) times daily.  , Disp: , Rfl:  .  carvedilol (COREG) 6.25 MG tablet, Take 1 tablet (6.25 mg total) by mouth 2 (two) times daily with a meal., Disp: 60 tablet, Rfl: 11 .  clopidogrel (PLAVIX) 75 MG tablet, Take 1 tablet (75 mg total) by mouth daily with breakfast., Disp: 90 tablet, Rfl: 3 .  cycloSPORINE (RESTASIS) 0.05 % ophthalmic emulsion, Place 1 drop into both eyes 2 (two) times daily as needed. , Disp: , Rfl:  .  Glucosamine-Chondroit-Vit C-Mn (GLUCOSAMINE CHONDR 1500 COMPLX) CAPS, Take 1 capsule by mouth every morning. , Disp: , Rfl:  .  loratadine (CLARITIN) 10 MG tablet, Take 10 mg by mouth daily.  , Disp: , Rfl:  .  Multiple Vitamin (MULTIVITAMIN) capsule, Take 1 capsule by mouth daily.  , Disp: , Rfl:  .  nitroGLYCERIN (NITROSTAT) 0.4 MG SL tablet, Place 1 tablet (0.4 mg total) under the tongue every 5 (five) minutes as needed for chest pain., Disp: 25 tablet, Rfl: 2 .  pantoprazole (PROTONIX) 40 MG tablet, Take 1 tablet (40 mg total) by mouth daily., Disp: 30 tablet, Rfl: 6 .  rosuvastatin (CRESTOR) 20 MG tablet, Take 1 tablet (20 mg total) by mouth daily at 6 PM., Disp: 30 tablet, Rfl: 11 .  senna  (SENOKOT) 8.6 MG tablet, Take 2 tablets by mouth daily as needed for constipation. , Disp: , Rfl:  .  temazepam (RESTORIL) 30 MG capsule, Take 1 capsule (30 mg total) by mouth at bedtime as needed., Disp: 90 capsule, Rfl: 2 .  warfarin (COUMADIN) 5 MG tablet, Take 1 tablet (5 mg total) by mouth daily at 6 PM. (Patient taking differently: Take 5 mg by mouth daily at 6 PM. Takes 2.5 mg twice a week on Mon Thurs), Disp: , Rfl:   Past Medical History: Past Medical History  Diagnosis Date  . Hyperlipidemia   . Hypertension   . Hx of breast cancer   . GERD (gastroesophageal reflux disease)   . Osteopenia   . Status post aortic valve repair   . FHx: migraine headaches   . Menopausal syndrome   . Aortic valve replaced   . Hiatal hernia   . Hx Breast Cancer, IDC, Stage I 07/03/2003  . 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 Never Smoker   Smokeless tobacco  . Never Used    Labs:     Recent Review Flowsheet Data    Labs for ITP Cardiac and Pulmonary Rehab Latest Ref Rng 01/10/2013 12/30/2013 06/23/2014 05/16/2015 05/16/2015   Cholestrol 0 - 200 mg/dL 184 177 172 - -   LDLCALC 0 - 99  mg/dL 100(H) 88 79 - -   HDL >39.00 mg/dL 55.70 54.60 55.80 - -   Trlycerides 0.0 - 149.0 mg/dL 144.0 170.0(H) 184.0(H) - -   TCO2 0 - 100 mmol/L - - - 21 22      Capillary Blood Glucose: No results found for: GLUCAP   Exercise Target Goals: Date: 06/17/15  Exercise Program Goal: Individual exercise prescription set with THRR, safety & activity barriers. Participant demonstrates ability to understand and report RPE using BORG scale, to self-measure pulse accurately, and to acknowledge the importance of the exercise prescription.  Exercise Prescription Goal: Starting with aerobic activity 30 plus minutes a day, 3 days per week for initial exercise prescription. Provide home exercise prescription and guidelines that participant acknowledges  understanding prior to discharge.  Activity Barriers & Risk Stratification:     Activity Barriers & Cardiac Risk Stratification - 06/17/15 1137    Activity Barriers & Cardiac Risk Stratification   Activity Barriers None   Cardiac Risk Stratification High      6 Minute Walk:     6 Minute Walk      06/17/15 1440       6 Minute Walk   Phase Initial     Distance 650 feet     Walk Time 6 minutes     # of Rest Breaks 0     MPH 1.23     METS 1.94     RPE 11     Perceived Dyspnea  13     VO2 Peak 5.37     Symptoms No     Resting HR 70 bpm     Resting BP 92/68 mmHg     Max Ex. HR 79 bpm     Max Ex. BP 120/84 mmHg     2 Minute Post BP 98/60 mmHg        Initial Exercise Prescription:     Initial Exercise Prescription - 06/17/15 1400    Date of Initial Exercise Prescription   Date 06/17/15   Treadmill   MPH 1.5   Grade 0   Minutes 15   METs 2.15   NuStep   Level 2   Minutes 15   METs 1.5   Prescription Details   Frequency (times per week) 3   Duration Progress to 30 minutes of continuous aerobic without signs/symptoms of physical distress   Intensity   THRR REST +  30   THRR 40-80% of Max Heartrate 103-136   Ratings of Perceived Exertion 11-13   Perceived Dyspnea 0-4   Progression   Progression Continue to progress workloads to maintain intensity without signs/symptoms of physical distress.   Resistance Training   Training Prescription Yes   Weight 1   Reps 10-12      Perform Capillary Blood Glucose checks as needed.  Exercise Prescription Changes:   Exercise Comments:    Discharge Exercise Prescription (Final Exercise Prescription Changes):   Nutrition:  Target Goals: Understanding of nutrition guidelines, daily intake of sodium 1500mg , cholesterol 200mg , calories 30% from fat and 7% or less from saturated fats, daily to have 5 or more servings of fruits and vegetables.  Biometrics:     Pre Biometrics - 06/17/15 1446    Pre Biometrics    Waist Circumference 33.5 inches   Hip Circumference 38 inches   Waist to Hip Ratio 0.88 %   Triceps Skinfold 11 mm   % Body Fat 32.8 %   Grip Strength 47 kg   Flexibility 0  in  unable to test due to flex box unavailable   Single Leg Stand 18 seconds       Nutrition Therapy Plan and Nutrition Goals:     Nutrition Therapy & Goals - 06/17/15 1138    Nutrition Therapy   Diet Heart Healthy diet with low sodium.    Intervention Plan   Intervention Nutrition handout(s) given to patient.   Expected Outcomes Short Term Goal: Understand basic principles of dietary content, such as calories, fat, sodium, cholesterol and nutrients.      Nutrition Discharge: Rate Your Plate Scores:     Nutrition Assessments - 06/17/15 1144    MEDFICTS Scores   Pre Score 12      Nutrition Goals Re-Evaluation:   Psychosocial: Target Goals: Acknowledge presence or absence of depression, maximize coping skills, provide positive support system. Participant is able to verbalize types and ability to use techniques and skills needed for reducing stress and depression.  Initial Review & Psychosocial Screening:     Initial Psych Review & Screening - 06/17/15 1144    Initial Review   Current issues with Current Stress Concerns   Source of Stress Concerns Unable to participate in former interests or hobbies   [place]? Yes   Barriers   Psychosocial barriers to participate in program There are no identifiable barriers or psychosocial needs.   Screening Interventions   Interventions Encouraged to exercise      Quality of Life Scores:     Quality of Life - 06/17/15 1448    Quality of Life Scores   Health/Function Pre 24.8 %   Socioeconomic Pre 27.36 %   Psych/Spiritual Pre 30 %   Family Pre 26.4 %   GLOBAL Pre 26.63 %      PHQ-9:     Recent Review Flowsheet Data    Depression screen [facility] 2/9 06/17/2015 07/07/2014 06/20/2013   Decreased Interest 0 (No Data)  0    Down, Depressed, Hopeless 1 (No Data)  0   PHQ - 2 Score 1 - 0   Altered sleeping 0 - -   Tired, decreased energy 3 - -   Change in appetite 0 - -   Feeling bad or failure about yourself  0 - -   Trouble concentrating 0 - -   Moving slowly or fidgety/restless 0 - -   Suicidal thoughts 0 - -   PHQ-9 Score 4 - -   Difficult doing work/chores Somewhat difficult - -      Psychosocial Evaluation and Intervention:     Psychosocial Evaluation - 06/17/15 1145    Psychosocial Evaluation & Interventions   Continued Psychosocial Services Needed No      Psychosocial Re-Evaluation:   Vocational Rehabilitation: Provide vocational rehab assistance to qualifying candidates.   Vocational Rehab Evaluation & Intervention:     Vocational Rehab - 06/17/15 1138    Initial Vocational Rehab Evaluation & Intervention   Assessment shows need for Vocational Rehabilitation No      Education: Education Goals: Education classes will be provided on a weekly basis, covering required topics. Participant will state understanding/return demonstration of topics presented.  Learning Barriers/Preferences:     Learning Barriers/Preferences - 06/17/15 1137    Learning Barriers/Preferences   Learning Barriers None   Learning Preferences Video;Pictoral;Individual Instruction      Education Topics: Hypertension, Hypertension Reduction -Define heart disease and high blood pressure. Discus how high blood pressure affects the body and ways to reduce high  blood pressure.   Exercise and Your Heart -Discuss why it is important to exercise, the FITT principles of exercise, normal and abnormal responses to exercise, and how to exercise safely.   Angina -Discuss definition of angina, causes of angina, treatment of angina, and how to decrease risk of having angina.   Cardiac Medications -Review what the following cardiac medications are used for, how they affect the body, and side effects that may occur  when taking the medications.  Medications include Aspirin, Beta blockers, calcium channel blockers, ACE Inhibitors, angiotensin receptor blockers, diuretics, digoxin, and antihyperlipidemics.   Congestive Heart Failure -Discuss the definition of CHF, how to live with CHF, the signs and symptoms of CHF, and how keep track of weight and sodium intake.   Heart Disease and Intimacy -Discus the effect sexual activity has on the heart, how changes occur during intimacy as we age, and safety during sexual activity.   Smoking Cessation / COPD -Discuss different methods to quit smoking, the health benefits of quitting smoking, and the definition of COPD.   Nutrition I: Fats -Discuss the types of cholesterol, what cholesterol does to the heart, and how cholesterol levels can be controlled.   Nutrition II: Labels -Discuss the different components of food labels and how to read food label   Heart Parts and Heart Disease -Discuss the anatomy of the heart, the pathway of blood circulation through the heart, and these are affected by heart disease.   Stress I: Signs and Symptoms -Discuss the causes of stress, how stress may lead to anxiety and depression, and ways to limit stress.   Stress II: Relaxation -Discuss different types of relaxation techniques to limit stress.   Warning Signs of Stroke / TIA -Discuss definition of a stroke, what the signs and symptoms are of a stroke, and how to identify when someone is having stroke.   Knowledge Questionnaire Score:     Knowledge Questionnaire Score - 06/17/15 1138    Knowledge Questionnaire Score   Pre Score 24/24      Core Components/Risk Factors/Patient Goals at Admission:     Personal Goals and Risk Factors at Admission - 06/17/15 1139    Core Components/Risk Factors/Patient Goals on Admission   Increase Strength and Stamina Yes   Intervention Provide advice, education, support and counseling about physical activity/exercise  needs.;Develop an individualized exercise prescription for aerobic and resistive training based on initial evaluation findings, risk stratification, comorbidities and participant's personal goals.   Expected Outcomes Achievement of increased cardiorespiratory fitness and enhanced flexibility, muscular endurance and strength shown through measurements of functional capacity and personal statement of participant.   Tobacco Cessation --  Never smoked   Diabetes --  N/A   Lipids Yes   Intervention Provide education and support for participant on nutrition & aerobic/resistive exercise along with prescribed medications to achieve LDL 70mg , HDL >40mg .   Expected Outcomes Short Term: Participant states understanding of desired cholesterol values and is compliant with medications prescribed. Participant is following exercise prescription and nutrition guidelines.;Long Term: Cholesterol controlled with medications as prescribed, with individualized exercise RX and with personalized nutrition plan. Value goals: LDL < 70mg , HDL > 40 mg.   Personal Goal Other Yes   Personal Goal Increase energy, do things w/o SOB or tightness   Intervention Get back to normal activities   Expected Outcomes Feel better overall      Core Components/Risk Factors/Patient Goals Review:    Core Components/Risk Factors/Patient Goals at Discharge (Final Review):    ITP  Comments:   Comments: Patient arrived for 1st visit/orientation/education at 0800. Patient was referred to CR by Dr. Acie Fredrickson due to STEMI (I21.02) and Stent Placement (Z95.5). During orientation advised patient on arrival and appointment times what to wear, what to do before, during and after exercise. Reviewed attendance and class policy. Talked about inclement weather and class consultation policy. Pt is scheduled to return Cardiac Rehab on 06/22/15 at 0930. Pt was advised to come to class 15 minutes before class starts. He was also given instructions on meeting  with the dietician and attending the Family Structure classes. Pt is eager to get started. Patient was able to complete 6 minute walk test. Patient was measured for the equipment. Discussed equipment safety with patient. Took patient pre-anthropometric measurements. Patient finished visit at 1030.

## 2015-06-17 NOTE — Progress Notes (Signed)
Cardiac/Pulmonary Rehab Medication Review by a Pharmacist  Does the patient  feel that his/her medications are working for him/her?  yes  Has the patient been experiencing any side effects to the medications prescribed?  Yes.  She has low blood pressure but does not have any symptoms from this  Does the patient measure his/her own blood pressure or blood glucose at home?  yes   Does the patient have any problems obtaining medications due to transportation or finances?   no  Understanding of regimen: excellent Understanding of indications: excellent Potential of compliance: excellent   Amanda Wiggins does have some bruising from coumadin, ASA and plavix.  She understands the importance of taking these medications.  She has excellent potential for compliance.    Amanda Wiggins 06/17/2015 8:25 AM

## 2015-06-19 ENCOUNTER — Ambulatory Visit (INDEPENDENT_AMBULATORY_CARE_PROVIDER_SITE_OTHER): Payer: Medicare Other | Admitting: *Deleted

## 2015-06-19 DIAGNOSIS — Z5181 Encounter for therapeutic drug level monitoring: Secondary | ICD-10-CM | POA: Diagnosis not present

## 2015-06-19 DIAGNOSIS — Z954 Presence of other heart-valve replacement: Secondary | ICD-10-CM

## 2015-06-19 DIAGNOSIS — I359 Nonrheumatic aortic valve disorder, unspecified: Secondary | ICD-10-CM

## 2015-06-19 DIAGNOSIS — Z7901 Long term (current) use of anticoagulants: Secondary | ICD-10-CM | POA: Diagnosis not present

## 2015-06-19 DIAGNOSIS — Z952 Presence of prosthetic heart valve: Secondary | ICD-10-CM

## 2015-06-19 LAB — POCT INR: INR: 3.3

## 2015-06-22 ENCOUNTER — Encounter (HOSPITAL_COMMUNITY)
Admission: RE | Admit: 2015-06-22 | Discharge: 2015-06-22 | Disposition: A | Discharge status: Home / Self Care | Payer: Medicare Other | Source: Ambulatory Visit | Attending: Cardiovascular Disease | Admitting: Cardiovascular Disease

## 2015-06-22 ENCOUNTER — Ambulatory Visit
Admission: RE | Admit: 2015-06-22 | Discharge: 2015-06-22 | Disposition: A | Discharge status: Home / Self Care | Payer: Medicare Other | Source: Ambulatory Visit

## 2015-06-22 DIAGNOSIS — Z1231 Encounter for screening mammogram for malignant neoplasm of breast: Secondary | ICD-10-CM

## 2015-06-22 DIAGNOSIS — I2102 ST elevation (STEMI) myocardial infarction involving left anterior descending coronary artery: Secondary | ICD-10-CM | POA: Diagnosis not present

## 2015-06-24 ENCOUNTER — Encounter (HOSPITAL_COMMUNITY)
Admission: RE | Admit: 2015-06-24 | Discharge: 2015-06-24 | Disposition: A | Discharge status: Home / Self Care | Payer: Medicare Other | Source: Ambulatory Visit | Attending: Cardiovascular Disease | Admitting: Cardiovascular Disease

## 2015-06-24 DIAGNOSIS — I2102 ST elevation (STEMI) myocardial infarction involving left anterior descending coronary artery: Secondary | ICD-10-CM | POA: Diagnosis not present

## 2015-06-26 ENCOUNTER — Encounter (HOSPITAL_COMMUNITY)
Admission: RE | Admit: 2015-06-26 | Discharge: 2015-06-26 | Disposition: A | Discharge status: Home / Self Care | Payer: Medicare Other | Source: Ambulatory Visit | Attending: Cardiovascular Disease | Admitting: Cardiovascular Disease

## 2015-06-26 DIAGNOSIS — I2102 ST elevation (STEMI) myocardial infarction involving left anterior descending coronary artery: Secondary | ICD-10-CM | POA: Diagnosis not present

## 2015-06-29 ENCOUNTER — Encounter (HOSPITAL_COMMUNITY)
Admission: RE | Admit: 2015-06-29 | Discharge: 2015-06-29 | Disposition: A | Discharge status: Home / Self Care | Payer: Medicare Other | Source: Ambulatory Visit | Attending: Cardiovascular Disease | Admitting: Cardiovascular Disease

## 2015-06-29 DIAGNOSIS — I2102 ST elevation (STEMI) myocardial infarction involving left anterior descending coronary artery: Secondary | ICD-10-CM | POA: Diagnosis not present

## 2015-07-01 ENCOUNTER — Ambulatory Visit (INDEPENDENT_AMBULATORY_CARE_PROVIDER_SITE_OTHER): Payer: Medicare Other | Admitting: *Deleted

## 2015-07-01 ENCOUNTER — Encounter (HOSPITAL_COMMUNITY)
Admission: RE | Admit: 2015-07-01 | Discharge: 2015-07-01 | Disposition: A | Discharge status: Home / Self Care | Payer: Medicare Other | Source: Ambulatory Visit | Attending: Cardiovascular Disease | Admitting: Cardiovascular Disease

## 2015-07-01 DIAGNOSIS — Z5181 Encounter for therapeutic drug level monitoring: Secondary | ICD-10-CM | POA: Diagnosis not present

## 2015-07-01 DIAGNOSIS — Z954 Presence of other heart-valve replacement: Secondary | ICD-10-CM

## 2015-07-01 DIAGNOSIS — Z952 Presence of prosthetic heart valve: Secondary | ICD-10-CM

## 2015-07-01 DIAGNOSIS — I2102 ST elevation (STEMI) myocardial infarction involving left anterior descending coronary artery: Secondary | ICD-10-CM | POA: Diagnosis not present

## 2015-07-01 DIAGNOSIS — I359 Nonrheumatic aortic valve disorder, unspecified: Secondary | ICD-10-CM

## 2015-07-01 DIAGNOSIS — Z7901 Long term (current) use of anticoagulants: Secondary | ICD-10-CM | POA: Diagnosis not present

## 2015-07-01 LAB — POCT INR: INR: 2.6

## 2015-07-03 ENCOUNTER — Encounter (HOSPITAL_COMMUNITY)
Admission: RE | Admit: 2015-07-03 | Discharge: 2015-07-03 | Disposition: A | Discharge status: Home / Self Care | Payer: Medicare Other | Source: Ambulatory Visit | Attending: Cardiovascular Disease | Admitting: Cardiovascular Disease

## 2015-07-03 DIAGNOSIS — I2102 ST elevation (STEMI) myocardial infarction involving left anterior descending coronary artery: Secondary | ICD-10-CM | POA: Diagnosis not present

## 2015-07-03 NOTE — Progress Notes (Signed)
Cardiac Individual Treatment Plan  Patient Details  Name: Amanda Wiggins MRN: [id number] Date of Birth: Sep 22, 1945 Referring Provider:    Initial Encounter Date:       CARDIAC REHAB PHASE II ORIENTATION from 06/17/2015 in [facility]   Date  06/17/15      Visit Diagnosis: No diagnosis found.  Patient's Home Medications on Admission:  Current outpatient prescriptions:  .  BIOTIN PO, Take 1 tablet by mouth every morning. , Disp: , Rfl:  .  Calcium Carbonate-Vitamin D (CALCIUM 600+D) 600-400 MG-UNIT per tablet, Take 1 tablet by mouth 2 (two) times daily.  , Disp: , Rfl:  .  carvedilol (COREG) 6.25 MG tablet, Take 1 tablet (6.25 mg total) by mouth 2 (two) times daily with a meal., Disp: 60 tablet, Rfl: 11 .  clopidogrel (PLAVIX) 75 MG tablet, Take 1 tablet (75 mg total) by mouth daily with breakfast., Disp: 90 tablet, Rfl: 3 .  cycloSPORINE (RESTASIS) 0.05 % ophthalmic emulsion, Place 1 drop into both eyes 2 (two) times daily as needed. , Disp: , Rfl:  .  Glucosamine-Chondroit-Vit C-Mn (GLUCOSAMINE CHONDR 1500 COMPLX) CAPS, Take 1 capsule by mouth every morning. , Disp: , Rfl:  .  loratadine (CLARITIN) 10 MG tablet, Take 10 mg by mouth daily.  , Disp: , Rfl:  .  Multiple Vitamin (MULTIVITAMIN) capsule, Take 1 capsule by mouth daily.  , Disp: , Rfl:  .  nitroGLYCERIN (NITROSTAT) 0.4 MG SL tablet, Place 1 tablet (0.4 mg total) under the tongue every 5 (five) minutes as needed for chest pain., Disp: 25 tablet, Rfl: 2 .  pantoprazole (PROTONIX) 40 MG tablet, Take 1 tablet (40 mg total) by mouth daily., Disp: 30 tablet, Rfl: 6 .  rosuvastatin (CRESTOR) 20 MG tablet, Take 1 tablet (20 mg total) by mouth daily at 6 PM., Disp: 30 tablet, Rfl: 11 .  senna (SENOKOT) 8.6 MG tablet, Take 2 tablets by mouth daily as needed for constipation. , Disp: , Rfl:  .  temazepam (RESTORIL) 30 MG capsule, Take 1 capsule (30 mg total) by mouth at bedtime as needed., Disp: 90 capsule, Rfl: 2 .   warfarin (COUMADIN) 5 MG tablet, Take 1 tablet (5 mg total) by mouth daily at 6 PM. (Patient taking differently: Take 5 mg by mouth daily at 6 PM. Takes 2.5 mg twice a week on Mon Thurs), Disp: , Rfl:   Past Medical History: Past Medical History  Diagnosis Date  . Hyperlipidemia   . Hypertension   . Hx of breast cancer   . GERD (gastroesophageal reflux disease)   . Osteopenia   . Status post aortic valve repair   . FHx: migraine headaches   . Menopausal syndrome   . Aortic valve replaced   . Hiatal hernia   . Hx Breast Cancer, IDC, Stage I 07/03/2003  . 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 Never Smoker   Smokeless tobacco  . Never Used    Labs:     Recent Review Flowsheet Data    Labs for ITP Cardiac and Pulmonary Rehab Latest Ref Rng 01/10/2013 12/30/2013 06/23/2014 05/16/2015 05/16/2015   Cholestrol 0 - 200 mg/dL 184 177 172 - -   LDLCALC 0 - 99 mg/dL 100(H) 88 79 - -   HDL >39.00 mg/dL 55.70 54.60 55.80 - -   Trlycerides 0.0 - 149.0 mg/dL 144.0 170.0(H) 184.0(H) - -   TCO2 0 - 100 mmol/L - - -  21 22      Capillary Blood Glucose: No results found for: GLUCAP   Exercise Target Goals:    Exercise Program Goal: Individual exercise prescription set with THRR, safety & activity barriers. Participant demonstrates ability to understand and report RPE using BORG scale, to self-measure pulse accurately, and to acknowledge the importance of the exercise prescription.  Exercise Prescription Goal: Starting with aerobic activity 30 plus minutes a day, 3 days per week for initial exercise prescription. Provide home exercise prescription and guidelines that participant acknowledges understanding prior to discharge.  Activity Barriers & Risk Stratification:     Activity Barriers & Cardiac Risk Stratification - 06/17/15 1137    Activity Barriers & Cardiac Risk Stratification   Activity Barriers None   Cardiac Risk  Stratification High      6 Minute Walk:     6 Minute Walk      06/17/15 1440       6 Minute Walk   Phase Initial     Distance 650 feet     Walk Time 6 minutes     # of Rest Breaks 0     MPH 1.23     METS 1.94     RPE 11     Perceived Dyspnea  13     VO2 Peak 5.37     Symptoms No     Resting HR 70 bpm     Resting BP 92/68 mmHg     Max Ex. HR 79 bpm     Max Ex. BP 120/84 mmHg     2 Minute Post BP 98/60 mmHg        Initial Exercise Prescription:     Initial Exercise Prescription - 06/17/15 1400    Date of Initial Exercise RX and Referring Provider   Date 06/17/15   Treadmill   MPH 1.5   Grade 0   Minutes 15   METs 2.15   NuStep   Level 2   Minutes 15   METs 1.5   Prescription Details   Frequency (times per week) 3   Duration Progress to 30 minutes of continuous aerobic without signs/symptoms of physical distress   Intensity   THRR REST +  30   THRR 40-80% of Max Heartrate 103-136   Ratings of Perceived Exertion 11-13   Perceived Dyspnea 0-4   Progression   Progression Continue to progress workloads to maintain intensity without signs/symptoms of physical distress.   Resistance Training   Training Prescription Yes   Weight 1   Reps 10-12      Perform Capillary Blood Glucose checks as needed.  Exercise Prescription Changes:      Exercise Prescription Changes      07/03/15 1900           Exercise Review   Progression Yes       Response to Exercise   Blood Pressure (Admit) 92/50 mmHg       Blood Pressure (Exercise) 92/60 mmHg       Blood Pressure (Exit) 88/60 mmHg       Heart Rate (Admit) 78 bpm       Heart Rate (Exercise) 93 bpm       Heart Rate (Exit) 84 bpm       Symptoms No       Duration Progress to 30 minutes of continuous aerobic without signs/symptoms of physical distress       Progression   Progression Continue to progress workloads to maintain intensity without signs/symptoms  of physical distress.       Resistance Training    Training Prescription Yes       Weight 1.0       Reps 10-12       Treadmill   MPH 2.7       Grade 0       Minutes 15       METs 3.1       NuStep   Level 2       Minutes 15       METs 1.8       Home Exercise Plan   Plans to continue exercise at Home       Frequency Add 2 additional days to program exercise sessions.          Exercise Comments:      Exercise Comments      07/03/15 1930           Exercise Comments Patient is progressing appropriately.           Discharge Exercise Prescription (Final Exercise Prescription Changes):     Exercise Prescription Changes - 07/03/15 1900    Exercise Review   Progression Yes   Response to Exercise   Blood Pressure (Admit) 92/50 mmHg   Blood Pressure (Exercise) 92/60 mmHg   Blood Pressure (Exit) 88/60 mmHg   Heart Rate (Admit) 78 bpm   Heart Rate (Exercise) 93 bpm   Heart Rate (Exit) 84 bpm   Symptoms No   Duration Progress to 30 minutes of continuous aerobic without signs/symptoms of physical distress   Progression   Progression Continue to progress workloads to maintain intensity without signs/symptoms of physical distress.   Resistance Training   Training Prescription Yes   Weight 1.0   Reps 10-12   Treadmill   MPH 2.7   Grade 0   Minutes 15   METs 3.1   NuStep   Level 2   Minutes 15   METs 1.8   Home Exercise Plan   Plans to continue exercise at Home   Frequency Add 2 additional days to program exercise sessions.      Nutrition:  Target Goals: Understanding of nutrition guidelines, daily intake of sodium 1500mg , cholesterol 200mg , calories 30% from fat and 7% or less from saturated fats, daily to have 5 or more servings of fruits and vegetables.  Biometrics:     Pre Biometrics - 06/17/15 1446    Pre Biometrics   Waist Circumference 33.5 inches   Hip Circumference 38 inches   Waist to Hip Ratio 0.88 %   Triceps Skinfold 11 mm   % Body Fat 32.8 %   Grip Strength 47 kg   Flexibility 0 in   unable to test due to flex box unavailable   Single Leg Stand 18 seconds       Nutrition Therapy Plan and Nutrition Goals:     Nutrition Therapy & Goals - 06/17/15 1138    Nutrition Therapy   Diet Heart Healthy diet with low sodium.    Intervention Plan   Intervention Nutrition handout(s) given to patient.   Expected Outcomes Short Term Goal: Understand basic principles of dietary content, such as calories, fat, sodium, cholesterol and nutrients.      Nutrition Discharge: Rate Your Plate Scores:     Nutrition Assessments - 06/17/15 1144    MEDFICTS Scores   Pre Score 12      Nutrition Goals Re-Evaluation:     Nutrition Goals Re-Evaluation  07/03/15 0844           Intervention Plan   Intervention Continue to educate, counsel and set short/long term goals regarding individualized specific personal dietary modifications.          Psychosocial: Target Goals: Acknowledge presence or absence of depression, maximize coping skills, provide positive support system. Participant is able to verbalize types and ability to use techniques and skills needed for reducing stress and depression.  Initial Review & Psychosocial Screening:     Initial Psych Review & Screening - 07/03/15 0848    Initial Review   Current issues with Current Stress Concerns      Quality of Life Scores:     Quality of Life - 06/17/15 1448    Quality of Life Scores   Health/Function Pre 24.8 %   Socioeconomic Pre 27.36 %   Psych/Spiritual Pre 30 %   Family Pre 26.4 %   GLOBAL Pre 26.63 %      PHQ-9:     Recent Review Flowsheet Data    Depression screen [facility] 2/9 06/17/2015 07/07/2014 06/20/2013   Decreased Interest 0 (No Data)  0   Down, Depressed, Hopeless 1 (No Data)  0   PHQ - 2 Score 1 - 0   Altered sleeping 0 - -   Tired, decreased energy 3 - -   Change in appetite 0 - -   Feeling bad or failure about yourself  0 - -   Trouble concentrating 0 - -   Moving slowly or  fidgety/restless 0 - -   Suicidal thoughts 0 - -   PHQ-9 Score 4 - -   Difficult doing work/chores Somewhat difficult - -      Psychosocial Evaluation and Intervention:     Psychosocial Evaluation - 06/17/15 1145    Psychosocial Evaluation & Interventions   Continued Psychosocial Services Needed No      Psychosocial Re-Evaluation:     Psychosocial Re-Evaluation      07/03/15 0848           Psychosocial Re-Evaluation   Interventions Encouraged to attend Cardiac Rehabilitation for the exercise       Continued Psychosocial Services Needed No          Vocational Rehabilitation: Provide vocational rehab assistance to qualifying candidates.   Vocational Rehab Evaluation & Intervention:     Vocational Rehab - 06/17/15 1138    Initial Vocational Rehab Evaluation & Intervention   Assessment shows need for Vocational Rehabilitation No      Education: Education Goals: Education classes will be provided on a weekly basis, covering required topics. Participant will state understanding/return demonstration of topics presented.  Learning Barriers/Preferences:     Learning Barriers/Preferences - 06/17/15 1137    Learning Barriers/Preferences   Learning Barriers None   Learning Preferences Video;Pictoral;Individual Instruction      Education Topics: Hypertension, Hypertension Reduction -Define heart disease and high blood pressure. Discus how high blood pressure affects the body and ways to reduce high blood pressure.   Exercise and Your Heart -Discuss why it is important to exercise, the FITT principles of exercise, normal and abnormal responses to exercise, and how to exercise safely.   Angina -Discuss definition of angina, causes of angina, treatment of angina, and how to decrease risk of having angina.   Cardiac Medications -Review what the following cardiac medications are used for, how they affect the body, and side effects that may occur when taking the  medications.  Medications include Aspirin, Beta  blockers, calcium channel blockers, ACE Inhibitors, angiotensin receptor blockers, diuretics, digoxin, and antihyperlipidemics.   Congestive Heart Failure -Discuss the definition of CHF, how to live with CHF, the signs and symptoms of CHF, and how keep track of weight and sodium intake.   Heart Disease and Intimacy -Discus the effect sexual activity has on the heart, how changes occur during intimacy as we age, and safety during sexual activity.   Smoking Cessation / COPD -Discuss different methods to quit smoking, the health benefits of quitting smoking, and the definition of COPD.   Nutrition I: Fats -Discuss the types of cholesterol, what cholesterol does to the heart, and how cholesterol levels can be controlled.   Nutrition II: Labels -Discuss the different components of food labels and how to read food label   Heart Parts and Heart Disease -Discuss the anatomy of the heart, the pathway of blood circulation through the heart, and these are affected by heart disease.   Stress I: Signs and Symptoms -Discuss the causes of stress, how stress may lead to anxiety and depression, and ways to limit stress.   Stress II: Relaxation -Discuss different types of relaxation techniques to limit stress.      CARDIAC REHAB PHASE II EXERCISE from 07/01/2015 in [facility]   Date  06/24/15   Educator  D Caron Tardif   Instruction Review Code  2- meets goals/outcomes      Warning Signs of Stroke / TIA -Discuss definition of a stroke, what the signs and symptoms are of a stroke, and how to identify when someone is having stroke.          CARDIAC REHAB PHASE II EXERCISE from 07/01/2015 in [facility]   Date  07/01/15   Educator  Russella Dar   Instruction Review Code  2- meets goals/outcomes      Knowledge Questionnaire Score:     Knowledge Questionnaire Score - 06/17/15 1138    Knowledge  Questionnaire Score   Pre Score 24/24      Core Components/Risk Factors/Patient Goals at Admission:     Personal Goals and Risk Factors at Admission - 06/17/15 1139    Core Components/Risk Factors/Patient Goals on Admission   Increase Strength and Stamina Yes   Intervention Provide advice, education, support and counseling about physical activity/exercise needs.;Develop an individualized exercise prescription for aerobic and resistive training based on initial evaluation findings, risk stratification, comorbidities and participant's personal goals.   Expected Outcomes Achievement of increased cardiorespiratory fitness and enhanced flexibility, muscular endurance and strength shown through measurements of functional capacity and personal statement of participant.   Tobacco Cessation --  Never smoked   Diabetes --  N/A   Lipids Yes   Intervention Provide education and support for participant on nutrition & aerobic/resistive exercise along with prescribed medications to achieve LDL 70mg , HDL >40mg .   Expected Outcomes Short Term: Participant states understanding of desired cholesterol values and is compliant with medications prescribed. Participant is following exercise prescription and nutrition guidelines.;Long Term: Cholesterol controlled with medications as prescribed, with individualized exercise RX and with personalized nutrition plan. Value goals: LDL < 70mg , HDL > 40 mg.   Personal Goal Other Yes   Personal Goal Increase energy, do things w/o SOB or tightness   Intervention Get back to normal activities   Expected Outcomes Feel better overall      Core Components/Risk Factors/Patient Goals Review:      Goals and Risk Factor Review      07/03/15 0845  Core Components/Risk Factors/Patient Goals Review   Personal Goals Review Increase Strength and Stamina;Lipids       Review Will continue to progress patient and educate       Expected Outcomes Have more energy           Core Components/Risk Factors/Patient Goals at Discharge (Final Review):      Goals and Risk Factor Review - 07/03/15 0845    Core Components/Risk Factors/Patient Goals Review   Personal Goals Review Increase Strength and Stamina;Lipids   Review Will continue to progress patient and educate   Expected Outcomes Have more energy      ITP Comments:   Comments: Patient is expected to progress towards personal goals after completing sessions. Recommend continual exercise, lifestyle modifications, education and utilization of home exercise plan to increase stamina and strength.

## 2015-07-06 ENCOUNTER — Encounter (HOSPITAL_COMMUNITY)
Admission: RE | Admit: 2015-07-06 | Discharge: 2015-07-06 | Disposition: A | Discharge status: Home / Self Care | Payer: Medicare Other | Source: Ambulatory Visit | Attending: Cardiovascular Disease | Admitting: Cardiovascular Disease

## 2015-07-06 ENCOUNTER — Other Ambulatory Visit: Payer: Medicare Other

## 2015-07-06 DIAGNOSIS — E785 Hyperlipidemia, unspecified: Secondary | ICD-10-CM | POA: Insufficient documentation

## 2015-07-06 DIAGNOSIS — Z7982 Long term (current) use of aspirin: Secondary | ICD-10-CM | POA: Insufficient documentation

## 2015-07-06 DIAGNOSIS — Z79899 Other long term (current) drug therapy: Secondary | ICD-10-CM | POA: Insufficient documentation

## 2015-07-06 DIAGNOSIS — Z7902 Long term (current) use of antithrombotics/antiplatelets: Secondary | ICD-10-CM | POA: Insufficient documentation

## 2015-07-06 DIAGNOSIS — M810 Age-related osteoporosis without current pathological fracture: Secondary | ICD-10-CM | POA: Diagnosis not present

## 2015-07-06 DIAGNOSIS — Z853 Personal history of malignant neoplasm of breast: Secondary | ICD-10-CM | POA: Diagnosis not present

## 2015-07-06 DIAGNOSIS — K219 Gastro-esophageal reflux disease without esophagitis: Secondary | ICD-10-CM | POA: Diagnosis not present

## 2015-07-06 DIAGNOSIS — I10 Essential (primary) hypertension: Secondary | ICD-10-CM | POA: Insufficient documentation

## 2015-07-06 DIAGNOSIS — I2102 ST elevation (STEMI) myocardial infarction involving left anterior descending coronary artery: Secondary | ICD-10-CM | POA: Diagnosis not present

## 2015-07-06 DIAGNOSIS — Z955 Presence of coronary angioplasty implant and graft: Secondary | ICD-10-CM | POA: Diagnosis not present

## 2015-07-08 ENCOUNTER — Encounter (HOSPITAL_COMMUNITY)
Admission: RE | Admit: 2015-07-08 | Discharge: 2015-07-08 | Disposition: A | Discharge status: Home / Self Care | Payer: Medicare Other | Source: Ambulatory Visit | Attending: Cardiovascular Disease | Admitting: Cardiovascular Disease

## 2015-07-08 DIAGNOSIS — I2102 ST elevation (STEMI) myocardial infarction involving left anterior descending coronary artery: Secondary | ICD-10-CM | POA: Diagnosis not present

## 2015-07-09 ENCOUNTER — Other Ambulatory Visit (INDEPENDENT_AMBULATORY_CARE_PROVIDER_SITE_OTHER): Payer: Medicare Other

## 2015-07-09 ENCOUNTER — Encounter: Payer: Medicare Other | Admitting: Internal Medicine

## 2015-07-09 DIAGNOSIS — Z0000 Encounter for general adult medical examination without abnormal findings: Secondary | ICD-10-CM | POA: Diagnosis not present

## 2015-07-09 LAB — CBC WITH DIFFERENTIAL/PLATELET
Basophils Absolute: 0 10*3/uL (ref 0.0–0.1)
Basophils Relative: 0.3 % (ref 0.0–3.0)
EOS ABS: 0.1 10*3/uL (ref 0.0–0.7)
Eosinophils Relative: 2.1 % (ref 0.0–5.0)
HCT: 39.7 % (ref 36.0–46.0)
HEMOGLOBIN: 13 g/dL (ref 12.0–15.0)
LYMPHS ABS: 1.1 10*3/uL (ref 0.7–4.0)
Lymphocytes Relative: 19.7 % (ref 12.0–46.0)
MCHC: 32.7 g/dL (ref 30.0–36.0)
MCV: 85.4 fl (ref 78.0–100.0)
MONO ABS: 0.4 10*3/uL (ref 0.1–1.0)
Monocytes Relative: 7.2 % (ref 3.0–12.0)
NEUTROS PCT: 70.7 % (ref 43.0–77.0)
Neutro Abs: 3.9 10*3/uL (ref 1.4–7.7)
Platelets: 292 10*3/uL (ref 150.0–400.0)
RBC: 4.65 Mil/uL (ref 3.87–5.11)
RDW: 13.9 % (ref 11.5–15.5)
WBC: 5.5 10*3/uL (ref 4.0–10.5)

## 2015-07-09 LAB — POC URINALSYSI DIPSTICK (AUTOMATED)
Bilirubin, UA: NEGATIVE
Blood, UA: NEGATIVE
GLUCOSE UA: NEGATIVE
KETONES UA: NEGATIVE
Nitrite, UA: NEGATIVE
Protein, UA: NEGATIVE
SPEC GRAV UA: 1.02
UROBILINOGEN UA: 0.2
pH, UA: 6

## 2015-07-09 LAB — BASIC METABOLIC PANEL
BUN: 18 mg/dL (ref 6–23)
CO2: 28 mEq/L (ref 19–32)
CREATININE: 0.97 mg/dL (ref 0.40–1.20)
Calcium: 9.4 mg/dL (ref 8.4–10.5)
Chloride: 106 mEq/L (ref 96–112)
GFR: 60.62 mL/min (ref >60.00)
GLUCOSE: 109 mg/dL — AB (ref 70–99)
Potassium: 4.4 mEq/L (ref 3.5–5.1)
Sodium: 142 mEq/L (ref 135–145)

## 2015-07-09 LAB — HEPATIC FUNCTION PANEL
ALT: 33 U/L (ref 0–35)
AST: 30 U/L (ref 0–37)
Albumin: 4.1 g/dL (ref 3.5–5.2)
Alkaline Phosphatase: 43 U/L (ref 39–117)
BILIRUBIN DIRECT: 0.1 mg/dL (ref 0.0–0.3)
BILIRUBIN TOTAL: 0.4 mg/dL (ref 0.2–1.2)
Total Protein: 6.5 g/dL (ref 6.0–8.3)

## 2015-07-09 LAB — LIPID PANEL
CHOL/HDL RATIO: 3
CHOLESTEROL: 153 mg/dL (ref 0–200)
HDL: 48.4 mg/dL (ref >39.00)
LDL CALC: 73 mg/dL (ref 0–99)
NonHDL: 104.91
TRIGLYCERIDES: 158 mg/dL — AB (ref 0.0–149.0)
VLDL: 31.6 mg/dL (ref 0.0–40.0)

## 2015-07-09 LAB — TSH: TSH: 2.99 u[IU]/mL (ref 0.35–4.50)

## 2015-07-10 ENCOUNTER — Encounter (HOSPITAL_COMMUNITY): Payer: Medicare Other

## 2015-07-11 ENCOUNTER — Other Ambulatory Visit: Payer: Self-pay | Admitting: Internal Medicine

## 2015-07-13 ENCOUNTER — Encounter (HOSPITAL_COMMUNITY)
Admission: RE | Admit: 2015-07-13 | Discharge: 2015-07-13 | Disposition: A | Discharge status: Home / Self Care | Payer: Medicare Other | Source: Ambulatory Visit | Attending: Cardiovascular Disease | Admitting: Cardiovascular Disease

## 2015-07-13 ENCOUNTER — Ambulatory Visit (INDEPENDENT_AMBULATORY_CARE_PROVIDER_SITE_OTHER): Payer: Medicare Other | Admitting: Internal Medicine

## 2015-07-13 ENCOUNTER — Encounter: Payer: Self-pay | Admitting: Internal Medicine

## 2015-07-13 VITALS — BP 92/78 | HR 76 | Temp 98.5°F | Resp 16 | Ht 61.0 in | Wt 132.0 lb

## 2015-07-13 DIAGNOSIS — E785 Hyperlipidemia, unspecified: Secondary | ICD-10-CM

## 2015-07-13 DIAGNOSIS — G4701 Insomnia due to medical condition: Secondary | ICD-10-CM | POA: Diagnosis not present

## 2015-07-13 DIAGNOSIS — Z0000 Encounter for general adult medical examination without abnormal findings: Secondary | ICD-10-CM

## 2015-07-13 DIAGNOSIS — I10 Essential (primary) hypertension: Secondary | ICD-10-CM

## 2015-07-13 DIAGNOSIS — I5022 Chronic systolic (congestive) heart failure: Secondary | ICD-10-CM

## 2015-07-13 DIAGNOSIS — I2102 ST elevation (STEMI) myocardial infarction involving left anterior descending coronary artery: Secondary | ICD-10-CM | POA: Diagnosis not present

## 2015-07-13 MED ORDER — TEMAZEPAM 30 MG PO CAPS: 30.0000 mg | ORAL_CAPSULE | Freq: Every evening | ORAL | Status: DC | PRN

## 2015-07-13 NOTE — Progress Notes (Signed)
Pre visit review using our clinic review tool, if applicable. No additional management support is needed unless otherwise documented below in the visit note. 

## 2015-07-13 NOTE — Patient Instructions (Addendum)
Continue cardiac rehabilitation in follow-up cardiology  Oncology and GYN consultations as scheduled  Return in one year for follow-up   Limit your sodium (Salt) intake

## 2015-07-13 NOTE — Progress Notes (Signed)
Subjective:    Patient ID: Amanda Wiggins, female    DOB: [birth year], 71 y.o.   MRN: [id number]  HPI  70 year old patient who is seen today for a wellness exam  She is followed closely by cardiology following a STEMI in March.  The patient is status post stenting of the left mid and distal LAD.  Remains on Coumadin, aspirin and Plavix.  She is progressing nicely in cardiac rehabilitation, but still slightly weak.  Her ejection fraction has increased from 30% at the time of her catheterization to 45% prior to her discharge.  She has a history of breast and endometrial cancer and is followed by oncology and OB/GYN  Her statin therapy has been doubled  Past Medical History  Diagnosis Date  . Hyperlipidemia   . Hypertension   . Hx of breast cancer   . GERD (gastroesophageal reflux disease)   . Osteopenia   . Status post aortic valve repair   . FHx: migraine headaches   . Menopausal syndrome   . Aortic valve replaced   . Hiatal hernia   . Hx Breast Cancer, IDC, Stage I 07/03/2003  . 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 Marital Status: Married    Spouse Name: N/A  . Number of Children: N/A  . Years of Education: N/A   Occupational History  . Not on file.   Social History Main Topics  . Smoking status: Never Smoker   . Smokeless tobacco: Never Used  . Alcohol Use: 0.0 oz/week    0 Standard drinks or equivalent per week     Comment: socially  . Drug Use: No  . Sexual Activity: Not on file   Other Topics Concern  . Not on file   Social History Narrative    Past Surgical History  Procedure Laterality Date  . Lummpectomy breast cancer  07/03/2003  . Aortic valve replacement  2003  . Hysterectomy, endometiral cancer  2001  . Right shouler surgery    . Nissan fundoplicaiton  [id number]    post-op hematoma on heparin   . Hiatal hernia repair    . Cardiac catheterization      Ejection Fraction   . Cardiac  catheterization N/A 05/16/2015    Procedure: Left Heart Cath and Coronary Angiography;  Surgeon: Leonie Man, MD;  Location: [facility];  Service: Cardiovascular;  Laterality: N/A;  . Cardiac catheterization  05/16/2015    Procedure: Coronary Stent Intervention;  Surgeon: Leonie Man, MD;  Location: [facility];  Service: Cardiovascular;;  . Cardiac catheterization N/A 05/19/2015    Procedure: Left Heart Cath and Coronary Angiography;  Surgeon: Troy Sine, MD;  Location: [facility];  Service: Cardiovascular;  Laterality: N/A;  . Cardiac catheterization N/A 05/19/2015    Procedure: Coronary Stent Intervention;  Surgeon: Troy Sine, MD;  Location: [facility];  Service: Cardiovascular;  Laterality: N/A;    Family History  Problem Relation Age of Onset  . Heart attack Mother 36  . Osteoarthritis Brother     hpercholesterolemia    Allergies  Allergen Reactions  . Latex Hives  . Meperidine Hcl Nausea And Vomiting  . Sulfamethoxazole Nausea And Vomiting    REACTION: unspecified    Current Outpatient Prescriptions on File Prior to Visit  Medication Sig Dispense Refill  . BIOTIN PO Take 1 tablet by mouth every morning.     Marland Kitchen  Calcium Carbonate-Vitamin D (CALCIUM 600+D) 600-400 MG-UNIT per tablet Take 1 tablet by mouth 2 (two) times daily.      . carvedilol (COREG) 6.25 MG tablet Take 1 tablet (6.25 mg total) by mouth 2 (two) times daily with a meal. 60 tablet 11  . clopidogrel (PLAVIX) 75 MG tablet Take 1 tablet (75 mg total) by mouth daily with breakfast. 90 tablet 3  . cycloSPORINE (RESTASIS) 0.05 % ophthalmic emulsion Place 1 drop into both eyes 2 (two) times daily as needed.     . Glucosamine-Chondroit-Vit C-Mn (GLUCOSAMINE CHONDR 1500 COMPLX) CAPS Take 1 capsule by mouth every morning.     . loratadine (CLARITIN) 10 MG tablet Take 10 mg by mouth daily.      . Multiple Vitamin (MULTIVITAMIN) capsule Take 1 capsule by mouth daily.      .  nitroGLYCERIN (NITROSTAT) 0.4 MG SL tablet Place 1 tablet (0.4 mg total) under the tongue every 5 (five) minutes as needed for chest pain. 25 tablet 2  . pantoprazole (PROTONIX) 40 MG tablet Take 1 tablet (40 mg total) by mouth daily. 30 tablet 6  . rosuvastatin (CRESTOR) 20 MG tablet Take 1 tablet (20 mg total) by mouth daily at 6 PM. 30 tablet 11  . senna (SENOKOT) 8.6 MG tablet Take 2 tablets by mouth daily as needed for constipation.     . temazepam (RESTORIL) 30 MG capsule Take 1 capsule (30 mg total) by mouth at bedtime as needed. 90 capsule 2  . warfarin (COUMADIN) 5 MG tablet Take 1 tablet (5 mg total) by mouth daily at 6 PM. (Patient taking differently: Take 5 mg by mouth daily at 6 PM. Takes 2.5 mg twice a week on Mon Thurs)     No current facility-administered medications on file prior to visit.    BP 92/78 mmHg  Pulse 76  Temp(Src) 98.5 F (36.9 C) (Oral)  Resp 16  Ht 5\' 1"  (1.549 m)  Wt 132 lb (59.875 kg)  BMI 24.95 kg/m2  SpO2 90%   She is followed by multiple consultants including cardiology oncology OB/GYN general surgery and ENT. She is doing reasonably well. She is now Approximately 14 years out from aortic valve replacement surgery. She has osteoporosis secondary to aromatase inhibition. She is now receiving annual reclast injections   Allergies:  1) ! Demerol  2) Sulfamethoxazole (Sulfamethoxazole)   Past History:  Past Medical History:  Hyperlipidemia  Hypertension  Breast cancer, hx of  GERD  Osteoporosis status post aortic valve repair  migraine headaches  menopausal syndrome  Coronary artery disease status post STEMI March 2017  Past Surgical History:  Lumpectomy breast cancer 2005  Aortic Valve Replacement: 2003  Hysterectomy,endometrial cancer 2001  Right shoulder surgery  Nissan fundoplication [id number], post-op hematoma on heparin  colonoscopy 11-08   Family History:   father status post aortic valve surgery  mother status  post MI, age 66  Both parents are deceased, but lived into their nineties One brother, history of hypercholesterolemia, osteoarthritis  one sister  No FH of Colon Cancer   Social History:   married,  Patient has never smoked.  Alcohol Use - socially  Occupation: Retired  Illicit Drug Use - no  Patient gets regular exercise.   Past Medical History  Diagnosis Date  . Hyperlipidemia   . Hypertension   . Hx of breast cancer   . GERD (gastroesophageal reflux disease)   . Osteopenia   . Status post aortic valve repair   . FHx:  migraine headaches   . Menopausal syndrome    Past Surgical History  Procedure Date  . Lummpectomy breast cancer 2005  . Aortic valve replacement 2003  . Hysterectomy, endometiral cancer 2001  . Right shouler surgery   . Nissan fundoplicaiton [id number]    post-op hematoma on heparin    reports that she has never smoked. She has never used smokeless tobacco. She reports that she drinks alcohol. She reports that she does not use illicit drugs. family history includes Heart attack (age of onset:59) in her mother and Osteoarthritis in her brother. Allergies  Allergen Reactions  . Meperidine Hcl   . Sulfamethoxazole     REACTION: unspecified   1. Risk factors, based on past M,S,F history- cardiovascular risk factors include hypertension and dyslipidemia. She is status post aortic valve repair.  Hospitalized March 2017 for STEMI, status post stenting of the mid and distal LAD  2. Physical activities: PresenTLY participating in cardiac rehabilitation  3. Depression/mood: No history of depression or mood disorder  4. Hearing: No deficits  5. ADL's: Independent in all aspects of daily living  6. Fall risk: Low  7. Home safety: No problems identified  8. Height weight, and visual acuity; height and weight stable no change in visual acuity  9. Counseling: Regular exercise  program encouraged  10. Lab orders based on risk factors: Laboratory profile reviewed  11. Referral : Followup GYN cardiology oncology  12. Care plan: Continue present regimen. Follow multiple consultants  13. Cognitive assessment: Alert and oriented with normal affect. No cognitive dysfunction  14. Preventive services will include annual clinical exams with primary care medicine. She is also followed by oncology and cardiology at least annually. Serial bone density studies will be obtained due to her osteopenia  15. Provider list includes OB/GYN cardiology, primary care GI and oncology as well as radiology          Review of Systems  Constitutional: Negative for fever, appetite change, fatigue and unexpected weight change.  HENT: Negative for congestion, dental problem, ear pain, hearing loss, mouth sores, nosebleeds, sinus pressure, sore throat, tinnitus, trouble swallowing and voice change.   Eyes: Negative for photophobia, pain, redness and visual disturbance.  Respiratory: Negative for cough, chest tightness and shortness of breath.   Cardiovascular: Negative for chest pain, palpitations and leg swelling.  Gastrointestinal: Negative for nausea, vomiting, abdominal pain, diarrhea, constipation, blood in stool, abdominal distention and rectal pain.  Genitourinary: Negative for dysuria, urgency, frequency, hematuria, flank pain, vaginal bleeding, vaginal discharge, difficulty urinating, genital sores, vaginal pain, menstrual problem and pelvic pain.  Musculoskeletal: Negative for back pain, arthralgias and neck stiffness.  Skin: Negative for rash.  Neurological: Negative for dizziness, syncope, speech difficulty, weakness, light-headedness, numbness and headaches.  Hematological: Negative for adenopathy. Bruises/bleeds easily.  Psychiatric/Behavioral: Negative for suicidal ideas, behavioral problems, self-injury, dysphoric mood and agitation. The patient is nervous/anxious.         Objective:   Physical Exam  Constitutional: She is oriented to person, place, and time. She appears well-developed and well-nourished.  Blood pressure on arrival 92 over 78 Repeat blood pressure 120/72  HENT:  Head: Normocephalic.  Right Ear: External ear normal.  Left Ear: External ear normal.  Mouth/Throat: Oropharynx is clear and moist.  Eyes: Conjunctivae and EOM are normal. Pupils are equal, round, and reactive to light.  Neck: Normal range of motion. Neck supple. No thyromegaly present.  Cardiovascular: Normal rate, regular rhythm, normal heart sounds and intact distal pulses.   Sternotomy  scar Grade 2/6 systolic murmur Prosthetic heart sounds Rhythm regular  Pulmonary/Chest: Effort normal and breath sounds normal.  Abdominal: Soft. Bowel sounds are normal. She exhibits no mass. There is no tenderness.  Musculoskeletal: Normal range of motion.  Lymphadenopathy:    She has no cervical adenopathy.  Neurological: She is alert and oriented to person, place, and time.  Skin: Skin is warm and dry. No rash noted.  Scattered ecchymoses  Psychiatric: She has a normal mood and affect. Her behavior is normal.          Assessment & Plan:   Preventive health exam Coronary artery disease status post STEMI Ischemic retinopathy, improved.  Follow-up cardiology.  Continue cardiac rehabilitation Status post aVR.  Continue Coumadin anticoagulation Essential hypertension.  Patient unable to tolerate Ace/ARB due to low blood pressure History of breast cancer.  Continue biannual follow-up with oncology History of endometrial cancer.  Follow-up gynecology

## 2015-07-14 ENCOUNTER — Other Ambulatory Visit: Payer: Self-pay | Admitting: Internal Medicine

## 2015-07-15 ENCOUNTER — Encounter (HOSPITAL_COMMUNITY)
Admission: RE | Admit: 2015-07-15 | Discharge: 2015-07-15 | Disposition: A | Discharge status: Home / Self Care | Payer: Medicare Other | Source: Ambulatory Visit | Attending: Cardiovascular Disease | Admitting: Cardiovascular Disease

## 2015-07-15 DIAGNOSIS — I2102 ST elevation (STEMI) myocardial infarction involving left anterior descending coronary artery: Secondary | ICD-10-CM | POA: Diagnosis not present

## 2015-07-17 ENCOUNTER — Encounter (HOSPITAL_COMMUNITY)
Admission: RE | Admit: 2015-07-17 | Discharge: 2015-07-17 | Disposition: A | Discharge status: Home / Self Care | Payer: Medicare Other | Source: Ambulatory Visit | Attending: Cardiovascular Disease | Admitting: Cardiovascular Disease

## 2015-07-17 DIAGNOSIS — I2102 ST elevation (STEMI) myocardial infarction involving left anterior descending coronary artery: Secondary | ICD-10-CM | POA: Diagnosis not present

## 2015-07-20 ENCOUNTER — Encounter (HOSPITAL_COMMUNITY)
Admission: RE | Admit: 2015-07-20 | Discharge: 2015-07-20 | Disposition: A | Discharge status: Home / Self Care | Payer: Medicare Other | Source: Ambulatory Visit | Attending: Cardiovascular Disease | Admitting: Cardiovascular Disease

## 2015-07-20 DIAGNOSIS — I2102 ST elevation (STEMI) myocardial infarction involving left anterior descending coronary artery: Secondary | ICD-10-CM | POA: Diagnosis not present

## 2015-07-22 ENCOUNTER — Encounter (HOSPITAL_COMMUNITY)
Admission: RE | Admit: 2015-07-22 | Discharge: 2015-07-22 | Disposition: A | Discharge status: Home / Self Care | Payer: Medicare Other | Source: Ambulatory Visit | Attending: Cardiovascular Disease | Admitting: Cardiovascular Disease

## 2015-07-22 DIAGNOSIS — I2102 ST elevation (STEMI) myocardial infarction involving left anterior descending coronary artery: Secondary | ICD-10-CM | POA: Diagnosis not present

## 2015-07-24 ENCOUNTER — Encounter (HOSPITAL_COMMUNITY)
Admission: RE | Admit: 2015-07-24 | Discharge: 2015-07-24 | Disposition: A | Discharge status: Home / Self Care | Payer: Medicare Other | Source: Ambulatory Visit | Attending: Cardiovascular Disease | Admitting: Cardiovascular Disease

## 2015-07-24 DIAGNOSIS — I2102 ST elevation (STEMI) myocardial infarction involving left anterior descending coronary artery: Secondary | ICD-10-CM | POA: Diagnosis not present

## 2015-07-27 ENCOUNTER — Encounter (HOSPITAL_COMMUNITY)
Admission: RE | Admit: 2015-07-27 | Discharge: 2015-07-27 | Disposition: A | Discharge status: Home / Self Care | Payer: Medicare Other | Source: Ambulatory Visit | Attending: Cardiovascular Disease | Admitting: Cardiovascular Disease

## 2015-07-27 DIAGNOSIS — I2102 ST elevation (STEMI) myocardial infarction involving left anterior descending coronary artery: Secondary | ICD-10-CM | POA: Diagnosis not present

## 2015-07-29 ENCOUNTER — Encounter (HOSPITAL_COMMUNITY)
Admission: RE | Admit: 2015-07-29 | Discharge: 2015-07-29 | Disposition: A | Discharge status: Home / Self Care | Payer: Medicare Other | Source: Ambulatory Visit | Attending: Cardiovascular Disease | Admitting: Cardiovascular Disease

## 2015-07-29 ENCOUNTER — Ambulatory Visit (INDEPENDENT_AMBULATORY_CARE_PROVIDER_SITE_OTHER): Payer: Medicare Other | Admitting: *Deleted

## 2015-07-29 DIAGNOSIS — Z954 Presence of other heart-valve replacement: Secondary | ICD-10-CM

## 2015-07-29 DIAGNOSIS — Z7901 Long term (current) use of anticoagulants: Secondary | ICD-10-CM

## 2015-07-29 DIAGNOSIS — I359 Nonrheumatic aortic valve disorder, unspecified: Secondary | ICD-10-CM | POA: Diagnosis not present

## 2015-07-29 DIAGNOSIS — Z5181 Encounter for therapeutic drug level monitoring: Secondary | ICD-10-CM

## 2015-07-29 DIAGNOSIS — I2102 ST elevation (STEMI) myocardial infarction involving left anterior descending coronary artery: Secondary | ICD-10-CM | POA: Diagnosis not present

## 2015-07-29 DIAGNOSIS — Z952 Presence of prosthetic heart valve: Secondary | ICD-10-CM

## 2015-07-29 LAB — POCT INR: INR: 2.5

## 2015-07-29 NOTE — Progress Notes (Signed)
Daily Session Note  Patient Details  Name: Amanda Wiggins MRN: [id number] Date of Birth: 05-24-1945 Referring Provider:    Encounter Date: 07/29/2015  Check In:     Session Check In - 07/29/15 0930    Check-In   Location [facility]   Staff Present Brianna Bennett Angelina Pih, MS, EP, [facility], Exercise Physiologist;Christy Oletta Lamas, RN, BSN;Other  Nils Flack, EP   Supervising physician immediately available to respond to emergencies See telemetry face sheet for immediately available MD   Medication changes reported     No   Fall or balance concerns reported    No   Warm-up and Cool-down Performed as group-led instruction   Resistance Training Performed Yes   VAD Patient? No   Pain Assessment   Currently in Pain? No/denies   Pain Score 0-No pain   Multiple Pain Sites No      Capillary Blood Glucose: Results for orders placed or performed in visit on 07/29/15 (from the past 24 hour(s))  POCT INR     Status: Normal   Collection Time: 07/29/15  8:48 AM  Result Value Ref Range   INR 2.5      Goals Met:  Independence with exercise equipment Improved SOB with ADL's Exercise tolerated well No report of cardiac concerns or symptoms Strength training completed today  Goals Unmet:  RPE BP HR  Comments: Patient is progressing appropriately. Check out: 10:30am.   Dr. Kate Sable is Medical Director for [facility] and [facility].

## 2015-07-31 ENCOUNTER — Encounter (HOSPITAL_COMMUNITY)
Admission: RE | Admit: 2015-07-31 | Discharge: 2015-07-31 | Disposition: A | Discharge status: Home / Self Care | Payer: Medicare Other | Source: Ambulatory Visit | Attending: Cardiovascular Disease | Admitting: Cardiovascular Disease

## 2015-07-31 ENCOUNTER — Telehealth: Payer: Self-pay | Admitting: Cardiovascular Disease

## 2015-07-31 DIAGNOSIS — I2102 ST elevation (STEMI) myocardial infarction involving left anterior descending coronary artery: Secondary | ICD-10-CM

## 2015-07-31 DIAGNOSIS — Z955 Presence of coronary angioplasty implant and graft: Secondary | ICD-10-CM

## 2015-07-31 NOTE — Progress Notes (Signed)
Daily Session Note  Patient Details  Name: LYNNEX FULP MRN: [id number] Date of Birth: June 14, 1945 Referring Provider:    Encounter Date: 07/31/2015  Check In:     Session Check In - 07/31/15 0930    Check-In   Location [facility]   Staff Present Chessa Barrasso Angelina Pih, MS, EP, [facility], Exercise Physiologist;Christy Oletta Lamas, RN, BSN;Other  Nils Flack, EP   Supervising physician immediately available to respond to emergencies See telemetry face sheet for immediately available MD   Medication changes reported     No   Fall or balance concerns reported    No   Warm-up and Cool-down Performed as group-led instruction   Resistance Training Performed Yes   VAD Patient? No   Pain Assessment   Currently in Pain? No/denies   Pain Score 0-No pain   Multiple Pain Sites No      Capillary Blood Glucose: No results found for this or any previous visit (from the past 24 hour(s)).   Goals Met:  Independence with exercise equipment Exercise tolerated well No report of cardiac concerns or symptoms Strength training completed today  Goals Unmet:  RPE BP HR  Comments: Patient is progressing appropriately. Check out: 1030   Dr. Kate Sable is Medical Director for [facility].

## 2015-07-31 NOTE — Telephone Encounter (Signed)
Spoke with patient to clarify paperwork that we received from her for a supplemental health insurance policy. She asked me to mail completed form back to her home and thanked me for the call.

## 2015-07-31 NOTE — Telephone Encounter (Signed)
Pt is returning your call

## 2015-08-02 NOTE — Progress Notes (Signed)
Cardiac Individual Treatment Plan  Patient Details  Name: Amanda Wiggins MRN: [id number] Date of Birth: [birth year] Referring Provider:    Initial Encounter Date:       CARDIAC REHAB PHASE II ORIENTATION from 06/17/2015 in [place]   Date  06/17/15      Visit Diagnosis: ST elevation (STEMI) myocardial infarction involving left anterior descending coronary artery (HCC)  Stented coronary artery  Patient's Home Medications on Admission:  Current outpatient prescriptions:  .  aspirin 81 MG chewable tablet, Chew 81 mg by mouth daily., Disp: , Rfl:  .  BIOTIN PO, Take 1 tablet by mouth every morning. , Disp: , Rfl:  .  Calcium Carbonate-Vitamin D (CALCIUM 600+D) 600-400 MG-UNIT per tablet, Take 1 tablet by mouth 2 (two) times daily.  , Disp: , Rfl:  .  carvedilol (COREG) 6.25 MG tablet, Take 1 tablet (6.25 mg total) by mouth 2 (two) times daily with a meal., Disp: 60 tablet, Rfl: 11 .  clopidogrel (PLAVIX) 75 MG tablet, Take 1 tablet (75 mg total) by mouth daily with breakfast., Disp: 90 tablet, Rfl: 3 .  cycloSPORINE (RESTASIS) 0.05 % ophthalmic emulsion, Place 1 drop into both eyes 2 (two) times daily as needed. , Disp: , Rfl:  .  Glucosamine-Chondroit-Vit C-Mn (GLUCOSAMINE CHONDR 1500 COMPLX) CAPS, Take 1 capsule by mouth every morning. , Disp: , Rfl:  .  loratadine (CLARITIN) 10 MG tablet, Take 10 mg by mouth daily.  , Disp: , Rfl:  .  Multiple Vitamin (MULTIVITAMIN) capsule, Take 1 capsule by mouth daily.  , Disp: , Rfl:  .  nitroGLYCERIN (NITROSTAT) 0.4 MG SL tablet, Place 1 tablet (0.4 mg total) under the tongue every 5 (five) minutes as needed for chest pain., Disp: 25 tablet, Rfl: 2 .  pantoprazole (PROTONIX) 40 MG tablet, Take 1 tablet (40 mg total) by mouth daily., Disp: 30 tablet, Rfl: 6 .  rosuvastatin (CRESTOR) 20 MG tablet, Take 1 tablet (20 mg total) by mouth daily at 6 PM., Disp: 30 tablet, Rfl: 11 .  senna (SENOKOT) 8.6 MG tablet, Take 2 tablets by  mouth daily as needed for constipation. , Disp: , Rfl:  .  temazepam (RESTORIL) 30 MG capsule, TAKE 1 CAPSULE BY MOUTH AT BEDTIME AS NEEDED, Disp: 90 capsule, Rfl: 1 .  warfarin (COUMADIN) 5 MG tablet, Take 1 tablet (5 mg total) by mouth daily at 6 PM. (Patient taking differently: Take 5 mg by mouth daily at 6 PM. Takes 2.5 mg twice a week on Mon Thurs), Disp: , Rfl:   Past Medical History: Past Medical History  Diagnosis Date  . Hyperlipidemia   . Hypertension   . Hx of breast cancer   . GERD (gastroesophageal reflux disease)   . Osteopenia   . Status post aortic valve repair   . FHx: migraine headaches   . Menopausal syndrome   . Aortic valve replaced   . Hiatal hernia   . Hx Breast Cancer, IDC, Stage I 07/03/2003  . 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 Never Smoker   Smokeless tobacco  . Never Used    Labs:     Recent Review Flowsheet Data    Labs for ITP Cardiac and Pulmonary Rehab Latest Ref Rng 12/30/2013 06/23/2014 05/16/2015 05/16/2015 07/09/2015   Cholestrol 0 - 200 mg/dL 177 172 - - 153   LDLCALC 0 - 99 mg/dL 88 79 - - 73   HDL >  39.00 mg/dL 54.60 55.80 - - 48.40   Trlycerides 0.0 - 149.0 mg/dL 170.0(H) 184.0(H) - - 158.0(H)   TCO2 0 - 100 mmol/L - - 21 22 -      Capillary Blood Glucose: No results found for: GLUCAP   Exercise Target Goals:    Exercise Program Goal: Individual exercise prescription set with THRR, safety & activity barriers. Participant demonstrates ability to understand and report RPE using BORG scale, to self-measure pulse accurately, and to acknowledge the importance of the exercise prescription.  Exercise Prescription Goal: Starting with aerobic activity 30 plus minutes a day, 3 days per week for initial exercise prescription. Provide home exercise prescription and guidelines that participant acknowledges understanding prior to discharge.  Activity Barriers & Risk Stratification:      Activity Barriers & Cardiac Risk Stratification - 06/17/15 1137    Activity Barriers & Cardiac Risk Stratification   Activity Barriers None   Cardiac Risk Stratification High      6 Minute Walk:     6 Minute Walk      06/17/15 1440       6 Minute Walk   Phase Initial     Distance 650 feet     Walk Time 6 minutes     # of Rest Breaks 0     MPH 1.23     METS 1.94     RPE 11     Perceived Dyspnea  13     VO2 Peak 5.37     Symptoms No     Resting HR 70 bpm     Resting BP 92/68 mmHg     Max Ex. HR 79 bpm     Max Ex. BP 120/84 mmHg     2 Minute Post BP 98/60 mmHg        Initial Exercise Prescription:     Initial Exercise Prescription - 06/17/15 1400    Date of Initial Exercise RX and Referring Provider   Date 06/17/15   Treadmill   MPH 1.5   Grade 0   Minutes 15   METs 2.15   NuStep   Level 2   Minutes 15   METs 1.5   Prescription Details   Frequency (times per week) 3   Duration Progress to 30 minutes of continuous aerobic without signs/symptoms of physical distress   Intensity   THRR REST +  30   THRR 40-80% of Max Heartrate 103-136   Ratings of Perceived Exertion 11-13   Perceived Dyspnea 0-4   Progression   Progression Continue to progress workloads to maintain intensity without signs/symptoms of physical distress.   Resistance Training   Training Prescription Yes   Weight 1   Reps 10-12      Perform Capillary Blood Glucose checks as needed.  Exercise Prescription Changes:      Exercise Prescription Changes      07/03/15 1900 08/02/15 1100         Exercise Review   Progression Yes Yes      Response to Exercise   Blood Pressure (Admit) 92/50 mmHg 90/58 mmHg      Blood Pressure (Exercise) 92/60 mmHg 108/60 mmHg      Blood Pressure (Exit) 88/60 mmHg 110/60 mmHg      Heart Rate (Admit) 78 bpm 61 bpm      Heart Rate (Exercise) 93 bpm 81 bpm      Heart Rate (Exit) 84 bpm 70 bpm      Rating of Perceived Exertion (Exercise)  13       Symptoms No No      Duration Progress to 30 minutes of continuous aerobic without signs/symptoms of physical distress Progress to 30 minutes of continuous aerobic without signs/symptoms of physical distress      Intensity  Rest + 30      Progression   Progression Continue to progress workloads to maintain intensity without signs/symptoms of physical distress. Continue to progress workloads to maintain intensity without signs/symptoms of physical distress.      Resistance Training   Training Prescription Yes Yes      Weight 1.0 2.0      Reps 10-12 10-12      Treadmill   MPH 2.7 2.8      Grade 0 0.5      Minutes 15 15      METs 3.1 3.33      NuStep   Level 2 2      Minutes 15 15      METs 1.8 1.9      Home Exercise Plan   Plans to continue exercise at [facility] (comment)      Frequency Add 2 additional days to program exercise sessions.          Exercise Comments:      Exercise Comments      07/03/15 1930 08/02/15 1156         Exercise Comments Patient is progressing appropriately. Patient is progressing appropriately.          Discharge Exercise Prescription (Final Exercise Prescription Changes):     Exercise Prescription Changes - 08/02/15 1100    Exercise Review   Progression Yes   Response to Exercise   Blood Pressure (Admit) 90/58 mmHg   Blood Pressure (Exercise) 108/60 mmHg   Blood Pressure (Exit) 110/60 mmHg   Heart Rate (Admit) 61 bpm   Heart Rate (Exercise) 81 bpm   Heart Rate (Exit) 70 bpm   Rating of Perceived Exertion (Exercise) 13   Symptoms No   Duration Progress to 30 minutes of continuous aerobic without signs/symptoms of physical distress   Intensity Rest + 30   Progression   Progression Continue to progress workloads to maintain intensity without signs/symptoms of physical distress.   Resistance Training   Training Prescription Yes   Weight 2.0   Reps 10-12   Treadmill   MPH 2.8   Grade 0.5   Minutes 15   METs 3.33    NuStep   Level 2   Minutes 15   METs 1.9   Home Exercise Plan   Plans to continue exercise at [facility] (comment)      Nutrition:  Target Goals: Understanding of nutrition guidelines, daily intake of sodium 1500mg , cholesterol 200mg , calories 30% from fat and 7% or less from saturated fats, daily to have 5 or more servings of fruits and vegetables.  Biometrics:     Pre Biometrics - 06/17/15 1446    Pre Biometrics   Waist Circumference 33.5 inches   Hip Circumference 38 inches   Waist to Hip Ratio 0.88 %   Triceps Skinfold 11 mm   % Body Fat 32.8 %   Grip Strength 47 kg   Flexibility 0 in  unable to test due to flex box unavailable   Single Leg Stand 18 seconds       Nutrition Therapy Plan and Nutrition Goals:     Nutrition Therapy & Goals - 06/17/15 1138    Nutrition Therapy  Diet Heart Healthy diet with low sodium.    Intervention Plan   Intervention Nutrition handout(s) given to patient.   Expected Outcomes Short Term Goal: Understand basic principles of dietary content, such as calories, fat, sodium, cholesterol and nutrients.      Nutrition Discharge: Rate Your Plate Scores:     Nutrition Assessments - 06/17/15 1144    MEDFICTS Scores   Pre Score 12      Nutrition Goals Re-Evaluation:     Nutrition Goals Re-Evaluation      07/03/15 0844           Intervention Plan   Intervention Continue to educate, counsel and set short/long term goals regarding individualized specific personal dietary modifications.          Psychosocial: Target Goals: Acknowledge presence or absence of depression, maximize coping skills, provide positive support system. Participant is able to verbalize types and ability to use techniques and skills needed for reducing stress and depression.  Initial Review & Psychosocial Screening:     Initial Psych Review & Screening - 07/03/15 0848    Initial Review   Current issues with Current Stress Concerns       Quality of Life Scores:     Quality of Life - 06/17/15 1448    Quality of Life Scores   Health/Function Pre 24.8 %   Socioeconomic Pre 27.36 %   Psych/Spiritual Pre 30 %   Family Pre 26.4 %   GLOBAL Pre 26.63 %      PHQ-9:     Recent Review Flowsheet Data    Depression screen [facility] 2/9 06/17/2015 07/07/2014 06/20/2013   Decreased Interest 0 (No Data)  0   Down, Depressed, Hopeless 1 (No Data)  0   PHQ - 2 Score 1 - 0   Altered sleeping 0 - -   Tired, decreased energy 3 - -   Change in appetite 0 - -   Feeling bad or failure about yourself  0 - -   Trouble concentrating 0 - -   Moving slowly or fidgety/restless 0 - -   Suicidal thoughts 0 - -   PHQ-9 Score 4 - -   Difficult doing work/chores Somewhat difficult - -      Psychosocial Evaluation and Intervention:     Psychosocial Evaluation - 06/17/15 1145    Psychosocial Evaluation & Interventions   Continued Psychosocial Services Needed No      Psychosocial Re-Evaluation:     Psychosocial Re-Evaluation      07/03/15 0848 07/27/15 1505         Psychosocial Re-Evaluation   Interventions Encouraged to attend Cardiac Rehabilitation for the exercise Encouraged to attend Cardiac Rehabilitation for the exercise      Continued Psychosocial Services Needed No No         Vocational Rehabilitation: Provide vocational rehab assistance to qualifying candidates.   Vocational Rehab Evaluation & Intervention:     Vocational Rehab - 06/17/15 1138    Initial Vocational Rehab Evaluation & Intervention   Assessment shows need for Vocational Rehabilitation No      Education: Education Goals: Education classes will be provided on a weekly basis, covering required topics. Participant will state understanding/return demonstration of topics presented.  Learning Barriers/Preferences:     Learning Barriers/Preferences - 06/17/15 1137    Learning Barriers/Preferences   Learning Barriers None   Learning Preferences  Video;Pictoral;Individual Instruction      Education Topics: Hypertension, Hypertension Reduction -Define heart disease and high blood pressure.  Discus how high blood pressure affects the body and ways to reduce high blood pressure.      CARDIAC REHAB PHASE II EXERCISE from 07/29/2015 in [place]   Date  07/08/15   Educator  Russella Dar   Instruction Review Code  2- meets goals/outcomes      Exercise and Your Heart -Discuss why it is important to exercise, the FITT principles of exercise, normal and abnormal responses to exercise, and how to exercise safely.      CARDIAC REHAB PHASE II EXERCISE from 07/29/2015 in [place]   Date  07/15/15   Educator  Russella Dar   Instruction Review Code  2- meets goals/outcomes      Angina -Discuss definition of angina, causes of angina, treatment of angina, and how to decrease risk of having angina.      CARDIAC REHAB PHASE II EXERCISE from 07/29/2015 in [place]   Date  07/22/15   Educator  D Gabrien Mentink   Instruction Review Code  2- meets goals/outcomes      Cardiac Medications -Review what the following cardiac medications are used for, how they affect the body, and side effects that may occur when taking the medications.  Medications include Aspirin, Beta blockers, calcium channel blockers, ACE Inhibitors, angiotensin receptor blockers, diuretics, digoxin, and antihyperlipidemics.      CARDIAC REHAB PHASE II EXERCISE from 07/29/2015 in [place]   Date  07/29/15   Educator  D. Nea Gittens   Instruction Review Code  2- meets goals/outcomes      Congestive Heart Failure -Discuss the definition of CHF, how to live with CHF, the signs and symptoms of CHF, and how keep track of weight and sodium intake.   Heart Disease and Intimacy -Discus the effect sexual activity has on the heart, how changes occur during intimacy as we age, and safety during sexual  activity.   Smoking Cessation / COPD -Discuss different methods to quit smoking, the health benefits of quitting smoking, and the definition of COPD.   Nutrition I: Fats -Discuss the types of cholesterol, what cholesterol does to the heart, and how cholesterol levels can be controlled.   Nutrition II: Labels -Discuss the different components of food labels and how to read food label   Heart Parts and Heart Disease -Discuss the anatomy of the heart, the pathway of blood circulation through the heart, and these are affected by heart disease.   Stress I: Signs and Symptoms -Discuss the causes of stress, how stress may lead to anxiety and depression, and ways to limit stress.   Stress II: Relaxation -Discuss different types of relaxation techniques to limit stress.      CARDIAC REHAB PHASE II EXERCISE from 07/29/2015 in [place]   Date  06/24/15   Educator  D Talicia Sui   Instruction Review Code  2- meets goals/outcomes      Warning Signs of Stroke / TIA -Discuss definition of a stroke, what the signs and symptoms are of a stroke, and how to identify when someone is having stroke.          CARDIAC REHAB PHASE II EXERCISE from 07/29/2015 in [place]   Date  07/01/15   Educator  Russella Dar   Instruction Review Code  2- meets goals/outcomes      Knowledge Questionnaire Score:     Knowledge Questionnaire Score - 06/17/15 1138    Knowledge Questionnaire Score   Pre  Score 24/24      Core Components/Risk Factors/Patient Goals at Admission:     Personal Goals and Risk Factors at Admission - 06/17/15 1139    Core Components/Risk Factors/Patient Goals on Admission   Increase Strength and Stamina Yes   Intervention Provide advice, education, support and counseling about physical activity/exercise needs.;Develop an individualized exercise prescription for aerobic and resistive training based on initial evaluation findings, risk  stratification, comorbidities and participant's personal goals.   Expected Outcomes Achievement of increased cardiorespiratory fitness and enhanced flexibility, muscular endurance and strength shown through measurements of functional capacity and personal statement of participant.   Tobacco Cessation --  Never smoked   Diabetes --  N/A   Lipids Yes   Intervention Provide education and support for participant on nutrition & aerobic/resistive exercise along with prescribed medications to achieve LDL 70mg , HDL >40mg .   Expected Outcomes Short Term: Participant states understanding of desired cholesterol values and is compliant with medications prescribed. Participant is following exercise prescription and nutrition guidelines.;Long Term: Cholesterol controlled with medications as prescribed, with individualized exercise RX and with personalized nutrition plan. Value goals: LDL < 70mg , HDL > 40 mg.   Personal Goal Other Yes   Personal Goal Increase energy, do things w/o SOB or tightness   Intervention Get back to normal activities   Expected Outcomes Feel better overall      Core Components/Risk Factors/Patient Goals Review:      Goals and Risk Factor Review      07/03/15 0845 07/27/15 1505         Core Components/Risk Factors/Patient Goals Review   Personal Goals Review Increase Strength and Stamina;Lipids Increase Strength and Stamina;Lipids      Review Will continue to progress patient and educate Will continue to progress patient and educate. patient states she feels better since starting program      Expected Outcomes Have more energy Have more energy         Core Components/Risk Factors/Patient Goals at Discharge (Final Review):      Goals and Risk Factor Review - 07/27/15 1505    Core Components/Risk Factors/Patient Goals Review   Personal Goals Review Increase Strength and Stamina;Lipids   Review Will continue to progress patient and educate. patient states she feels better  since starting program   Expected Outcomes Have more energy      ITP Comments:   Comments: Patient is doing well in the program. We will continue to monitor for progress.

## 2015-08-03 ENCOUNTER — Encounter (HOSPITAL_COMMUNITY): Payer: Medicare Other

## 2015-08-05 ENCOUNTER — Encounter (HOSPITAL_COMMUNITY)
Admission: RE | Admit: 2015-08-05 | Discharge: 2015-08-05 | Disposition: A | Discharge status: Home / Self Care | Payer: Medicare Other | Source: Ambulatory Visit | Attending: Cardiovascular Disease | Admitting: Cardiovascular Disease

## 2015-08-05 DIAGNOSIS — I2102 ST elevation (STEMI) myocardial infarction involving left anterior descending coronary artery: Secondary | ICD-10-CM | POA: Diagnosis not present

## 2015-08-05 NOTE — Progress Notes (Signed)
Daily Session Note  Patient Details  Name: Amanda Wiggins MRN: [id number] Date of Birth: 01/27/1946 Referring Provider:    Encounter Date: 08/05/2015  Check In:     Session Check In - 08/05/15 1112    Check-In   Location [facility]   Staff Present Diane Angelina Pih, MS, EP, [facility], Exercise Physiologist;Amyrie Illingworth Oletta Lamas, RN, BSN;Other   Supervising physician immediately available to respond to emergencies See telemetry face sheet for immediately available MD   Medication changes reported     No   Fall or balance concerns reported    No   Warm-up and Cool-down Performed as group-led instruction   Resistance Training Performed Yes   VAD Patient? No   Pain Assessment   Currently in Pain? No/denies      Capillary Blood Glucose: No results found for this or any previous visit (from the past 24 hour(s)).   Goals Met:  Independence with exercise equipment Exercise tolerated well No report of cardiac concerns or symptoms Strength training completed today  Goals Unmet:  Not Applicable  Comments: check out 1030   Dr. Kate Sable is Medical Director for [place] and Pulmonary Rehab.

## 2015-08-07 ENCOUNTER — Encounter (HOSPITAL_COMMUNITY)
Admission: RE | Admit: 2015-08-07 | Discharge: 2015-08-07 | Disposition: A | Discharge status: Home / Self Care | Payer: Medicare Other | Source: Ambulatory Visit | Attending: Cardiovascular Disease | Admitting: Cardiovascular Disease

## 2015-08-07 DIAGNOSIS — I2102 ST elevation (STEMI) myocardial infarction involving left anterior descending coronary artery: Secondary | ICD-10-CM | POA: Diagnosis not present

## 2015-08-07 DIAGNOSIS — I10 Essential (primary) hypertension: Secondary | ICD-10-CM | POA: Diagnosis not present

## 2015-08-07 DIAGNOSIS — Z853 Personal history of malignant neoplasm of breast: Secondary | ICD-10-CM | POA: Diagnosis not present

## 2015-08-07 DIAGNOSIS — K219 Gastro-esophageal reflux disease without esophagitis: Secondary | ICD-10-CM | POA: Diagnosis not present

## 2015-08-07 DIAGNOSIS — Z955 Presence of coronary angioplasty implant and graft: Secondary | ICD-10-CM | POA: Insufficient documentation

## 2015-08-07 DIAGNOSIS — M810 Age-related osteoporosis without current pathological fracture: Secondary | ICD-10-CM | POA: Insufficient documentation

## 2015-08-07 DIAGNOSIS — Z7902 Long term (current) use of antithrombotics/antiplatelets: Secondary | ICD-10-CM | POA: Insufficient documentation

## 2015-08-07 DIAGNOSIS — Z79899 Other long term (current) drug therapy: Secondary | ICD-10-CM | POA: Insufficient documentation

## 2015-08-07 DIAGNOSIS — Z7982 Long term (current) use of aspirin: Secondary | ICD-10-CM | POA: Diagnosis not present

## 2015-08-07 DIAGNOSIS — E785 Hyperlipidemia, unspecified: Secondary | ICD-10-CM | POA: Insufficient documentation

## 2015-08-07 NOTE — Progress Notes (Signed)
Daily Session Note  Patient Details  Name: Amanda Wiggins MRN: [id number] Date of Birth: 1945-07-29 Referring Provider:    Encounter Date: 08/07/2015  Check In:     Session Check In - 08/07/15 1125    Check-In   Location [facility]   Staff Present Meily Glowacki Angelina Pih, MS, EP, [facility], Exercise Physiologist;Christy Oletta Lamas, RN, BSN;Other   Supervising physician immediately available to respond to emergencies See telemetry face sheet for immediately available MD   Medication changes reported     No   Fall or balance concerns reported    No   Warm-up and Cool-down Performed as group-led instruction   Resistance Training Performed Yes   VAD Patient? No   Pain Assessment   Currently in Pain? No/denies   Pain Score 0-No pain   Multiple Pain Sites No      Capillary Blood Glucose: No results found for this or any previous visit (from the past 24 hour(s)).   Goals Met:  Independence with exercise equipment Exercise tolerated well No report of cardiac concerns or symptoms Strength training completed today  Goals Unmet:  Not Applicable  Comments: Check out: 10:30   Dr. Kate Sable is Medical Director for [place] and Pulmonary Rehab.

## 2015-08-10 ENCOUNTER — Encounter (HOSPITAL_COMMUNITY)
Admission: RE | Admit: 2015-08-10 | Discharge: 2015-08-10 | Disposition: A | Discharge status: Home / Self Care | Payer: Medicare Other | Source: Ambulatory Visit | Attending: Cardiovascular Disease | Admitting: Cardiovascular Disease

## 2015-08-10 DIAGNOSIS — I2102 ST elevation (STEMI) myocardial infarction involving left anterior descending coronary artery: Secondary | ICD-10-CM | POA: Diagnosis not present

## 2015-08-10 NOTE — Progress Notes (Signed)
Daily Session Note  Patient Details  Name: DIANTHA PAXSON MRN: [id number] Date of Birth: 30-Nov-1945 Referring Provider:    Encounter Date: 08/10/2015  Check In:     Session Check In - 08/10/15 0930    Check-In   Location [facility]   Staff Present Elston [place]ape Angelina Pih, MS, EP, [facility], Exercise Physiologist;Christy Oletta Lamas, RN, BSN;Other  Nils Flack, EP   Supervising physician immediately available to respond to emergencies See telemetry face sheet for immediately available MD   Medication changes reported     No   Fall or balance concerns reported    No   Warm-up and Cool-down Performed as group-led instruction   Resistance Training Performed Yes   Pain Assessment   Currently in Pain? No/denies   Pain Score 0-No pain   Multiple Pain Sites No      Capillary Blood Glucose: No results found for this or any previous visit (from the past 24 hour(s)).   Goals Met:  Independence with exercise equipment Exercise tolerated well No report of cardiac concerns or symptoms Strength training completed today  Goals Unmet:  Not Applicable  Comments: Check Out 10:30   Dr. Kate Sable is Medical Director for [place] and Pulmonary Rehab.

## 2015-08-12 ENCOUNTER — Encounter (HOSPITAL_COMMUNITY)
Admission: RE | Admit: 2015-08-12 | Discharge: 2015-08-12 | Disposition: A | Discharge status: Home / Self Care | Payer: Medicare Other | Source: Ambulatory Visit | Attending: Cardiovascular Disease | Admitting: Cardiovascular Disease

## 2015-08-12 DIAGNOSIS — I2102 ST elevation (STEMI) myocardial infarction involving left anterior descending coronary artery: Secondary | ICD-10-CM | POA: Diagnosis not present

## 2015-08-12 NOTE — Progress Notes (Signed)
Daily Session Note  Patient Details  Name: Amanda Wiggins MRN: [id number] Date of Birth: [birth year] Referring Provider:    Encounter Date: 08/12/2015  Check In:     Session Check In - 08/12/15 0930    Check-In   Location [facility]   Staff Present Cashmere Harmes Angelina Pih, MS, EP, [facility], Exercise Physiologist;Christy Oletta Lamas, RN, BSN;Other  [place] physician immediately available to respond to emergencies See telemetry face sheet for immediately available MD   Medication changes reported     No   Fall or balance concerns reported    No   Warm-up and Cool-down Performed as group-led instruction   Resistance Training Performed Yes   VAD Patient? No   Pain Assessment   Currently in Pain? No/denies   Pain Score 0-No pain   Multiple Pain Sites No      Capillary Blood Glucose: No results found for this or any previous visit (from the past 24 hour(s)).   Goals Met:  Independence with exercise equipment Exercise tolerated well No report of cardiac concerns or symptoms Strength training completed today  Goals Unmet:  Not Applicable  Comments: Check out 1030   Dr. Kate Sable is Medical Director for [place] and Pulmonary Rehab.

## 2015-08-13 ENCOUNTER — Other Ambulatory Visit: Payer: Self-pay | Admitting: *Deleted

## 2015-08-13 ENCOUNTER — Other Ambulatory Visit (HOSPITAL_BASED_OUTPATIENT_CLINIC_OR_DEPARTMENT_OTHER): Payer: Medicare Other

## 2015-08-13 ENCOUNTER — Encounter: Payer: Self-pay | Admitting: Hematology & Oncology

## 2015-08-13 ENCOUNTER — Ambulatory Visit (HOSPITAL_BASED_OUTPATIENT_CLINIC_OR_DEPARTMENT_OTHER): Payer: Medicare Other | Admitting: Hematology & Oncology

## 2015-08-13 ENCOUNTER — Ambulatory Visit (HOSPITAL_BASED_OUTPATIENT_CLINIC_OR_DEPARTMENT_OTHER): Payer: Medicare Other

## 2015-08-13 VITALS — BP 120/48 | HR 66 | Temp 98.2°F | Resp 16 | Ht 61.0 in | Wt 134.0 lb

## 2015-08-13 DIAGNOSIS — Z7901 Long term (current) use of anticoagulants: Secondary | ICD-10-CM

## 2015-08-13 DIAGNOSIS — M810 Age-related osteoporosis without current pathological fracture: Secondary | ICD-10-CM | POA: Diagnosis not present

## 2015-08-13 DIAGNOSIS — C549 Malignant neoplasm of corpus uteri, unspecified: Secondary | ICD-10-CM

## 2015-08-13 DIAGNOSIS — Z853 Personal history of malignant neoplasm of breast: Secondary | ICD-10-CM

## 2015-08-13 DIAGNOSIS — I359 Nonrheumatic aortic valve disorder, unspecified: Secondary | ICD-10-CM

## 2015-08-13 DIAGNOSIS — T386X5A Adverse effect of antigonadotrophins, antiestrogens, antiandrogens, not elsewhere classified, initial encounter: Principal | ICD-10-CM

## 2015-08-13 DIAGNOSIS — G4701 Insomnia due to medical condition: Secondary | ICD-10-CM

## 2015-08-13 DIAGNOSIS — M818 Other osteoporosis without current pathological fracture: Secondary | ICD-10-CM

## 2015-08-13 DIAGNOSIS — Z952 Presence of prosthetic heart valve: Secondary | ICD-10-CM | POA: Diagnosis not present

## 2015-08-13 LAB — CMP (CANCER CENTER ONLY)
ALK PHOS: 52 U/L (ref 26–84)
ALT: 40 U/L (ref 10–47)
AST: 39 U/L — AB (ref 11–38)
Albumin: 3.6 g/dL (ref 3.3–5.5)
BILIRUBIN TOTAL: 0.6 mg/dL (ref 0.20–1.60)
BUN: 20 mg/dL (ref 7–22)
CALCIUM: 9.7 mg/dL (ref 8.0–10.3)
CO2: 26 meq/L (ref 18–33)
CREATININE: 1 mg/dL (ref 0.6–1.2)
Chloride: 104 mEq/L (ref 98–108)
GLUCOSE: 173 mg/dL — AB (ref 73–118)
Potassium: 3.9 mEq/L (ref 3.3–4.7)
Sodium: 141 mEq/L (ref 128–145)
Total Protein: 6.7 g/dL (ref 6.4–8.1)

## 2015-08-13 LAB — PROTIME-INR (CHCC SATELLITE)
INR: 2.4 (ref 2.0–3.5)
PROTIME: 28.8 s — AB (ref 10.6–13.4)

## 2015-08-13 LAB — CBC WITH DIFFERENTIAL (CANCER CENTER ONLY)
BASO#: 0 10*3/uL (ref 0.0–0.2)
BASO%: 0.3 % (ref 0.0–2.0)
EOS%: 2.1 % (ref 0.0–7.0)
Eosinophils Absolute: 0.1 10*3/uL (ref 0.0–0.5)
HEMATOCRIT: 42 % (ref 34.8–46.6)
HEMOGLOBIN: 13.4 g/dL (ref 11.6–15.9)
LYMPH#: 1.2 10*3/uL (ref 0.9–3.3)
LYMPH%: 20.7 % (ref 14.0–48.0)
MCH: 28 pg (ref 26.0–34.0)
MCHC: 31.9 g/dL — ABNORMAL LOW (ref 32.0–36.0)
MCV: 88 fL (ref 81–101)
MONO#: 0.4 10*3/uL (ref 0.1–0.9)
MONO%: 6.2 % (ref 0.0–13.0)
NEUT%: 70.7 % (ref 39.6–80.0)
NEUTROS ABS: 4.1 10*3/uL (ref 1.5–6.5)
Platelets: 275 10*3/uL (ref 145–400)
RBC: 4.78 10*6/uL (ref 3.70–5.32)
RDW: 12.9 % (ref 11.1–15.7)
WBC: 5.8 10*3/uL (ref 3.9–10.0)

## 2015-08-13 MED ORDER — ZOLEDRONIC ACID 4 MG/100ML IV SOLN
4.0000 mg | Freq: Once | INTRAVENOUS | Status: AC
  Administered 2015-08-13: 4 mg via INTRAVENOUS
  Filled 2015-08-13: qty 100

## 2015-08-13 MED ORDER — TEMAZEPAM 30 MG PO CAPS: 30.0000 mg | ORAL_CAPSULE | Freq: Every evening | ORAL | Status: DC | PRN

## 2015-08-13 NOTE — Patient Instructions (Signed)

## 2015-08-13 NOTE — Progress Notes (Signed)
Hematology and Oncology Follow Up Visit  Amanda Wiggins [id number] 1945/09/06 70 y.o. 08/13/2015   Principle Diagnosis:  1. Stage I (T1 N0 M0) ductal carcinoma of the left breast. 2. Mechanical aortic valve.  Current Therapy:    Coumadin-lifelong  Zometa 5 mg IV q. year     Interim History:  [name] is is back for followup. She really has had a tough times his last saw her. Back in March, she had 2 heart attacks. She had a blocked LAD. She had 2 stents placed. She now is on Plavix, aspirin along with her Coumadin. She is bruising quite a bit.  Otherwise, she is holding her own. She's had no problems with cough. There's been no shortness of breath. She's had no nausea or vomiting. She's had no change in bowel or bladder habits.  She did have a mammogram in April. This all looked fine. She's had no leg swelling. She's had no issues with her mechanical aortic valve.   Currently, her performance status is ECOG 0.     Medications:  Current outpatient prescriptions:  .  aspirin 81 MG chewable tablet, Chew 81 mg by mouth daily., Disp: , Rfl:  .  BIOTIN PO, Take 1 tablet by mouth every morning. , Disp: , Rfl:  .  Calcium Carbonate-Vitamin D (CALCIUM 600+D) 600-400 MG-UNIT per tablet, Take 1 tablet by mouth 2 (two) times daily.  , Disp: , Rfl:  .  carvedilol (COREG) 6.25 MG tablet, Take 1 tablet (6.25 mg total) by mouth 2 (two) times daily with a meal., Disp: 60 tablet, Rfl: 11 .  clopidogrel (PLAVIX) 75 MG tablet, Take 1 tablet (75 mg total) by mouth daily with breakfast., Disp: 90 tablet, Rfl: 3 .  cycloSPORINE (RESTASIS) 0.05 % ophthalmic emulsion, Place 1 drop into both eyes 2 (two) times daily as needed. , Disp: , Rfl:  .  Desoximetasone 0.05 % GEL, , Disp: , Rfl:  .  Glucosamine-Chondroit-Vit C-Mn (GLUCOSAMINE CHONDR 1500 COMPLX) CAPS, Take 1 capsule by mouth every morning. , Disp: , Rfl:  .  loratadine (CLARITIN) 10 MG tablet, Take 10 mg by mouth daily.  , Disp: , Rfl:  .   Multiple Vitamin (MULTIVITAMIN) capsule, Take 1 capsule by mouth daily.  , Disp: , Rfl:  .  nitroGLYCERIN (NITROSTAT) 0.4 MG SL tablet, Place 1 tablet (0.4 mg total) under the tongue every 5 (five) minutes as needed for chest pain., Disp: 25 tablet, Rfl: 2 .  pantoprazole (PROTONIX) 40 MG tablet, Take 1 tablet (40 mg total) by mouth daily., Disp: 30 tablet, Rfl: 6 .  rosuvastatin (CRESTOR) 20 MG tablet, Take 1 tablet (20 mg total) by mouth daily at 6 PM., Disp: 30 tablet, Rfl: 11 .  senna (SENOKOT) 8.6 MG tablet, Take 2 tablets by mouth daily as needed for constipation. , Disp: , Rfl:  .  temazepam (RESTORIL) 30 MG capsule, TAKE 1 CAPSULE BY MOUTH AT BEDTIME AS NEEDED, Disp: 90 capsule, Rfl: 1 .  warfarin (COUMADIN) 5 MG tablet, Take 1 tablet (5 mg total) by mouth daily at 6 PM. (Patient taking differently: Take 5 mg by mouth daily at 6 PM. Takes 2.5 mg twice a week on Mon Thurs), Disp: , Rfl:   Allergies:  Allergies  Allergen Reactions  . Latex Hives  . Meperidine Hcl Nausea And Vomiting  . Sulfamethoxazole Nausea And Vomiting    REACTION: unspecified    Past Medical History, Surgical history, Social history, and Family History were reviewed  and updated.  Review of Systems: As above  Physical Exam:  height is 5\' 1"  (1.549 m) and weight is 134 lb (60.782 kg). Her oral temperature is 98.2 F (36.8 C). Her blood pressure is 120/48 and her pulse is 66. Her respiration is 16.   Head and neck exam shows no ocular or oral lesions. She has no palpable cervical or supraclavicular lymph nodes. Lungs are clear. Cardiac exam regular rate and rhythm. There are no murmurs, rubs or bruits. She has a systolic click from her mechanical aortic valve. Her breast exam shows right breast no masses edema or erythema. The right axillary adenopathy. Left breast has a well-healed lumpectomy scar at the 12:00 position. There is no left axillary adenopathy. Abdomen is soft. She has good bowel sounds. There is no  palpable liver or spleen. Back exam shows no tenderness over the spine, ribs or hips. Extremities shows no clubbing cyanosis or edema. Skin exam Scattered ecchymoses. She has some petechia on her ankles. Neurological exam is nonfocal.  Lab Results  Component Value Date   WBC 5.8 08/13/2015   HGB 13.4 08/13/2015   HCT 42.0 08/13/2015   MCV 88 08/13/2015   PLT 275 08/13/2015     Chemistry      Component Value Date/Time   NA 141 08/13/2015 1226   NA 142 07/09/2015 0839   NA 142 02/12/2015 1125   K 3.9 08/13/2015 1226   K 4.4 07/09/2015 0839   K 3.8 02/12/2015 1125   CL 104 08/13/2015 1226   CL 106 07/09/2015 0839   CO2 26 08/13/2015 1226   CO2 28 07/09/2015 0839   CO2 24 02/12/2015 1125   BUN 20 08/13/2015 1226   BUN 18 07/09/2015 0839   BUN 14.7 02/12/2015 1125   CREATININE 1.0 08/13/2015 1226   CREATININE 0.97 07/09/2015 0839   CREATININE 1.0 02/12/2015 1125      Component Value Date/Time   CALCIUM 9.7 08/13/2015 1226   CALCIUM 9.4 07/09/2015 0839   CALCIUM 10.2 02/12/2015 1125   ALKPHOS 52 08/13/2015 1226   ALKPHOS 43 07/09/2015 0839   ALKPHOS 56 02/12/2015 1125   AST 39* 08/13/2015 1226   AST 30 07/09/2015 0839   AST 35* 02/12/2015 1125   ALT 40 08/13/2015 1226   ALT 33 07/09/2015 0839   ALT 39 02/12/2015 1125   BILITOT 0.60 08/13/2015 1226   BILITOT 0.4 07/09/2015 0839   BILITOT 0.45 02/12/2015 1125         Impression and Plan: Amanda Wiggins is 37-year  old female with a history of stage I infiltrating duct carcinoma the left breast. She underwent lumpectomy.  She  had been on Femara. She had radiation. It has been 10  years now.  She will get her Zometa today. This is for her osteo-porosis.  I'm glad that her cardiac issues are being taken care of. She really looks good. The cardiologists have obviously done a fantastic job with her.  We will plan to get her back in 6 more months.  I spent about 35 minutes with her today.   Volanda Napoleon,  MD 6/8/20172:04 PM

## 2015-08-14 ENCOUNTER — Encounter (HOSPITAL_COMMUNITY)
Admission: RE | Admit: 2015-08-14 | Discharge: 2015-08-14 | Disposition: A | Discharge status: Home / Self Care | Payer: Medicare Other | Source: Ambulatory Visit | Attending: Cardiovascular Disease | Admitting: Cardiovascular Disease

## 2015-08-14 ENCOUNTER — Telehealth: Payer: Self-pay | Admitting: *Deleted

## 2015-08-14 DIAGNOSIS — I2102 ST elevation (STEMI) myocardial infarction involving left anterior descending coronary artery: Secondary | ICD-10-CM | POA: Diagnosis not present

## 2015-08-14 LAB — VITAMIN D 25 HYDROXY (VIT D DEFICIENCY, FRACTURES): Vitamin D, 25-Hydroxy: 44 ng/mL (ref 30.0–100.0)

## 2015-08-14 NOTE — Telephone Encounter (Addendum)
Patient aware of results  ----- Message from Volanda Napoleon, MD sent at 08/14/2015  7:04 AM EDT ----- Call - vit D level is great!!! pete

## 2015-08-14 NOTE — Progress Notes (Signed)
Daily Session Note  Patient Details  Name: Amanda Wiggins MRN: [id number] Date of Birth: December 08, 1945 Referring Provider:    Encounter Date: 08/14/2015  Check In:     Session Check In - 08/14/15 0951    Check-In   Location [facility]   Staff Present Diane Angelina Pih, MS, EP, [facility], Exercise Physiologist;Gregory Luther Parody, BS, EP, Exercise Physiologist;Saadia Dewitt Oletta Lamas, RN, BSN   Supervising physician immediately available to respond to emergencies See telemetry face sheet for immediately available MD   Medication changes reported     No   Fall or balance concerns reported    No   Warm-up and Cool-down Performed as group-led instruction   Resistance Training Performed Yes   VAD Patient? No   Pain Assessment   Currently in Pain? No/denies      Capillary Blood Glucose: Results for orders placed or performed in visit on 08/13/15 (from the past 24 hour(s))  CBC with Differential [facility])     Status: Abnormal   Collection Time: 08/13/15 12:25 PM  Result Value Ref Range   WBC 5.8 3.9 - 10.0 10e3/uL   RBC 4.78 3.70 - 5.32 10e6/uL   HGB 13.4 11.6 - 15.9 g/dL   HCT 42.0 34.8 - 46.6 %   MCV 88 81 - 101 fL   MCH 28.0 26.0 - 34.0 pg   MCHC 31.9 (L) 32.0 - 36.0 g/dL   RDW 12.9 11.1 - 15.7 %   Platelets 275 145 - 400 10e3/uL   NEUT# 4.1 1.5 - 6.5 10e3/uL   LYMPH# 1.2 0.9 - 3.3 10e3/uL   MONO# 0.4 0.1 - 0.9 10e3/uL   Eosinophils Absolute 0.1 0.0 - 0.5 10e3/uL   BASO# 0.0 0.0 - 0.2 10e3/uL   NEUT% 70.7 39.6 - 80.0 %   LYMPH% 20.7 14.0 - 48.0 %   MONO% 6.2 0.0 - 13.0 %   EOS% 2.1 0.0 - 7.0 %   BASO% 0.3 0.0 - 2.0 %   Narrative   Performed At:  [facility] [place], [place] [address]  PROTIME-INR ([place])     Status: Abnormal   Collection Time: 08/13/15 12:25 PM  Result Value Ref Range   Protime 28.8 (H) 10.6 - 13.4 Seconds   INR 2.4 2.0 - 3.5   Lovenox No    Narrative   Performed At:   [facility] [place]               [place], [place] [address]  [place] ([place] only)     Status: Abnormal   Collection Time: 08/13/15 12:26 PM  Result Value Ref Range   Sodium 141 128 - 145 mEq/L   Potassium 3.9 3.3 - 4.7 mEq/L   Chloride 104 98 - 108 mEq/L   CO2 26 18 - 33 mEq/L   Glucose, Bld 173 (H) 73 - 118 mg/dL   BUN, Bld 20 7 - 22 mg/dL   Creat 1.0 0.6 - 1.2 mg/dl   Total Bilirubin 0.60 0.20 - 1.60 mg/dl   Alkaline Phosphatase 52 26 - 84 U/L   AST 39 (H) 11 - 38 U/L   ALT(SGPT) 40 10 -  47 U/L   Total Protein 6.7 6.4 - 8.1 g/dL   Albumin 3.6 3.3 - 5.5 g/dL   Calcium 9.7 8.0 - 10.3 mg/dL   Narrative   Performed At:  [facility] [place], [place] [address]  VITAMIN D 25 Hydroxy (Vit-D Deficiency, Fractures)     Status: None   Collection Time: 08/13/15 12:26 PM  Result Value Ref Range   Vitamin D, 25-Hydroxy 44.0 30.0 - 100.0 ng/mL   Narrative   Testing performed at: [BN] [facility], [address] [place], [place], [address], Phone: [phone], Laboratory Director: Lindon Romp, MD     Goals Met:  Independence with exercise equipment Exercise tolerated well No report of cardiac concerns or symptoms Strength training completed today  Goals Unmet:  Not Applicable  Comments: check out 1030   Dr. Kate Sable is Medical Director for [place] and Pulmonary Rehab.

## 2015-08-17 ENCOUNTER — Encounter (HOSPITAL_COMMUNITY)
Admission: RE | Admit: 2015-08-17 | Discharge: 2015-08-17 | Disposition: A | Discharge status: Home / Self Care | Payer: Medicare Other | Source: Ambulatory Visit | Attending: Cardiovascular Disease | Admitting: Cardiovascular Disease

## 2015-08-17 DIAGNOSIS — I2102 ST elevation (STEMI) myocardial infarction involving left anterior descending coronary artery: Secondary | ICD-10-CM | POA: Diagnosis not present

## 2015-08-17 NOTE — Progress Notes (Signed)
Daily Session Note  Patient Details  Name: Amanda Wiggins MRN: [id number] Date of Birth: December 28, 1945 Referring Provider:    Encounter Date: 08/17/2015  Check In:     Session Check In - 08/17/15 0930    Check-In   Location [facility]   Staff Present Diane Angelina Pih, MS, EP, [facility], Exercise Physiologist;Rithik Odea Luther Parody, BS, EP, Exercise Physiologist;Christy Oletta Lamas, RN, BSN   Supervising physician immediately available to respond to emergencies See telemetry face sheet for immediately available MD   Medication changes reported     No   Fall or balance concerns reported    No   Warm-up and Cool-down Performed as group-led instruction   Resistance Training Performed Yes   VAD Patient? No   Pain Assessment   Currently in Pain? No/denies   Pain Score 0-No pain   Multiple Pain Sites No      Capillary Blood Glucose: No results found for this or any previous visit (from the past 24 hour(s)).   Goals Met:  Independence with exercise equipment Exercise tolerated well No report of cardiac concerns or symptoms Strength training completed today  Goals Unmet:  Not Applicable  Comments: Check out 1030   Dr. Kate Sable is Medical Director for [place] and Pulmonary Rehab.

## 2015-08-19 ENCOUNTER — Encounter (HOSPITAL_COMMUNITY)
Admission: RE | Admit: 2015-08-19 | Discharge: 2015-08-19 | Disposition: A | Discharge status: Home / Self Care | Payer: Medicare Other | Source: Ambulatory Visit | Attending: Cardiovascular Disease | Admitting: Cardiovascular Disease

## 2015-08-19 DIAGNOSIS — I2102 ST elevation (STEMI) myocardial infarction involving left anterior descending coronary artery: Secondary | ICD-10-CM | POA: Diagnosis not present

## 2015-08-19 NOTE — Progress Notes (Signed)
Daily Session Note  Patient Details  Name: [name] MRN: [id number] Date of Birth: [birth year] Referring Provider:    Encounter Date: 08/19/2015  Check In:     Session Check In - 08/19/15 0930    Check-In   Location [facility]   Staff Present Jerrik Housholder Angelina Pih, MS, EP, [facility], Exercise Physiologist;Gregory Luther Parody, BS, EP, Exercise Physiologist;Christy Oletta Lamas, RN, BSN   Supervising physician immediately available to respond to emergencies See telemetry face sheet for immediately available MD   Medication changes reported     No   Fall or balance concerns reported    No   Warm-up and Cool-down Performed as group-led instruction   Resistance Training Performed Yes   VAD Patient? No   Pain Assessment   Currently in Pain? No/denies   Pain Score 0-No pain   Multiple Pain Sites No      Capillary Blood Glucose: No results found for this or any previous visit (from the past 24 hour(s)).   Goals Met:  Independence with exercise equipment Exercise tolerated well No report of cardiac concerns or symptoms Strength training completed today  Goals Unmet:  Not Applicable  Comments: Checkout: 1030   Dr. Kate Sable is Medical Director for [place] and Pulmonary Rehab.

## 2015-08-21 ENCOUNTER — Encounter (HOSPITAL_COMMUNITY)
Admission: RE | Admit: 2015-08-21 | Discharge: 2015-08-21 | Disposition: A | Discharge status: Home / Self Care | Payer: Medicare Other | Source: Ambulatory Visit | Attending: Cardiovascular Disease | Admitting: Cardiovascular Disease

## 2015-08-21 DIAGNOSIS — I2102 ST elevation (STEMI) myocardial infarction involving left anterior descending coronary artery: Secondary | ICD-10-CM | POA: Diagnosis not present

## 2015-08-21 NOTE — Progress Notes (Signed)
Daily Session Note  Patient Details  Name: Amanda Wiggins MRN: [id number] Date of Birth: Mar 16, 1945 Referring Provider:    Encounter Date: 08/21/2015  Check In:     Session Check In - 08/21/15 0930    Check-In   Location [facility]   Staff Present Diane Angelina Pih, MS, EP, [facility], Exercise Physiologist;Charls Custer Luther Parody, BS, EP, Exercise Physiologist;Christy Oletta Lamas, RN, BSN   Supervising physician immediately available to respond to emergencies See telemetry face sheet for immediately available MD   Medication changes reported     No   Fall or balance concerns reported    No   Warm-up and Cool-down Performed as group-led instruction   Resistance Training Performed Yes   VAD Patient? No   Pain Assessment   Currently in Pain? No/denies   Pain Score 0-No pain   Multiple Pain Sites No      Capillary Blood Glucose: No results found for this or any previous visit (from the past 24 hour(s)).   Goals Met:  Independence with exercise equipment Exercise tolerated well No report of cardiac concerns or symptoms Strength training completed today  Goals Unmet:  Not Applicable  Comments: Check out 1030   Dr. Kate Sable is Medical Director for [place] and Pulmonary Rehab.

## 2015-08-24 ENCOUNTER — Encounter (HOSPITAL_COMMUNITY)
Admission: RE | Admit: 2015-08-24 | Discharge: 2015-08-24 | Disposition: A | Discharge status: Home / Self Care | Payer: Medicare Other | Source: Ambulatory Visit | Attending: Cardiovascular Disease | Admitting: Cardiovascular Disease

## 2015-08-24 DIAGNOSIS — I2102 ST elevation (STEMI) myocardial infarction involving left anterior descending coronary artery: Secondary | ICD-10-CM | POA: Diagnosis not present

## 2015-08-24 NOTE — Progress Notes (Signed)
Daily Session Note  Patient Details  Name: Amanda Wiggins MRN: [id number] Date of Birth: 1945-08-25 Referring Provider:    Encounter Date: 08/24/2015  Check In:     Session Check In - 08/24/15 0930    Check-In   Location [facility]   Staff Present Diane Angelina Pih, MS, EP, [facility], Exercise Physiologist;Sera Hitsman Luther Parody, BS, EP, Exercise Physiologist;Christy Oletta Lamas, RN, BSN   Supervising physician immediately available to respond to emergencies See telemetry face sheet for immediately available MD   Medication changes reported     No   Fall or balance concerns reported    No   Warm-up and Cool-down Performed as group-led instruction   Resistance Training Performed Yes   VAD Patient? No   Pain Assessment   Currently in Pain? No/denies   Pain Score 0-No pain   Multiple Pain Sites No      Capillary Blood Glucose: No results found for this or any previous visit (from the past 24 hour(s)).   Goals Met:  Independence with exercise equipment Exercise tolerated well No report of cardiac concerns or symptoms Strength training completed today  Goals Unmet:  Not Applicable  Comments: Check out 1030   Dr. Kate Sable is Medical Director for [place] and Pulmonary Rehab.

## 2015-08-26 ENCOUNTER — Encounter (HOSPITAL_COMMUNITY)
Admission: RE | Admit: 2015-08-26 | Discharge: 2015-08-26 | Disposition: A | Discharge status: Home / Self Care | Payer: Medicare Other | Source: Ambulatory Visit | Attending: Cardiovascular Disease | Admitting: Cardiovascular Disease

## 2015-08-26 DIAGNOSIS — I2102 ST elevation (STEMI) myocardial infarction involving left anterior descending coronary artery: Secondary | ICD-10-CM | POA: Diagnosis not present

## 2015-08-26 NOTE — Progress Notes (Signed)
Daily Session Note  Patient Details  Name: Amanda Wiggins MRN: [id number] Date of Birth: Jul 21, 1945 Referring Provider:    Encounter Date: 08/26/2015  Check In:     Session Check In - 08/26/15 0930    Check-In   Location [facility]   Staff Present Diane Angelina Pih, MS, EP, [facility], Exercise Physiologist;Zi [place]bury Luther Parody, BS, EP, Exercise Physiologist   Supervising physician immediately available to respond to emergencies See telemetry face sheet for immediately available MD   Medication changes reported     No   Fall or balance concerns reported    No   Warm-up and Cool-down Performed as group-led instruction   Resistance Training Performed Yes   VAD Patient? No   Pain Assessment   Currently in Pain? No/denies   Pain Score 0-No pain   Multiple Pain Sites No      Capillary Blood Glucose: No results found for this or any previous visit (from the past 24 hour(s)).   Goals Met:  Independence with exercise equipment Exercise tolerated well No report of cardiac concerns or symptoms Strength training completed today  Goals Unmet:  Not Applicable  Comments: Check out 1030   Dr. Kate Sable is Medical Director for [place] and Pulmonary Rehab.

## 2015-08-28 ENCOUNTER — Encounter (HOSPITAL_COMMUNITY)
Admission: RE | Admit: 2015-08-28 | Discharge: 2015-08-28 | Disposition: A | Discharge status: Home / Self Care | Payer: Medicare Other | Source: Ambulatory Visit | Attending: Cardiovascular Disease | Admitting: Cardiovascular Disease

## 2015-08-28 DIAGNOSIS — I2102 ST elevation (STEMI) myocardial infarction involving left anterior descending coronary artery: Secondary | ICD-10-CM | POA: Diagnosis not present

## 2015-08-28 NOTE — Progress Notes (Signed)
Daily Session Note  Patient Details  Name: Amanda Wiggins MRN: [id number] Date of Birth: [birth year] Referring Provider:    Encounter Date: 08/28/2015  Check In:     Session Check In - 08/28/15 0930    Check-In   Location [facility]   Staff Present Diane Angelina Pih, MS, EP, [facility], Exercise Physiologist;Emillia Weatherly Luther Parody, BS, EP, Exercise Physiologist   Supervising physician immediately available to respond to emergencies See telemetry face sheet for immediately available MD   Medication changes reported     No   Fall or balance concerns reported    No   Warm-up and Cool-down Performed as group-led instruction   Resistance Training Performed Yes   VAD Patient? No   Pain Assessment   Currently in Pain? No/denies   Pain Score 0-No pain   Multiple Pain Sites No      Capillary Blood Glucose: No results found for this or any previous visit (from the past 24 hour(s)).      Exercise Prescription Changes - 08/27/15 1300    Exercise Review   Progression Yes   Response to Exercise   Blood Pressure (Admit) 98/58 mmHg   Blood Pressure (Exercise) 98/62 mmHg   Blood Pressure (Exit) 98/50 mmHg   Heart Rate (Admit) 62 bpm   Heart Rate (Exercise) 87 bpm   Heart Rate (Exit) 70 bpm   Rating of Perceived Exertion (Exercise) 13   Symptoms No   Duration Progress to 30 minutes of continuous aerobic without signs/symptoms of physical distress   Intensity Rest + 30   Progression   Progression Continue to progress workloads to maintain intensity without signs/symptoms of physical distress.   Resistance Training   Training Prescription Yes   Weight 2.0   Reps 10-12   Treadmill   MPH 3   Grade 0.5   Minutes 15   METs 3.34   NuStep   Level 3   Minutes 15   METs 1.9   Home Exercise Plan   Plans to continue exercise at [facility] (comment)     Goals Met:  Independence with exercise equipment Exercise tolerated well No report of cardiac concerns or symptoms Strength  training completed today  Goals Unmet:  Not Applicable  Comments: Check out 1030   Dr. Kate Sable is Medical Director for [facility] Cardiac and Pulmonary Rehab.

## 2015-08-31 ENCOUNTER — Encounter (HOSPITAL_COMMUNITY)
Admission: RE | Admit: 2015-08-31 | Discharge: 2015-08-31 | Disposition: A | Discharge status: Home / Self Care | Payer: Medicare Other | Source: Ambulatory Visit | Attending: Cardiovascular Disease | Admitting: Cardiovascular Disease

## 2015-08-31 DIAGNOSIS — I2102 ST elevation (STEMI) myocardial infarction involving left anterior descending coronary artery: Secondary | ICD-10-CM | POA: Diagnosis not present

## 2015-08-31 NOTE — Progress Notes (Signed)
Daily Session Note  Patient Details  Name: Amanda Wiggins MRN: [id number] Date of Birth: 20-Dec-1945 Referring Provider:    Encounter Date: 08/31/2015  Check In:     Session Check In - 08/31/15 0930    Check-In   Location [facility]   Staff Present Diane Angelina Pih, MS, EP, [facility], Exercise Physiologist;Izabelle Daus Luther Parody, BS, EP, Exercise Physiologist;Christy Oletta Lamas, RN, BSN   Supervising physician immediately available to respond to emergencies See telemetry face sheet for immediately available MD   Medication changes reported     No   Fall or balance concerns reported    No   Warm-up and Cool-down Performed as group-led instruction   Resistance Training Performed Yes   VAD Patient? No   Pain Assessment   Currently in Pain? No/denies   Pain Score 0-No pain   Multiple Pain Sites No      Capillary Blood Glucose: No results found for this or any previous visit (from the past 24 hour(s)).   Goals Met:  Independence with exercise equipment Exercise tolerated well No report of cardiac concerns or symptoms Strength training completed today  Goals Unmet:  Not Applicable  Comments: Check out 930   Dr. Kate Sable is Medical Director for [place] and Pulmonary Rehab.

## 2015-08-31 NOTE — Progress Notes (Signed)
Cardiac Individual Treatment Plan  Patient Details  Name: Amanda Wiggins MRN: [id number] Date of Birth: 1945-04-19 Referring Provider:    Initial Encounter Date:       CARDIAC REHAB PHASE II ORIENTATION from 06/17/2015 in [place]   Date  06/17/15      Visit Diagnosis: No diagnosis found.  Patient's Home Medications on Admission:  Current outpatient prescriptions:  .  aspirin 81 MG chewable tablet, Chew 81 mg by mouth daily., Disp: , Rfl:  .  BIOTIN PO, Take 1 tablet by mouth every morning. , Disp: , Rfl:  .  Calcium Carbonate-Vitamin D (CALCIUM 600+D) 600-400 MG-UNIT per tablet, Take 1 tablet by mouth 2 (two) times daily.  , Disp: , Rfl:  .  carvedilol (COREG) 6.25 MG tablet, Take 1 tablet (6.25 mg total) by mouth 2 (two) times daily with a meal., Disp: 60 tablet, Rfl: 11 .  clopidogrel (PLAVIX) 75 MG tablet, Take 1 tablet (75 mg total) by mouth daily with breakfast., Disp: 90 tablet, Rfl: 3 .  cycloSPORINE (RESTASIS) 0.05 % ophthalmic emulsion, Place 1 drop into both eyes 2 (two) times daily as needed. , Disp: , Rfl:  .  Desoximetasone 0.05 % GEL, , Disp: , Rfl:  .  Glucosamine-Chondroit-Vit C-Mn (GLUCOSAMINE CHONDR 1500 COMPLX) CAPS, Take 1 capsule by mouth every morning. , Disp: , Rfl:  .  loratadine (CLARITIN) 10 MG tablet, Take 10 mg by mouth daily.  , Disp: , Rfl:  .  Multiple Vitamin (MULTIVITAMIN) capsule, Take 1 capsule by mouth daily.  , Disp: , Rfl:  .  nitroGLYCERIN (NITROSTAT) 0.4 MG SL tablet, Place 1 tablet (0.4 mg total) under the tongue every 5 (five) minutes as needed for chest pain., Disp: 25 tablet, Rfl: 2 .  pantoprazole (PROTONIX) 40 MG tablet, Take 1 tablet (40 mg total) by mouth daily., Disp: 30 tablet, Rfl: 6 .  rosuvastatin (CRESTOR) 20 MG tablet, Take 1 tablet (20 mg total) by mouth daily at 6 PM., Disp: 30 tablet, Rfl: 11 .  senna (SENOKOT) 8.6 MG tablet, Take 2 tablets by mouth daily as needed for constipation. , Disp: , Rfl:  .   temazepam (RESTORIL) 30 MG capsule, Take 1 capsule (30 mg total) by mouth at bedtime as needed., Disp: 90 capsule, Rfl: 1 .  warfarin (COUMADIN) 5 MG tablet, Take 1 tablet (5 mg total) by mouth daily at 6 PM. (Patient taking differently: Take 5 mg by mouth daily at 6 PM. Takes 2.5 mg twice a week on Mon Thurs), Disp: , Rfl:   Past Medical History: Past Medical History  Diagnosis Date  . Hyperlipidemia   . Hypertension   . Hx of breast cancer   . GERD (gastroesophageal reflux disease)   . Osteopenia   . Status post aortic valve repair   . FHx: migraine headaches   . Menopausal syndrome   . Aortic valve replaced   . Hiatal hernia   . Hx Breast Cancer, IDC, Stage I 07/03/2003  . 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 Never Smoker   Smokeless tobacco  . Never Used    Labs:     Recent Review Flowsheet Data    Labs for ITP Cardiac and Pulmonary Rehab Latest Ref Rng 12/30/2013 06/23/2014 05/16/2015 05/16/2015 07/09/2015   Cholestrol 0 - 200 mg/dL 177 172 - - 153   LDLCALC 0 - 99 mg/dL 88 79 - - 73  HDL >39.00 mg/dL 54.60 55.80 - - 48.40   Trlycerides 0.0 - 149.0 mg/dL 170.0(H) 184.0(H) - - 158.0(H)   TCO2 0 - 100 mmol/L - - 21 22 -      Capillary Blood Glucose: No results found for: GLUCAP   Exercise Target Goals:    Exercise Program Goal: Individual exercise prescription set with THRR, safety & activity barriers. Participant demonstrates ability to understand and report RPE using BORG scale, to self-measure pulse accurately, and to acknowledge the importance of the exercise prescription.  Exercise Prescription Goal: Starting with aerobic activity 30 plus minutes a day, 3 days per week for initial exercise prescription. Provide home exercise prescription and guidelines that participant acknowledges understanding prior to discharge.  Activity Barriers & Risk Stratification:     Activity Barriers & Cardiac Risk  Stratification - 06/17/15 1137    Activity Barriers & Cardiac Risk Stratification   Activity Barriers None   Cardiac Risk Stratification High      6 Minute Walk:     6 Minute Walk      06/17/15 1440       6 Minute Walk   Phase Initial     Distance 650 feet     Walk Time 6 minutes     # of Rest Breaks 0     MPH 1.23     METS 1.94     RPE 11     Perceived Dyspnea  13     VO2 Peak 5.37     Symptoms No     Resting HR 70 bpm     Resting BP 92/68 mmHg     Max Ex. HR 79 bpm     Max Ex. BP 120/84 mmHg     2 Minute Post BP 98/60 mmHg        Initial Exercise Prescription:     Initial Exercise Prescription - 06/17/15 1400    Date of Initial Exercise RX and Referring Provider   Date 06/17/15   Treadmill   MPH 1.5   Grade 0   Minutes 15   METs 2.15   NuStep   Level 2   Minutes 15   METs 1.5   Prescription Details   Frequency (times per week) 3   Duration Progress to 30 minutes of continuous aerobic without signs/symptoms of physical distress   Intensity   THRR REST +  30   THRR 40-80% of Max Heartrate 103-136   Ratings of Perceived Exertion 11-13   Perceived Dyspnea 0-4   Progression   Progression Continue to progress workloads to maintain intensity without signs/symptoms of physical distress.   Resistance Training   Training Prescription Yes   Weight 1   Reps 10-12      Perform Capillary Blood Glucose checks as needed.  Exercise Prescription Changes:      Exercise Prescription Changes      07/03/15 1900 08/02/15 1100 08/12/15 1400 08/19/15 1300 08/27/15 1300   Exercise Review   Progression Yes Yes Yes Yes Yes   Response to Exercise   Blood Pressure (Admit) 92/50 mmHg 90/58 mmHg 108/60 mmHg 108/60 mmHg 98/58 mmHg   Blood Pressure (Exercise) 92/60 mmHg 108/60 mmHg 100/60 mmHg 100/60 mmHg 98/62 mmHg   Blood Pressure (Exit) 88/60 mmHg 110/60 mmHg 90/62 mmHg 90/62 mmHg 98/50 mmHg   Heart Rate (Admit) 78 bpm 61 bpm 82 bpm 82 bpm 62 bpm   Heart Rate  (Exercise) 93 bpm 81 bpm 85 bpm 85 bpm 87 bpm  Heart Rate (Exit) 84 bpm 70 bpm 80 bpm 80 bpm 70 bpm   Rating of Perceived Exertion (Exercise)  13 13 13 13    Symptoms No No No No No   Duration Progress to 30 minutes of continuous aerobic without signs/symptoms of physical distress Progress to 30 minutes of continuous aerobic without signs/symptoms of physical distress Progress to 30 minutes of continuous aerobic without signs/symptoms of physical distress Progress to 30 minutes of continuous aerobic without signs/symptoms of physical distress Progress to 30 minutes of continuous aerobic without signs/symptoms of physical distress   Intensity  Rest + 30 Rest + 30 Rest + 30 Rest + 30   Progression   Progression Continue to progress workloads to maintain intensity without signs/symptoms of physical distress. Continue to progress workloads to maintain intensity without signs/symptoms of physical distress. Continue to progress workloads to maintain intensity without signs/symptoms of physical distress. Continue to progress workloads to maintain intensity without signs/symptoms of physical distress. Continue to progress workloads to maintain intensity without signs/symptoms of physical distress.   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          Exercise Comments:      Exercise Comments      07/03/15 1930 08/02/15 1156         Exercise Comments Patient is progressing  appropriately. Patient is progressing appropriately.          Discharge Exercise Prescription (Final Exercise Prescription Changes):     Exercise Prescription Changes - 08/27/15 1300    Exercise Review   Progression Yes   Response to Exercise   Blood Pressure (Admit) 98/58 mmHg   Blood Pressure (Exercise) 98/62 mmHg   Blood Pressure (Exit) 98/50 mmHg   Heart Rate (Admit) 62 bpm   Heart Rate (Exercise) 87 bpm   Heart Rate (Exit) 70 bpm   Rating of Perceived Exertion (Exercise) 13   Symptoms No   Duration Progress to 30 minutes of continuous aerobic without signs/symptoms of physical distress   Intensity Rest + 30   Progression   Progression Continue to progress workloads to maintain intensity without signs/symptoms of physical distress.   Resistance Training   Training Prescription Yes   Weight 2.0   Reps 10-12   Treadmill   MPH 3   Grade 0.5   Minutes 15   METs 3.34   NuStep   Level 3   Minutes 15   METs 1.9   Home Exercise Plan   Plans to continue exercise at [facility] (comment)      Nutrition:  Target Goals: Understanding of nutrition guidelines, daily intake of sodium 1500mg , cholesterol 200mg , calories 30% from fat and 7% or less from saturated fats, daily to have 5 or more servings of fruits and vegetables.  Biometrics:     Pre Biometrics -  06/17/15 1446    Pre Biometrics   Waist Circumference 33.5 inches   Hip Circumference 38 inches   Waist to Hip Ratio 0.88 %   Triceps Skinfold 11 mm   % Body Fat 32.8 %   Grip Strength 47 kg   Flexibility 0 in  unable to test due to flex box unavailable   Single Leg Stand 18 seconds       Nutrition Therapy Plan and Nutrition Goals:     Nutrition Therapy & Goals - 06/17/15 1138    Nutrition Therapy   Diet Heart Healthy diet with low sodium.    Intervention Plan   Intervention Nutrition handout(s) given to patient.   Expected Outcomes Short Term Goal: Understand basic principles of dietary  content, such as calories, fat, sodium, cholesterol and nutrients.      Nutrition Discharge: Rate Your Plate Scores:     Nutrition Assessments - 06/17/15 1144    MEDFICTS Scores   Pre Score 12      Nutrition Goals Re-Evaluation:     Nutrition Goals Re-Evaluation      07/03/15 0844           Intervention Plan   Intervention Continue to educate, counsel and set short/long term goals regarding individualized specific personal dietary modifications.          Psychosocial: Target Goals: Acknowledge presence or absence of depression, maximize coping skills, provide positive support system. Participant is able to verbalize types and ability to use techniques and skills needed for reducing stress and depression.  Initial Review & Psychosocial Screening:     Initial Psych Review & Screening - 07/03/15 0848    Initial Review   Current issues with Current Stress Concerns      Quality of Life Scores:     Quality of Life - 06/17/15 1448    Quality of Life Scores   Health/Function Pre 24.8 %   Socioeconomic Pre 27.36 %   Psych/Spiritual Pre 30 %   Family Pre 26.4 %   GLOBAL Pre 26.63 %      PHQ-9:     Recent Review Flowsheet Data    Depression screen [facility] 2/9 06/17/2015 07/07/2014 06/20/2013   Decreased Interest 0 (No Data)  0   Down, Depressed, Hopeless 1 (No Data)  0   PHQ - 2 Score 1 - 0   Altered sleeping 0 - -   Tired, decreased energy 3 - -   Change in appetite 0 - -   Feeling bad or failure about yourself  0 - -   Trouble concentrating 0 - -   Moving slowly or fidgety/restless 0 - -   Suicidal thoughts 0 - -   PHQ-9 Score 4 - -   Difficult doing work/chores Somewhat difficult - -      Psychosocial Evaluation and Intervention:     Psychosocial Evaluation - 06/17/15 1145    Psychosocial Evaluation & Interventions   Continued Psychosocial Services Needed No      Psychosocial Re-Evaluation:     Psychosocial Re-Evaluation      07/03/15 0848 07/27/15  1505 08/31/15 1137       Psychosocial Re-Evaluation   Interventions Encouraged to attend Cardiac Rehabilitation for the exercise Encouraged to attend Cardiac Rehabilitation for the exercise Relaxation education;Encouraged to attend Cardiac Rehabilitation for the exercise;Stress management education     Continued Psychosocial Services Needed No No No        Vocational Rehabilitation: Provide vocational rehab assistance to qualifying candidates.  Vocational Rehab Evaluation & Intervention:     Vocational Rehab - 06/17/15 1138    Initial Vocational Rehab Evaluation & Intervention   Assessment shows need for Vocational Rehabilitation No      Education: Education Goals: Education classes will be provided on a weekly basis, covering required topics. Participant will state understanding/return demonstration of topics presented.  Learning Barriers/Preferences:     Learning Barriers/Preferences - 06/17/15 1137    Learning Barriers/Preferences   Learning Barriers None   Learning Preferences Video;Pictoral;Individual Instruction      Education Topics: Hypertension, Hypertension Reduction -Define heart disease and high blood pressure. Discus how high blood pressure affects the body and ways to reduce high blood pressure.      CARDIAC REHAB PHASE II EXERCISE from 08/26/2015 in [place]   Date  07/08/15   Educator  Russella Dar   Instruction Review Code  2- meets goals/outcomes      Exercise and Your Heart -Discuss why it is important to exercise, the FITT principles of exercise, normal and abnormal responses to exercise, and how to exercise safely.      CARDIAC REHAB PHASE II EXERCISE from 08/26/2015 in [place]   Date  07/15/15   Educator  Russella Dar   Instruction Review Code  2- meets goals/outcomes      Angina -Discuss definition of angina, causes of angina, treatment of angina, and how to decrease risk of having angina.       CARDIAC REHAB PHASE II EXERCISE from 08/26/2015 in [place]   Date  07/22/15   Educator  D Coad   Instruction Review Code  2- meets goals/outcomes      Cardiac Medications -Review what the following cardiac medications are used for, how they affect the body, and side effects that may occur when taking the medications.  Medications include Aspirin, Beta blockers, calcium channel blockers, ACE Inhibitors, angiotensin receptor blockers, diuretics, digoxin, and antihyperlipidemics.      CARDIAC REHAB PHASE II EXERCISE from 08/26/2015 in [place]   Date  07/29/15   Educator  D. Coad   Instruction Review Code  2- meets goals/outcomes      Congestive Heart Failure -Discuss the definition of CHF, how to live with CHF, the signs and symptoms of CHF, and how keep track of weight and sodium intake.      CARDIAC REHAB PHASE II EXERCISE from 08/26/2015 in [place]   Date  08/05/15   Educator  DC   Instruction Review Code  2- meets goals/outcomes      Heart Disease and Intimacy -Discus the effect sexual activity has on the heart, how changes occur during intimacy as we age, and safety during sexual activity.      CARDIAC REHAB PHASE II EXERCISE from 08/26/2015 in [place]   Date  08/12/15   Educator  DC   Instruction Review Code  2- meets goals/outcomes      Smoking Cessation / COPD -Discuss different methods to quit smoking, the health benefits of quitting smoking, and the definition of COPD.      CARDIAC REHAB PHASE II EXERCISE from 08/26/2015 in [place]   Date  08/19/15   Educator  Russella Dar   Instruction Review Code  2- meets goals/outcomes      Nutrition I: Fats -Discuss the types of cholesterol, what cholesterol does to the heart, and how cholesterol levels can be controlled.  CARDIAC REHAB PHASE II EXERCISE from 08/26/2015 in [place]   Date  08/26/15   Educator  Russella Dar   Instruction Review Code  2- meets goals/outcomes      Nutrition II: Labels -Discuss the different components of food labels and how to read food label   Heart Parts and Heart Disease -Discuss the anatomy of the heart, the pathway of blood circulation through the heart, and these are affected by heart disease.   Stress I: Signs and Symptoms -Discuss the causes of stress, how stress may lead to anxiety and depression, and ways to limit stress.   Stress II: Relaxation -Discuss different types of relaxation techniques to limit stress.      CARDIAC REHAB PHASE II EXERCISE from 08/26/2015 in [place]   Date  06/24/15   Educator  D Coad   Instruction Review Code  2- meets goals/outcomes      Warning Signs of Stroke / TIA -Discuss definition of a stroke, what the signs and symptoms are of a stroke, and how to identify when someone is having stroke.          CARDIAC REHAB PHASE II EXERCISE from 08/26/2015 in [place]   Date  07/01/15   Educator  Russella Dar   Instruction Review Code  2- meets goals/outcomes      Knowledge Questionnaire Score:     Knowledge Questionnaire Score - 06/17/15 1138    Knowledge Questionnaire Score   Pre Score 24/24      Core Components/Risk Factors/Patient Goals at Admission:     Personal Goals and Risk Factors at Admission - 06/17/15 1139    Core Components/Risk Factors/Patient Goals on Admission   Increase Strength and Stamina Yes   Intervention Provide advice, education, support and counseling about physical activity/exercise needs.;Develop an individualized exercise prescription for aerobic and resistive training based on initial evaluation findings, risk stratification, comorbidities and participant's personal goals.   Expected Outcomes Achievement of increased cardiorespiratory fitness and enhanced flexibility, muscular endurance and  strength shown through measurements of functional capacity and personal statement of participant.   Tobacco Cessation --  Never smoked   Diabetes --  N/A   Lipids Yes   Intervention Provide education and support for participant on nutrition & aerobic/resistive exercise along with prescribed medications to achieve LDL 70mg , HDL >40mg .   Expected Outcomes Short Term: Participant states understanding of desired cholesterol values and is compliant with medications prescribed. Participant is following exercise prescription and nutrition guidelines.;Long Term: Cholesterol controlled with medications as prescribed, with individualized exercise RX and with personalized nutrition plan. Value goals: LDL < 70mg , HDL > 40 mg.   Personal Goal Other Yes   Personal Goal Increase energy, do things w/o SOB or tightness   Intervention Get back to normal activities   Expected Outcomes Feel better overall      Core Components/Risk Factors/Patient Goals Review:      Goals and Risk Factor Review      07/03/15 0845 07/27/15 1505 08/31/15 1137       Core Components/Risk Factors/Patient Goals Review   Personal Goals Review Increase Strength and Stamina;Lipids Increase Strength and Stamina;Lipids Increase Strength and Stamina;Lipids     Review Will continue to progress patient and educate Will continue to progress patient and educate. patient states she feels better since starting program Will continue to progress patient and educate.     Expected Outcomes Have more energy Have more energy Have more energy  Core Components/Risk Factors/Patient Goals at Discharge (Final Review):      Goals and Risk Factor Review - 08/31/15 1137    Core Components/Risk Factors/Patient Goals Review   Personal Goals Review Increase Strength and Stamina;Lipids   Review Will continue to progress patient and educate.   Expected Outcomes Have more energy      ITP Comments:     ITP Comments      08/06/15 0900            ITP Comments Patient meet with the Dietician in our group heart healthy guidelines class at 9am. Group was shown how to create a heart healthy plate, how to make healthy choices while dining out, how to choose healthy fats , proteins, carbohydrates and the correct amount of sodium to use.           Comments:

## 2015-09-01 ENCOUNTER — Other Ambulatory Visit (INDEPENDENT_AMBULATORY_CARE_PROVIDER_SITE_OTHER): Payer: Medicare Other

## 2015-09-01 ENCOUNTER — Ambulatory Visit (INDEPENDENT_AMBULATORY_CARE_PROVIDER_SITE_OTHER): Payer: Medicare Other | Admitting: Cardiovascular Disease

## 2015-09-01 ENCOUNTER — Ambulatory Visit (INDEPENDENT_AMBULATORY_CARE_PROVIDER_SITE_OTHER): Payer: Medicare Other

## 2015-09-01 ENCOUNTER — Encounter: Payer: Self-pay | Admitting: Cardiovascular Disease

## 2015-09-01 VITALS — BP 110/90 | HR 60 | Ht 61.0 in | Wt 134.2 lb

## 2015-09-01 DIAGNOSIS — Z952 Presence of prosthetic heart valve: Secondary | ICD-10-CM

## 2015-09-01 DIAGNOSIS — I359 Nonrheumatic aortic valve disorder, unspecified: Secondary | ICD-10-CM

## 2015-09-01 DIAGNOSIS — I2510 Atherosclerotic heart disease of native coronary artery without angina pectoris: Secondary | ICD-10-CM

## 2015-09-01 DIAGNOSIS — Z7901 Long term (current) use of anticoagulants: Secondary | ICD-10-CM

## 2015-09-01 DIAGNOSIS — Z954 Presence of other heart-valve replacement: Secondary | ICD-10-CM

## 2015-09-01 DIAGNOSIS — Z9861 Coronary angioplasty status: Secondary | ICD-10-CM

## 2015-09-01 DIAGNOSIS — Z5181 Encounter for therapeutic drug level monitoring: Secondary | ICD-10-CM

## 2015-09-01 LAB — COMPREHENSIVE METABOLIC PANEL
ALBUMIN: 4 g/dL (ref 3.6–5.1)
ALK PHOS: 49 U/L (ref 33–130)
ALT: 33 U/L — AB (ref 6–29)
AST: 36 U/L — AB (ref 10–35)
BILIRUBIN TOTAL: 0.3 mg/dL (ref 0.2–1.2)
BUN: 19 mg/dL (ref 7–25)
CALCIUM: 8.7 mg/dL (ref 8.6–10.4)
CO2: 26 mmol/L (ref 20–31)
CREATININE: 1.06 mg/dL — AB (ref 0.50–0.99)
Chloride: 105 mmol/L (ref 98–110)
GLUCOSE: 99 mg/dL (ref 65–99)
Potassium: 4 mmol/L (ref 3.5–5.3)
SODIUM: 140 mmol/L (ref 135–146)
Total Protein: 6.5 g/dL (ref 6.1–8.1)

## 2015-09-01 LAB — LIPID PANEL
CHOLESTEROL: 145 mg/dL (ref 125–200)
HDL: 49 mg/dL (ref >=46)
LDL Cholesterol: 66 mg/dL (ref <130)
Total CHOL/HDL Ratio: 3 Ratio (ref <=5.0)
Triglycerides: 152 mg/dL — ABNORMAL HIGH (ref <150)
VLDL: 30 mg/dL (ref <30)

## 2015-09-01 LAB — POCT INR: INR: 2.6

## 2015-09-01 NOTE — Progress Notes (Signed)
Amanda Wiggins Date of Birth  11-01-1945 [facility] [id number]    [place], [place]  [address] [phone]  Fax  [phone]  Problem List 1. Aortic valve replacement 2. Hyperlipidemia 3. Breast cancer 4. CAD -  1. Mid LAD to Dist LAD lesion, 100% stenosed. Post intervention with a single Synergy DES 2.25 mm x 20 mm S/p mini vision stent to distal LAD   History of Present Illness:  Amanda Wiggins is a 70 year old female with a history of aortic stenosis-status post aortic valve replacement.  She also has a history of hypercholesterolemia.  She's not had any episodes of chest pain or shortness of breath.  She's had her INR levels checked in our Coumadin clinic. Her INR levels have all been therapeutic.  Nov. 6 , 2014  Amanda Wiggins is doing well.  i saw her a year ago.  She is 10 years our from her breast cancer and is doing well.    No cardiac complaints.   Able to do all of her normal activities.     Nov. 13, 2015:  Amanda Wiggins is doing well.   No CP .  No dyspnea.   Has been stressful.  Her mother died this past year.   Her INR levels have been  Well controlled   Nov. 14, 2016:  Doing well.  No cardiac issues.  INR levels have been stable,  A bit low the last time .  Had an AVR 13 years ago .    June 05, 2015:  Amanda Wiggins was recently seen at the hospital for an acute anterior wall ST segment elevation myocardial infarction. She had stenting of her mid LAD. She had recurrent pain on the day of her original discharge and was taken back to the Cath Lab and had a second stent placed. She continued to have some intermittent episodes of chest discomfort. She has had persistent ST segment elevation. Her left ventricular systolic function is moderately depressed with an EF of 40-45%.   She has akinesis of the distal anterior wall and apex.   She is not having the band like chest pain that she had on presentation Has had some GERD.   Asked about going back on Omeprazole  - we discussed  her need for Protonix with the plavix .   September 01, 2015:  Amanda Wiggins is seen back for follow up visit. Still having trouble getting over her MI .  Still fatigued but is slowly getting better.   No CP.       Current Outpatient Prescriptions on File Prior to Visit  Medication Sig Dispense Refill  . aspirin 81 MG chewable tablet Chew 81 mg by mouth daily.    Marland Kitchen BIOTIN PO Take 1 tablet by mouth every morning.     . Calcium Carbonate-Vitamin D (CALCIUM 600+D) 600-400 MG-UNIT per tablet Take 1 tablet by mouth 2 (two) times daily.      . carvedilol (COREG) 6.25 MG tablet Take 1 tablet (6.25 mg total) by mouth 2 (two) times daily with a meal. 60 tablet 11  . clopidogrel (PLAVIX) 75 MG tablet Take 1 tablet (75 mg total) by mouth daily with breakfast. 90 tablet 3  . cycloSPORINE (RESTASIS) 0.05 % ophthalmic emulsion Place 1 drop into both eyes 2 (two) times daily as needed.     . Desoximetasone 0.05 % GEL Apply 1 application topically as needed (AS NEEDED TO AFFECTED AREA).     . Glucosamine-Chondroit-Vit C-Mn (GLUCOSAMINE CHONDR 1500 COMPLX) CAPS Take  1 capsule by mouth every morning.     . loratadine (CLARITIN) 10 MG tablet Take 10 mg by mouth daily.      . Multiple Vitamin (MULTIVITAMIN) capsule Take 1 capsule by mouth daily.      . nitroGLYCERIN (NITROSTAT) 0.4 MG SL tablet Place 1 tablet (0.4 mg total) under the tongue every 5 (five) minutes as needed for chest pain. 25 tablet 2  . pantoprazole (PROTONIX) 40 MG tablet Take 1 tablet (40 mg total) by mouth daily. 30 tablet 6  . rosuvastatin (CRESTOR) 20 MG tablet Take 1 tablet (20 mg total) by mouth daily at 6 PM. 30 tablet 11  . senna (SENOKOT) 8.6 MG tablet Take 2 tablets by mouth daily as needed for constipation.     . temazepam (RESTORIL) 30 MG capsule Take 1 capsule (30 mg total) by mouth at bedtime as needed. 90 capsule 1  . warfarin (COUMADIN) 5 MG tablet Take 1 tablet (5 mg total) by mouth daily at 6 PM. (Patient taking differently: Take 5 mg by  mouth daily at 6 PM. Takes 2.5 mg twice a week on Mon Thurs)     No current facility-administered medications on file prior to visit.    Allergies  Allergen Reactions  . Latex Hives  . Meperidine Hcl Nausea And Vomiting  . Sulfamethoxazole Nausea And Vomiting    REACTION: unspecified    Past Medical History  Diagnosis Date  . Hyperlipidemia   . Hypertension   . Hx of breast cancer   . GERD (gastroesophageal reflux disease)   . Osteopenia   . Status post aortic valve repair   . FHx: migraine headaches   . Menopausal syndrome   . Aortic valve replaced   . Hiatal hernia   . Hx Breast Cancer, IDC, Stage I 07/03/2003  . 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 08/22/2011    Past Surgical History  Procedure Laterality Date  . Lummpectomy breast cancer  07/03/2003  . Aortic valve replacement  2003  . Hysterectomy, endometiral cancer  2001  . Right shouler surgery    . Nissan fundoplicaiton  [id number]    post-op hematoma on heparin   . Hiatal hernia repair    . Cardiac catheterization      Ejection Fraction   . Cardiac catheterization N/A 05/16/2015    Procedure: Left Heart Cath and Coronary Angiography;  Surgeon: Leonie Man, MD;  Location: [facility];  Service: Cardiovascular;  Laterality: N/A;  . Cardiac catheterization  05/16/2015    Procedure: Coronary Stent Intervention;  Surgeon: Leonie Man, MD;  Location: [facility];  Service: Cardiovascular;;  . Cardiac catheterization N/A 05/19/2015    Procedure: Left Heart Cath and Coronary Angiography;  Surgeon: Troy Sine, MD;  Location: [facility];  Service: Cardiovascular;  Laterality: N/A;  . Cardiac catheterization N/A 05/19/2015    Procedure: Coronary Stent Intervention;  Surgeon: Troy Sine, MD;  Location: [facility];  Service: Cardiovascular;  Laterality: N/A;    History  Smoking status  . Never Smoker   Smokeless tobacco  . Never Used    History  Alcohol Use  . 0.0 oz/week  .  0 Standard drinks or equivalent per week    Comment: socially    Family History  Problem Relation Age of Onset  . Heart attack Mother 75  . Osteoarthritis Brother     hpercholesterolemia    Reviw of Systems:  Reviewed in the HPI.  All other systems are negative.  Physical Exam: BP 110/90 mmHg  Pulse 60  Ht 5\' 1"  (1.549 m)  Wt 134 lb 3.2 oz (60.873 kg)  BMI 25.37 kg/m2 The patient is alert and oriented x 3.  The mood and affect are normal.   Skin: warm and dry.  Color is normal.    HEENT:   the sclera are nonicteric.  The mucous membranes are moist.  The carotids are 2+ without bruits.  There is no thyromegaly.  There is no JVD.    Lungs: clear.  The chest wall is non tender.    Heart: regular rate with a normal S1 and mechanical S2.  There are no murmurs, gallops, or rubs. The PMI is not displaced.     Abdomen: good bowel sounds.  There is no guarding or rebound.  There is no hepatosplenomegaly or tenderness.  There are no masses.   Extremities:  no clubbing, cyanosis, or edema.  The legs are without rashes.  The distal pulses are intact.   Neuro:  Cranial nerves II - XII are intact.  Motor and sensory functions are intact.    The gait is normal.  ECG:    Assessment / Plan:   1. Aortic valve replacement - continue current meds.   To coumadin clinic today   2. Hyperlipidemia - lipids look great   3. Breast cancer - stable   4. CAD - is s/p stenting of her LAD x2.  Seems to be doing well at this point .  Seems to be still sluggish but is slowly regaining her strength.  Will DC the ASA.  Continue plavix and coumadin .   She wants to go ahead and start with cardiac rehab.     5. Chronic systolic CHF:   EF is [id number].   BP is still too low to add ARB   Her BP at home in the mornings is typically in the 85-90 range.  Will re-evaluate in 6 months .   Mertie Moores, MD  09/01/2015 9:05 AM    [address],  [place], [place]  [address] Pager [phone] Phone: [phone]; Fax: [phone]

## 2015-09-01 NOTE — Patient Instructions (Signed)
Medication Instructions:  STOP Aspirin   Labwork: Your physician recommends that you return for lab work in: 6 months on the day of or a few days before your office visit with Dr. Acie Fredrickson.  You will need to FAST for this appointment - nothing to eat or drink after midnight the night before except water.   Testing/Procedures: None Ordered   Follow-Up: Your physician wants you to follow-up in: 6 months with Dr. Acie Fredrickson.  You will receive a reminder letter in the mail two months in advance. If you don't receive a letter, please call our office to schedule the follow-up appointment.   If you need a refill on your cardiac medications before your next appointment, please call your pharmacy.   Thank you for choosing [facility]! Christen Bame, RN [phone]

## 2015-09-02 ENCOUNTER — Encounter (HOSPITAL_COMMUNITY)
Admission: RE | Admit: 2015-09-02 | Discharge: 2015-09-02 | Disposition: A | Discharge status: Home / Self Care | Payer: Medicare Other | Source: Ambulatory Visit | Attending: Cardiovascular Disease | Admitting: Cardiovascular Disease

## 2015-09-02 DIAGNOSIS — I2102 ST elevation (STEMI) myocardial infarction involving left anterior descending coronary artery: Secondary | ICD-10-CM | POA: Diagnosis not present

## 2015-09-02 NOTE — Progress Notes (Signed)
Daily Session Note  Patient Details  Name: Amanda Wiggins MRN: [id number] Date of Birth: 24-Apr-1945 Referring Provider:    Encounter Date: 09/02/2015  Check In:     Session Check In - 09/02/15 0930    Check-In   Location [facility]   Staff Present Diane Angelina Pih, MS, EP, [facility], Exercise Physiologist;Elecia Serafin Luther Parody, BS, EP, Exercise Physiologist   Supervising physician immediately available to respond to emergencies See telemetry face sheet for immediately available MD   Medication changes reported     No   Fall or balance concerns reported    No   Warm-up and Cool-down Performed as group-led instruction   Resistance Training Performed Yes   VAD Patient? No   Pain Assessment   Currently in Pain? No/denies   Pain Score 0-No pain   Multiple Pain Sites No      Capillary Blood Glucose: No results found for this or any previous visit (from the past 24 hour(s)).   Goals Met:  Independence with exercise equipment Exercise tolerated well No report of cardiac concerns or symptoms Strength training completed today  Goals Unmet:  Not Applicable  Comments: Check out 1030   Dr. Kate Sable is Medical Director for [place] and Pulmonary Rehab.

## 2015-09-04 ENCOUNTER — Encounter (HOSPITAL_COMMUNITY)
Admission: RE | Admit: 2015-09-04 | Discharge: 2015-09-04 | Disposition: A | Discharge status: Home / Self Care | Payer: Medicare Other | Source: Ambulatory Visit | Attending: Cardiovascular Disease | Admitting: Cardiovascular Disease

## 2015-09-04 DIAGNOSIS — I2102 ST elevation (STEMI) myocardial infarction involving left anterior descending coronary artery: Secondary | ICD-10-CM | POA: Diagnosis not present

## 2015-09-04 NOTE — Progress Notes (Signed)
Daily Session Note  Patient Details  Name: Amanda Wiggins MRN: [id number] Date of Birth: 06-10-1945 Referring Provider:    Encounter Date: 09/04/2015  Check In:     Session Check In - 09/04/15 0930    Check-In   Location [facility]   Staff Present Diane Angelina Pih, MS, EP, [facility], Exercise Physiologist;Blu Lori Luther Parody, BS, EP, Exercise Physiologist;Debra Wynetta Emery, RN, BSN   Supervising physician immediately available to respond to emergencies See telemetry face sheet for immediately available MD   Medication changes reported     No   Fall or balance concerns reported    No   Warm-up and Cool-down Performed as group-led instruction   Resistance Training Performed Yes   VAD Patient? No   Pain Assessment   Currently in Pain? No/denies   Pain Score 0-No pain   Multiple Pain Sites No      Capillary Blood Glucose: No results found for this or any previous visit (from the past 24 hour(s)).   Goals Met:  Independence with exercise equipment Exercise tolerated well No report of cardiac concerns or symptoms Strength training completed today  Goals Unmet:  Not Applicable  Comments: Check out 1030   Dr. Kate Sable is Medical Director for [place] and Pulmonary Rehab.

## 2015-09-07 ENCOUNTER — Encounter (HOSPITAL_COMMUNITY)
Admission: RE | Admit: 2015-09-07 | Discharge: 2015-09-07 | Disposition: A | Discharge status: Home / Self Care | Payer: Medicare Other | Source: Ambulatory Visit | Attending: Cardiovascular Disease | Admitting: Cardiovascular Disease

## 2015-09-07 DIAGNOSIS — I10 Essential (primary) hypertension: Secondary | ICD-10-CM | POA: Diagnosis not present

## 2015-09-07 DIAGNOSIS — E785 Hyperlipidemia, unspecified: Secondary | ICD-10-CM | POA: Insufficient documentation

## 2015-09-07 DIAGNOSIS — Z7982 Long term (current) use of aspirin: Secondary | ICD-10-CM | POA: Diagnosis not present

## 2015-09-07 DIAGNOSIS — I2102 ST elevation (STEMI) myocardial infarction involving left anterior descending coronary artery: Secondary | ICD-10-CM | POA: Insufficient documentation

## 2015-09-07 DIAGNOSIS — K219 Gastro-esophageal reflux disease without esophagitis: Secondary | ICD-10-CM | POA: Diagnosis not present

## 2015-09-07 DIAGNOSIS — Z955 Presence of coronary angioplasty implant and graft: Secondary | ICD-10-CM | POA: Diagnosis not present

## 2015-09-07 DIAGNOSIS — Z79899 Other long term (current) drug therapy: Secondary | ICD-10-CM | POA: Diagnosis not present

## 2015-09-07 DIAGNOSIS — Z853 Personal history of malignant neoplasm of breast: Secondary | ICD-10-CM | POA: Insufficient documentation

## 2015-09-07 DIAGNOSIS — M810 Age-related osteoporosis without current pathological fracture: Secondary | ICD-10-CM | POA: Diagnosis not present

## 2015-09-07 DIAGNOSIS — Z7902 Long term (current) use of antithrombotics/antiplatelets: Secondary | ICD-10-CM | POA: Insufficient documentation

## 2015-09-07 NOTE — Progress Notes (Signed)
Daily Session Note  Patient Details  Name: PANG ROBERS MRN: [id number] Date of Birth: 05-06-1945 Referring Provider:    Encounter Date: 09/07/2015  Check In:     Session Check In - 09/07/15 0930    Check-In   Location [facility]   Staff Present Diane Angelina Pih, MS, EP, [facility], Exercise Physiologist;Gregory Luther Parody, BS, EP, Exercise Physiologist;Yakov Bergen Wynetta Emery, RN, BSN   Supervising physician immediately available to respond to emergencies See telemetry face sheet for immediately available MD   Medication changes reported     No   Fall or balance concerns reported    No   Warm-up and Cool-down Performed as group-led instruction   Resistance Training Performed Yes   VAD Patient? No   Pain Assessment   Currently in Pain? No/denies   Pain Score 0-No pain   Multiple Pain Sites No      Capillary Blood Glucose: No results found for this or any previous visit (from the past 24 hour(s)).   Goals Met:  Independence with exercise equipment Exercise tolerated well No report of cardiac concerns or symptoms Strength training completed today  Goals Unmet:  Not Applicable  Comments: Check out 10:30.   Dr. Kate Sable is Medical Director for [facility] Cardiac and Pulmonary Rehab.

## 2015-09-09 ENCOUNTER — Encounter (HOSPITAL_COMMUNITY)
Admission: RE | Admit: 2015-09-09 | Discharge: 2015-09-09 | Disposition: A | Discharge status: Home / Self Care | Payer: Medicare Other | Source: Ambulatory Visit | Attending: Cardiovascular Disease | Admitting: Cardiovascular Disease

## 2015-09-09 DIAGNOSIS — I2102 ST elevation (STEMI) myocardial infarction involving left anterior descending coronary artery: Secondary | ICD-10-CM | POA: Diagnosis not present

## 2015-09-09 NOTE — Progress Notes (Signed)
Daily Session Note  Patient Details  Name: GAURI GALVAO MRN: [id number] Date of Birth: 08-29-1945 Referring Provider:    Encounter Date: 09/09/2015  Check In:     Session Check In - 09/09/15 0930    Check-In   Location [facility]   Staff Present Diane Angelina Pih, MS, EP, [facility], Exercise Physiologist;Gregory Luther Parody, BS, EP, Exercise Physiologist;Sydney Azure Wynetta Emery, RN, BSN   Supervising physician immediately available to respond to emergencies See telemetry face sheet for immediately available MD   Medication changes reported     No   Fall or balance concerns reported    No   Warm-up and Cool-down Performed as group-led instruction   Resistance Training Performed Yes   VAD Patient? No   Pain Assessment   Currently in Pain? No/denies   Pain Score 0-No pain   Multiple Pain Sites No      Capillary Blood Glucose: No results found for this or any previous visit (from the past 24 hour(s)).   Goals Met:  Independence with exercise equipment Exercise tolerated well No report of cardiac concerns or symptoms Strength training completed today  Goals Unmet:  Not Applicable  Comments: Check out 10:30   Dr. Kate Sable is Medical Director for [place] and Pulmonary Rehab.

## 2015-09-11 ENCOUNTER — Encounter (HOSPITAL_COMMUNITY)
Admission: RE | Admit: 2015-09-11 | Discharge: 2015-09-11 | Disposition: A | Discharge status: Home / Self Care | Payer: Medicare Other | Source: Ambulatory Visit | Attending: Cardiovascular Disease | Admitting: Cardiovascular Disease

## 2015-09-11 DIAGNOSIS — I2102 ST elevation (STEMI) myocardial infarction involving left anterior descending coronary artery: Secondary | ICD-10-CM | POA: Diagnosis not present

## 2015-09-11 DIAGNOSIS — Z955 Presence of coronary angioplasty implant and graft: Secondary | ICD-10-CM

## 2015-09-11 NOTE — Progress Notes (Signed)
Daily Session Note  Patient Details  Name: Amanda Wiggins MRN: [id number] Date of Birth: Dec 20, 1945 Referring Provider:    Encounter Date: 09/11/2015  Check In:     Session Check In - 09/11/15 0930    Check-In   Location [facility]   Staff Present Diane Angelina Pih, MS, EP, [facility], Exercise Physiologist;Kooper Godshall Luther Parody, BS, EP, Exercise Physiologist;Debra Wynetta Emery, RN, BSN   Supervising physician immediately available to respond to emergencies See telemetry face sheet for immediately available MD   Medication changes reported     No   Fall or balance concerns reported    No   Warm-up and Cool-down Performed as group-led instruction   Resistance Training Performed Yes   VAD Patient? No   Pain Assessment   Currently in Pain? No/denies   Pain Score 0-No pain   Multiple Pain Sites No      Capillary Blood Glucose: No results found for this or any previous visit (from the past 24 hour(s)).   Goals Met:  Independence with exercise equipment Exercise tolerated well No report of cardiac concerns or symptoms Strength training completed today  Goals Unmet:  Not Applicable  Comments: Check out 1030   Dr. Kate Sable is Medical Director for [place] and Pulmonary Rehab.

## 2015-09-14 ENCOUNTER — Encounter (HOSPITAL_COMMUNITY): Payer: Medicare Other

## 2015-09-16 ENCOUNTER — Encounter (HOSPITAL_COMMUNITY)
Admission: RE | Admit: 2015-09-16 | Discharge: 2015-09-16 | Disposition: A | Discharge status: Home / Self Care | Payer: Medicare Other | Source: Ambulatory Visit | Attending: Cardiovascular Disease | Admitting: Cardiovascular Disease

## 2015-09-16 VITALS — Ht 61.0 in | Wt 133.2 lb

## 2015-09-16 DIAGNOSIS — I2102 ST elevation (STEMI) myocardial infarction involving left anterior descending coronary artery: Secondary | ICD-10-CM

## 2015-09-16 DIAGNOSIS — Z955 Presence of coronary angioplasty implant and graft: Secondary | ICD-10-CM

## 2015-09-16 NOTE — Progress Notes (Signed)
Daily Session Note  Patient Details  Name: Amanda Wiggins MRN: [id number] Date of Birth: 03-Sep-1945 Referring Provider:    Encounter Date: 09/16/2015  Check In:     Session Check In - 09/16/15 0930    Check-In   Location [facility]   Staff Present Suzanne Boron, BS, EP, Exercise Physiologist;Mialynn Shelvin Wynetta Emery, RN, BSN   Supervising physician immediately available to respond to emergencies See telemetry face sheet for immediately available MD   Medication changes reported     No   Fall or balance concerns reported    No   Warm-up and Cool-down Performed as group-led instruction   Resistance Training Performed Yes   VAD Patient? No   Pain Assessment   Currently in Pain? No/denies   Pain Score 0-No pain   Multiple Pain Sites No      Capillary Blood Glucose: No results found for this or any previous visit (from the past 24 hour(s)).   Goals Met:  Independence with exercise equipment Exercise tolerated well No report of cardiac concerns or symptoms Strength training completed today  Goals Unmet:  Not Applicable  Comments: Check out 1030   Dr. Kate Sable is Medical Director for [place] and Pulmonary Rehab.

## 2015-09-18 ENCOUNTER — Encounter (HOSPITAL_COMMUNITY): Payer: Medicare Other

## 2015-09-25 NOTE — Progress Notes (Signed)
Discharge Summary  Patient Details  Name: Amanda Wiggins MRN: [id number] Date of Birth: 03-12-1945 Referring Provider:     Number of Visits: 36  Reason for Discharge:  Patient reached a stable level of exercise. Patient independent in their exercise.  Smoking History:  History  Smoking status  . Never Smoker   Smokeless tobacco  . Never Used    Diagnosis:  ST elevation (STEMI) myocardial infarction involving left anterior descending coronary artery (HCC)  Stented coronary artery  ADL UCSD:   Initial Exercise Prescription:     Initial Exercise Prescription - 06/17/15 1400    Date of Initial Exercise RX and Referring Provider   Date 06/17/15   Treadmill   MPH 1.5   Grade 0   Minutes 15   METs 2.15   NuStep   Level 2   Minutes 15   METs 1.5   Prescription Details   Frequency (times per week) 3   Duration Progress to 30 minutes of continuous aerobic without signs/symptoms of physical distress   Intensity   THRR REST +  30   THRR 40-80% of Max Heartrate 103-136   Ratings of Perceived Exertion 11-13   Perceived Dyspnea 0-4   Progression   Progression Continue to progress workloads to maintain intensity without signs/symptoms of physical distress.   Resistance Training   Training Prescription Yes   Weight 1   Reps 10-12      Discharge Exercise Prescription (Final Exercise Prescription Changes):     Exercise Prescription Changes - 09/16/15 1200    Exercise Review   Progression No   Response to Exercise   Blood Pressure (Admit) 90/50 mmHg   Blood Pressure (Exercise) 120/80 mmHg   Blood Pressure (Exit) 98/60 mmHg   Heart Rate (Admit) 64 bpm   Heart Rate (Exercise) 87 bpm   Heart Rate (Exit) 73 bpm   Rating of Perceived Exertion (Exercise) 11   Symptoms No   Duration Progress to 30 minutes of continuous aerobic without signs/symptoms of physical distress   Intensity Rest + 30   Progression   Progression Continue to progress workloads to maintain  intensity without signs/symptoms of physical distress.   Resistance Training   Training Prescription Yes   Weight 3   Reps 10-12   Treadmill   MPH 3.2   Grade 0.5   Minutes 20   METs 3.6   NuStep   Level 3   Minutes 15   METs 3.36   Home Exercise Plan   Plans to continue exercise at [facility] (comment)   Frequency Add 2 additional days to program exercise sessions.      Functional Capacity:     6 Minute Walk      06/17/15 1440 09/16/15 1233     6 Minute Walk   Phase Initial Discharge    Distance 650 feet 1450 feet    Distance % Change  123.08 %    Walk Time 6 minutes 6 minutes    # of Rest Breaks 0 0    MPH 1.23 2.74    METS 1.94 3.1    RPE 11 10    Perceived Dyspnea  13 7    VO2 Peak 5.37 10.91    Symptoms No No    Resting HR 70 bpm 65 bpm    Resting BP 92/68 mmHg 98/52 mmHg    Max Ex. HR 79 bpm 93 bpm    Max Ex. BP 120/84 mmHg 120/60 mmHg    2  Minute Post BP 98/60 mmHg 112/60 mmHg       Psychological, QOL, Others - Outcomes: PHQ 2/9: Depression screen [facility] 2/9 09/25/2015 06/17/2015 07/07/2014 06/20/2013  Decreased Interest 0 0 (No Data) 0  Down, Depressed, Hopeless 0 1 (No Data) 0  PHQ - 2 Score 0 1 - 0  Altered sleeping 0 0 - -  Tired, decreased energy 1 3 - -  Change in appetite 0 0 - -  Feeling bad or failure about yourself  0 0 - -  Trouble concentrating 0 0 - -  Moving slowly or fidgety/restless 0 0 - -  Suicidal thoughts 0 0 - -  PHQ-9 Score 1 4 - -  Difficult doing work/chores Not difficult at all Somewhat difficult - -    Quality of Life:     Quality of Life - 09/16/15 1237    Quality of Life Scores   Health/Function Pre 24.8 %   Health/Function Post 28.4 %   Health/Function % Change 14.52 %   Socioeconomic Pre 27.36 %   Socioeconomic Post 30 %   Socioeconomic % Change  9.65 %   Psych/Spiritual Pre 30 %   Psych/Spiritual Post 30 %   Psych/Spiritual % Change 0 %   Family Pre 26.4 %   Family Post 30 %   Family % Change 13.64 %    GLOBAL Pre 26.63 %   GLOBAL Post 29.27 %   GLOBAL % Change 9.91 %      Personal Goals: Goals established at orientation with interventions provided to work toward goal.     Personal Goals and Risk Factors at Admission - 06/17/15 1139    Core Components/Risk Factors/Patient Goals on Admission   Increase Strength and Stamina Yes   Intervention Provide advice, education, support and counseling about physical activity/exercise needs.;Develop an individualized exercise prescription for aerobic and resistive training based on initial evaluation findings, risk stratification, comorbidities and participant's personal goals.   Expected Outcomes Achievement of increased cardiorespiratory fitness and enhanced flexibility, muscular endurance and strength shown through measurements of functional capacity and personal statement of participant.   Tobacco Cessation --  Never smoked   Diabetes --  N/A   Lipids Yes   Intervention Provide education and support for participant on nutrition & aerobic/resistive exercise along with prescribed medications to achieve LDL 70mg , HDL >40mg .   Expected Outcomes Short Term: Participant states understanding of desired cholesterol values and is compliant with medications prescribed. Participant is following exercise prescription and nutrition guidelines.;Long Term: Cholesterol controlled with medications as prescribed, with individualized exercise RX and with personalized nutrition plan. Value goals: LDL < 70mg , HDL > 40 mg.   Personal Goal Other Yes   Personal Goal Increase energy, do things w/o SOB or tightness   Intervention Get back to normal activities   Expected Outcomes Feel better overall       Personal Goals Discharge:     Goals and Risk Factor Review      07/03/15 0845 07/27/15 1505 08/31/15 1137 09/25/15 1318     Core Components/Risk Factors/Patient Goals Review   Personal Goals Review Increase Strength and Stamina;Lipids Increase Strength and  Stamina;Lipids Increase Strength and Stamina;Lipids     Review Will continue to progress patient and educate Will continue to progress patient and educate. patient states she feels better since starting program Will continue to progress patient and educate. Patient did achiever her goals upon graduation. Was able to do activities w/o SOB or tigness. She did say she felt  better than when she started.     Expected Outcomes Have more energy Have more energy Have more energy To accomplish her goals.        Nutrition & Weight - Outcomes:     Pre Biometrics - 06/17/15 1446    Pre Biometrics   Waist Circumference 33.5 inches   Hip Circumference 38 inches   Waist to Hip Ratio 0.88 %   Triceps Skinfold 11 mm   % Body Fat 32.8 %   Grip Strength 47 kg   Flexibility 0 in  unable to test due to flex box unavailable   Single Leg Stand 18 seconds         Post Biometrics - 09/16/15 1236     Post  Biometrics   Height 5\' 1"  (1.549 m)   Weight 133 lb 2.5 oz (60.4 kg)   Waist Circumference 30 inches   Hip Circumference 38.5 inches   Waist to Hip Ratio 0.78 %   BMI (Calculated) 25.2   Triceps Skinfold 16 mm   % Body Fat 33.4 %   Grip Strength 49 kg   Flexibility 0 in   Single Leg Stand 15 seconds      Nutrition:     Nutrition Therapy & Goals - 06/17/15 1138    Nutrition Therapy   Diet Heart Healthy diet with low sodium.    Intervention Plan   Intervention Nutrition handout(s) given to patient.   Expected Outcomes Short Term Goal: Understand basic principles of dietary content, such as calories, fat, sodium, cholesterol and nutrients.      Nutrition Discharge:     Nutrition Assessments - 09/25/15 1311    MEDFICTS Scores   Pre Score 12   Post Score 34   Score Difference 22      Education Questionnaire Score:     Knowledge Questionnaire Score - 09/25/15 1317    Knowledge Questionnaire Score   Pre Score 24/24   Post Score 24/24      Goals reviewed with patient; copy  given to patient.

## 2015-10-14 ENCOUNTER — Ambulatory Visit (INDEPENDENT_AMBULATORY_CARE_PROVIDER_SITE_OTHER): Payer: Medicare Other | Admitting: Pharmacist

## 2015-10-14 DIAGNOSIS — I359 Nonrheumatic aortic valve disorder, unspecified: Secondary | ICD-10-CM | POA: Diagnosis not present

## 2015-10-14 DIAGNOSIS — Z7901 Long term (current) use of anticoagulants: Secondary | ICD-10-CM

## 2015-10-14 DIAGNOSIS — Z952 Presence of prosthetic heart valve: Secondary | ICD-10-CM

## 2015-10-14 DIAGNOSIS — Z954 Presence of other heart-valve replacement: Secondary | ICD-10-CM

## 2015-10-14 DIAGNOSIS — Z5181 Encounter for therapeutic drug level monitoring: Secondary | ICD-10-CM | POA: Diagnosis not present

## 2015-10-14 LAB — POCT INR: INR: 2.8

## 2015-10-23 ENCOUNTER — Other Ambulatory Visit: Payer: Self-pay | Admitting: Cardiovascular Disease

## 2015-11-25 ENCOUNTER — Ambulatory Visit (INDEPENDENT_AMBULATORY_CARE_PROVIDER_SITE_OTHER): Payer: Medicare Other | Admitting: *Deleted

## 2015-11-25 DIAGNOSIS — Z7901 Long term (current) use of anticoagulants: Secondary | ICD-10-CM

## 2015-11-25 DIAGNOSIS — Z5181 Encounter for therapeutic drug level monitoring: Secondary | ICD-10-CM | POA: Diagnosis not present

## 2015-11-25 DIAGNOSIS — Z952 Presence of prosthetic heart valve: Secondary | ICD-10-CM

## 2015-11-25 DIAGNOSIS — Z954 Presence of other heart-valve replacement: Secondary | ICD-10-CM

## 2015-11-25 DIAGNOSIS — I359 Nonrheumatic aortic valve disorder, unspecified: Secondary | ICD-10-CM | POA: Diagnosis not present

## 2015-11-25 LAB — POCT INR: INR: 2.5

## 2015-12-03 ENCOUNTER — Other Ambulatory Visit: Payer: Self-pay | Admitting: Physician Assistant

## 2016-01-06 ENCOUNTER — Ambulatory Visit (INDEPENDENT_AMBULATORY_CARE_PROVIDER_SITE_OTHER): Payer: Medicare Other | Admitting: *Deleted

## 2016-01-06 DIAGNOSIS — Z5181 Encounter for therapeutic drug level monitoring: Secondary | ICD-10-CM

## 2016-01-06 DIAGNOSIS — I359 Nonrheumatic aortic valve disorder, unspecified: Secondary | ICD-10-CM | POA: Diagnosis not present

## 2016-01-06 DIAGNOSIS — Z952 Presence of prosthetic heart valve: Secondary | ICD-10-CM | POA: Diagnosis not present

## 2016-01-06 DIAGNOSIS — Z7901 Long term (current) use of anticoagulants: Secondary | ICD-10-CM

## 2016-01-06 LAB — POCT INR: INR: 2.5

## 2016-01-11 ENCOUNTER — Other Ambulatory Visit: Payer: Self-pay | Admitting: Internal Medicine

## 2016-01-11 DIAGNOSIS — C549 Malignant neoplasm of corpus uteri, unspecified: Secondary | ICD-10-CM

## 2016-01-11 DIAGNOSIS — Z7901 Long term (current) use of anticoagulants: Secondary | ICD-10-CM

## 2016-01-11 DIAGNOSIS — G4701 Insomnia due to medical condition: Secondary | ICD-10-CM

## 2016-01-11 DIAGNOSIS — Z853 Personal history of malignant neoplasm of breast: Secondary | ICD-10-CM

## 2016-01-12 ENCOUNTER — Telehealth: Payer: Self-pay | Admitting: Cardiovascular Disease

## 2016-01-12 MED ORDER — CARVEDILOL 3.125 MG PO TABS: 3.1250 mg | ORAL_TABLET | Freq: Two times a day (BID) | ORAL | 11 refills | Status: DC

## 2016-01-12 NOTE — Telephone Encounter (Signed)
Have her hydrate more. Decrease coreg to 3. 125 bid. Keep checking BP  Call if it stay low.  May need to hold completely

## 2016-01-12 NOTE — Telephone Encounter (Signed)
New message      Pt c/o BP issue: STAT if pt c/o blurred vision, one-sided weakness or slurred speech  1. What are your last 5 BP readings?  85/48 this am, 104/58 last week, 103/60 at gyn office 2. Are you having any other symptoms (ex. Dizziness, headache, blurred vision, passed out)?  Lightheadedness. Floating on air  3. What is your BP issue?  bp is too low?

## 2016-01-12 NOTE — Telephone Encounter (Signed)
Spoke with patient who states she has been experiencing light-headedness and "floating" feeling recently.  States BP has been lower than normal, even later in the day and with activity.  She reports BP today 85/48 after vacuuming.  Last week she felt "floaty" and fell and skinned her knee in the driveway.  She takes Carvedilol 6.25 mg BID and wonders if she should stop this or lower the dose.  I advised that I will send to Dr. Acie Fredrickson for advice on which he would prefer her to do.  She is s/p PCI 05/16/15.  She verbalized understanding and agreement with plan and thanked me for the call.

## 2016-01-12 NOTE — Telephone Encounter (Signed)
Reviewed Dr. Elmarie Shiley advice with patient who verbalized understanding and agreement.  BP this afternoon is 115/80 mmHg, pulse 73 bpm. She will keep a log and bring with her to appointment on 12/12 or call back with problems.  She thanked me for the call.

## 2016-02-15 ENCOUNTER — Other Ambulatory Visit (HOSPITAL_BASED_OUTPATIENT_CLINIC_OR_DEPARTMENT_OTHER): Payer: Medicare Other

## 2016-02-15 ENCOUNTER — Ambulatory Visit (HOSPITAL_BASED_OUTPATIENT_CLINIC_OR_DEPARTMENT_OTHER): Payer: Medicare Other | Admitting: Hematology & Oncology

## 2016-02-15 DIAGNOSIS — Z853 Personal history of malignant neoplasm of breast: Secondary | ICD-10-CM

## 2016-02-15 DIAGNOSIS — Z952 Presence of prosthetic heart valve: Secondary | ICD-10-CM

## 2016-02-15 DIAGNOSIS — C549 Malignant neoplasm of corpus uteri, unspecified: Secondary | ICD-10-CM

## 2016-02-15 DIAGNOSIS — Z7901 Long term (current) use of anticoagulants: Secondary | ICD-10-CM | POA: Diagnosis not present

## 2016-02-15 DIAGNOSIS — G4701 Insomnia due to medical condition: Secondary | ICD-10-CM

## 2016-02-15 LAB — CMP (CANCER CENTER ONLY)
ALT(SGPT): 38 U/L (ref 10–47)
AST: 42 U/L — ABNORMAL HIGH (ref 11–38)
Albumin: 3.8 g/dL (ref 3.3–5.5)
Alkaline Phosphatase: 49 U/L (ref 26–84)
BUN: 11 mg/dL (ref 7–22)
CHLORIDE: 104 meq/L (ref 98–108)
CO2: 29 meq/L (ref 18–33)
CREATININE: 1.1 mg/dL (ref 0.6–1.2)
Calcium: 9.2 mg/dL (ref 8.0–10.3)
GLUCOSE: 103 mg/dL (ref 73–118)
Potassium: 4.4 mEq/L (ref 3.3–4.7)
SODIUM: 145 meq/L (ref 128–145)
TOTAL PROTEIN: 6.5 g/dL (ref 6.4–8.1)
Total Bilirubin: 0.5 mg/dl (ref 0.20–1.60)

## 2016-02-15 LAB — CBC WITH DIFFERENTIAL (CANCER CENTER ONLY)
BASO#: 0 10*3/uL (ref 0.0–0.2)
BASO%: 0.2 % (ref 0.0–2.0)
EOS ABS: 0.1 10*3/uL (ref 0.0–0.5)
EOS%: 1.8 % (ref 0.0–7.0)
HCT: 40.8 % (ref 34.8–46.6)
HGB: 13.3 g/dL (ref 11.6–15.9)
LYMPH#: 1.4 10*3/uL (ref 0.9–3.3)
LYMPH%: 25.6 % (ref 14.0–48.0)
MCH: 28.9 pg (ref 26.0–34.0)
MCHC: 32.6 g/dL (ref 32.0–36.0)
MCV: 89 fL (ref 81–101)
MONO#: 0.5 10*3/uL (ref 0.1–0.9)
MONO%: 8.1 % (ref 0.0–13.0)
NEUT#: 3.6 10*3/uL (ref 1.5–6.5)
NEUT%: 64.3 % (ref 39.6–80.0)
PLATELETS: 274 10*3/uL (ref 145–400)
RBC: 4.6 10*6/uL (ref 3.70–5.32)
RDW: 13.2 % (ref 11.1–15.7)
WBC: 5.5 10*3/uL (ref 3.9–10.0)

## 2016-02-15 MED ORDER — TEMAZEPAM 30 MG PO CAPS: 30.0000 mg | ORAL_CAPSULE | Freq: Every evening | ORAL | 1 refills | Status: DC | PRN

## 2016-02-15 MED ORDER — ALPRAZOLAM 0.25 MG PO TABS: 0.2500 mg | ORAL_TABLET | Freq: Two times a day (BID) | ORAL | 1 refills | Status: DC | PRN

## 2016-02-15 NOTE — Progress Notes (Signed)
Hematology and Oncology Follow Up Visit  Amanda Wiggins [id number] 07-29-1945 70 y.o. 02/15/2016   Principle Diagnosis:  1. Stage I (T1 N0 M0) ductal carcinoma of the left breast. 2. Mechanical aortic valve.  Current Therapy:    Coumadin-lifelong  Zometa 5 mg IV q. Year - given in June 2018     Interim History:  [name] is is back for followup. She is doing okay. She's had no problems since we last saw her. There's been no change in her medications.  She does here cardiologist soon. She usually has echocardiogram done when he sees her.  There's not been any problems with bleeding or bruising. She is on Coumadin.  She's had no cough or shortness of breath. She's had no change in bowel or bladder habits.  Her mammogram was done back in April. Everything was okay on the mammogram. Overall, her performance status is ECOG 1.   Medications:  Current Outpatient Prescriptions:  .  BIOTIN PO, Take 1 tablet by mouth every morning. , Disp: , Rfl:  .  Calcium Carbonate-Vitamin D (CALCIUM 600+D) 600-400 MG-UNIT per tablet, Take 1 tablet by mouth 2 (two) times daily.  , Disp: , Rfl:  .  carvedilol (COREG) 3.125 MG tablet, Take 1 tablet (3.125 mg total) by mouth 2 (two) times daily with a meal., Disp: 60 tablet, Rfl: 11 .  clopidogrel (PLAVIX) 75 MG tablet, Take 1 tablet (75 mg total) by mouth daily with breakfast., Disp: 90 tablet, Rfl: 3 .  cycloSPORINE (RESTASIS) 0.05 % ophthalmic emulsion, Place 1 drop into both eyes 2 (two) times daily as needed. , Disp: , Rfl:  .  Desoximetasone 0.05 % GEL, Apply 1 application topically as needed (AS NEEDED TO AFFECTED AREA). , Disp: , Rfl:  .  Glucosamine-Chondroit-Vit C-Mn (GLUCOSAMINE CHONDR 1500 COMPLX) CAPS, Take 1 capsule by mouth every morning. , Disp: , Rfl:  .  loratadine (CLARITIN) 10 MG tablet, Take 10 mg by mouth daily.  , Disp: , Rfl:  .  Multiple Vitamin (MULTIVITAMIN) capsule, Take 1 capsule by mouth daily.  , Disp: , Rfl:  .   nitroGLYCERIN (NITROSTAT) 0.4 MG SL tablet, Place 1 tablet (0.4 mg total) under the tongue every 5 (five) minutes as needed for chest pain., Disp: 25 tablet, Rfl: 2 .  pantoprazole (PROTONIX) 40 MG tablet, TAKE 1 TABLET (40 MG TOTAL) BY MOUTH DAILY., Disp: 30 tablet, Rfl: 8 .  rosuvastatin (CRESTOR) 20 MG tablet, Take 1 tablet (20 mg total) by mouth daily at 6 PM., Disp: 30 tablet, Rfl: 11 .  senna (SENOKOT) 8.6 MG tablet, Take 2 tablets by mouth daily as needed for constipation. , Disp: , Rfl:  .  temazepam (RESTORIL) 30 MG capsule, TAKE 1 CAPSULE BY MOUTH AT BEDTIME AS NEEDED, Disp: 90 capsule, Rfl: 1 .  warfarin (COUMADIN) 5 MG tablet, TAKE AS DIRECTED BY COUMADIN CLINIC, Disp: 90 tablet, Rfl: 1  Allergies:  Allergies  Allergen Reactions  . Latex Hives  . Meperidine Hcl Nausea And Vomiting  . Sulfamethoxazole Nausea And Vomiting    REACTION: unspecified    Past Medical History, Surgical history, Social history, and Family History were reviewed and updated.  Review of Systems: As above  Physical Exam:  oral temperature is 98.5 F (36.9 C). Her blood pressure is 104/60 and her pulse is 56 (abnormal).   Head and neck exam shows no ocular or oral lesions. She has no palpable cervical or supraclavicular lymph nodes. Lungs are clear. Cardiac  exam regular rate and rhythm. There are no murmurs, rubs or bruits. She has a systolic click from her mechanical aortic valve. Her breast exam shows right breast no masses edema or erythema. The right axillary adenopathy. Left breast has a well-healed lumpectomy scar at the 12:00 position. There is no left axillary adenopathy. Abdomen is soft. She has good bowel sounds. There is no palpable liver or spleen. Back exam shows no tenderness over the spine, ribs or hips. Extremities shows no clubbing cyanosis or edema. Skin exam Scattered ecchymoses. She has some petechia on her ankles. Neurological exam is nonfocal.  Lab Results  Component Value Date   WBC  5.5 02/15/2016   HGB 13.3 02/15/2016   HCT 40.8 02/15/2016   MCV 89 02/15/2016   PLT 274 02/15/2016     Chemistry      Component Value Date/Time   NA 145 02/15/2016 1124   NA 142 02/12/2015 1125   K 4.4 02/15/2016 1124   K 3.8 02/12/2015 1125   CL 104 02/15/2016 1124   CO2 29 02/15/2016 1124   CO2 24 02/12/2015 1125   BUN 11 02/15/2016 1124   BUN 14.7 02/12/2015 1125   CREATININE 1.1 02/15/2016 1124   CREATININE 1.0 02/12/2015 1125      Component Value Date/Time   CALCIUM 9.2 02/15/2016 1124   CALCIUM 10.2 02/12/2015 1125   ALKPHOS 49 02/15/2016 1124   ALKPHOS 56 02/12/2015 1125   AST 42 (H) 02/15/2016 1124   AST 35 (H) 02/12/2015 1125   ALT 38 02/15/2016 1124   ALT 39 02/12/2015 1125   BILITOT 0.50 02/15/2016 1124   BILITOT 0.45 02/12/2015 1125         Impression and Plan: Amanda Wiggins is 70-year  old female with a history of stage I infiltrating duct carcinoma the left breast. She underwent lumpectomy.  She  had been on Femara. She had radiation. It has been 10  years now.  I'm glad that her cardiac issues are being taken care of. She really looks good. The cardiologists have obviously done a fantastic job with her.  We will plan to get her back in 6 more months. We see her back, we will do Zometa.  Marland KitchenVolanda Napoleon, MD 12/11/201712:22 PM

## 2016-02-16 ENCOUNTER — Ambulatory Visit (INDEPENDENT_AMBULATORY_CARE_PROVIDER_SITE_OTHER): Payer: Medicare Other | Admitting: Cardiovascular Disease

## 2016-02-16 ENCOUNTER — Encounter: Payer: Self-pay | Admitting: Cardiology

## 2016-02-16 ENCOUNTER — Ambulatory Visit (INDEPENDENT_AMBULATORY_CARE_PROVIDER_SITE_OTHER): Payer: Medicare Other | Admitting: *Deleted

## 2016-02-16 ENCOUNTER — Encounter: Payer: Self-pay | Admitting: Cardiovascular Disease

## 2016-02-16 VITALS — BP 100/80 | HR 64 | Ht 61.0 in | Wt 136.8 lb

## 2016-02-16 DIAGNOSIS — Z7901 Long term (current) use of anticoagulants: Secondary | ICD-10-CM

## 2016-02-16 DIAGNOSIS — I10 Essential (primary) hypertension: Secondary | ICD-10-CM | POA: Diagnosis not present

## 2016-02-16 DIAGNOSIS — Z5181 Encounter for therapeutic drug level monitoring: Secondary | ICD-10-CM

## 2016-02-16 DIAGNOSIS — I359 Nonrheumatic aortic valve disorder, unspecified: Secondary | ICD-10-CM

## 2016-02-16 DIAGNOSIS — I2510 Atherosclerotic heart disease of native coronary artery without angina pectoris: Secondary | ICD-10-CM | POA: Diagnosis not present

## 2016-02-16 DIAGNOSIS — Z952 Presence of prosthetic heart valve: Secondary | ICD-10-CM | POA: Diagnosis not present

## 2016-02-16 LAB — LIPID PANEL
CHOL/HDL RATIO: 3 ratio (ref <5.0)
Cholesterol: 141 mg/dL (ref <200)
HDL: 47 mg/dL — ABNORMAL LOW (ref >50)
LDL CALC: 70 mg/dL (ref <100)
Triglycerides: 122 mg/dL (ref <150)
VLDL: 24 mg/dL (ref <30)

## 2016-02-16 LAB — POCT INR: INR: 2.9

## 2016-02-16 NOTE — Progress Notes (Signed)
Amanda Wiggins Date of Birth  01/09/1946 [facility] [id number] [address]    [place], [place]  [address] [phone]  Fax  [phone]  Problem List 1. Aortic valve replacement 2. Hyperlipidemia 3. Breast cancer 4. CAD -  1. Mid LAD to Dist LAD lesion, 100% stenosed. Post intervention with a single Synergy DES 2.25 mm x 20 mm S/p mini vision stent to distal LAD   History of Present Illness:  Amanda Wiggins is a 70 year old female with a history of aortic stenosis-status post aortic valve replacement.  She also has a history of hypercholesterolemia.  She's not had any episodes of chest pain or shortness of breath.  She's had her INR levels checked in our Coumadin clinic. Her INR levels have all been therapeutic.  Nov. 6 , 2014  Amanda Wiggins is doing well.  i saw her a year ago.  She is 10 years our from her breast cancer and is doing well.    No cardiac complaints.   Able to do all of her normal activities.     Nov. 13, 2015:  Amanda Wiggins is doing well.   No CP .  No dyspnea.   Has been stressful.  Her mother died this past year.   Her INR levels have been  Well controlled   Nov. 14, 2016:  Doing well.  No cardiac issues.  INR levels have been stable,  A bit low the last time .  Had an AVR 13 years ago .    June 05, 2015:  Amanda Wiggins was recently seen at the hospital for an acute anterior wall ST segment elevation myocardial infarction. She had stenting of her mid LAD. She had recurrent pain on the day of her original discharge and was taken back to the Cath Lab and had a second stent placed. She continued to have some intermittent episodes of chest discomfort. She has had persistent ST segment elevation. Her left ventricular systolic function is moderately depressed with an EF of 40-45%.   She has akinesis of the distal anterior wall and apex.   She is not having the band like chest pain that she had on presentation Has had some GERD.   Asked about going back on Omeprazole  - we discussed  her need for Protonix with the plavix .   September 01, 2015:  Amanda Wiggins is seen back for follow up visit. Still having trouble getting over her MI .  Still fatigued but is slowly getting better.   No CP.      Dec. 12, 2017:  feeling ok Does not have the energy that she wants ( and used to have prior to her MI )  BP has been in the 90-100 range.      Current Outpatient Prescriptions on File Prior to Visit  Medication Sig Dispense Refill  . ALPRAZolam (XANAX) 0.25 MG tablet Take 1 tablet (0.25 mg total) by mouth 2 (two) times daily as needed for anxiety. 60 tablet 1  . BIOTIN PO Take 1 tablet by mouth every morning.     . Calcium Carbonate-Vitamin D (CALCIUM 600+D) 600-400 MG-UNIT per tablet Take 1 tablet by mouth 2 (two) times daily.      . carvedilol (COREG) 3.125 MG tablet Take 1 tablet (3.125 mg total) by mouth 2 (two) times daily with a meal. 60 tablet 11  . clopidogrel (PLAVIX) 75 MG tablet Take 1 tablet (75 mg total) by mouth daily with breakfast. 90 tablet 3  . cycloSPORINE (RESTASIS) 0.05 %  ophthalmic emulsion Place 1 drop into both eyes 2 (two) times daily as needed.     . Desoximetasone 0.05 % GEL Apply 1 application topically as needed (AS NEEDED TO AFFECTED AREA).     . Glucosamine-Chondroit-Vit C-Mn (GLUCOSAMINE CHONDR 1500 COMPLX) CAPS Take 1 capsule by mouth every morning.     . loratadine (CLARITIN) 10 MG tablet Take 10 mg by mouth daily.      . Multiple Vitamin (MULTIVITAMIN) capsule Take 1 capsule by mouth daily.      . nitroGLYCERIN (NITROSTAT) 0.4 MG SL tablet Place 1 tablet (0.4 mg total) under the tongue every 5 (five) minutes as needed for chest pain. 25 tablet 2  . pantoprazole (PROTONIX) 40 MG tablet TAKE 1 TABLET (40 MG TOTAL) BY MOUTH DAILY. 30 tablet 8  . rosuvastatin (CRESTOR) 20 MG tablet Take 1 tablet (20 mg total) by mouth daily at 6 PM. 30 tablet 11  . senna (SENOKOT) 8.6 MG tablet Take 2 tablets by mouth daily as needed for constipation.     . temazepam  (RESTORIL) 30 MG capsule Take 1 capsule (30 mg total) by mouth at bedtime as needed. 90 capsule 1  . warfarin (COUMADIN) 5 MG tablet TAKE AS DIRECTED BY COUMADIN CLINIC 90 tablet 1   No current facility-administered medications on file prior to visit.     Allergies  Allergen Reactions  . Latex Hives  . Meperidine Hcl Nausea And Vomiting  . Sulfamethoxazole Nausea And Vomiting    REACTION: unspecified    Past Medical History:  Diagnosis Date  . Aortic valve replaced   . FHx: migraine headaches   . GERD (gastroesophageal reflux disease)   . Hiatal hernia   . Hx Breast Cancer, IDC, Stage I 07/03/2003  . Hx of breast cancer   . Hyperlipidemia   . Hypertension   . Menopausal syndrome   . Osteopenia   . 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 Status post aortic valve repair     Past Surgical History:  Procedure Laterality Date  . AORTIC VALVE REPLACEMENT  2003  . CARDIAC CATHETERIZATION     Ejection Fraction   . CARDIAC CATHETERIZATION N/A 05/16/2015   Procedure: Left Heart Cath and Coronary Angiography;  Surgeon: Leonie Man, MD;  Location: [facility];  Service: Cardiovascular;  Laterality: N/A;  . CARDIAC CATHETERIZATION  05/16/2015   Procedure: Coronary Stent Intervention;  Surgeon: Leonie Man, MD;  Location: [facility];  Service: Cardiovascular;;  . CARDIAC CATHETERIZATION N/A 05/19/2015   Procedure: Left Heart Cath and Coronary Angiography;  Surgeon: Troy Sine, MD;  Location: [facility];  Service: Cardiovascular;  Laterality: N/A;  . CARDIAC CATHETERIZATION N/A 05/19/2015   Procedure: Coronary Stent Intervention;  Surgeon: Troy Sine, MD;  Location: [facility];  Service: Cardiovascular;  Laterality: N/A;  . HIATAL HERNIA REPAIR    . hysterectomy, endometiral cancer  2001  . lummpectomy breast cancer  07/03/2003  . nissan fundoplicaiton  [id number]   post-op hematoma on heparin   . right shouler surgery      History   Smoking Status  . Never Smoker  Smokeless Tobacco  . Never Used    History  Alcohol Use  . 0.0 oz/week    Comment: socially    Family History  Problem Relation Age of Onset  . Heart attack Mother 25  . Osteoarthritis Brother     hpercholesterolemia    Reviw of Systems:  Reviewed in the HPI.  All other systems are negative.  Physical Exam: BP 100/80 (BP Location: Right Arm, Cuff Size: Normal)   Pulse 64   Ht 5\' 1"  (1.549 m)   Wt 136 lb 12.8 oz (62.1 kg)   BMI 25.85 kg/m  The patient is alert and oriented x 3.  The mood and affect are normal.   Skin: warm and dry.  Color is normal.    HEENT:   the sclera are nonicteric.  The mucous membranes are moist.  The carotids are 2+ without bruits.  There is no thyromegaly.  There is no JVD.    Lungs: clear.  The chest wall is non tender.    Heart: regular rate with a normal S1 and mechanical S2.  2/6 systolic murmur .  No  gallops, or rubs. The PMI is not displaced.     Abdomen: good bowel sounds.  There is no guarding or rebound.  There is no hepatosplenomegaly or tenderness.  There are no masses.   Extremities:  no clubbing, cyanosis, or edema.  The legs are without rashes.  The distal pulses are intact.   Neuro:  Cranial nerves II - XII are intact.  Motor and sensory functions are intact.    The gait is normal.  ECG:    Assessment / Plan:   1. Aortic valve replacement - continue current meds.      2. Hyperlipidemia - lipids look great   3. Breast cancer - stable   4. CAD - is s/p stenting of her LAD x2.  Seems to be doing well at this point .  Seems to be still sluggish but is slowly regaining her strength.  Will DC the ASA.  Continue plavix and coumadin .   5. Chronic systolic CHF:   EF is [id number].    BP is too low to start ARB.    She has days where she feels very weak and lightheaded. She finds that drinking Gatorade seems to help. I have encouraged her to drink a small can of V8 juice on  the days that she  feels poorly. We will likely get an echocardiogram after visit.  Mertie Moores, MD  02/16/2016 9:30 AM    [facility] [address],  [place], [place]  [address] Pager [phone] Phone: [phone]; Fax: [phone]

## 2016-02-16 NOTE — Patient Instructions (Addendum)
Medication Instructions:  Your physician recommends that you continue on your current medications as directed. Please refer to the Current Medication list given to you today.   Labwork: Your physician recommends that you return for lab work today: Lipid and in 6 months get a BMP/Liver/lipid   Testing/Procedures:     Follow-Up: Your physician wants you to follow-up in: 6 months with Dr Vilinda Boehringer will receive a reminder letter in the mail two months in advance. If you don't receive a letter, please call our office to schedule the follow-up appointment.   Any Other Special Instructions Will Be Listed Below (If Applicable).     If you need a refill on your cardiac medications before your next appointment, please call your pharmacy.

## 2016-02-16 NOTE — Telephone Encounter (Signed)
This encounter was created in error - please disregard.

## 2016-02-25 ENCOUNTER — Ambulatory Visit (INDEPENDENT_AMBULATORY_CARE_PROVIDER_SITE_OTHER): Payer: Medicare Other | Admitting: Family Medicine

## 2016-02-25 ENCOUNTER — Encounter: Payer: Self-pay | Admitting: Family Medicine

## 2016-02-25 DIAGNOSIS — Z23 Encounter for immunization: Secondary | ICD-10-CM | POA: Diagnosis not present

## 2016-02-25 DIAGNOSIS — W540XXA Bitten by dog, initial encounter: Secondary | ICD-10-CM | POA: Diagnosis not present

## 2016-02-25 MED ORDER — AMOXICILLIN-POT CLAVULANATE 875-125 MG PO TABS: 1.0000 | ORAL_TABLET | Freq: Two times a day (BID) | ORAL | 0 refills | Status: DC

## 2016-02-25 NOTE — Progress Notes (Signed)
Pre visit review using our clinic review tool, if applicable. No additional management support is needed unless otherwise documented below in the visit note. 

## 2016-02-25 NOTE — Patient Instructions (Signed)
Please take antibiotic as directed and follow up for further evaluation if notice any changes such as increased redness, swelling, or drainage from wound.  It was a pleasure to meet you today! Please contact coumadin clinic tomorrow as we discussed.   Animal Bite Introduction Animal bite wounds can get infected. It is important to get proper medical treatment. Ask your doctor if you need rabies treatment. Follow these instructions at home: Wound care  Follow instructions from your doctor about how to take care of your wound. Make sure you:  Wash your hands with soap and water before you change your bandage (dressing). If you cannot use soap and water, use hand sanitizer.  Change your bandage as told by your doctor.  Leave stitches (sutures), skin glue, or skin tape (adhesive) strips in place. They may need to stay in place for 2 weeks or longer. If tape strips get loose and curl up, you may trim the loose edges. Do not remove tape strips completely unless your doctor says it is okay.  Check your wound every day for signs of infection. Watch for:  Redness, swelling, or pain that gets worse.  Fluid, blood, or pus. General instructions  Take or apply over-the-counter and prescription medicines only as told by your doctor.  If you were prescribed an antibiotic, take or apply it as told by your doctor. Do not stop using the antibiotic even if your condition improves.  Keep the injured area raised (elevated) above the level of your heart while you are sitting or lying down.  If directed, apply ice to the injured area.  Put ice in a plastic bag.  Place a towel between your skin and the bag.  Leave the ice on for 20 minutes, 2-3 times per day.  Keep all follow-up visits as told by your doctor. This is important. Contact a doctor if:  You have redness, swelling, or pain that gets worse.  You have a general feeling of sickness (malaise).  You feel sick to your stomach  (nauseous).  You throw up (vomit).  You have pain that does not get better. Get help right away if:  You have a red streak going away from your wound.  You have fluid, blood, or pus coming from your wound.  You have a fever or chills.  You have trouble moving your injured area.  You have numbness or tingling anywhere on your body. This information is not intended to replace advice given to you by your health care provider. Make sure you discuss any questions you have with your health care provider. Document Released: 02/21/2005 Document Revised: 07/30/2015 Document Reviewed: 07/09/2014  2017 [employer]

## 2016-02-25 NOTE — Progress Notes (Signed)
Subjective:    Patient ID: Amanda Wiggins, female    DOB: January 29, 1946, 70 y.o.   MRN: [id number]  HPI  Amanda Wiggins is a 70 year old female who presents today for evaluation of a dog bite on her left hand that occurred 2 days ago.  She reports that her neighbor's dog bit her who she has a history of walking. She reports that dog is territorial and will occasionally bite when a toy or bone is taken from him ; he is one year old; dog is UTD with his immunizations. She denies fever, chills, sweats, N/V/D, increasing redness, swelling, or drainage from wound which is healing.  She takes coumadin and plavix and has a history of aortic valve replacement.  She reports that she has experienced bruising but did not have difficulty stopping bleeding. Wound from bite is improving per patient report. Treatment at home includes cleaning area with soap, water, and covering with bandaid.   Review of Systems  Constitutional: Negative for chills and fever.  Respiratory: Negative for cough, shortness of breath and wheezing.   Cardiovascular: Negative for chest pain and palpitations.  Gastrointestinal: Negative for abdominal pain, constipation, diarrhea, nausea and vomiting.  Musculoskeletal: Negative for myalgias.  Skin: Positive for wound. Negative for rash.  Neurological: Negative for dizziness, light-headedness and headaches.  Hematological:       History of plavix and coumadin use   Past Medical History:  Diagnosis Date  . Aortic valve replaced   . FHx: migraine headaches   . GERD (gastroesophageal reflux disease)   . Hiatal hernia   . Hx Breast Cancer, IDC, Stage I 07/03/2003  . Hx of breast cancer   . Hyperlipidemia   . Hypertension   . Menopausal syndrome   . Osteopenia   . 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 Status post aortic valve repair      Social History   Social History  . Marital status: Married    Spouse name: N/A  . Number of children: N/A  . Years of  education: N/A   Occupational History  . Not on file.   Social History Main Topics  . Smoking status: Never Smoker  . Smokeless tobacco: Never Used  . Alcohol use 0.0 oz/week     Comment: socially  . Drug use: No  . Sexual activity: Not on file   Other Topics Concern  . Not on file   Social History Narrative  . No narrative on file    Past Surgical History:  Procedure Laterality Date  . AORTIC VALVE REPLACEMENT  2003  . CARDIAC CATHETERIZATION     Ejection Fraction   . CARDIAC CATHETERIZATION N/A 05/16/2015   Procedure: Left Heart Cath and Coronary Angiography;  Surgeon: Leonie Man, MD;  Location: [facility];  Service: Cardiovascular;  Laterality: N/A;  . CARDIAC CATHETERIZATION  05/16/2015   Procedure: Coronary Stent Intervention;  Surgeon: Leonie Man, MD;  Location: [facility];  Service: Cardiovascular;;  . CARDIAC CATHETERIZATION N/A 05/19/2015   Procedure: Left Heart Cath and Coronary Angiography;  Surgeon: Troy Sine, MD;  Location: [facility];  Service: Cardiovascular;  Laterality: N/A;  . CARDIAC CATHETERIZATION N/A 05/19/2015   Procedure: Coronary Stent Intervention;  Surgeon: Troy Sine, MD;  Location: [facility];  Service: Cardiovascular;  Laterality: N/A;  . HIATAL HERNIA REPAIR    . hysterectomy, endometiral cancer  2001  . lummpectomy breast cancer  07/03/2003  .  nissan fundoplicaiton  [id number]   post-op hematoma on heparin   . right shouler surgery      Family History  Problem Relation Age of Onset  . Heart attack Mother 45  . Osteoarthritis Brother     hpercholesterolemia    Allergies  Allergen Reactions  . Latex Hives  . Meperidine Hcl Nausea And Vomiting  . Sulfamethoxazole Nausea And Vomiting    REACTION: unspecified    Current Outpatient Prescriptions on File Prior to Visit  Medication Sig Dispense Refill  . ALPRAZolam (XANAX) 0.25 MG tablet Take 1 tablet (0.25 mg total) by mouth 2 (two) times daily  as needed for anxiety. 60 tablet 1  . BIOTIN PO Take 1 tablet by mouth every morning.     . Calcium Carbonate-Vitamin D (CALCIUM 600+D) 600-400 MG-UNIT per tablet Take 1 tablet by mouth 2 (two) times daily.      . carvedilol (COREG) 3.125 MG tablet Take 1 tablet (3.125 mg total) by mouth 2 (two) times daily with a meal. 60 tablet 11  . clopidogrel (PLAVIX) 75 MG tablet Take 1 tablet (75 mg total) by mouth daily with breakfast. 90 tablet 3  . cycloSPORINE (RESTASIS) 0.05 % ophthalmic emulsion Place 1 drop into both eyes 2 (two) times daily as needed.     . Desoximetasone 0.05 % GEL Apply 1 application topically as needed (AS NEEDED TO AFFECTED AREA).     . Glucosamine-Chondroit-Vit C-Mn (GLUCOSAMINE CHONDR 1500 COMPLX) CAPS Take 1 capsule by mouth every morning.     . loratadine (CLARITIN) 10 MG tablet Take 10 mg by mouth daily.      . Multiple Vitamin (MULTIVITAMIN) capsule Take 1 capsule by mouth daily.      . nitroGLYCERIN (NITROSTAT) 0.4 MG SL tablet Place 1 tablet (0.4 mg total) under the tongue every 5 (five) minutes as needed for chest pain. 25 tablet 2  . pantoprazole (PROTONIX) 40 MG tablet TAKE 1 TABLET (40 MG TOTAL) BY MOUTH DAILY. 30 tablet 8  . rosuvastatin (CRESTOR) 20 MG tablet Take 1 tablet (20 mg total) by mouth daily at 6 PM. 30 tablet 11  . senna (SENOKOT) 8.6 MG tablet Take 2 tablets by mouth daily as needed for constipation.     . temazepam (RESTORIL) 30 MG capsule Take 1 capsule (30 mg total) by mouth at bedtime as needed. 90 capsule 1  . warfarin (COUMADIN) 5 MG tablet TAKE AS DIRECTED BY COUMADIN CLINIC 90 tablet 1   No current facility-administered medications on file prior to visit.     BP 110/68 (BP Location: Right Arm, Patient Position: Sitting, Cuff Size: Normal)   Pulse 76   Temp 98.4 F (36.9 C) (Oral)   Wt 136 lb 6.4 oz (61.9 kg)   SpO2 96%   BMI 25.77 kg/m       Objective:   Physical Exam  Constitutional: She is oriented to person, place, and time. She  appears well-developed and well-nourished.  Eyes: Pupils are equal, round, and reactive to light. No scleral icterus.  Neck: Neck supple.  Cardiovascular: Normal rate and regular rhythm.   Pulmonary/Chest: Effort normal and breath sounds normal. She has no wheezes. She has no rales.  Lymphadenopathy:    She has no cervical adenopathy.  Neurological: She is alert and oriented to person, place, and time.  Skin: Skin is warm and dry.  Ecchymosis noted on left hand surrounding wound from bite that is covered with eschar. Minimal swelling present; no redness, fluctuance or signs  of infection present. See photo  Psychiatric: She has a normal mood and affect. Her behavior is normal. Judgment and thought content normal.            Assessment & Plan:  1. Dog bite, initial encounter Bite is from dog that is known and is UTD with all immunizations. Wound is healing; provide augmentin to prevent infection; Td provided today. Advised follow up for change in wound such as drainage, redness, pain, or fever. - amoxicillin-clavulanate (AUGMENTIN) 875-125 MG tablet; Take 1 tablet by mouth 2 (two) times daily.  Dispense: 10 tablet; Refill: 0  Consent for Photo  The patient was advised that digital photo(s) will be taken today of left hand. The patient verbally consented to having these photos taken for purposes of documenting his/her condition. The patient understands that the images will be stored in their medical record.   Delano Metz, FNP-C

## 2016-03-03 ENCOUNTER — Telehealth: Payer: Self-pay | Admitting: Cardiovascular Disease

## 2016-03-03 NOTE — Telephone Encounter (Signed)
Pt states she finished the Augmentin on Tuesday she states she is aware to call us when she starts a new medication but she was out of town. I informed her that Augmentin does not interact with Coumadin so we are ok to continue with scheduled appt.

## 2016-03-03 NOTE — Telephone Encounter (Signed)
NEw Message  Pt call requesting to speak with RN. Pt states she was bite by a dog and was put on Amoxicillin- Clavulanate 875-125mg . Pt would like to know if this medication interfere with coumadin. Please call back to discuss

## 2016-03-29 ENCOUNTER — Ambulatory Visit (INDEPENDENT_AMBULATORY_CARE_PROVIDER_SITE_OTHER): Payer: Medicare Other | Admitting: *Deleted

## 2016-03-29 DIAGNOSIS — I359 Nonrheumatic aortic valve disorder, unspecified: Secondary | ICD-10-CM | POA: Diagnosis not present

## 2016-03-29 DIAGNOSIS — Z5181 Encounter for therapeutic drug level monitoring: Secondary | ICD-10-CM

## 2016-03-29 DIAGNOSIS — Z7901 Long term (current) use of anticoagulants: Secondary | ICD-10-CM

## 2016-03-29 DIAGNOSIS — Z952 Presence of prosthetic heart valve: Secondary | ICD-10-CM

## 2016-03-29 LAB — POCT INR: INR: 1.9

## 2016-04-14 ENCOUNTER — Other Ambulatory Visit: Payer: Self-pay | Admitting: Physician Assistant

## 2016-04-15 ENCOUNTER — Telehealth: Payer: Self-pay | Admitting: Cardiovascular Disease

## 2016-04-15 NOTE — Telephone Encounter (Signed)
Spoke with patient who states she is having terrible muscle aches, joint stiffness, and leg pain. She states she has noticed these symptoms progressively getting worse since increasing crestor to 20 mg last March. She woke up this morning at 0400 with leg pain and took tylenol. She states it did not help much. We discussed decreasing or stopping crestor. She states she also notices mental grogginess so she would like to hold the crestor for 2 weeks to see if these symptoms resolve.   She also complains of feeling off balance. She states she does not feel light headed or dizzy (she has felt this way in the past with hypotension and this does not feel the same). She states she will get up and start walking and will run into the wall after a few steps. She does not have hx of known inner ear problems (states she has never seen an ENT). I advised that I will review with Dr. Acie Fredrickson for possible vestibular PT evaluation. She states BP does remain in the 0000000 to 123XX123 systolic. She only takes carvedilol 3.125 mg bid.   She also states that she was walking on the treadmill yesterday and noticed chest pain. She states the pain resolved when she slowed down her walking a bit and she has not had any pain since that time. She did not take NTG. I advised her to take NTG if this pain occurs again. She denies SOB; states that her pain with the STEMI in March 2017 was not excruciating and so this concerns her. I advised that I will forward to Dr. Acie Fredrickson for advice. She verbalized understanding and agreement and thanked me for the call.

## 2016-04-15 NOTE — Telephone Encounter (Signed)
Agree with holding Crestor to see if her muscle aches resolve. If we do find that it is the crestor that is causing the muscle aches, we should refer her to lipid clinic for consideration of repatha  The dizzyness is probably not a cardiac issue I would encourage her to speak to her medical doctor and see what he or she thinks Vestibular PT may be a good option for her  Lets have her keep an eye on the exertional chest pain  If this seems to be a regular problem, we will need to do further testing  She may use SL NTG as needed

## 2016-04-15 NOTE — Telephone Encounter (Signed)
Reviewed Dr. Elmarie Shiley advice with patient who verbalized understanding and agreement with plan. She states she will call Dr. Truddie Hidden office for advice on the balance issues. She states she will monitor the chest discomfort, will take NTG, and will call back if it occurs more frequently. She is also aware that she can call the on-call provider on the weekends. She states she has not felt the same kind of chest pressure that she felt prior to her STEMI. I advised her to call back in a few weeks to let us know how she is feeling off Crestor. She verbalized understanding and agreement with plan and thanked me for the call.

## 2016-04-15 NOTE — Telephone Encounter (Signed)
Pt c/o medication issue:  1. Name of Medication: Crestor  2. How are you currently taking this medication (dosage and times per day)?  20mg   1x day 3. Are you having a reaction (difficulty breathing--STAT)? no  4. What is your medication issue? muscle pain and headaches  She went from  10-20mg 

## 2016-05-02 ENCOUNTER — Telehealth: Payer: Self-pay | Admitting: Cardiovascular Disease

## 2016-05-02 NOTE — Telephone Encounter (Signed)
New Message   Please call  Pt stopped Crestor 2 weeks ago and she was to follow up with you

## 2016-05-02 NOTE — Telephone Encounter (Signed)
Spoke with patient who states her symptoms of muscle pain, joint pain, and imbalance have resolved since stopping Crestor on 2/9. She states she believes she did not tolerate Zocor or Zetia in the past. She has been taking Crestor since at least 2013.  She would like to be referred to lipid clinic to possibly get on one of the injectable agents. I have scheduled her for lipid clinic on 3/6. She thanked me for the call.

## 2016-05-10 ENCOUNTER — Ambulatory Visit: Payer: Medicare Other | Admitting: Pharmacist

## 2016-05-10 ENCOUNTER — Ambulatory Visit (INDEPENDENT_AMBULATORY_CARE_PROVIDER_SITE_OTHER): Payer: Medicare Other | Admitting: *Deleted

## 2016-05-10 VITALS — Wt 137.0 lb

## 2016-05-10 DIAGNOSIS — E785 Hyperlipidemia, unspecified: Secondary | ICD-10-CM

## 2016-05-10 DIAGNOSIS — Z5181 Encounter for therapeutic drug level monitoring: Secondary | ICD-10-CM | POA: Diagnosis not present

## 2016-05-10 DIAGNOSIS — I359 Nonrheumatic aortic valve disorder, unspecified: Secondary | ICD-10-CM

## 2016-05-10 DIAGNOSIS — Z952 Presence of prosthetic heart valve: Secondary | ICD-10-CM

## 2016-05-10 DIAGNOSIS — Z7901 Long term (current) use of anticoagulants: Secondary | ICD-10-CM | POA: Diagnosis not present

## 2016-05-10 LAB — POCT INR: INR: 1.9

## 2016-05-10 MED ORDER — PRAVASTATIN SODIUM 20 MG PO TABS: 20.0000 mg | ORAL_TABLET | Freq: Every evening | ORAL | 11 refills | Status: DC

## 2016-05-10 NOTE — Progress Notes (Signed)
Patient ID: Amanda Wiggins                 DOB: 20-Nov-1945                    MRN: [id number]     HPI: Amanda Wiggins is a 71 y.o. female patient referred to lipid clinic by Dr Acie Fredrickson. PMH is significant for CAD and STEMI with 100% stenosis of mid LAD to dist LAD s/p PCI with DES, HLD, and AVR.  Pt increased her Crestor dose from 10mg  to 20mg  daily in March 2017 as advised but experienced headaches, muscle pain in her thighs, joint stiffness, and balance issues on the higher dose. Her symptoms progressively became worse since the dose increase. She would wake up at night from the pain and could not stand up after squatting down. She also noticed balance issues and was advised to hold her Crestor for 2 weeks to see if her symptoms resolved. She called clinic 2 weeks later and stated that her muscle and joint pain plus balance issues resolved upon Crestor discontinuation.  She recalls previously trying simvastatin and Zetia more than 15 years ago (doses unknown). She does not recall having any side effects with either but states that the Zetia did not lower her cholesterol well. She is very worried about trying Crestor again since she takes blood thinners and doesn't want to fall due to balance issues.  Current Medications: none Intolerances: rosuvastatin 20mg  daily - muscle aches and headache, simvastatin and Zetia - ineffective Risk Factors: CAD s/p STEMI and PCI with DES, family history LDL goal: 70mg /dL  Diet: Breakfast - english muffin with peanut butter and hot tea. Lunch - apple, banana, dried fruit, nuts, and yogurt (low fat) or salad. Dinner - meat and carb, likes eggplant. Avoids greens since on Coumadin. Has a sweet tooth. Cut back on cheese after MI. Knows what foods to eat - completed weight watchers 15 years ago and lost 40 lbs and has kept it off.  Exercise: Walks her dog every day and goes to the gym 3x per week (bike and treadmill). Completed cardiac rehab after her MI last  week.  Family History: Mother with MI at age 49. Maternal grandfather had MI at a young age.  Social History: Denies tobacco use, drinks alcohol socially.  Labs: 02/16/16: TC 141, TG 122, HDL 47, LDL 70 (rosuvastatin 20mg  daily)  Past Medical History:  Diagnosis Date  . Aortic valve replaced   . FHx: migraine headaches   . GERD (gastroesophageal reflux disease)   . Hiatal hernia   . Hx Breast Cancer, IDC, Stage I 07/03/2003  . Hx of breast cancer   . Hyperlipidemia   . Hypertension   . Menopausal syndrome   . Osteopenia   . 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 Status post aortic valve repair     Current Outpatient Prescriptions on File Prior to Visit  Medication Sig Dispense Refill  . ALPRAZolam (XANAX) 0.25 MG tablet Take 1 tablet (0.25 mg total) by mouth 2 (two) times daily as needed for anxiety. 60 tablet 1  . amoxicillin-clavulanate (AUGMENTIN) 875-125 MG tablet Take 1 tablet by mouth 2 (two) times daily. 10 tablet 0  . BIOTIN PO Take 1 tablet by mouth every morning.     . Calcium Carbonate-Vitamin D (CALCIUM 600+D) 600-400 MG-UNIT per tablet Take 1 tablet by mouth 2 (two) times daily.      . carvedilol (COREG) 3.125 MG tablet  Take 1 tablet (3.125 mg total) by mouth 2 (two) times daily with a meal. 60 tablet 11  . clopidogrel (PLAVIX) 75 MG tablet TAKE 1 TABLET (75 MG TOTAL) BY MOUTH DAILY WITH BREAKFAST. 30 tablet 1  . cycloSPORINE (RESTASIS) 0.05 % ophthalmic emulsion Place 1 drop into both eyes 2 (two) times daily as needed.     . Desoximetasone 0.05 % GEL Apply 1 application topically as needed (AS NEEDED TO AFFECTED AREA).     . Glucosamine-Chondroit-Vit C-Mn (GLUCOSAMINE CHONDR 1500 COMPLX) CAPS Take 1 capsule by mouth every morning.     . loratadine (CLARITIN) 10 MG tablet Take 10 mg by mouth daily.      . Multiple Vitamin (MULTIVITAMIN) capsule Take 1 capsule by mouth daily.      . nitroGLYCERIN (NITROSTAT) 0.4 MG SL tablet Place 1 tablet (0.4 mg  total) under the tongue every 5 (five) minutes as needed for chest pain. 25 tablet 2  . pantoprazole (PROTONIX) 40 MG tablet TAKE 1 TABLET (40 MG TOTAL) BY MOUTH DAILY. 30 tablet 8  . rosuvastatin (CRESTOR) 20 MG tablet Take 1 tablet (20 mg total) by mouth daily at 6 PM. (Patient taking differently: Take 20 mg by mouth daily at 6 PM. ) 30 tablet 11  . senna (SENOKOT) 8.6 MG tablet Take 2 tablets by mouth daily as needed for constipation.     . temazepam (RESTORIL) 30 MG capsule Take 1 capsule (30 mg total) by mouth at bedtime as needed. 90 capsule 1  . warfarin (COUMADIN) 5 MG tablet TAKE AS DIRECTED BY COUMADIN CLINIC 90 tablet 1   No current facility-administered medications on file prior to visit.     Allergies  Allergen Reactions  . Latex Hives  . Meperidine Hcl Nausea And Vomiting  . Sulfamethoxazole Nausea And Vomiting    REACTION: unspecified    Assessment/Plan:  1. Hyperlipidemia - LDL 70 on Crestor 20mg  daily but pt having myalgias and balance issues, baseline is likely ~140mg /dL. Goal LDL <70 given history of ASCVD. Discussed rechallenging with another statin vs PCSK9i vs clinical trials. Pt will not qualify for PCSK9i currently given issues tolerating only Crestor. She will need to have failed therapy with at least 1 other statin. She is agreeable to trying pravastatin 20mg  daily. She will call if she develops any side effects. Otherwise, will plan to check lipids in 3 months when she is due to see Dr Acie Fredrickson.   Phong Isenberg E. Jorja Empie, PharmD, CPP, [place] [id number] [address], [place], [place] [address] Phone: [phone]; Fax: [phone] 05/10/2016 3:29 PM

## 2016-05-10 NOTE — Patient Instructions (Addendum)
Start taking pravastatin 20mg  once a day in the evening  Call Megan in lipid clinic with any concerns [phone]  Recheck cholesterol in 3 months if you are tolerating the pravastatin well

## 2016-05-11 ENCOUNTER — Other Ambulatory Visit: Payer: Self-pay | Admitting: Cardiovascular Disease

## 2016-05-13 ENCOUNTER — Other Ambulatory Visit: Payer: Self-pay | Admitting: Obstetrics

## 2016-05-13 DIAGNOSIS — Z1231 Encounter for screening mammogram for malignant neoplasm of breast: Secondary | ICD-10-CM

## 2016-05-16 ENCOUNTER — Other Ambulatory Visit: Payer: Self-pay | Admitting: *Deleted

## 2016-05-16 MED ORDER — WARFARIN SODIUM 5 MG PO TABS: ORAL_TABLET | ORAL | 1 refills | Status: DC

## 2016-05-23 ENCOUNTER — Telehealth: Payer: Self-pay | Admitting: Pharmacist

## 2016-05-23 MED ORDER — ROSUVASTATIN CALCIUM 10 MG PO TABS
10.0000 mg | ORAL_TABLET | Freq: Every day | ORAL | 11 refills | Status: DC
Start: 2016-05-23 — End: 2016-06-30

## 2016-05-23 NOTE — Telephone Encounter (Signed)
Pt called to report that she has developed myalgias within the first week of starting pravastatin 20mg  daily. She is willing to retry Crestor 10mg  daily. If she has myalgias, will cut tablets in half and take 5mg  daily.

## 2016-05-31 ENCOUNTER — Ambulatory Visit (INDEPENDENT_AMBULATORY_CARE_PROVIDER_SITE_OTHER): Payer: Medicare Other | Admitting: *Deleted

## 2016-05-31 DIAGNOSIS — I359 Nonrheumatic aortic valve disorder, unspecified: Secondary | ICD-10-CM

## 2016-05-31 DIAGNOSIS — Z952 Presence of prosthetic heart valve: Secondary | ICD-10-CM

## 2016-05-31 DIAGNOSIS — Z7901 Long term (current) use of anticoagulants: Secondary | ICD-10-CM

## 2016-05-31 DIAGNOSIS — Z5181 Encounter for therapeutic drug level monitoring: Secondary | ICD-10-CM

## 2016-05-31 LAB — POCT INR: INR: 3.4

## 2016-06-14 ENCOUNTER — Ambulatory Visit (INDEPENDENT_AMBULATORY_CARE_PROVIDER_SITE_OTHER): Payer: Medicare Other | Admitting: *Deleted

## 2016-06-14 DIAGNOSIS — Z952 Presence of prosthetic heart valve: Secondary | ICD-10-CM

## 2016-06-14 DIAGNOSIS — Z5181 Encounter for therapeutic drug level monitoring: Secondary | ICD-10-CM

## 2016-06-14 DIAGNOSIS — Z7901 Long term (current) use of anticoagulants: Secondary | ICD-10-CM

## 2016-06-14 DIAGNOSIS — I359 Nonrheumatic aortic valve disorder, unspecified: Secondary | ICD-10-CM | POA: Diagnosis not present

## 2016-06-14 LAB — POCT INR: INR: 2.4

## 2016-06-24 ENCOUNTER — Ambulatory Visit
Admission: RE | Admit: 2016-06-24 | Discharge: 2016-06-24 | Disposition: A | Discharge status: Home / Self Care | Payer: Medicare Other | Source: Ambulatory Visit | Attending: Obstetrics | Admitting: Obstetrics

## 2016-06-24 DIAGNOSIS — Z1231 Encounter for screening mammogram for malignant neoplasm of breast: Secondary | ICD-10-CM

## 2016-06-24 HISTORY — DX: Malignant neoplasm of unspecified site of unspecified female breast: C50.919

## 2016-06-24 HISTORY — DX: Personal history of irradiation: Z92.3

## 2016-06-30 ENCOUNTER — Telehealth: Payer: Self-pay | Admitting: Pharmacist

## 2016-06-30 NOTE — Telephone Encounter (Signed)
Pt called to state that she is experiencing myalgias on Crestor 10mg  daily as well. She is already intolerant to pravastatin 20mg  daily and Crestor 20mg  daily. She would like to pursue PCSK9i.  Most recent LDL is 70 from December when pt was on statin therapy. She will need washout and elevated LDL in order for insurance to cover PCSK9i.  She states her PCP is checking labs on 5/4 and they will likely check her cholesterol. Advised her to stop her statin and will f/u to see if her LDL is high enough with a 1-2 week washout. If not, will reschedule lipid panel in another month so that we can pursue PCSK9i therapy.

## 2016-07-05 ENCOUNTER — Ambulatory Visit (INDEPENDENT_AMBULATORY_CARE_PROVIDER_SITE_OTHER): Payer: Medicare Other

## 2016-07-05 DIAGNOSIS — Z952 Presence of prosthetic heart valve: Secondary | ICD-10-CM | POA: Diagnosis not present

## 2016-07-05 DIAGNOSIS — I359 Nonrheumatic aortic valve disorder, unspecified: Secondary | ICD-10-CM

## 2016-07-05 DIAGNOSIS — Z7901 Long term (current) use of anticoagulants: Secondary | ICD-10-CM | POA: Diagnosis not present

## 2016-07-05 DIAGNOSIS — Z5181 Encounter for therapeutic drug level monitoring: Secondary | ICD-10-CM

## 2016-07-05 LAB — POCT INR: INR: 3.3

## 2016-07-06 ENCOUNTER — Ambulatory Visit (INDEPENDENT_AMBULATORY_CARE_PROVIDER_SITE_OTHER): Payer: Medicare Other | Admitting: Family Medicine

## 2016-07-06 ENCOUNTER — Encounter: Payer: Self-pay | Admitting: Family Medicine

## 2016-07-06 ENCOUNTER — Telehealth: Payer: Self-pay | Admitting: Pharmacist

## 2016-07-06 VITALS — BP 110/82 | HR 88 | Temp 99.2°F | Wt 134.4 lb

## 2016-07-06 DIAGNOSIS — J324 Chronic pansinusitis: Secondary | ICD-10-CM | POA: Diagnosis not present

## 2016-07-06 DIAGNOSIS — R059 Cough, unspecified: Secondary | ICD-10-CM

## 2016-07-06 DIAGNOSIS — R05 Cough: Secondary | ICD-10-CM | POA: Diagnosis not present

## 2016-07-06 MED ORDER — AMOXICILLIN-POT CLAVULANATE 875-125 MG PO TABS: 1.0000 | ORAL_TABLET | Freq: Two times a day (BID) | ORAL | 0 refills | Status: DC

## 2016-07-06 MED ORDER — BENZONATATE 100 MG PO CAPS: 100.0000 mg | ORAL_CAPSULE | Freq: Three times a day (TID) | ORAL | 0 refills | Status: DC

## 2016-07-06 NOTE — Patient Instructions (Addendum)
It was a pleasure to see you today. Please contact the coumadin clinic to report these new medications so follow up can be completed.  Sinusitis, Adult Sinusitis is soreness and inflammation of your sinuses. Sinuses are hollow spaces in the bones around your face. They are located:  Around your eyes.  In the middle of your forehead.  Behind your nose.  In your cheekbones. Your sinuses and nasal passages are lined with a stringy fluid (mucus). Mucus normally drains out of your sinuses. When your nasal tissues get inflamed or swollen, the mucus can get trapped or blocked so air cannot flow through your sinuses. This lets bacteria, viruses, and funguses grow, and that leads to infection. Follow these instructions at home: Medicines   Take, use, or apply over-the-counter and prescription medicines only as told by your doctor. These may include nasal sprays.  If you were prescribed an antibiotic medicine, take it as told by your doctor. Do not stop taking the antibiotic even if you start to feel better. Hydrate and Humidify   Drink enough water to keep your pee (urine) clear or pale yellow.  Use a cool mist humidifier to keep the humidity level in your home above 50%.  Breathe in steam for 10-15 minutes, 3-4 times a day or as told by your doctor. You can do this in the bathroom while a hot shower is running.  Try not to spend time in cool or dry air. Rest   Rest as much as possible.  Sleep with your head raised (elevated).  Make sure to get enough sleep each night. General instructions   Put a warm, moist washcloth on your face 3-4 times a day or as told by your doctor. This will help with discomfort.  Wash your hands often with soap and water. If there is no soap and water, use hand sanitizer.  Do not smoke. Avoid being around people who are smoking (secondhand smoke).  Keep all follow-up visits as told by your doctor. This is important. Contact a doctor if:  You have a  fever.  Your symptoms get worse.  Your symptoms do not get better within 10 days. Get help right away if:  You have a very bad headache.  You cannot stop throwing up (vomiting).  You have pain or swelling around your face or eyes.  You have trouble seeing.  You feel confused.  Your neck is stiff.  You have trouble breathing. This information is not intended to replace advice given to you by your health care provider. Make sure you discuss any questions you have with your health care provider. Document Released: 08/10/2007 Document Revised: 10/18/2015 Document Reviewed: 12/17/2014 Elsevier Interactive Patient Education  2017 [place] NOW OFFER   [facility] FAST TRACK!!!  SAME DAY Appointments for ACUTE CARE  Such as: Sprains, Injuries, cuts, abrasions, rashes, muscle pain, joint pain, back pain Colds, flu, sore throats, headache, allergies, cough, fever  Ear pain, sinus and eye infections Abdominal pain, nausea, vomiting, diarrhea, upset stomach Animal/insect bites  3 Easy Ways to Schedule: Walk-In Scheduling Call in scheduling Mychart Sign-up: [email or web address]

## 2016-07-06 NOTE — Telephone Encounter (Signed)
Pt called to report that started on Augmentin. Advised ok with warfarin and keep appt as scheduled. Pt states understanding and appreciation.

## 2016-07-06 NOTE — Progress Notes (Signed)
Patient ID: SUNDI SLEVIN, female   DOB: [birth year], 71 y.o.   MRN: [id number]  PCP: Nyoka Cowden, MD  Subjective:  JAKELIN TAUSSIG is a 71 y.o. year old very pleasant female patient who presents acutely ill with Upper Respiratory infection symptoms including nasal congestion, fever, chills, cough that is nonproductive, ear pressure/pain -started: 5 to 6 days ago  symptoms are worsening -previous treatments: Tylenol that has provided limited benefit  -sick contacts/travel/risks: denies flu exposure:  No recent sick exposure -Hx of: allergies that are treated with Claritin No recent antibiotics  ROS-denies fever, SOB, NVD, tooth pain  Pertinent Past Medical History-  has a past medical history of Aortic valve replaced; Breast cancer ([place]) (2005); FHx: migraine headaches; GERD (gastroesophageal reflux disease); Hiatal hernia; Hx Breast Cancer, IDC, Stage I (07/03/2003); breast cancer; Hyperlipidemia; Hypertension; Menopausal syndrome; Osteopenia; 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 (08/22/2011); Personal history of radiation therapy; and Status post aortic valve repair.  Medications- reviewed  Current Outpatient Prescriptions  Medication Sig Dispense Refill  . ALPRAZolam (XANAX) 0.25 MG tablet Take 1 tablet (0.25 mg total) by mouth 2 (two) times daily as needed for anxiety. 60 tablet 1  . BIOTIN PO Take 1 tablet by mouth every morning.     . Calcium Carbonate-Vitamin D (CALCIUM 600+D) 600-400 MG-UNIT per tablet Take 1 tablet by mouth 2 (two) times daily.      . carvedilol (COREG) 3.125 MG tablet Take 1 tablet (3.125 mg total) by mouth 2 (two) times daily with a meal. 60 tablet 11  . clopidogrel (PLAVIX) 75 MG tablet TAKE 1 TABLET (75 MG TOTAL) BY MOUTH DAILY WITH BREAKFAST. 30 tablet 7  . cycloSPORINE (RESTASIS) 0.05 % ophthalmic emulsion Place 1 drop into both eyes 2 (two) times daily as needed.     . Desoximetasone 0.05 % GEL Apply 1 application topically as needed (AS NEEDED  TO AFFECTED AREA).     . Glucosamine-Chondroit-Vit C-Mn (GLUCOSAMINE CHONDR 1500 COMPLX) CAPS Take 1 capsule by mouth every morning.     . loratadine (CLARITIN) 10 MG tablet Take 10 mg by mouth daily.      . Multiple Vitamin (MULTIVITAMIN) capsule Take 1 capsule by mouth daily.      . nitroGLYCERIN (NITROSTAT) 0.4 MG SL tablet Place 1 tablet (0.4 mg total) under the tongue every 5 (five) minutes as needed for chest pain. 25 tablet 2  . pantoprazole (PROTONIX) 40 MG tablet TAKE 1 TABLET (40 MG TOTAL) BY MOUTH DAILY. 30 tablet 8  . senna (SENOKOT) 8.6 MG tablet Take 2 tablets by mouth daily as needed for constipation.     . temazepam (RESTORIL) 30 MG capsule Take 1 capsule (30 mg total) by mouth at bedtime as needed. 90 capsule 1  . warfarin (COUMADIN) 5 MG tablet TAKE AS DIRECTED BY COUMADIN CLINIC 90 tablet 1   No current facility-administered medications for this visit.     Objective: BP 110/82 (BP Location: Right Arm, Patient Position: Sitting, Cuff Size: Normal)   Pulse 88   Temp 99.2 F (37.3 C) (Oral)   Wt 134 lb 6.4 oz (61 kg)   SpO2 96%   BMI 25.39 kg/m  Gen: NAD, resting comfortably, low grade fever; acetaminophen prior to this visit HEENT: Turbinates erythematous, TM normal, pharynx mildly erythematous with no tonsilar exudate or edema, + sinus tenderness CV: RRR no murmurs rubs or gallops Lungs: CTAB no crackles, wheeze, rhonchi Abdomen: soft/nontender/nondistended/normal bowel sounds. No rebound or guarding.  Ext: no edema Skin: warm,  dry, no rash Neuro: grossly normal, moves all extremities  Assessment/Plan:  1. Pansinusitis, unspecified chronicity Acutely ill appearance, symptoms 6 days that are worsening; will treat with Augmentin. Advised patient on supportive measures:  Get rest, drink plenty of fluids, and use tylenol as needed. Follow up if fever >101, if symptoms worsen or if symptoms are not improved in 3 days. Patient verbalizes understanding.    -  amoxicillin-clavulanate (AUGMENTIN) 875-125 MG tablet; Take 1 tablet by mouth 2 (two) times daily.  Dispense: 20 tablet; Refill: 0  2. Cough  - benzonatate (TESSALON) 100 MG capsule; Take 1 capsule (100 mg total) by mouth 3 (three) times daily.  Dispense: 20 capsule; Refill: 0  Advised patient to contact Coumadin clinic with new medication changes and she has agreed to do so.  She has been adherent to this with recent adjustments that were needed.  Finally, we reviewed reasons to return to care including if symptoms worsen or persist or new concerns arise- once again particularly shortness of breath or fever.    Laurita Quint, FNP

## 2016-07-06 NOTE — Progress Notes (Signed)
Pre visit review using our clinic review tool, if applicable. No additional management support is needed unless otherwise documented below in the visit note. 

## 2016-07-08 ENCOUNTER — Other Ambulatory Visit (INDEPENDENT_AMBULATORY_CARE_PROVIDER_SITE_OTHER): Payer: Medicare Other

## 2016-07-08 DIAGNOSIS — I10 Essential (primary) hypertension: Secondary | ICD-10-CM

## 2016-07-08 DIAGNOSIS — E785 Hyperlipidemia, unspecified: Secondary | ICD-10-CM

## 2016-07-08 LAB — CBC WITH DIFFERENTIAL/PLATELET
Basophils Absolute: 0 10*3/uL (ref 0.0–0.1)
Basophils Relative: 0.5 % (ref 0.0–3.0)
EOS PCT: 4.4 % (ref 0.0–5.0)
Eosinophils Absolute: 0.1 10*3/uL (ref 0.0–0.7)
HEMATOCRIT: 40.3 % (ref 36.0–46.0)
HEMOGLOBIN: 13.5 g/dL (ref 12.0–15.0)
Lymphocytes Relative: 36.8 % (ref 12.0–46.0)
Lymphs Abs: 1.2 10*3/uL (ref 0.7–4.0)
MCHC: 33.4 g/dL (ref 30.0–36.0)
MCV: 86.6 fl (ref 78.0–100.0)
MONOS PCT: 14.9 % — AB (ref 3.0–12.0)
Monocytes Absolute: 0.5 10*3/uL (ref 0.1–1.0)
Neutro Abs: 1.4 10*3/uL (ref 1.4–7.7)
Neutrophils Relative %: 43.4 % (ref 43.0–77.0)
Platelets: 253 10*3/uL (ref 150.0–400.0)
RBC: 4.66 Mil/uL (ref 3.87–5.11)
RDW: 13.6 % (ref 11.5–15.5)
WBC: 3.3 10*3/uL — AB (ref 4.0–10.5)

## 2016-07-08 LAB — HEPATIC FUNCTION PANEL
ALT: 20 U/L (ref 0–35)
AST: 27 U/L (ref 0–37)
Albumin: 4 g/dL (ref 3.5–5.2)
Alkaline Phosphatase: 45 U/L (ref 39–117)
BILIRUBIN TOTAL: 0.4 mg/dL (ref 0.2–1.2)
Bilirubin, Direct: 0.1 mg/dL (ref 0.0–0.3)
Total Protein: 6.3 g/dL (ref 6.0–8.3)

## 2016-07-08 LAB — LIPID PANEL
CHOL/HDL RATIO: 5
Cholesterol: 189 mg/dL (ref 0–200)
HDL: 41.5 mg/dL (ref >39.00)
LDL CALC: 117 mg/dL — AB (ref 0–99)
NONHDL: 147.18
Triglycerides: 152 mg/dL — ABNORMAL HIGH (ref 0.0–149.0)
VLDL: 30.4 mg/dL (ref 0.0–40.0)

## 2016-07-08 LAB — POC URINALSYSI DIPSTICK (AUTOMATED)
Bilirubin, UA: NEGATIVE
Blood, UA: NEGATIVE
Glucose, UA: NEGATIVE
KETONES UA: NEGATIVE
LEUKOCYTES UA: NEGATIVE
NITRITE UA: NEGATIVE
PH UA: 6 (ref 5.0–8.0)
Protein, UA: NEGATIVE
Spec Grav, UA: 1.025 (ref 1.010–1.025)
Urobilinogen, UA: 0.2 E.U./dL

## 2016-07-08 LAB — BASIC METABOLIC PANEL
BUN: 19 mg/dL (ref 6–23)
CHLORIDE: 107 meq/L (ref 96–112)
CO2: 26 mEq/L (ref 19–32)
Calcium: 9.1 mg/dL (ref 8.4–10.5)
Creatinine, Ser: 0.85 mg/dL (ref 0.40–1.20)
GFR: 70.39 mL/min (ref >60.00)
Glucose, Bld: 120 mg/dL — ABNORMAL HIGH (ref 70–99)
Potassium: 3.7 mEq/L (ref 3.5–5.1)
SODIUM: 142 meq/L (ref 135–145)

## 2016-07-08 LAB — TSH: TSH: 1.97 u[IU]/mL (ref 0.35–4.50)

## 2016-07-10 ENCOUNTER — Other Ambulatory Visit: Payer: Self-pay | Admitting: Internal Medicine

## 2016-07-10 DIAGNOSIS — G4701 Insomnia due to medical condition: Secondary | ICD-10-CM

## 2016-07-10 DIAGNOSIS — C549 Malignant neoplasm of corpus uteri, unspecified: Secondary | ICD-10-CM

## 2016-07-10 DIAGNOSIS — Z7901 Long term (current) use of anticoagulants: Secondary | ICD-10-CM

## 2016-07-10 DIAGNOSIS — Z853 Personal history of malignant neoplasm of breast: Secondary | ICD-10-CM

## 2016-07-11 ENCOUNTER — Telehealth: Payer: Self-pay | Admitting: Pharmacist

## 2016-07-11 MED ORDER — ALIROCUMAB 75 MG/ML ~~LOC~~ SOPN: 1.0000 "pen " | PEN_INJECTOR | SUBCUTANEOUS | 11 refills | Status: DC

## 2016-07-11 NOTE — Telephone Encounter (Signed)
F/u LDL has increased from 70 to 117 since stopping low dose Crestor. Will submit PA for Praluent therapy - pt is aware of plan.

## 2016-07-11 NOTE — Telephone Encounter (Signed)
Prior authorization for Praluent was approved. Rx sent to [facility]. Will verify copay with pt once it is available.

## 2016-07-13 ENCOUNTER — Telehealth: Payer: Self-pay | Admitting: Pharmacist

## 2016-07-13 NOTE — Telephone Encounter (Signed)
Pt called to report that received call from pharmacy that Praluent copay would be $100 per month. She is able to fill this month, but this will be cost-prohibitive for her long term. She will have spent $300 on medications after her first fill of Praluent. She will bring the verification of cost to her next coumadin appt and fill out PASS patient assistance paperwork. Pt states understanding and appreciation for help.

## 2016-07-15 ENCOUNTER — Encounter: Payer: Self-pay | Admitting: Internal Medicine

## 2016-07-15 ENCOUNTER — Ambulatory Visit (INDEPENDENT_AMBULATORY_CARE_PROVIDER_SITE_OTHER): Payer: Medicare Other | Admitting: Internal Medicine

## 2016-07-15 VITALS — BP 124/78 | HR 80 | Temp 97.9°F | Ht 61.0 in | Wt 135.6 lb

## 2016-07-15 DIAGNOSIS — R7302 Impaired glucose tolerance (oral): Secondary | ICD-10-CM | POA: Diagnosis not present

## 2016-07-15 DIAGNOSIS — Z9861 Coronary angioplasty status: Secondary | ICD-10-CM | POA: Diagnosis not present

## 2016-07-15 DIAGNOSIS — Z952 Presence of prosthetic heart valve: Secondary | ICD-10-CM

## 2016-07-15 DIAGNOSIS — I5022 Chronic systolic (congestive) heart failure: Secondary | ICD-10-CM | POA: Diagnosis not present

## 2016-07-15 DIAGNOSIS — E785 Hyperlipidemia, unspecified: Secondary | ICD-10-CM

## 2016-07-15 DIAGNOSIS — T386X5A Adverse effect of antigonadotrophins, antiestrogens, antiandrogens, not elsewhere classified, initial encounter: Secondary | ICD-10-CM | POA: Diagnosis not present

## 2016-07-15 DIAGNOSIS — I2510 Atherosclerotic heart disease of native coronary artery without angina pectoris: Secondary | ICD-10-CM | POA: Diagnosis not present

## 2016-07-15 DIAGNOSIS — I10 Essential (primary) hypertension: Secondary | ICD-10-CM | POA: Diagnosis not present

## 2016-07-15 DIAGNOSIS — M818 Other osteoporosis without current pathological fracture: Secondary | ICD-10-CM | POA: Diagnosis not present

## 2016-07-15 DIAGNOSIS — Z0000 Encounter for general adult medical examination without abnormal findings: Secondary | ICD-10-CM | POA: Diagnosis not present

## 2016-07-15 LAB — HEMOGLOBIN A1C: Hgb A1c MFr Bld: 6.5 % (ref 4.6–6.5)

## 2016-07-15 NOTE — Progress Notes (Signed)
Subjective:    Patient ID: Amanda Wiggins, female    DOB: [birth year], 71 y.o.   MRN: [id number]  HPI  71 year old patient who is seen today for an annual examination and Medicare wellness visit She is followed closely by cardiology.  Status post aortic valve repair.  She has a history of coroease and ischemic cardiomyopathy. She has a statin intolerance and soon  Will start PCSK inhibitor therapy. She is recovering from a sinus infection in today, feels well.  She remains on Coumadin anticoagulation Last colonoscopy 2008   Laboratory studies were reviewed.  Fasting blood sugar 120  Past Medical History:  Diagnosis Date  . Aortic valve replaced   . Breast cancer ([place]) 2005  . FHx: migraine headaches   . GERD (gastroesophageal reflux disease)   . Hiatal hernia   . Hx Breast Cancer, IDC, Stage I 07/03/2003  . Hx of breast cancer   . Hyperlipidemia   . Hypertension   . Menopausal syndrome   . Osteopenia   . 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 Personal history of radiation therapy   . Status post aortic valve repair      Social History   Social History  . Marital status: Married    Spouse name: N/A  . Number of children: N/A  . Years of education: N/A   Occupational History  . Not on file.   Social History Main Topics  . Smoking status: Never Smoker  . Smokeless tobacco: Never Used  . Alcohol use 0.0 oz/week     Comment: socially  . Drug use: No  . Sexual activity: Not on file   Other Topics Concern  . Not on file   Social History Narrative  . No narrative on file    Past Surgical History:  Procedure Laterality Date  . AORTIC VALVE REPLACEMENT  2003  . BREAST BIOPSY    . BREAST LUMPECTOMY Left 2005  . CARDIAC CATHETERIZATION     Ejection Fraction   . CARDIAC CATHETERIZATION N/A 05/16/2015   Procedure: Left Heart Cath and Coronary Angiography;  Surgeon: Leonie Man, MD;  Location: [facility];  Service: Cardiovascular;   Laterality: N/A;  . CARDIAC CATHETERIZATION  05/16/2015   Procedure: Coronary Stent Intervention;  Surgeon: Leonie Man, MD;  Location: [facility];  Service: Cardiovascular;;  . CARDIAC CATHETERIZATION N/A 05/19/2015   Procedure: Left Heart Cath and Coronary Angiography;  Surgeon: Troy Sine, MD;  Location: [facility];  Service: Cardiovascular;  Laterality: N/A;  . CARDIAC CATHETERIZATION N/A 05/19/2015   Procedure: Coronary Stent Intervention;  Surgeon: Troy Sine, MD;  Location: [facility];  Service: Cardiovascular;  Laterality: N/A;  . HIATAL HERNIA REPAIR    . hysterectomy, endometiral cancer  2001  . lummpectomy breast cancer  07/03/2003  . nissan fundoplicaiton  [id number]   post-op hematoma on heparin   . right shouler surgery      Family History  Problem Relation Age of Onset  . Heart attack Mother 58  . Osteoarthritis Brother        hpercholesterolemia    Allergies  Allergen Reactions  . Crestor [Rosuvastatin Calcium]     Myalgias on 10mg  and 20mg  daily  . Latex Hives  . Meperidine Hcl Nausea And Vomiting  . Pravastatin     myalgias  . Sulfamethoxazole Nausea And Vomiting    REACTION: unspecified    Current Outpatient Prescriptions on File Prior to  Visit  Medication Sig Dispense Refill  . Alirocumab (PRALUENT) 75 MG/ML SOPN Inject 1 pen into the skin every 14 (fourteen) days. 2 pen 11  . ALPRAZolam (XANAX) 0.25 MG tablet Take 1 tablet (0.25 mg total) by mouth 2 (two) times daily as needed for anxiety. 60 tablet 1  . amoxicillin-clavulanate (AUGMENTIN) 875-125 MG tablet Take 1 tablet by mouth 2 (two) times daily. 20 tablet 0  . benzonatate (TESSALON) 100 MG capsule Take 1 capsule (100 mg total) by mouth 3 (three) times daily. 20 capsule 0  . BIOTIN PO Take 1 tablet by mouth every morning.     . Calcium Carbonate-Vitamin D (CALCIUM 600+D) 600-400 MG-UNIT per tablet Take 1 tablet by mouth 2 (two) times daily.      . carvedilol (COREG) 3.125 MG  tablet Take 1 tablet (3.125 mg total) by mouth 2 (two) times daily with a meal. 60 tablet 11  . clopidogrel (PLAVIX) 75 MG tablet TAKE 1 TABLET (75 MG TOTAL) BY MOUTH DAILY WITH BREAKFAST. 30 tablet 7  . cycloSPORINE (RESTASIS) 0.05 % ophthalmic emulsion Place 1 drop into both eyes 2 (two) times daily as needed.     . Desoximetasone 0.05 % GEL Apply 1 application topically as needed (AS NEEDED TO AFFECTED AREA).     . Glucosamine-Chondroit-Vit C-Mn (GLUCOSAMINE CHONDR 1500 COMPLX) CAPS Take 1 capsule by mouth every morning.     . loratadine (CLARITIN) 10 MG tablet Take 10 mg by mouth daily.      . Multiple Vitamin (MULTIVITAMIN) capsule Take 1 capsule by mouth daily.      . nitroGLYCERIN (NITROSTAT) 0.4 MG SL tablet Place 1 tablet (0.4 mg total) under the tongue every 5 (five) minutes as needed for chest pain. 25 tablet 2  . pantoprazole (PROTONIX) 40 MG tablet TAKE 1 TABLET (40 MG TOTAL) BY MOUTH DAILY. 30 tablet 8  . senna (SENOKOT) 8.6 MG tablet Take 2 tablets by mouth daily as needed for constipation.     . temazepam (RESTORIL) 30 MG capsule TAKE ONE CAPSULE BY MOUTH AT BEDTIME AS NEEDED 90 capsule 1  . warfarin (COUMADIN) 5 MG tablet TAKE AS DIRECTED BY COUMADIN CLINIC 90 tablet 1   No current facility-administered medications on file prior to visit.     BP 124/78 (BP Location: Left Arm, Patient Position: Sitting, Cuff Size: Normal)   Pulse 80   Temp 97.9 F (36.6 C) (Oral)   Ht 5\' 1"  (1.549 m)   Wt 135 lb 9.6 oz (61.5 kg)   SpO2 98%   BMI 25.62 kg/m   Medicare wellness visit  1. Risk factors, based on past  M,S,F history.  Risk factors include dyslipidemia.  Patient has known coronary artery disease and ischemic cardio myopathy.  Status post aortic valve repair  2.  Physical activities:walks daily.  Has had some statin associated myalgias that have improved.  It has been limiting her activity  3.  Depression/mood:no history of major depression or mood disorder  4.   Hearing:no deficits  5.  ADL's:independent  6.  Fall risk:low  7.  Home safety:no problems identified  8.  Height weight, and visual acuity;height and weight stable no change in visual acuity  9.  Counseling:heart healthy diet more regular exercise and modest weight loss.  All encouraged  10. Lab orders based on risk factors:we'll check hepatitis C antibody and cologuard  11. Referral :follow-up cardiology  12. Care plan:continue efforts at aggressive risk factor modification 13. Cognitive assessment: alert and oriented  normal affect no cognitive dysfunction  14. Screening: Patient provided with a written and personalized 5-10 year screening schedule in the AVS.    15. Provider List Update: cardiology radiologyand OB/GYN    Review of Systems  Constitutional: Negative.   HENT: Negative for congestion, dental problem, hearing loss, rhinorrhea, sinus pressure, sore throat and tinnitus.   Eyes: Negative for pain, discharge and visual disturbance.  Respiratory: Negative for cough and shortness of breath.   Cardiovascular: Negative for chest pain, palpitations and leg swelling.  Gastrointestinal: Negative for abdominal distention, abdominal pain, blood in stool, constipation, diarrhea, nausea and vomiting.  Genitourinary: Negative for difficulty urinating, dysuria, flank pain, frequency, hematuria, pelvic pain, urgency, vaginal bleeding, vaginal discharge and vaginal pain.  Musculoskeletal: Positive for myalgias. Negative for arthralgias, gait problem and joint swelling.  Skin: Negative for rash.  Neurological: Negative for dizziness, syncope, speech difficulty, weakness, numbness and headaches.  Hematological: Negative for adenopathy.  Psychiatric/Behavioral: Negative for agitation, behavioral problems and dysphoric mood. The patient is not nervous/anxious.        Objective:   Physical Exam  Constitutional: She is oriented to person, place, and time. She appears well-developed  and well-nourished.  HENT:  Head: Normocephalic and atraumatic.  Right Ear: External ear normal.  Left Ear: External ear normal.  Mouth/Throat: Oropharynx is clear and moist.  Eyes: Conjunctivae and EOM are normal.  Neck: Normal range of motion. Neck supple. No JVD present. No thyromegaly present.  Cardiovascular: Normal rate, regular rhythm and intact distal pulses.   Murmur heard. Grade 3/6 systolic murmur loudest at the primary aortic area  Pulmonary/Chest: Effort normal and breath sounds normal. She has no wheezes. She has no rales.  Sternotomy scar  Abdominal: Soft. Bowel sounds are normal. She exhibits no distension and no mass. There is no tenderness. There is no rebound and no guarding.  Musculoskeletal: Normal range of motion. She exhibits no edema or tenderness.  Neurological: She is alert and oriented to person, place, and time. She has normal reflexes. No cranial nerve deficit. She exhibits normal muscle tone. Coordination normal.  Skin: Skin is warm and dry. No rash noted.  Psychiatric: She has a normal mood and affect. Her behavior is normal.          Assessment & Plan:   Preventive health examination Medicare wellness visit Status post aVR.  Continue Coumadin anticoagulation Dyslipidemia.  Patient to start PCSK inhibitor therapy Impaired glucose tolerance.  Weight loss more rigorous exercise encouraged.  Will check hemoglobin A1c  Preventive health.  Check hepatitis C antibody and cologuard  Nyoka Cowden

## 2016-07-15 NOTE — Patient Instructions (Addendum)
WE NOW OFFER   [facility] FAST TRACK!!!  SAME DAY Appointments for ACUTE CARE  Such as: Sprains, Injuries, cuts, abrasions, rashes, muscle pain, joint pain, back pain Colds, flu, sore throats, headache, allergies, cough, fever  Ear pain, sinus and eye infections Abdominal pain, nausea, vomiting, diarrhea, upset stomach Animal/insect bites  3 Easy Ways to Schedule: Walk-In Scheduling Call in scheduling Mychart Sign-up: [email or web address]   Limit your sodium (Salt) intake    It is important that you exercise regularly, at least 20 minutes 3 to 4 times per week.  If you develop chest pain or shortness of breath seek  medical attention.  Cardiology follow-up as scheduled  Cologuard testing  Return in 6 months for follow-up

## 2016-07-16 LAB — HEPATITIS C ANTIBODY: HCV Ab: NEGATIVE

## 2016-07-19 ENCOUNTER — Telehealth: Payer: Self-pay | Admitting: Pharmacist

## 2016-07-19 NOTE — Telephone Encounter (Signed)
Pt presents to clinic today for Praluent injection training. She successfully self-administers Praluent into the right thigh with no complications. She also completed PASS application - will submit this to see pt qualifies for copay assistance. Follow up lipids will be checked when pt sees Dr Acie Fredrickson in July.

## 2016-07-26 ENCOUNTER — Ambulatory Visit (INDEPENDENT_AMBULATORY_CARE_PROVIDER_SITE_OTHER): Payer: Medicare Other | Admitting: *Deleted

## 2016-07-26 DIAGNOSIS — I359 Nonrheumatic aortic valve disorder, unspecified: Secondary | ICD-10-CM

## 2016-07-26 DIAGNOSIS — Z7901 Long term (current) use of anticoagulants: Secondary | ICD-10-CM | POA: Diagnosis not present

## 2016-07-26 DIAGNOSIS — Z952 Presence of prosthetic heart valve: Secondary | ICD-10-CM

## 2016-07-26 DIAGNOSIS — Z5181 Encounter for therapeutic drug level monitoring: Secondary | ICD-10-CM

## 2016-07-26 LAB — POCT INR: INR: 2.4

## 2016-07-28 LAB — COLOGUARD: COLOGUARD: POSITIVE

## 2016-07-29 ENCOUNTER — Telehealth: Payer: Self-pay | Admitting: Internal Medicine

## 2016-07-29 ENCOUNTER — Telehealth: Payer: Self-pay | Admitting: Family Medicine

## 2016-07-29 ENCOUNTER — Encounter: Payer: Self-pay | Admitting: Family Medicine

## 2016-07-29 DIAGNOSIS — R195 Other fecal abnormalities: Secondary | ICD-10-CM

## 2016-07-29 NOTE — Telephone Encounter (Signed)
Pt has a positive result.  Entered in [facility].

## 2016-07-29 NOTE — Telephone Encounter (Signed)
FYI    Pts Cologuard results was faxed over on yesterday and if it has not been received pls give them a call.

## 2016-08-02 NOTE — Telephone Encounter (Signed)
Please schedule GI consultation Likely will need follow-up colonoscopy with heparin bridging

## 2016-08-03 NOTE — Telephone Encounter (Signed)
GI consult ordered.

## 2016-08-04 ENCOUNTER — Encounter: Payer: Self-pay | Admitting: Nurse Practitioner

## 2016-08-04 ENCOUNTER — Encounter: Payer: Self-pay | Admitting: Internal Medicine

## 2016-08-08 ENCOUNTER — Other Ambulatory Visit: Payer: Self-pay | Admitting: Physician Assistant

## 2016-08-10 ENCOUNTER — Encounter: Payer: Self-pay | Admitting: Nurse Practitioner

## 2016-08-10 ENCOUNTER — Telehealth: Payer: Self-pay

## 2016-08-10 ENCOUNTER — Ambulatory Visit (INDEPENDENT_AMBULATORY_CARE_PROVIDER_SITE_OTHER): Payer: Medicare Other | Admitting: Nurse Practitioner

## 2016-08-10 ENCOUNTER — Telehealth: Payer: Self-pay | Admitting: Cardiovascular Disease

## 2016-08-10 VITALS — BP 88/62 | HR 66 | Ht 61.0 in | Wt 134.0 lb

## 2016-08-10 DIAGNOSIS — I25810 Atherosclerosis of coronary artery bypass graft(s) without angina pectoris: Secondary | ICD-10-CM

## 2016-08-10 DIAGNOSIS — Z7901 Long term (current) use of anticoagulants: Secondary | ICD-10-CM | POA: Diagnosis not present

## 2016-08-10 DIAGNOSIS — R195 Other fecal abnormalities: Secondary | ICD-10-CM | POA: Diagnosis not present

## 2016-08-10 NOTE — Progress Notes (Addendum)
HPI: Patient is 71 year old female known to Dr. Carlean Purl. She had a screening colonoscopy November 2008. A single diverticulum was found in the cecum, exam otherwise normal. Follow-up colonoscopy recommended 10 years.  Patient has a mechanical valve, on Coumadin. Last year she had an MI requiring stenting and is now on Plavix as well. No chest pain, she has shortness of breath with exertion but this is chronic. Given her need for chronic Plavix and Coumadin patient opted to have a cologuard instead of a colonoscopy. She is here to discuss colonoscopy because Cologuard was positive. She has no bowel changes, no blood in her stool. No abdominal pain.    Past Medical History:  Diagnosis Date  . Aortic valve replaced   . Breast cancer ([place]) 2005  . FHx: migraine headaches   . GERD (gastroesophageal reflux disease)   . Hiatal hernia   . Hx Breast Cancer, IDC, Stage I 07/03/2003  . Hx of breast cancer   . Hyperlipidemia   . Hypertension   . Menopausal syndrome   . Osteopenia   . 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 Personal history of radiation therapy   . Status post aortic valve repair      Past Surgical History:  Procedure Laterality Date  . AORTIC VALVE REPLACEMENT  2003  . BREAST BIOPSY    . BREAST LUMPECTOMY Left 2005  . CARDIAC CATHETERIZATION     Ejection Fraction   . CARDIAC CATHETERIZATION N/A 05/16/2015   Procedure: Left Heart Cath and Coronary Angiography;  Surgeon: Leonie Man, MD;  Location: [facility];  Service: Cardiovascular;  Laterality: N/A;  . CARDIAC CATHETERIZATION  05/16/2015   Procedure: Coronary Stent Intervention;  Surgeon: Leonie Man, MD;  Location: [facility];  Service: Cardiovascular;;  . CARDIAC CATHETERIZATION N/A 05/19/2015   Procedure: Left Heart Cath and Coronary Angiography;  Surgeon: Troy Sine, MD;  Location: [facility];  Service: Cardiovascular;  Laterality: N/A;  . CARDIAC CATHETERIZATION  N/A 05/19/2015   Procedure: Coronary Stent Intervention;  Surgeon: Troy Sine, MD;  Location: [facility];  Service: Cardiovascular;  Laterality: N/A;  . HIATAL HERNIA REPAIR    . HIATAL HERNIA REPAIR  05/11/2007  . hysterectomy, endometiral cancer  2001  . lummpectomy breast cancer  07/03/2003  . nissan fundoplicaiton  9562   post-op hematoma on heparin   . right shouler surgery     Family History  Problem Relation Age of Onset  . Heart attack Mother 34  . Osteoarthritis Brother        hpercholesterolemia   Social History  Substance Use Topics  . Smoking status: Never Smoker  . Smokeless tobacco: Never Used  . Alcohol use 0.0 oz/week     Comment: socially   Current Outpatient Prescriptions  Medication Sig Dispense Refill  . Alirocumab (PRALUENT) 75 MG/ML SOPN Inject 1 pen into the skin every 14 (fourteen) days. 2 pen 11  . ALPRAZolam (XANAX) 0.25 MG tablet Take 1 tablet (0.25 mg total) by mouth 2 (two) times daily as needed for anxiety. 60 tablet 1  . amoxicillin-clavulanate (AUGMENTIN) 875-125 MG tablet Take 1 tablet by mouth 2 (two) times daily. 20 tablet 0  . benzonatate (TESSALON) 100 MG capsule Take 1 capsule (100 mg total) by mouth 3 (three) times daily. 20 capsule 0  . BIOTIN PO Take 1 tablet by mouth every morning.     . Calcium Carbonate-Vitamin D (CALCIUM  600+D) 600-400 MG-UNIT per tablet Take 1 tablet by mouth 2 (two) times daily.      . carvedilol (COREG) 3.125 MG tablet Take 1 tablet (3.125 mg total) by mouth 2 (two) times daily with a meal. 60 tablet 11  . clopidogrel (PLAVIX) 75 MG tablet TAKE 1 TABLET (75 MG TOTAL) BY MOUTH DAILY WITH BREAKFAST. 30 tablet 7  . cycloSPORINE (RESTASIS) 0.05 % ophthalmic emulsion Place 1 drop into both eyes 2 (two) times daily as needed.     . Desoximetasone 0.05 % GEL Apply 1 application topically as needed (AS NEEDED TO AFFECTED AREA).     . Glucosamine-Chondroit-Vit C-Mn (GLUCOSAMINE CHONDR 1500 COMPLX) CAPS Take 1 capsule  by mouth every morning.     . loratadine (CLARITIN) 10 MG tablet Take 10 mg by mouth daily.      . Multiple Vitamin (MULTIVITAMIN) capsule Take 1 capsule by mouth daily.      . nitroGLYCERIN (NITROSTAT) 0.4 MG SL tablet Place 1 tablet (0.4 mg total) under the tongue every 5 (five) minutes as needed for chest pain. 25 tablet 2  . pantoprazole (PROTONIX) 40 MG tablet TAKE 1 TABLET (40 MG TOTAL) BY MOUTH DAILY. 30 tablet 5  . senna (SENOKOT) 8.6 MG tablet Take 2 tablets by mouth daily as needed for constipation.     . temazepam (RESTORIL) 30 MG capsule TAKE ONE CAPSULE BY MOUTH AT BEDTIME AS NEEDED 90 capsule 1  . warfarin (COUMADIN) 5 MG tablet TAKE AS DIRECTED BY COUMADIN CLINIC 90 tablet 1   No current facility-administered medications for this visit.    Allergies  Allergen Reactions  . Crestor [Rosuvastatin Calcium]     Myalgias on 10mg  and 20mg  daily  . Latex Hives  . Meperidine Hcl Nausea And Vomiting  . Pravastatin     myalgias  . Sulfamethoxazole Nausea And Vomiting    REACTION: unspecified     Review of Systems: All systems reviewed and negative except where noted in HPI.    Physical Exam: BP (!) 88/62   Pulse 66   Ht 5\' 1"  (1.549 m)   Wt 134 lb (60.8 kg)   BMI 25.32 kg/m  Constitutional:  Well-developed, white female in no acute distress. Psychiatric: Normal mood and affect. Behavior is normal. EENT: Pupils normal.  Conjunctivae are normal. No scleral icterus. Neck supple.  Cardiovascular: Normal rate, regular rhythm. No edema Pulmonary/chest: Effort normal and breath sounds normal. No wheezing, rales or rhonchi. Abdominal: Soft, nondistended. Nontender. Bowel sounds active throughout. There are no masses palpable. No hepatomegaly. Lymphadenopathy: No cervical adenopathy noted. Neurological: Alert and oriented to person place and time. Skin: Skin is warm and dry. No rashes noted.   ASSESSMENT AND PLAN:  108. 71 year old female with positive Cologuard.  -Patient  will be scheduled for a colonoscopy with possible polypectomy.  The risks and benefits of the procedure were discussed and the patient agrees to proceed. Plavix and coumadin will need to be held. I expect that she will need a heparin or lovenox bridge.    2. Chronic anticoagulation  / anti-platelet therapy.  -Hold  coumadin and plavix 5 days before procedure - will instruct when and how to resume after procedure. Patient understands that there is a low but real risk of cardiovascular event such as heart attack, stroke, embolism, thrombosis or ischemia off plavix and ASA.  The patient consents to proceed. Will communicate by phone or EMR with patient's prescribing provider to confirm that holding plavix and coumadin is  reasonable in this case. We will contact her about bridge therapy after talking with Cardiology  3. CAD / MI, status post stenting March 2017. Echo with ejection fraction of 40-45%. On Plavix.   4. Aortic stenosis, s/p AVR on chronic coumadin.   Tye Savoy, NP  08/10/2016, 10:12 AM  Cc: Marletta Lor, MD   Agree with [name]'s assessment and plan. Gatha Mayer, MD, [facility]

## 2016-08-10 NOTE — Telephone Encounter (Signed)
[facility] [address] [place], [place]  [address] Phone:  [phone]   Fax:  [phone]  08/10/2016   RE:      Amanda Wiggins DOB:   17-Apr-1945 MRN:   [id number]   Dear Dr. Acie Fredrickson,    We have scheduled the above patient for an endoscopic procedure. Our records show that she is on anticoagulation therapy.   Please advise as to how long the patient may come off her therapy of Coumadin and Plavix; bridge instructions prior to the colonoscopy procedure, which is scheduled for 08/26/16.  Please fax back/ or route the completed form to [facility] [place] [place] at [phone].   Sincerely,    Thurmon Fair, RMA

## 2016-08-10 NOTE — Patient Instructions (Signed)
If you are age 70 or older, your body mass index should be between 23-30. Your Body mass index is 25.32 kg/m. If this is out of the aforementioned range listed, please consider follow up with your Primary Care Provider.  If you are age 40 or younger, your body mass index should be between 19-25. Your Body mass index is 25.32 kg/m. If this is out of the aformentioned range listed, please consider follow up with your Primary Care Provider.   You have been scheduled for a colonoscopy. Please follow written instructions given to you at your visit today.  Please pick up your prep supplies at the pharmacy within the next 1-3 days. If you use inhalers (even only as needed), please bring them with you on the day of your procedure. Your physician has requested that you go to www.startemmi.com and enter the access code given to you at your visit today. This web site gives a general overview about your procedure. However, you should still follow specific instructions given to you by our office regarding your preparation for the procedure.  You may have a light breakfast the morning of prep day (the day before the procedure).  You may choose from one of the following items: eggs and toast OR chicken noodle soup and crackers.  You should have your breakfast completed between 8:00 and 9:00 am the day before your procedure.   After you have had your light breakfast you should start a clear liquid diet only, NO SOLIDS. No additional solid food is allowed. You may continue to have clear liquid up to 3 hours prior to your procedure.     You will be contacted by our office prior to your procedure for directions on holding your Plavix and Coumadin. We will get bridge instructions from Dr. Cathie Olden.  If you do not hear from our office 1 week prior to your scheduled procedure, please call [phone] to discuss.   Thank you for choosing me and [facility].   Tye Savoy, NP

## 2016-08-10 NOTE — Telephone Encounter (Signed)
New message   Pt just wants to speak to rn about future colonoscopy   She said she want to give rn a heads up and speak to her about a safe plan

## 2016-08-10 NOTE — Telephone Encounter (Signed)
Patient called to report that she is very anxious about stopping coumadin and plavix for colonoscopy due to previous history. She states her INR drops quickly and she had to be hospitalized previously with lumpectomy and a gynecological surgery. She states it has taken 5-7 days in the past for her INR to normalize.  In addition, she states INR has not been normal over the past few months. She states she would like Dr. Acie Fredrickson to consult with Claiborne Billings and Jinny Blossom, our clinic pharmacists on best plan for her. I advised that I will forward this message to Dr. Acie Fredrickson and that we will call her back tomorrow. She thanked me for the call.

## 2016-08-11 NOTE — Telephone Encounter (Addendum)
Returned call to pt - she states she had a lumpectomy in 2005 and was advised to hold her Coumadin for 5 days then. States her INR dropped to 1 after 3 days and she needed to be admitted for a heparin bridge. She refuses to hold her Coumadin without using a bridging agent. Although usually do not use Lovenox bridges for AVR with no other risk factors, will bridge pt given history of INR dropping to 1 sooner than 5 day mark.  She is asking for heparin again - I had a lengthy discussion with her that for an outpatient colonoscopy, Lovenox is used as a bridge to protect against stroke. She didn't seem pleased with this since she wanted to be on the same blood thinner as last time. Again advised that heparin is only an inpatient medication and we would not admit her to the hospital to be put on a heparin drip prior to an outpatient colonoscopy.  She is agreeable to Lovenox bridging prior to colonoscopy on 6/22. Coumadin appt moved to 6/15 to coordinate Lovenox bridge with 5 day Coumadin hold.  Dr Acie Fredrickson is ok with pt holding Plavix for 5 days. Clearance routed to Dr Carlean Purl.

## 2016-08-11 NOTE — Telephone Encounter (Signed)
[name],  I agree with both of your points OK to hold Plavix for 5 day Mechanical AVRs do not typically require bridging but her INR typically falls very quickly and so we have arranged bridging in the past . Our coumadin clinic will arrange the details

## 2016-08-11 NOTE — Telephone Encounter (Signed)
Note from Martin

## 2016-08-11 NOTE — Telephone Encounter (Signed)
She needs a colonoscopy to evaluate a + Cologuard  I am fine with a bridge though my understanding is that most AVR's don't need.  Best to also hold clopidogrel 5 days please.  Thanks  Gatha Mayer, MD, [facility]

## 2016-08-11 NOTE — Telephone Encounter (Signed)
Thanks very much.

## 2016-08-11 NOTE — Telephone Encounter (Signed)
Amanda Wiggins and Amanda Wiggins, Please address this issue Thanks

## 2016-08-11 NOTE — Telephone Encounter (Signed)
Pt ok to hold Plavix for 5 days prior per Dr Acie Fredrickson.  Ok to hold warfarin for 5 days prior however pt prefers to be bridged. Will coordinate Lovenox bridge in clinic 1 week prior to procedure.

## 2016-08-12 ENCOUNTER — Encounter: Payer: Self-pay | Admitting: Internal Medicine

## 2016-08-12 NOTE — Telephone Encounter (Signed)
Spoke with patient this morning regarding holding Plavix 5 days prior to procedure and Lovenox bridge 1 week prior to procedure.  Patient stated that Dr. Acie Fredrickson office had already contacted her.

## 2016-08-15 ENCOUNTER — Other Ambulatory Visit (HOSPITAL_BASED_OUTPATIENT_CLINIC_OR_DEPARTMENT_OTHER): Payer: Medicare Other

## 2016-08-15 ENCOUNTER — Ambulatory Visit (HOSPITAL_BASED_OUTPATIENT_CLINIC_OR_DEPARTMENT_OTHER): Payer: Medicare Other | Admitting: Hematology & Oncology

## 2016-08-15 ENCOUNTER — Ambulatory Visit (HOSPITAL_BASED_OUTPATIENT_CLINIC_OR_DEPARTMENT_OTHER): Payer: Medicare Other

## 2016-08-15 DIAGNOSIS — M810 Age-related osteoporosis without current pathological fracture: Secondary | ICD-10-CM

## 2016-08-15 DIAGNOSIS — I4891 Unspecified atrial fibrillation: Secondary | ICD-10-CM | POA: Diagnosis not present

## 2016-08-15 DIAGNOSIS — Z853 Personal history of malignant neoplasm of breast: Secondary | ICD-10-CM | POA: Diagnosis not present

## 2016-08-15 DIAGNOSIS — M818 Other osteoporosis without current pathological fracture: Secondary | ICD-10-CM

## 2016-08-15 DIAGNOSIS — Z7901 Long term (current) use of anticoagulants: Secondary | ICD-10-CM

## 2016-08-15 DIAGNOSIS — C549 Malignant neoplasm of corpus uteri, unspecified: Secondary | ICD-10-CM

## 2016-08-15 DIAGNOSIS — G4701 Insomnia due to medical condition: Secondary | ICD-10-CM

## 2016-08-15 DIAGNOSIS — Z952 Presence of prosthetic heart valve: Secondary | ICD-10-CM | POA: Diagnosis not present

## 2016-08-15 DIAGNOSIS — T386X5A Adverse effect of antigonadotrophins, antiestrogens, antiandrogens, not elsewhere classified, initial encounter: Principal | ICD-10-CM

## 2016-08-15 LAB — CMP (CANCER CENTER ONLY)
ALK PHOS: 49 U/L (ref 26–84)
ALT: 9 U/L — AB (ref 10–47)
AST: 32 U/L (ref 11–38)
Albumin: 3.5 g/dL (ref 3.3–5.5)
BILIRUBIN TOTAL: 0.6 mg/dL (ref 0.20–1.60)
BUN: 16 mg/dL (ref 7–22)
CO2: 29 meq/L (ref 18–33)
CREATININE: 1 mg/dL (ref 0.6–1.2)
Calcium: 9.2 mg/dL (ref 8.0–10.3)
Chloride: 109 mEq/L — ABNORMAL HIGH (ref 98–108)
GLUCOSE: 108 mg/dL (ref 73–118)
Potassium: 4.1 mEq/L (ref 3.3–4.7)
SODIUM: 141 meq/L (ref 128–145)
Total Protein: 6.7 g/dL (ref 6.4–8.1)

## 2016-08-15 LAB — CBC WITH DIFFERENTIAL (CANCER CENTER ONLY)
BASO#: 0 10*3/uL (ref 0.0–0.2)
BASO%: 0.2 % (ref 0.0–2.0)
EOS%: 2 % (ref 0.0–7.0)
Eosinophils Absolute: 0.1 10*3/uL (ref 0.0–0.5)
HCT: 43.4 % (ref 34.8–46.6)
HGB: 14.3 g/dL (ref 11.6–15.9)
LYMPH#: 1.3 10*3/uL (ref 0.9–3.3)
LYMPH%: 25.3 % (ref 14.0–48.0)
MCH: 29.3 pg (ref 26.0–34.0)
MCHC: 32.9 g/dL (ref 32.0–36.0)
MCV: 89 fL (ref 81–101)
MONO#: 0.4 10*3/uL (ref 0.1–0.9)
MONO%: 8 % (ref 0.0–13.0)
NEUT%: 64.5 % (ref 39.6–80.0)
NEUTROS ABS: 3.2 10*3/uL (ref 1.5–6.5)
Platelets: 297 10*3/uL (ref 145–400)
RBC: 4.88 10*6/uL (ref 3.70–5.32)
RDW: 13 % (ref 11.1–15.7)
WBC: 5 10*3/uL (ref 3.9–10.0)

## 2016-08-15 MED ORDER — ZOLEDRONIC ACID 4 MG/100ML IV SOLN
4.0000 mg | Freq: Once | INTRAVENOUS | Status: AC
  Administered 2016-08-15: 4 mg via INTRAVENOUS
  Filled 2016-08-15: qty 100

## 2016-08-15 MED ORDER — TEMAZEPAM 30 MG PO CAPS: 30.0000 mg | ORAL_CAPSULE | Freq: Every evening | ORAL | 1 refills | Status: DC | PRN

## 2016-08-15 NOTE — Patient Instructions (Signed)

## 2016-08-15 NOTE — Progress Notes (Signed)
Hematology and Oncology Follow Up Visit  Amanda Wiggins [id number] 11-Jun-1945 71 y.o. 08/15/2016   Principle Diagnosis:  1. Stage I (T1 N0 M0) ductal carcinoma of the left breast. 2. Mechanical aortic valve.  Current Therapy:    Coumadin-lifelong  Zometa 5 mg IV q. Year - given in June 2018     Interim History:  [name] is back for followup. She is doing okay.   The main problem now is a fact that she needs a colonoscopy. She saw her family doctor. He ordered a ColoGuard test. This was positive. As such, she will now need a colonoscopy. She is on both Coumadin and Plavix. She will be bridged over to Lovenox. She is a little have my question is useful people spleens all without IV do a good job worried about doing this. I reassured her that I do not think this would be a problem.  Otherwise, she's had no issues. She had a mammogram in April. This her not to be okay.   She's had no bleeding with the Coumadin.   She's had no problems with nausea or vomiting. She's had no rashes. There's been no leg swelling.   Overall, her performance status is ECOG 1.   Medications:  Current Outpatient Prescriptions:  .  Alirocumab (PRALUENT) 75 MG/ML SOPN, Inject 1 pen into the skin every 14 (fourteen) days., Disp: 2 pen, Rfl: 11 .  ALPRAZolam (XANAX) 0.25 MG tablet, Take 1 tablet (0.25 mg total) by mouth 2 (two) times daily as needed for anxiety., Disp: 60 tablet, Rfl: 1 .  BIOTIN PO, Take 1 tablet by mouth every morning. , Disp: , Rfl:  .  Calcium Carbonate-Vitamin D (CALCIUM 600+D) 600-400 MG-UNIT per tablet, Take 1 tablet by mouth 2 (two) times daily.  , Disp: , Rfl:  .  carvedilol (COREG) 3.125 MG tablet, Take 1 tablet (3.125 mg total) by mouth 2 (two) times daily with a meal., Disp: 60 tablet, Rfl: 11 .  clopidogrel (PLAVIX) 75 MG tablet, TAKE 1 TABLET (75 MG TOTAL) BY MOUTH DAILY WITH BREAKFAST., Disp: 30 tablet, Rfl: 7 .  cycloSPORINE (RESTASIS) 0.05 % ophthalmic emulsion, Place 1  drop into both eyes 2 (two) times daily as needed. , Disp: , Rfl:  .  Desoximetasone 0.05 % GEL, Apply 1 application topically as needed (AS NEEDED TO AFFECTED AREA). , Disp: , Rfl:  .  Glucosamine-Chondroit-Vit C-Mn (GLUCOSAMINE CHONDR 1500 COMPLX) CAPS, Take 1 capsule by mouth every morning. , Disp: , Rfl:  .  Influenza Vac Split High-Dose 0.5 ML SUSY, Fluzone High-Dose 2015-16 (PF) 180 mcg/0.5 mL intramuscular syringe  TO BE ADMINISTERED BY PHARMACIST FOR IMMUNIZATION, Disp: , Rfl:  .  loratadine (CLARITIN) 10 MG tablet, Take 10 mg by mouth daily.  , Disp: , Rfl:  .  Multiple Vitamin (MULTIVITAMIN) capsule, Take 1 capsule by mouth daily.  , Disp: , Rfl:  .  nitroGLYCERIN (NITROSTAT) 0.4 MG SL tablet, Place 1 tablet (0.4 mg total) under the tongue every 5 (five) minutes as needed for chest pain., Disp: 25 tablet, Rfl: 2 .  pantoprazole (PROTONIX) 40 MG tablet, TAKE 1 TABLET (40 MG TOTAL) BY MOUTH DAILY., Disp: 30 tablet, Rfl: 5 .  senna (SENOKOT) 8.6 MG tablet, Take 2 tablets by mouth daily as needed for constipation. , Disp: , Rfl:  .  SHINGRIX injection, TO BE ADMINISTERED BY PHARMACIST FOR IMMUNIZATION, Disp: , Rfl: 0 .  temazepam (RESTORIL) 30 MG capsule, TAKE ONE CAPSULE BY MOUTH AT  BEDTIME AS NEEDED, Disp: 90 capsule, Rfl: 1 .  warfarin (COUMADIN) 5 MG tablet, TAKE AS DIRECTED BY COUMADIN CLINIC, Disp: 90 tablet, Rfl: 1  Allergies:  Allergies  Allergen Reactions  . Meperidine Nausea Only  . Sulfa Antibiotics Nausea Only  . Crestor [Rosuvastatin Calcium]     Myalgias on 10mg  and 20mg  daily  . Latex Hives  . Meperidine Hcl Nausea And Vomiting  . Pravastatin     myalgias  . Sulfamethoxazole Nausea And Vomiting    REACTION: unspecified    Past Medical History, Surgical history, Social history, and Family History were reviewed and updated.  Review of Systems: As above  Physical Exam:  weight is 135 lb (61.2 kg). Her oral temperature is 98 F (36.7 C). Her blood pressure is  142/90 (abnormal) and her pulse is 59 (abnormal). Her respiration is 19 and oxygen saturation is 98%.   Head and neck exam shows no ocular or oral lesions. She has no palpable cervical or supraclavicular lymph nodes. Lungs are clear. Cardiac exam regular rate and rhythm. There are no murmurs, rubs or bruits. She has a systolic click from her mechanical aortic valve. Her breast exam shows right breast no masses edema or erythema. The right axillary adenopathy. Left breast has a well-healed lumpectomy scar at the 12:00 position. There is no left axillary adenopathy. Abdomen is soft. She has good bowel sounds. There is no palpable liver or spleen. Back exam shows no tenderness over the spine, ribs or hips. Extremities shows no clubbing cyanosis or edema. Skin exam Scattered ecchymoses. She has some petechia on her ankles. Neurological exam is nonfocal.  Lab Results  Component Value Date   WBC 5.0 08/15/2016   HGB 14.3 08/15/2016   HCT 43.4 08/15/2016   MCV 89 08/15/2016   PLT 297 08/15/2016     Chemistry      Component Value Date/Time   NA 141 08/15/2016 0931   NA 142 02/12/2015 1125   K 4.1 08/15/2016 0931   K 3.8 02/12/2015 1125   CL 109 (H) 08/15/2016 0931   CO2 29 08/15/2016 0931   CO2 24 02/12/2015 1125   BUN 16 08/15/2016 0931   BUN 14.7 02/12/2015 1125   CREATININE 1.0 08/15/2016 0931   CREATININE 1.0 02/12/2015 1125      Component Value Date/Time   CALCIUM 9.2 08/15/2016 0931   CALCIUM 10.2 02/12/2015 1125   ALKPHOS 49 08/15/2016 0931   ALKPHOS 56 02/12/2015 1125   AST 32 08/15/2016 0931   AST 35 (H) 02/12/2015 1125   ALT 9 (L) 08/15/2016 0931   ALT 39 02/12/2015 1125   BILITOT 0.60 08/15/2016 0931   BILITOT 0.45 02/12/2015 1125       Impression and Plan: Amanda Wiggins is 13-year  old female with a history of stage I infiltrating duct carcinoma the left breast. She underwent lumpectomy.  She  had been on Femara. She had radiation. It has been 10  years now.  I really do  not see any problems with her having the colonoscopy. I think getting her onto Lovenox will be a great idea and make things very safe for her so that she does not run into vascular issues from the atrial fibrillation.  She will go ahead and get her Zometa today.   Volanda Napoleon, MD 6/11/201810:28 AM

## 2016-08-17 NOTE — Telephone Encounter (Signed)
Peter Congo, you are probably already aware of this decision about bridge but I am forwarding you the messages. Cardiology will bridge her with lovenox. Thanks

## 2016-08-19 ENCOUNTER — Ambulatory Visit (INDEPENDENT_AMBULATORY_CARE_PROVIDER_SITE_OTHER): Payer: Medicare Other | Admitting: Pharmacist

## 2016-08-19 DIAGNOSIS — Z7901 Long term (current) use of anticoagulants: Secondary | ICD-10-CM

## 2016-08-19 DIAGNOSIS — I359 Nonrheumatic aortic valve disorder, unspecified: Secondary | ICD-10-CM

## 2016-08-19 DIAGNOSIS — Z5181 Encounter for therapeutic drug level monitoring: Secondary | ICD-10-CM

## 2016-08-19 DIAGNOSIS — Z952 Presence of prosthetic heart valve: Secondary | ICD-10-CM

## 2016-08-19 LAB — POCT INR: INR: 2.2

## 2016-08-19 MED ORDER — ENOXAPARIN SODIUM 60 MG/0.6ML ~~LOC~~ SOLN
60.0000 mg | Freq: Two times a day (BID) | SUBCUTANEOUS | 1 refills | Status: DC
Start: 2016-08-19 — End: 2016-09-12

## 2016-08-19 NOTE — Patient Instructions (Signed)
08/20/16: Last dose of Coumadin.  08/21/16: No Coumadin or Lovenox.  08/22/16: Inject Lovenox 60mg  in the fatty abdominal tissue at least 2 inches from the belly button twice a day about 12 hours apart, 8am and 8pm rotate sites. No Coumadin.  08/23/16: Inject Lovenox in the fatty tissue every 12 hours, 8am and 8pm. No Coumadin.  08/24/16: Inject Lovenox in the fatty tissue every 12 hours, 8am and 8pm. No Coumadin.  08/25/16: Inject Lovenox in the fatty tissue in the morning at 8 am (No PM dose). No Coumadin.  08/26/16: Procedure Day - No Lovenox - Resume Coumadin in the evening or as directed by doctor (take an extra half tablet with usual dose for 2 days then resume normal dose).  08/27/16: Resume Lovenox inject in the fatty tissue every 12 hours and take Coumadin.  08/28/16: Inject Lovenox in the fatty tissue every 12 hours and take Coumadin.  08/29/16: Inject Lovenox in the fatty tissue every 12 hours and take Coumadin.  08/30/16: Inject Lovenox in the fatty tissue every 12 hours and take Coumadin.  08/31/16: Inject Lovenox in the fatty tissue every 12 hours and take Coumadin.  09/01/16: Coumadin appt to check INR.

## 2016-08-26 ENCOUNTER — Ambulatory Visit (AMBULATORY_SURGERY_CENTER): Payer: Medicare Other | Admitting: Internal Medicine

## 2016-08-26 ENCOUNTER — Encounter: Payer: Self-pay | Admitting: Internal Medicine

## 2016-08-26 VITALS — BP 123/48 | HR 60 | Temp 98.0°F | Resp 13 | Ht 61.0 in | Wt 134.0 lb

## 2016-08-26 DIAGNOSIS — R195 Other fecal abnormalities: Secondary | ICD-10-CM | POA: Diagnosis not present

## 2016-08-26 MED ORDER — SODIUM CHLORIDE 0.9 % IV SOLN: 500.0000 mL | INTRAVENOUS | Status: DC

## 2016-08-26 NOTE — Op Note (Signed)
Amanda Wiggins Patient Name: [name] Procedure Date: 08/26/2016 3:05 PM MRN: [id number] Endoscopist: Gatha Mayer , MD Age: 71 Referring MD:  Date of Birth: August 15, 1945 Gender: Female Account #: [id number] Procedure:                Colonoscopy Indications:              Positive Cologuard test Medicines:                Propofol per Anesthesia, Monitored Anesthesia Care Procedure:                Pre-Anesthesia Assessment:                           - Prior to the procedure, a History and Physical                            was performed, and patient medications and                            allergies were reviewed. The patient's tolerance of                            previous anesthesia was also reviewed. The risks                            and benefits of the procedure and the sedation                            options and risks were discussed with the patient.                            All questions were answered, and informed consent                            was obtained. Prior Anticoagulants: The patient                            last took Coumadin (warfarin) 5 days, Lovenox                            (enoxaparin) 1 day and Plavix (clopidogrel) 5 days                            prior to the procedure. ASA Grade Assessment: II -                            A patient with mild systemic disease. After                            reviewing the risks and benefits, the patient was                            deemed in satisfactory condition to undergo the  procedure.                           After obtaining informed consent, the colonoscope                            was passed under direct vision. Throughout the                            procedure, the patient's blood pressure, pulse, and                            oxygen saturations were monitored continuously. The                            Model CF-HQ190L [id number]) scope was introduced                             through the anus and advanced to the the cecum,                            identified by appendiceal orifice and ileocecal                            valve. The colonoscopy was performed without                            difficulty. The patient tolerated the procedure                            well. The quality of the bowel preparation was                            excellent. The bowel preparation used was Miralax.                            The ileocecal valve, appendiceal orifice, and                            rectum were photographed. Scope In: 3:20:38 PM Scope Out: 3:34:56 PM Scope Withdrawal Time: 0 hours 10 minutes 3 seconds  Total Procedure Duration: 0 hours 14 minutes 18 seconds  Findings:                 The perianal and digital rectal examinations were                            normal.                           External hemorrhoids were found during                            retroflexion. The hemorrhoids were small.  Multiple diverticula were found in the sigmoid                            colon.                           The exam was otherwise without abnormality on                            direct and retroflexion views. Complications:            No immediate complications. Estimated Blood Loss:     Estimated blood loss: none. Impression:               - External hemorrhoids.                           - Diverticulosis in the sigmoid colon.                           - The examination was otherwise normal on direct                            and retroflexion views.                           - No specimens collected. Recommendation:           - Patient has a contact number available for                            emergencies. The signs and symptoms of potential                            delayed complications were discussed with the                            patient. Return to normal activities tomorrow.                             Written discharge instructions were provided to the                            patient.                           - Resume previous diet.                           - Continue present medications.                           - Resume Coumadin (warfarin) today, Lovenox                            (enoxaparin) tomorrow and Plavix (clopidogrel)  today at prior doses. Refer to Coumadin Clinic for                            further adjustment of therapy.                           - No repeat colonoscopy due to age and the absence                            of colonic polyps. Gatha Mayer, MD 08/26/2016 3:42:19 PM This report has been signed electronically.

## 2016-08-26 NOTE — Patient Instructions (Addendum)
No polyps or cancer seen.  The Cologuard was probably + due to small amounts of blood leaking - probably from hemorrhoids (we all have those).  08/26/16: Procedure Day - No Lovenox - Resume Coumadin in the evening or as directed by doctor (take an extra half tablet with usual dose for 2 days then resume normal dose).  08/27/16: Resume Lovenox inject in the fatty tissue every 12 hours and take Coumadin.  08/28/16: Inject Lovenox in the fatty tissue every 12 hours and take Coumadin.  08/29/16: Inject Lovenox in the fatty tissue every 12 hours and take Coumadin.  08/30/16: Inject Lovenox in the fatty tissue every 12 hours and take Coumadin.  08/31/16: Inject Lovenox in the fatty tissue every 12 hours and take Coumadin.  09/01/16: Coumadin appt to check INR.   I do not recommend further routine colon cancer screening in your case.  I appreciate the opportunity to care for you. Gatha Mayer, MD, [facility]  Hemorrhoid handout given to patient. Diverticulosis handout given to patient.  YOU HAD AN ENDOSCOPIC PROCEDURE TODAY AT [facility]:   Refer to the procedure report that was given to you for any specific questions about what was found during the examination.  If the procedure report does not answer your questions, please call your gastroenterologist to clarify.  If you requested that your care partner not be given the details of your procedure findings, then the procedure report has been included in a sealed envelope for you to review at your convenience later.  YOU SHOULD EXPECT: Some feelings of bloating in the abdomen. Passage of more gas than usual.  Walking can help get rid of the air that was put into your GI tract during the procedure and reduce the bloating. If you had a lower endoscopy (such as a colonoscopy or flexible sigmoidoscopy) you may notice spotting of blood in your stool or on the toilet paper. If you underwent a bowel prep for your procedure, you  may not have a normal bowel movement for a few days.  Please Note:  You might notice some irritation and congestion in your nose or some drainage.  This is from the oxygen used during your procedure.  There is no need for concern and it should clear up in a day or so.  SYMPTOMS TO REPORT IMMEDIATELY:   Following lower endoscopy (colonoscopy or flexible sigmoidoscopy):  Excessive amounts of blood in the stool  Significant tenderness or worsening of abdominal pains  Swelling of the abdomen that is new, acute  Fever of 100F or higher For urgent or emergent issues, a gastroenterologist can be reached at any hour by calling [phone].   DIET:  We do recommend a small meal at first, but then you may proceed to your regular diet.  Drink plenty of fluids but you should avoid alcoholic beverages for 24 hours.  ACTIVITY:  You should plan to take it easy for the rest of today and you should NOT DRIVE or use heavy machinery until tomorrow (because of the sedation medicines used during the test).    FOLLOW UP: Our staff will call the number listed on your records the next business day following your procedure to check on you and address any questions or concerns that you may have regarding the information given to you following your procedure. If we do not reach you, we will leave a message.  However, if you are feeling well and you are not experiencing any problems, there  is no need to return our call.  We will assume that you have returned to your regular daily activities without incident.  If any biopsies were taken you will be contacted by phone or by letter within the next 1-3 weeks.  Please call us at [phone] if you have not heard about the biopsies in 3 weeks.    SIGNATURES/CONFIDENTIALITY: You and/or your care partner have signed paperwork which will be entered into your electronic medical record.  These signatures attest to the fact that that the information above on your After  Visit Summary has been reviewed and is understood.  Full responsibility of the confidentiality of this discharge information lies with you and/or your care-partner.

## 2016-08-26 NOTE — Progress Notes (Signed)
To PACU VSS. Report to [email or web address] 

## 2016-08-26 NOTE — Progress Notes (Signed)
Pt's states no medical or surgical changes since previsit or office visit. 

## 2016-08-29 ENCOUNTER — Telehealth: Payer: Self-pay | Admitting: *Deleted

## 2016-08-29 NOTE — Telephone Encounter (Signed)
  Follow up Call-  Call back number 08/26/2016  Post procedure Call Back phone  # [phone]  Permission to leave phone message Yes  Some recent data might be hidden     Patient questions:  Do you have a fever, pain , or abdominal swelling? No. Pain Score  0 *  Have you tolerated food without any problems? Yes.    Have you been able to return to your normal activities? Yes.    Do you have any questions about your discharge instructions: Diet   No. Medications  No. Follow up visit  No.  Do you have questions or concerns about your Care? no  Actions: * If pain score is 4 or above: No action needed, pain <4.

## 2016-09-01 ENCOUNTER — Ambulatory Visit (INDEPENDENT_AMBULATORY_CARE_PROVIDER_SITE_OTHER): Payer: Medicare Other

## 2016-09-01 DIAGNOSIS — I359 Nonrheumatic aortic valve disorder, unspecified: Secondary | ICD-10-CM

## 2016-09-01 DIAGNOSIS — Z5181 Encounter for therapeutic drug level monitoring: Secondary | ICD-10-CM

## 2016-09-01 DIAGNOSIS — Z952 Presence of prosthetic heart valve: Secondary | ICD-10-CM

## 2016-09-01 DIAGNOSIS — Z7901 Long term (current) use of anticoagulants: Secondary | ICD-10-CM

## 2016-09-01 LAB — POCT INR: INR: 1.8

## 2016-09-12 ENCOUNTER — Ambulatory Visit (INDEPENDENT_AMBULATORY_CARE_PROVIDER_SITE_OTHER): Payer: Medicare Other | Admitting: *Deleted

## 2016-09-12 DIAGNOSIS — Z5181 Encounter for therapeutic drug level monitoring: Secondary | ICD-10-CM

## 2016-09-12 DIAGNOSIS — I359 Nonrheumatic aortic valve disorder, unspecified: Secondary | ICD-10-CM

## 2016-09-12 DIAGNOSIS — Z7901 Long term (current) use of anticoagulants: Secondary | ICD-10-CM

## 2016-09-12 DIAGNOSIS — Z952 Presence of prosthetic heart valve: Secondary | ICD-10-CM | POA: Diagnosis not present

## 2016-09-12 LAB — POCT INR: INR: 2.2

## 2016-09-21 ENCOUNTER — Ambulatory Visit (INDEPENDENT_AMBULATORY_CARE_PROVIDER_SITE_OTHER): Payer: Medicare Other | Admitting: Cardiovascular Disease

## 2016-09-21 ENCOUNTER — Encounter: Payer: Self-pay | Admitting: Cardiovascular Disease

## 2016-09-21 VITALS — BP 118/70 | HR 64 | Ht 61.0 in | Wt 136.0 lb

## 2016-09-21 DIAGNOSIS — Z952 Presence of prosthetic heart valve: Secondary | ICD-10-CM

## 2016-09-21 DIAGNOSIS — I2510 Atherosclerotic heart disease of native coronary artery without angina pectoris: Secondary | ICD-10-CM

## 2016-09-21 DIAGNOSIS — E782 Mixed hyperlipidemia: Secondary | ICD-10-CM | POA: Diagnosis not present

## 2016-09-21 DIAGNOSIS — I5022 Chronic systolic (congestive) heart failure: Secondary | ICD-10-CM | POA: Diagnosis not present

## 2016-09-21 LAB — COMPREHENSIVE METABOLIC PANEL
ALBUMIN: 4.2 g/dL (ref 3.6–4.8)
ALT: 17 IU/L (ref 0–32)
AST: 25 IU/L (ref 0–40)
Albumin/Globulin Ratio: 2.1 (ref 1.2–2.2)
Alkaline Phosphatase: 54 IU/L (ref 39–117)
BUN / CREAT RATIO: 16 (ref 12–28)
BUN: 15 mg/dL (ref 8–27)
Bilirubin Total: 0.4 mg/dL (ref 0.0–1.2)
CALCIUM: 9 mg/dL (ref 8.7–10.3)
CO2: 23 mmol/L (ref 20–29)
Chloride: 104 mmol/L (ref 96–106)
Creatinine, Ser: 0.93 mg/dL (ref 0.57–1.00)
GFR calc Af Amer: 73 mL/min/{1.73_m2} (ref >59)
GFR, EST NON AFRICAN AMERICAN: 63 mL/min/{1.73_m2} (ref >59)
GLOBULIN, TOTAL: 2 g/dL (ref 1.5–4.5)
GLUCOSE: 105 mg/dL — AB (ref 65–99)
Potassium: 4.1 mmol/L (ref 3.5–5.2)
SODIUM: 142 mmol/L (ref 134–144)
TOTAL PROTEIN: 6.2 g/dL (ref 6.0–8.5)

## 2016-09-21 LAB — LIPID PANEL
CHOL/HDL RATIO: 2.8 ratio (ref 0.0–4.4)
CHOLESTEROL TOTAL: 145 mg/dL (ref 100–199)
HDL: 51 mg/dL (ref >39)
LDL CALC: 73 mg/dL (ref 0–99)
TRIGLYCERIDES: 105 mg/dL (ref 0–149)
VLDL CHOLESTEROL CAL: 21 mg/dL (ref 5–40)

## 2016-09-21 NOTE — Progress Notes (Signed)
Amanda Wiggins Date of Birth  03/08/1945 [facility] [address]    [place], Amanda Wiggins  [address] [phone]  Fax  [phone]  Problem List 1. Aortic valve replacement 2. Hyperlipidemia 3. Breast cancer 4. CAD -  1. Mid LAD to Dist LAD lesion, 100% stenosed. Post intervention with a single Synergy DES 2.25 mm x 20 mm S/p mini vision stent to distal LAD   History of Present Illness:  Amanda Wiggins is a 71 year old female with a history of aortic stenosis-status post aortic valve replacement.  She also has a history of hypercholesterolemia.  She's not had any episodes of chest pain or shortness of breath.  She's had her INR levels checked in our Coumadin clinic. Her INR levels have all been therapeutic.  Nov. 6 , 2014  Amanda Wiggins is doing well.  i saw her a year ago.  She is 10 years our from her breast cancer and is doing well.    No cardiac complaints.   Able to do all of her normal activities.     Nov. 13, 2015:  Amanda Wiggins is doing well.   No CP .  No dyspnea.   Has been stressful.  Her mother died this past year.   Her INR levels have been  Well controlled   Nov. 14, 2016:  Doing well.  No cardiac issues.  INR levels have been stable,  A bit low the last time .  Had an AVR 13 years ago .    June 05, 2015:  Amanda Wiggins was recently seen at the hospital for an acute anterior wall ST segment elevation myocardial infarction. She had stenting of her mid LAD. She had recurrent pain on the day of her original discharge and was taken back to the Cath Lab and had a second stent placed. She continued to have some intermittent episodes of chest discomfort. She has had persistent ST segment elevation. Her left ventricular systolic function is moderately depressed with an EF of 40-45%.   She has akinesis of the distal anterior wall and apex.   She is not having the band like chest pain that she had on presentation Has had some GERD.   Asked about going back on Omeprazole  - we discussed  her need for Protonix with the plavix .   September 01, 2015:  Amanda Wiggins is seen back for follow up visit. Still having trouble getting over her MI .  Still fatigued but is slowly getting better.   No CP.      Dec. 12, 2017:  feeling ok Does not have the energy that she wants ( and used to have prior to her MI )  BP has been in the 90-100 range.   September 21, 2016:    Amanda Wiggins is seen today for her AVR and CAD / MI  Her husband, Amanda Wiggins, died last week  Has some leg aches.    Current Outpatient Prescriptions on File Prior to Visit  Medication Sig Dispense Refill  . Alirocumab (PRALUENT) 75 MG/ML SOPN Inject 1 pen into the skin every 14 (fourteen) days. 2 pen 11  . ALPRAZolam (XANAX) 0.25 MG tablet Take 1 tablet (0.25 mg total) by mouth 2 (two) times daily as needed for anxiety. 60 tablet 1  . BIOTIN PO Take 1 tablet by mouth every morning.     . Calcium Carbonate-Vitamin D (CALCIUM 600+D) 600-400 MG-UNIT per tablet Take 1 tablet by mouth 2 (two) times daily.      . carvedilol (  COREG) 3.125 MG tablet Take 1 tablet (3.125 mg total) by mouth 2 (two) times daily with a meal. 60 tablet 11  . clopidogrel (PLAVIX) 75 MG tablet TAKE 1 TABLET (75 MG TOTAL) BY MOUTH DAILY WITH BREAKFAST. 30 tablet 7  . cycloSPORINE (RESTASIS) 0.05 % ophthalmic emulsion Place 1 drop into both eyes 2 (two) times daily as needed.     . Desoximetasone 0.05 % GEL Apply 1 application topically as needed (AS NEEDED TO AFFECTED AREA).     . Glucosamine-Chondroit-Vit C-Mn (GLUCOSAMINE CHONDR 1500 COMPLX) CAPS Take 1 capsule by mouth every morning.     . Influenza Vac Split High-Dose 0.5 ML SUSY Fluzone High-Dose 2015-16 (PF) 180 mcg/0.5 mL intramuscular syringe  TO BE ADMINISTERED BY PHARMACIST FOR IMMUNIZATION    . loratadine (CLARITIN) 10 MG tablet Take 10 mg by mouth daily.      . Multiple Vitamin (MULTIVITAMIN) capsule Take 1 capsule by mouth daily.      . nitroGLYCERIN (NITROSTAT) 0.4 MG SL tablet Place 1 tablet (0.4 mg total)  under the tongue every 5 (five) minutes as needed for chest pain. 25 tablet 2  . pantoprazole (PROTONIX) 40 MG tablet TAKE 1 TABLET (40 MG TOTAL) BY MOUTH DAILY. 30 tablet 5  . senna (SENOKOT) 8.6 MG tablet Take 2 tablets by mouth daily as needed for constipation.     Marland Kitchen SHINGRIX injection TO BE ADMINISTERED BY PHARMACIST FOR IMMUNIZATION  0  . warfarin (COUMADIN) 5 MG tablet TAKE AS DIRECTED BY COUMADIN CLINIC 90 tablet 1   Current Facility-Administered Medications on File Prior to Visit  Medication Dose Route Frequency Provider Last Rate Last Dose  . 0.9 %  sodium chloride infusion  500 mL Intravenous Continuous Gatha Mayer, MD        Allergies  Allergen Reactions  . Meperidine Nausea Only  . Sulfa Antibiotics Nausea Only  . Crestor [Rosuvastatin Calcium]     Myalgias on 10mg  and 20mg  daily  . Latex Hives  . Meperidine Hcl Nausea And Vomiting  . Pravastatin     myalgias  . Sulfamethoxazole Nausea And Vomiting    REACTION: unspecified    Past Medical History:  Diagnosis Date  . Aortic valve replaced   . Breast cancer ([place]) 2005  . FHx: migraine headaches   . GERD (gastroesophageal reflux disease)   . Hiatal hernia   . Hx Breast Cancer, IDC, Stage I 07/03/2003  . Hx of breast cancer   . Hyperlipidemia   . Hypertension   . Menopausal syndrome   . Osteopenia   . 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 Personal history of radiation therapy   . Status post aortic valve repair     Past Surgical History:  Procedure Laterality Date  . AORTIC VALVE REPLACEMENT  2003  . BREAST BIOPSY    . BREAST LUMPECTOMY Left 2005  . CARDIAC CATHETERIZATION     Ejection Fraction   . CARDIAC CATHETERIZATION N/A 05/16/2015   Procedure: Left Heart Cath and Coronary Angiography;  Surgeon: Leonie Man, MD;  Location: [facility];  Service: Cardiovascular;  Laterality: N/A;  . CARDIAC CATHETERIZATION  05/16/2015   Procedure: Coronary Stent Intervention;  Surgeon: Leonie Man, MD;  Location: [facility];  Service: Cardiovascular;;  . CARDIAC CATHETERIZATION N/A 05/19/2015   Procedure: Left Heart Cath and Coronary Angiography;  Surgeon: Troy Sine, MD;  Location: [facility];  Service: Cardiovascular;  Laterality: N/A;  .  CARDIAC CATHETERIZATION N/A 05/19/2015   Procedure: Coronary Stent Intervention;  Surgeon: Troy Sine, MD;  Location: [facility];  Service: Cardiovascular;  Laterality: N/A;  . HIATAL HERNIA REPAIR    . HIATAL HERNIA REPAIR  05/11/2007  . hysterectomy, endometiral cancer  2001  . lummpectomy breast cancer  07/03/2003  . nissan fundoplicaiton  [id number]   post-op hematoma on heparin   . right shouler surgery      History  Smoking Status  . Never Smoker  Smokeless Tobacco  . Never Used    History  Alcohol Use  . 0.0 oz/week    Comment: socially    Family History  Problem Relation Age of Onset  . Heart attack Mother 64  . Osteoarthritis Brother        hpercholesterolemia    Reviw of Systems:  Reviewed in the HPI.  All other systems are negative.  Physical Exam: BP 118/70   Pulse 64   Ht 5\' 1"  (1.549 m)   Wt 136 lb (61.7 kg)   BMI 25.70 kg/m  The patient is alert and oriented x 3.  The mood and affect are normal.   Skin: warm and dry.  Color is normal.    HEENT:   the sclera are nonicteric.  The mucous membranes are moist.  The carotids are 2+ without bruits.  There is no thyromegaly.  There is no JVD.    Lungs: clear.  The chest wall is non tender.    Heart: regular rate with a normal S1 and mechanical S2.  2/6 systolic murmur .  No  gallops, or rubs. The PMI is not displaced.     Abdomen: good bowel sounds.  There is no guarding or rebound.  There is no hepatosplenomegaly or tenderness.  There are no masses.   Extremities:  no clubbing, cyanosis, or edema.  The legs are without rashes.  The distal pulses are intact.   Neuro:  Cranial nerves II - XII are intact.  Motor and sensory  functions are intact.    The gait is normal.  ECG:  September 21, 2016:   NSR with PVCs TWI anteriorly .    Assessment / Plan:   1. Aortic valve replacement - continue current meds.    2. Hyperlipidemia - lipids levels today   3. Breast cancer - stable   4. CAD - is s/p stenting of her LAD x2.  Seems to be doing well at this point .  Seems to be still sluggish but is slowly regaining her strength.  Will DC the ASA.  Continue plavix and coumadin .   5. Chronic systolic CHF:   EF is 08-14%.    BP is too low to start ARB.    She has days where she feels very weak and lightheaded. She finds that drinking Gatorade seems to help. I have encouraged her to drink a small can of V8 juice on  the days that she feels poorly. We will get an echocardiogram to assess LV function     Mertie Moores, MD  09/21/2016 7:57 AM    [facility] [place],  [place], [place]  [address] Pager [phone] Phone: [phone]; Fax: [phone]

## 2016-09-21 NOTE — Patient Instructions (Signed)
Medication Instructions:  Your physician recommends that you continue on your current medications as directed. Please refer to the Current Medication list given to you today.   Labwork: TODAY - cholesterol, complete metabolic panel   Testing/Procedures: Your physician has requested that you have an echocardiogram. Echocardiography is a painless test that uses sound waves to create images of your heart. It provides your doctor with information about the size and shape of your heart and how well your heart's chambers and valves are working. This procedure takes approximately one hour. There are no restrictions for this procedure.   Follow-Up: Your physician wants you to follow-up in: 6 months with Dr. Acie Fredrickson.  You will receive a reminder letter in the mail two months in advance. If you don't receive a letter, please call our office to schedule the follow-up appointment.   If you need a refill on your cardiac medications before your next appointment, please call your pharmacy.   Thank you for choosing [facility]! Christen Bame, RN [phone]

## 2016-09-22 ENCOUNTER — Telehealth: Payer: Self-pay | Admitting: *Deleted

## 2016-09-22 NOTE — Telephone Encounter (Signed)
-----   Message from Thayer Headings, MD sent at 09/21/2016  5:51 PM EDT ----- Lipids are significantly improved.  Other labs are stable

## 2016-09-22 NOTE — Telephone Encounter (Signed)
Tried to call pt re: lab results, no answer, no machine.

## 2016-09-29 ENCOUNTER — Ambulatory Visit (HOSPITAL_COMMUNITY): Payer: Medicare Other | Attending: Cardiology

## 2016-09-29 ENCOUNTER — Other Ambulatory Visit: Payer: Self-pay

## 2016-09-29 DIAGNOSIS — Z952 Presence of prosthetic heart valve: Secondary | ICD-10-CM

## 2016-09-29 DIAGNOSIS — I5022 Chronic systolic (congestive) heart failure: Secondary | ICD-10-CM | POA: Insufficient documentation

## 2016-09-29 DIAGNOSIS — I071 Rheumatic tricuspid insufficiency: Secondary | ICD-10-CM | POA: Diagnosis not present

## 2016-09-29 DIAGNOSIS — I2510 Atherosclerotic heart disease of native coronary artery without angina pectoris: Secondary | ICD-10-CM | POA: Insufficient documentation

## 2016-09-29 MED ORDER — PERFLUTREN LIPID MICROSPHERE
1.0000 mL | INTRAVENOUS | Status: AC | PRN
  Administered 2016-09-29: 2 mL via INTRAVENOUS

## 2016-10-03 ENCOUNTER — Ambulatory Visit (INDEPENDENT_AMBULATORY_CARE_PROVIDER_SITE_OTHER): Payer: Medicare Other | Admitting: *Deleted

## 2016-10-03 DIAGNOSIS — Z7901 Long term (current) use of anticoagulants: Secondary | ICD-10-CM

## 2016-10-03 DIAGNOSIS — Z5181 Encounter for therapeutic drug level monitoring: Secondary | ICD-10-CM | POA: Diagnosis not present

## 2016-10-03 DIAGNOSIS — Z952 Presence of prosthetic heart valve: Secondary | ICD-10-CM

## 2016-10-03 DIAGNOSIS — I359 Nonrheumatic aortic valve disorder, unspecified: Secondary | ICD-10-CM | POA: Diagnosis not present

## 2016-10-03 LAB — POCT INR: INR: 2.2

## 2016-10-31 ENCOUNTER — Ambulatory Visit (INDEPENDENT_AMBULATORY_CARE_PROVIDER_SITE_OTHER): Payer: Medicare Other | Admitting: *Deleted

## 2016-10-31 DIAGNOSIS — Z952 Presence of prosthetic heart valve: Secondary | ICD-10-CM | POA: Diagnosis not present

## 2016-10-31 DIAGNOSIS — Z5181 Encounter for therapeutic drug level monitoring: Secondary | ICD-10-CM | POA: Diagnosis not present

## 2016-10-31 DIAGNOSIS — Z7901 Long term (current) use of anticoagulants: Secondary | ICD-10-CM | POA: Diagnosis not present

## 2016-10-31 DIAGNOSIS — I359 Nonrheumatic aortic valve disorder, unspecified: Secondary | ICD-10-CM | POA: Diagnosis not present

## 2016-10-31 LAB — POCT INR: INR: 1.9

## 2016-11-04 ENCOUNTER — Other Ambulatory Visit: Payer: Self-pay | Admitting: Cardiovascular Disease

## 2016-11-09 ENCOUNTER — Other Ambulatory Visit: Payer: Self-pay | Admitting: Cardiovascular Disease

## 2016-11-24 ENCOUNTER — Encounter: Payer: Self-pay | Admitting: Internal Medicine

## 2016-11-29 ENCOUNTER — Ambulatory Visit (INDEPENDENT_AMBULATORY_CARE_PROVIDER_SITE_OTHER): Payer: Medicare Other | Admitting: Pharmacist

## 2016-11-29 DIAGNOSIS — I359 Nonrheumatic aortic valve disorder, unspecified: Secondary | ICD-10-CM | POA: Diagnosis not present

## 2016-11-29 DIAGNOSIS — Z7901 Long term (current) use of anticoagulants: Secondary | ICD-10-CM

## 2016-11-29 DIAGNOSIS — Z952 Presence of prosthetic heart valve: Secondary | ICD-10-CM | POA: Diagnosis not present

## 2016-11-29 DIAGNOSIS — Z5181 Encounter for therapeutic drug level monitoring: Secondary | ICD-10-CM

## 2016-11-29 LAB — POCT INR: INR: 1.7

## 2016-12-09 ENCOUNTER — Telehealth: Payer: Self-pay | Admitting: *Deleted

## 2016-12-09 ENCOUNTER — Ambulatory Visit (INDEPENDENT_AMBULATORY_CARE_PROVIDER_SITE_OTHER): Payer: Medicare Other | Admitting: Pharmacist

## 2016-12-09 DIAGNOSIS — Z7901 Long term (current) use of anticoagulants: Secondary | ICD-10-CM | POA: Diagnosis not present

## 2016-12-09 DIAGNOSIS — Z5181 Encounter for therapeutic drug level monitoring: Secondary | ICD-10-CM | POA: Diagnosis not present

## 2016-12-09 DIAGNOSIS — Z952 Presence of prosthetic heart valve: Secondary | ICD-10-CM

## 2016-12-09 DIAGNOSIS — I359 Nonrheumatic aortic valve disorder, unspecified: Secondary | ICD-10-CM | POA: Diagnosis not present

## 2016-12-09 LAB — POCT INR: INR: 2.8

## 2016-12-09 NOTE — Telephone Encounter (Signed)
khadijah from [facility] called for patient needing to know if we have received the confirmation via fax for [employer] [facility]. Jerilee Hoh will be refaxing it

## 2016-12-30 ENCOUNTER — Ambulatory Visit (INDEPENDENT_AMBULATORY_CARE_PROVIDER_SITE_OTHER): Payer: Medicare Other | Admitting: *Deleted

## 2016-12-30 DIAGNOSIS — Z7901 Long term (current) use of anticoagulants: Secondary | ICD-10-CM

## 2016-12-30 DIAGNOSIS — I359 Nonrheumatic aortic valve disorder, unspecified: Secondary | ICD-10-CM | POA: Diagnosis not present

## 2016-12-30 DIAGNOSIS — Z5181 Encounter for therapeutic drug level monitoring: Secondary | ICD-10-CM | POA: Diagnosis not present

## 2016-12-30 DIAGNOSIS — Z952 Presence of prosthetic heart valve: Secondary | ICD-10-CM

## 2016-12-30 LAB — POCT INR: INR: 2.5

## 2017-01-01 ENCOUNTER — Other Ambulatory Visit: Payer: Self-pay | Admitting: Cardiovascular Disease

## 2017-01-16 ENCOUNTER — Encounter: Payer: Self-pay | Admitting: Internal Medicine

## 2017-01-16 ENCOUNTER — Ambulatory Visit: Payer: Medicare Other | Admitting: Internal Medicine

## 2017-01-16 VITALS — BP 120/72 | HR 71 | Temp 98.1°F | Ht 61.0 in | Wt 138.4 lb

## 2017-01-16 DIAGNOSIS — Z952 Presence of prosthetic heart valve: Secondary | ICD-10-CM

## 2017-01-16 DIAGNOSIS — E785 Hyperlipidemia, unspecified: Secondary | ICD-10-CM | POA: Diagnosis not present

## 2017-01-16 DIAGNOSIS — I5022 Chronic systolic (congestive) heart failure: Secondary | ICD-10-CM

## 2017-01-16 DIAGNOSIS — I255 Ischemic cardiomyopathy: Secondary | ICD-10-CM

## 2017-01-16 DIAGNOSIS — I10 Essential (primary) hypertension: Secondary | ICD-10-CM

## 2017-01-16 NOTE — Patient Instructions (Signed)
Limit your sodium (Salt) intake  Avoids foods high in acid such as tomatoes citrus juices, and spicy foods.  Avoid eating within two hours of lying down or before exercising.  Do not overheat.  Try smaller more frequent meals.     It is important that you exercise regularly, at least 20 minutes 3 to 4 times per week.  If you develop chest pain or shortness of breath seek  medical attention.  Return in 6 months for follow-up

## 2017-01-16 NOTE — Progress Notes (Signed)
Subjective:    Patient ID: Amanda Wiggins, female    DOB: 07/08/1945, 71 y.o.   MRN: [id number]  HPI 71 year old patient seen today for her six-month follow-up.  She is followed by cardiology with coronary artery disease status post stenting.  Status post aortic valve replacement on chronic Coumadin anticoagulation.  She has a history of ischemic cardia myopathy with recent echocardiogram revealing an ejection fraction of 40%.  She is doing quite well. She has a history of statin intolerance and presently is on praulent injections every 2 weeks. She is compliant with her INRs and there has been some warfarin adjustments made recently No new concerns or complaints  Past Medical History:  Diagnosis Date  . Aortic valve replaced   . Breast cancer ([place]) 2005  . FHx: migraine headaches   . GERD (gastroesophageal reflux disease)   . Hiatal hernia   . Hx Breast Cancer, IDC, Stage I 07/03/2003  . Hx of breast cancer   . Hyperlipidemia   . Hypertension   . Menopausal syndrome   . Osteopenia   . 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 Personal history of radiation therapy   . Status post aortic valve repair      Social History   Socioeconomic History  . Marital status: Married    Spouse name: Not on file  . Number of children: Not on file  . Years of education: Not on file  . Highest education level: Not on file  Social Needs  . Financial resource strain: Not on file  . Food insecurity - worry: Not on file  . Food insecurity - inability: Not on file  . Transportation needs - medical: Not on file  . Transportation needs - non-medical: Not on file  Occupational History  . Not on file  Tobacco Use  . Smoking status: Never Smoker  . Smokeless tobacco: Never Used  Substance and Sexual Activity  . Alcohol use: Yes    Alcohol/week: 0.0 oz    Comment: socially  . Drug use: No  . Sexual activity: Not on file  Other Topics Concern  . Not on file  Social History  Narrative  . Not on file    Past Surgical History:  Procedure Laterality Date  . AORTIC VALVE REPLACEMENT  2003  . BREAST BIOPSY    . BREAST LUMPECTOMY Left 2005  . CARDIAC CATHETERIZATION     Ejection Fraction   . HIATAL HERNIA REPAIR    . HIATAL HERNIA REPAIR  05/11/2007  . hysterectomy, endometiral cancer  2001  . lummpectomy breast cancer  07/03/2003  . nissan fundoplicaiton  [id number]   post-op hematoma on heparin   . right shouler surgery      Family History  Problem Relation Age of Onset  . Heart attack Mother 66  . Osteoarthritis Brother        hpercholesterolemia    Allergies  Allergen Reactions  . Meperidine Nausea Only  . Sulfa Antibiotics Nausea Only  . Crestor [Rosuvastatin Calcium]     Myalgias on 10mg  and 20mg  daily  . Latex Hives  . Meperidine Hcl Nausea And Vomiting  . Pravastatin     myalgias  . Sulfamethoxazole Nausea And Vomiting    REACTION: unspecified    Current Outpatient Medications on File Prior to Visit  Medication Sig Dispense Refill  . Alirocumab (PRALUENT) 75 MG/ML SOPN Inject 1 pen into the skin every 14 (fourteen) days. 2 pen 11  . ALPRAZolam (XANAX) 0.25 MG  tablet Take 1 tablet (0.25 mg total) by mouth 2 (two) times daily as needed for anxiety. 60 tablet 1  . BIOTIN PO Take 1 tablet by mouth every morning.     . Calcium Carbonate-Vitamin D (CALCIUM 600+D) 600-400 MG-UNIT per tablet Take 1 tablet by mouth 2 (two) times daily.      . carvedilol (COREG) 3.125 MG tablet TAKE 1 TABLET (3.125 MG TOTAL) BY MOUTH 2 (TWO) TIMES DAILY WITH A MEAL. 60 tablet 10  . clopidogrel (PLAVIX) 75 MG tablet TAKE 1 TABLET (75 MG TOTAL) BY MOUTH DAILY WITH BREAKFAST. 30 tablet 8  . cycloSPORINE (RESTASIS) 0.05 % ophthalmic emulsion Place 1 drop into both eyes 2 (two) times daily as needed.     . Desoximetasone 0.05 % GEL Apply 1 application topically as needed (AS NEEDED TO AFFECTED AREA).     . Glucosamine-Chondroit-Vit C-Mn (GLUCOSAMINE CHONDR 1500 COMPLX)  CAPS Take 1 capsule by mouth every morning.     . Influenza Vac Split High-Dose 0.5 ML SUSY Fluzone High-Dose 2015-16 (PF) 180 mcg/0.5 mL intramuscular syringe  TO BE ADMINISTERED BY PHARMACIST FOR IMMUNIZATION    . loratadine (CLARITIN) 10 MG tablet Take 10 mg by mouth daily.      . Multiple Vitamin (MULTIVITAMIN) capsule Take 1 capsule by mouth daily.      . nitroGLYCERIN (NITROSTAT) 0.4 MG SL tablet Place 1 tablet (0.4 mg total) under the tongue every 5 (five) minutes as needed for chest pain. 25 tablet 2  . pantoprazole (PROTONIX) 40 MG tablet TAKE 1 TABLET (40 MG TOTAL) BY MOUTH DAILY. 30 tablet 5  . senna (SENOKOT) 8.6 MG tablet Take 2 tablets by mouth daily as needed for constipation.     Marland Kitchen SHINGRIX injection TO BE ADMINISTERED BY PHARMACIST FOR IMMUNIZATION  0  . temazepam (RESTORIL) 30 MG capsule Take 30 mg by mouth at bedtime as needed for sleep.    Marland Kitchen warfarin (COUMADIN) 5 MG tablet TAKE AS DIRECTED BY COUMADIN CLINIC 90 tablet 1   Current Facility-Administered Medications on File Prior to Visit  Medication Dose Route Frequency Provider Last Rate Last Dose  . 0.9 %  sodium chloride infusion  500 mL Intravenous Continuous Gatha Mayer, MD        BP 120/72 (BP Location: Left Arm, Patient Position: Sitting, Cuff Size: Normal)   Pulse 71   Temp 98.1 F (36.7 C) (Oral)   Ht 5\' 1"  (1.549 m)   Wt 138 lb 6.4 oz (62.8 kg)   SpO2 93%   BMI 26.15 kg/m      Review of Systems  Constitutional: Negative.   HENT: Negative for congestion, dental problem, hearing loss, rhinorrhea, sinus pressure, sore throat and tinnitus.   Eyes: Negative for pain, discharge and visual disturbance.  Respiratory: Negative for cough and shortness of breath.   Cardiovascular: Negative for chest pain, palpitations and leg swelling.  Gastrointestinal: Negative for abdominal distention, abdominal pain, blood in stool, constipation, diarrhea, nausea and vomiting.  Genitourinary: Negative for difficulty  urinating, dysuria, flank pain, frequency, hematuria, pelvic pain, urgency, vaginal bleeding, vaginal discharge and vaginal pain.  Musculoskeletal: Positive for myalgias. Negative for arthralgias, gait problem and joint swelling.  Skin: Negative for rash.  Neurological: Negative for dizziness, syncope, speech difficulty, weakness, numbness and headaches.  Hematological: Negative for adenopathy.  Psychiatric/Behavioral: Negative for agitation, behavioral problems and dysphoric mood. The patient is not nervous/anxious.        Objective:   Physical Exam  Constitutional: She is  oriented to person, place, and time. She appears well-developed and well-nourished.  HENT:  Head: Normocephalic.  Right Ear: External ear normal.  Left Ear: External ear normal.  Mouth/Throat: Oropharynx is clear and moist.  Eyes: Conjunctivae and EOM are normal. Pupils are equal, round, and reactive to light.  Neck: Normal range of motion. Neck supple. No thyromegaly present.  Cardiovascular: Normal rate, regular rhythm and intact distal pulses.  Murmur heard. Prosthetic heart sounds  Pulmonary/Chest: Effort normal and breath sounds normal.  Abdominal: Soft. Bowel sounds are normal. She exhibits no mass. There is no tenderness.  Musculoskeletal: Normal range of motion.  Lymphadenopathy:    She has no cervical adenopathy.  Neurological: She is alert and oriented to person, place, and time.  Skin: Skin is warm and dry. No rash noted.  Psychiatric: She has a normal mood and affect. Her behavior is normal.          Assessment & Plan:   Coronary artery disease Status post aortic valve repair Dyslipidemia Statin intolerance Gastroesophageal reflux disease  No change in medical regimen Patient has had flu vaccine as well as shingles vaccine this year Recent gynecologic evaluation  Follow-up 6 months  Yu Cragun Pilar Plate

## 2017-01-17 ENCOUNTER — Other Ambulatory Visit: Payer: Self-pay | Admitting: Internal Medicine

## 2017-01-25 ENCOUNTER — Ambulatory Visit (INDEPENDENT_AMBULATORY_CARE_PROVIDER_SITE_OTHER): Payer: Medicare Other | Admitting: Pharmacist

## 2017-01-25 DIAGNOSIS — Z952 Presence of prosthetic heart valve: Secondary | ICD-10-CM | POA: Diagnosis not present

## 2017-01-25 DIAGNOSIS — Z5181 Encounter for therapeutic drug level monitoring: Secondary | ICD-10-CM

## 2017-01-25 DIAGNOSIS — I359 Nonrheumatic aortic valve disorder, unspecified: Secondary | ICD-10-CM | POA: Diagnosis not present

## 2017-01-25 DIAGNOSIS — Z7901 Long term (current) use of anticoagulants: Secondary | ICD-10-CM | POA: Diagnosis not present

## 2017-01-25 LAB — POCT INR: INR: 2.4

## 2017-01-25 NOTE — Patient Instructions (Signed)
Continue to take 1 tablet daily except 1/2 tablet on Thursdays. Recheck in 6 weeks. Coumadin Clinic [phone]

## 2017-02-04 ENCOUNTER — Other Ambulatory Visit: Payer: Self-pay | Admitting: Physician Assistant

## 2017-02-06 ENCOUNTER — Other Ambulatory Visit: Payer: Self-pay | Admitting: *Deleted

## 2017-02-06 MED ORDER — WARFARIN SODIUM 5 MG PO TABS: ORAL_TABLET | ORAL | 1 refills | Status: DC

## 2017-02-13 ENCOUNTER — Ambulatory Visit: Payer: Medicare Other | Admitting: Hematology & Oncology

## 2017-02-13 ENCOUNTER — Other Ambulatory Visit: Payer: Medicare Other

## 2017-02-27 ENCOUNTER — Other Ambulatory Visit: Payer: Self-pay

## 2017-02-27 ENCOUNTER — Encounter: Payer: Self-pay | Admitting: Hematology & Oncology

## 2017-02-27 ENCOUNTER — Other Ambulatory Visit: Payer: Medicare Other

## 2017-02-27 ENCOUNTER — Ambulatory Visit (HOSPITAL_BASED_OUTPATIENT_CLINIC_OR_DEPARTMENT_OTHER): Payer: Medicare Other | Admitting: Hematology & Oncology

## 2017-02-27 VITALS — BP 116/88 | HR 63 | Temp 98.0°F | Resp 16 | Wt 140.0 lb

## 2017-02-27 DIAGNOSIS — G4701 Insomnia due to medical condition: Secondary | ICD-10-CM

## 2017-02-27 DIAGNOSIS — Z853 Personal history of malignant neoplasm of breast: Secondary | ICD-10-CM | POA: Diagnosis not present

## 2017-02-27 DIAGNOSIS — Z7901 Long term (current) use of anticoagulants: Secondary | ICD-10-CM

## 2017-02-27 DIAGNOSIS — Z952 Presence of prosthetic heart valve: Secondary | ICD-10-CM

## 2017-02-27 DIAGNOSIS — C549 Malignant neoplasm of corpus uteri, unspecified: Secondary | ICD-10-CM

## 2017-02-27 LAB — CBC WITH DIFFERENTIAL (CANCER CENTER ONLY)
BASO#: 0 10*3/uL (ref 0.0–0.2)
BASO%: 0.2 % (ref 0.0–2.0)
EOS ABS: 0.1 10*3/uL (ref 0.0–0.5)
EOS%: 2.4 % (ref 0.0–7.0)
HCT: 42.5 % (ref 34.8–46.6)
HGB: 13.9 g/dL (ref 11.6–15.9)
LYMPH#: 1.2 10*3/uL (ref 0.9–3.3)
LYMPH%: 23.9 % (ref 14.0–48.0)
MCH: 29.1 pg (ref 26.0–34.0)
MCHC: 32.7 g/dL (ref 32.0–36.0)
MCV: 89 fL (ref 81–101)
MONO#: 0.4 10*3/uL (ref 0.1–0.9)
MONO%: 8.4 % (ref 0.0–13.0)
NEUT#: 3.2 10*3/uL (ref 1.5–6.5)
NEUT%: 65.1 % (ref 39.6–80.0)
Platelets: 295 10*3/uL (ref 145–400)
RBC: 4.78 10*6/uL (ref 3.70–5.32)
RDW: 13.5 % (ref 11.1–15.7)
WBC: 5 10*3/uL (ref 3.9–10.0)

## 2017-02-27 LAB — CMP (CANCER CENTER ONLY)
ALT(SGPT): 22 U/L (ref 10–47)
AST: 26 U/L (ref 11–38)
Albumin: 3.7 g/dL (ref 3.3–5.5)
Alkaline Phosphatase: 55 U/L (ref 26–84)
BUN: 14 mg/dL (ref 7–22)
CHLORIDE: 106 meq/L (ref 98–108)
CO2: 30 meq/L (ref 18–33)
CREATININE: 0.9 mg/dL (ref 0.6–1.2)
Calcium: 9.2 mg/dL (ref 8.0–10.3)
GLUCOSE: 93 mg/dL (ref 73–118)
Potassium: 4.3 mEq/L (ref 3.3–4.7)
SODIUM: 147 meq/L — AB (ref 128–145)
Total Bilirubin: 0.7 mg/dl (ref 0.20–1.60)
Total Protein: 6.8 g/dL (ref 6.4–8.1)

## 2017-02-27 LAB — PROTIME-INR (CHCC SATELLITE)
INR: 2.5 (ref 2.0–3.5)
Protime: 30 Seconds — ABNORMAL HIGH (ref 10.6–13.4)

## 2017-02-27 MED ORDER — TEMAZEPAM 30 MG PO CAPS: 30.0000 mg | ORAL_CAPSULE | Freq: Every evening | ORAL | 0 refills | Status: DC | PRN

## 2017-02-27 NOTE — Progress Notes (Signed)
Hematology and Oncology Follow Up Visit  Amanda Wiggins [id number] Mar 08, 1945 71 y.o. 02/27/2017   Principle Diagnosis:  1. Stage I (T1 N0 M0) ductal carcinoma of the left breast. 2. Mechanical aortic valve.  Current Therapy:    Coumadin-lifelong  Zometa 5 mg IV q. Year - given in June 2019     Interim History:  Ms.  Crady is back for followup. She is doing okay.   Unfortunately, her husband passed away a few months ago.  She was just across the street feeding a cat of 1 of her neighbors.  When she came back, her husband had passed on.  He had a fantastic day from what she said.  He was with some relatives.  They went around [place].  They had a nice lunch.  She turned 71 years old a month ago.  She really had a good birthday.  She is going to go on a cruise in February to the [place].  She does feel a little tired.  This might be from the Coreg that she is taking.  She has had no cough or shortness of breath.  She has had no change in bowel or bladder habits.  She had no cardiac issues.  She is on Coumadin for a prosthetic aortic valve.  She has had some bruising.  There is been no infections.  Overall, her performance status is ECOG 0.   Medications:  Current Outpatient Medications:  .  Alirocumab (PRALUENT) 75 MG/ML SOPN, Inject 1 pen into the skin every 14 (fourteen) days., Disp: 2 pen, Rfl: 11 .  ALPRAZolam (XANAX) 0.25 MG tablet, Take 1 tablet (0.25 mg total) by mouth 2 (two) times daily as needed for anxiety., Disp: 60 tablet, Rfl: 1 .  BIOTIN PO, Take 1 tablet by mouth every morning. , Disp: , Rfl:  .  Calcium Carbonate-Vitamin D (CALCIUM 600+D) 600-400 MG-UNIT per tablet, Take 1 tablet by mouth 2 (two) times daily.  , Disp: , Rfl:  .  carvedilol (COREG) 3.125 MG tablet, TAKE 1 TABLET (3.125 MG TOTAL) BY MOUTH 2 (TWO) TIMES DAILY WITH A MEAL., Disp: 60 tablet, Rfl: 10 .  clopidogrel (PLAVIX) 75 MG tablet, TAKE 1 TABLET (75 MG TOTAL) BY MOUTH DAILY WITH  BREAKFAST., Disp: 30 tablet, Rfl: 8 .  cycloSPORINE (RESTASIS) 0.05 % ophthalmic emulsion, Place 1 drop into both eyes 2 (two) times daily as needed. , Disp: , Rfl:  .  Desoximetasone 0.05 % GEL, Apply 1 application topically as needed (AS NEEDED TO AFFECTED AREA). , Disp: , Rfl:  .  Glucosamine-Chondroit-Vit C-Mn (GLUCOSAMINE CHONDR 1500 COMPLX) CAPS, Take 1 capsule by mouth every morning. , Disp: , Rfl:  .  Influenza Vac Split High-Dose 0.5 ML SUSY, Fluzone High-Dose 2015-16 (PF) 180 mcg/0.5 mL intramuscular syringe  TO BE ADMINISTERED BY PHARMACIST FOR IMMUNIZATION, Disp: , Rfl:  .  loratadine (CLARITIN) 10 MG tablet, Take 10 mg by mouth daily.  , Disp: , Rfl:  .  Multiple Vitamin (MULTIVITAMIN) capsule, Take 1 capsule by mouth daily.  , Disp: , Rfl:  .  nitroGLYCERIN (NITROSTAT) 0.4 MG SL tablet, Place 1 tablet (0.4 mg total) under the tongue every 5 (five) minutes as needed for chest pain., Disp: 25 tablet, Rfl: 2 .  pantoprazole (PROTONIX) 40 MG tablet, TAKE 1 TABLET BY MOUTH EVERY DAY, Disp: 30 tablet, Rfl: 3 .  PAZEO 0.7 % SOLN, INSTILL 1 DROP INTO BOTH EYES DAILY AS NEEDED, Disp: , Rfl: 5 .  senna (  SENOKOT) 8.6 MG tablet, Take 2 tablets by mouth daily as needed for constipation. , Disp: , Rfl:  .  SHINGRIX injection, TO BE ADMINISTERED BY PHARMACIST FOR IMMUNIZATION, Disp: , Rfl: 0 .  temazepam (RESTORIL) 30 MG capsule, Take 30 mg by mouth at bedtime as needed for sleep., Disp: , Rfl:  .  warfarin (COUMADIN) 5 MG tablet, TAKE AS DIRECTED BY COUMADIN CLINIC, Disp: 90 tablet, Rfl: 1  Current Facility-Administered Medications:  .  0.9 %  sodium chloride infusion, 500 mL, Intravenous, Continuous, Gatha Mayer, MD  Allergies:  Allergies  Allergen Reactions  . Meperidine Nausea Only  . Sulfa Antibiotics Nausea Only  . Crestor [Rosuvastatin Calcium]     Myalgias on 10mg  and 20mg  daily  . Latex Hives  . Meperidine Hcl Nausea And Vomiting  . Pravastatin     myalgias  .  Sulfamethoxazole Nausea And Vomiting    REACTION: unspecified    Past Medical History, Surgical history, Social history, and Family History were reviewed and updated.  Review of Systems: As stated in the interim history  Physical Exam:  weight is 140 lb (63.5 kg). Her oral temperature is 98 F (36.7 C). Her blood pressure is 116/88 and her pulse is 63. Her respiration is 16 and oxygen saturation is 99%.   Physical Exam  Constitutional: She is oriented to person, place, and time.  HENT:  Head: Normocephalic and atraumatic.  Mouth/Throat: Oropharynx is clear and moist.  Eyes: EOM are normal. Pupils are equal, round, and reactive to light.  Neck: Normal range of motion.  Cardiovascular: Normal rate, regular rhythm and normal heart sounds.  Pulmonary/Chest: Effort normal and breath sounds normal.  Abdominal: Soft. Bowel sounds are normal.  Musculoskeletal: Normal range of motion. She exhibits no edema, tenderness or deformity.  Lymphadenopathy:    She has no cervical adenopathy.  Neurological: She is alert and oriented to person, place, and time.  Skin: Skin is warm and dry. No rash noted. No erythema.  Psychiatric: She has a normal mood and affect. Her behavior is normal. Judgment and thought content normal.  Vitals reviewed.   Lab Results  Component Value Date   WBC 5.0 02/27/2017   HGB 13.9 02/27/2017   HCT 42.5 02/27/2017   MCV 89 02/27/2017   PLT 295 02/27/2017     Chemistry      Component Value Date/Time   NA 142 09/21/2016 0826   NA 141 08/15/2016 0931   NA 142 02/12/2015 1125   K 4.1 09/21/2016 0826   K 4.1 08/15/2016 0931   K 3.8 02/12/2015 1125   CL 104 09/21/2016 0826   CL 109 (H) 08/15/2016 0931   CO2 23 09/21/2016 0826   CO2 29 08/15/2016 0931   CO2 24 02/12/2015 1125   BUN 15 09/21/2016 0826   BUN 16 08/15/2016 0931   BUN 14.7 02/12/2015 1125   CREATININE 0.93 09/21/2016 0826   CREATININE 1.0 08/15/2016 0931   CREATININE 1.0 02/12/2015 1125       Component Value Date/Time   CALCIUM 9.0 09/21/2016 0826   CALCIUM 9.2 08/15/2016 0931   CALCIUM 10.2 02/12/2015 1125   ALKPHOS 54 09/21/2016 0826   ALKPHOS 49 08/15/2016 0931   ALKPHOS 56 02/12/2015 1125   AST 25 09/21/2016 0826   AST 32 08/15/2016 0931   AST 35 (H) 02/12/2015 1125   ALT 17 09/21/2016 0826   ALT 9 (L) 08/15/2016 0931   ALT 39 02/12/2015 1125   BILITOT 0.4 09/21/2016  9872   BILITOT 0.60 08/15/2016 0931   BILITOT 0.45 02/12/2015 1125       Impression and Plan: Ms. Mcelhaney is 31-year  old female with a history of stage I infiltrating duct carcinoma the left breast. She underwent lumpectomy.  She  had been on Femara. She had radiation. It has been 11  years now.  Everything looks good right now.  I do not see any problems from a malignant point of view.  I do not see any evidence of recurrent breast cancer.  We will go ahead and plan to get her back in 6 months.  We see her back, we will give her Zometa.   Volanda Napoleon, MD 12/24/201810:20 AM

## 2017-03-01 ENCOUNTER — Ambulatory Visit (INDEPENDENT_AMBULATORY_CARE_PROVIDER_SITE_OTHER): Payer: Medicare Other | Admitting: Internal Medicine

## 2017-03-01 DIAGNOSIS — Z5181 Encounter for therapeutic drug level monitoring: Secondary | ICD-10-CM | POA: Diagnosis not present

## 2017-03-01 DIAGNOSIS — Z952 Presence of prosthetic heart valve: Secondary | ICD-10-CM | POA: Diagnosis not present

## 2017-03-01 NOTE — Patient Instructions (Signed)
Description   Continue to take 1 tablet daily except 1/2 tablet on Thursdays. Recheck in 6 weeks. Coumadin Clinic [phone]

## 2017-03-16 DIAGNOSIS — R42 Dizziness and giddiness: Secondary | ICD-10-CM | POA: Insufficient documentation

## 2017-03-16 DIAGNOSIS — H6123 Impacted cerumen, bilateral: Secondary | ICD-10-CM | POA: Insufficient documentation

## 2017-04-07 ENCOUNTER — Ambulatory Visit (INDEPENDENT_AMBULATORY_CARE_PROVIDER_SITE_OTHER): Payer: Medicare Other | Admitting: *Deleted

## 2017-04-07 DIAGNOSIS — Z952 Presence of prosthetic heart valve: Secondary | ICD-10-CM | POA: Diagnosis not present

## 2017-04-07 DIAGNOSIS — Z5181 Encounter for therapeutic drug level monitoring: Secondary | ICD-10-CM

## 2017-04-07 LAB — POCT INR: INR: 2.6

## 2017-04-07 MED ORDER — ALIROCUMAB 75 MG/ML ~~LOC~~ SOPN: 1.0000 "pen " | PEN_INJECTOR | SUBCUTANEOUS | 11 refills | Status: DC

## 2017-04-07 NOTE — Patient Instructions (Signed)
Description   Continue to take 1 tablet daily except 1/2 tablet on Thursdays. Recheck in 6 weeks. Coumadin Clinic [phone]

## 2017-04-11 ENCOUNTER — Ambulatory Visit: Payer: Medicare Other | Admitting: Cardiovascular Disease

## 2017-04-19 ENCOUNTER — Encounter: Payer: Self-pay | Admitting: Cardiovascular Disease

## 2017-04-19 ENCOUNTER — Ambulatory Visit: Payer: Medicare Other | Admitting: Cardiovascular Disease

## 2017-04-19 VITALS — BP 112/90 | HR 64 | Ht 61.0 in | Wt 140.8 lb

## 2017-04-19 DIAGNOSIS — Z952 Presence of prosthetic heart valve: Secondary | ICD-10-CM | POA: Diagnosis not present

## 2017-04-19 DIAGNOSIS — I2510 Atherosclerotic heart disease of native coronary artery without angina pectoris: Secondary | ICD-10-CM | POA: Diagnosis not present

## 2017-04-19 DIAGNOSIS — E782 Mixed hyperlipidemia: Secondary | ICD-10-CM | POA: Diagnosis not present

## 2017-04-19 LAB — BASIC METABOLIC PANEL
BUN / CREAT RATIO: 17 (ref 12–28)
BUN: 16 mg/dL (ref 8–27)
CALCIUM: 9.8 mg/dL (ref 8.7–10.3)
CO2: 26 mmol/L (ref 20–29)
CREATININE: 0.94 mg/dL (ref 0.57–1.00)
Chloride: 102 mmol/L (ref 96–106)
GFR, EST AFRICAN AMERICAN: 71 mL/min/{1.73_m2} (ref >59)
GFR, EST NON AFRICAN AMERICAN: 62 mL/min/{1.73_m2} (ref >59)
Glucose: 116 mg/dL — ABNORMAL HIGH (ref 65–99)
Potassium: 4.8 mmol/L (ref 3.5–5.2)
Sodium: 142 mmol/L (ref 134–144)

## 2017-04-19 LAB — HEPATIC FUNCTION PANEL
ALK PHOS: 55 IU/L (ref 39–117)
ALT: 21 IU/L (ref 0–32)
AST: 29 IU/L (ref 0–40)
Albumin: 4.6 g/dL (ref 3.5–4.8)
BILIRUBIN TOTAL: 0.3 mg/dL (ref 0.0–1.2)
BILIRUBIN, DIRECT: 0.1 mg/dL (ref 0.00–0.40)
Total Protein: 6.8 g/dL (ref 6.0–8.5)

## 2017-04-19 LAB — LIPID PANEL
CHOLESTEROL TOTAL: 161 mg/dL (ref 100–199)
Chol/HDL Ratio: 2.6 ratio (ref 0.0–4.4)
HDL: 61 mg/dL (ref >39)
LDL Calculated: 74 mg/dL (ref 0–99)
Triglycerides: 130 mg/dL (ref 0–149)
VLDL Cholesterol Cal: 26 mg/dL (ref 5–40)

## 2017-04-19 NOTE — Patient Instructions (Signed)
Medication Instructions:  Your physician recommends that you continue on your current medications as directed. Please refer to the Current Medication list given to you today.   Labwork: TODAY - cholesterol, liver panel, basic metabolic panel   Testing/Procedures: None Ordered   Follow-Up: Your physician wants you to follow-up in: 6 months with Dr. Nahser. You will receive a reminder letter in the mail two months in advance. If you don't receive a letter, please call our office to schedule the follow-up appointment.   If you need a refill on your cardiac medications before your next appointment, please call your pharmacy.   Thank you for choosing [facility]! Byan Poplaski, RN [phone]    

## 2017-04-19 NOTE — Progress Notes (Signed)
Amanda Wiggins Date of Birth  1945/09/21 [facility] [address]    [place], [place]  [address] [phone]  Fax  [phone]  Problem List 1. Aortic valve replacement 2. Hyperlipidemia 3. Breast cancer 4. CAD -  1. Mid LAD to Dist LAD lesion, 100% stenosed. Post intervention with a single Synergy DES 2.25 mm x 20 mm S/p mini vision stent to distal LAD   History of Present Illness:  Amanda Wiggins is a 72 year old female with a history of aortic stenosis-status post aortic valve replacement.  She also has a history of hypercholesterolemia.  She's not had any episodes of chest pain or shortness of breath.  She's had her INR levels checked in our Coumadin clinic. Her INR levels have all been therapeutic.  Nov. 6 , 2014  Amanda Wiggins is doing well.  i saw her a year ago.  She is 10 years our from her breast cancer and is doing well.    No cardiac complaints.   Able to do all of her normal activities.     Nov. 13, 2015:  Amanda Wiggins is doing well.   No CP .  No dyspnea.   Has been stressful.  Her mother died this past year.   Her INR levels have been  Well controlled   Nov. 14, 2016:  Doing well.  No cardiac issues.  INR levels have been stable,  A bit low the last time .  Had an AVR 13 years ago .    June 05, 2015:  Amanda Wiggins was recently seen at the hospital for an acute anterior wall ST segment elevation myocardial infarction. She had stenting of her mid LAD. She had recurrent pain on the day of her original discharge and was taken back to the Cath Lab and had a second stent placed. She continued to have some intermittent episodes of chest discomfort. She has had persistent ST segment elevation. Her left ventricular systolic function is moderately depressed with an EF of 40-45%.   She has akinesis of the distal anterior wall and apex.   She is not having the band like chest pain that she had on presentation Has had some GERD.   Asked about going back on Omeprazole  - we discussed  her need for Protonix with the plavix .   September 01, 2015:  Amanda Wiggins is seen back for follow up visit. Still having trouble getting over her MI .  Still fatigued but is slowly getting better.   No CP.      Dec. 12, 2017:  feeling ok Does not have the energy that she wants ( and used to have prior to her MI )  BP has been in the 90-100 range.   September 21, 2016:    Amanda Wiggins is seen today for her AVR and CAD / MI  Her husband, Amanda Wiggins, died last week  Has some leg aches.   Feb. 13, 2019: Amanda Wiggins is seen back today for follow up of her AVR and CHF Echo shows LV EF of 40% , mean AV gradient is 21 mmHg.  Is on coumadin and plavix  Has occasional left shoulder pain  Had stenting of LAD in March 2017.    Has had some balance issues.   Saw ENT. Thinks it may be orthostatic hypotension  Had ears cleaned out.   Feels better   Current Outpatient Medications on File Prior to Visit  Medication Sig Dispense Refill  . Alirocumab (PRALUENT) 75 MG/ML SOPN Inject 1 pen  into the skin every 14 (fourteen) days. 2 pen 11  . BIOTIN PO Take 1 tablet by mouth every morning.     . Calcium Carbonate-Vitamin D (CALCIUM 600+D) 600-400 MG-UNIT per tablet Take 1 tablet by mouth 2 (two) times daily.      . carvedilol (COREG) 3.125 MG tablet TAKE 1 TABLET (3.125 MG TOTAL) BY MOUTH 2 (TWO) TIMES DAILY WITH A MEAL. 60 tablet 10  . clopidogrel (PLAVIX) 75 MG tablet TAKE 1 TABLET (75 MG TOTAL) BY MOUTH DAILY WITH BREAKFAST. 30 tablet 8  . cycloSPORINE (RESTASIS) 0.05 % ophthalmic emulsion Place 1 drop into both eyes 2 (two) times daily as needed (DRY EYE).     . Desoximetasone 0.05 % GEL Apply 1 application topically as needed (AS NEEDED TO AFFECTED AREA).     . Glucosamine-Chondroit-Vit C-Mn (GLUCOSAMINE CHONDR 1500 COMPLX) CAPS Take 1 capsule by mouth every morning.     . Influenza Vac Split High-Dose 0.5 ML SUSY Fluzone High-Dose 2015-16 (PF) 180 mcg/0.5 mL intramuscular syringe  TO BE ADMINISTERED BY PHARMACIST FOR  IMMUNIZATION    . loratadine (CLARITIN) 10 MG tablet Take 10 mg by mouth daily.      . Multiple Vitamin (MULTIVITAMIN) capsule Take 1 capsule by mouth daily.      . nitroGLYCERIN (NITROSTAT) 0.4 MG SL tablet Place 1 tablet (0.4 mg total) under the tongue every 5 (five) minutes as needed for chest pain. 25 tablet 2  . pantoprazole (PROTONIX) 40 MG tablet TAKE 1 TABLET BY MOUTH EVERY DAY 30 tablet 3  . PAZEO 0.7 % SOLN INSTILL 1 DROP INTO BOTH EYES DAILY AS NEEDED FOR ALLERGIES  5  . senna (SENOKOT) 8.6 MG tablet Take 2 tablets by mouth daily as needed for constipation.     Marland Kitchen SHINGRIX injection TO BE ADMINISTERED BY PHARMACIST FOR IMMUNIZATION  0  . temazepam (RESTORIL) 30 MG capsule Take 1 capsule (30 mg total) by mouth at bedtime as needed for sleep. 30 capsule 0  . warfarin (COUMADIN) 5 MG tablet TAKE AS DIRECTED BY COUMADIN CLINIC 90 tablet 1   Current Facility-Administered Medications on File Prior to Visit  Medication Dose Route Frequency Provider Last Rate Last Dose  . 0.9 %  sodium chloride infusion  500 mL Intravenous Continuous Gatha Mayer, MD        Allergies  Allergen Reactions  . Meperidine Nausea Only  . Sulfa Antibiotics Nausea Only  . Crestor [Rosuvastatin Calcium]     Myalgias on 10mg  and 20mg  daily  . Latex Hives  . Meperidine Hcl Nausea And Vomiting  . Pravastatin     myalgias  . Sulfamethoxazole Nausea And Vomiting    REACTION: unspecified    Past Medical History:  Diagnosis Date  . Aortic valve replaced   . Breast cancer ([place]) 2005  . FHx: migraine headaches   . GERD (gastroesophageal reflux disease)   . Hiatal hernia   . Hx Breast Cancer, IDC, Stage I 07/03/2003  . Hx of breast cancer   . Hyperlipidemia   . Hypertension   . Menopausal syndrome   . Osteopenia   . 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 Personal history of radiation therapy   . Status post aortic valve repair     Past Surgical History:  Procedure Laterality Date  .  AORTIC VALVE REPLACEMENT  2003  . BREAST BIOPSY    . BREAST LUMPECTOMY Left 2005  . CARDIAC CATHETERIZATION     Ejection Fraction   .  CARDIAC CATHETERIZATION N/A 05/16/2015   Procedure: Left Heart Cath and Coronary Angiography;  Surgeon: Leonie Man, MD;  Location: [facility];  Service: Cardiovascular;  Laterality: N/A;  . CARDIAC CATHETERIZATION  05/16/2015   Procedure: Coronary Stent Intervention;  Surgeon: Leonie Man, MD;  Location: [facility];  Service: Cardiovascular;;  . CARDIAC CATHETERIZATION N/A 05/19/2015   Procedure: Left Heart Cath and Coronary Angiography;  Surgeon: Troy Sine, MD;  Location: [facility];  Service: Cardiovascular;  Laterality: N/A;  . CARDIAC CATHETERIZATION N/A 05/19/2015   Procedure: Coronary Stent Intervention;  Surgeon: Troy Sine, MD;  Location: [facility];  Service: Cardiovascular;  Laterality: N/A;  . HIATAL HERNIA REPAIR    . HIATAL HERNIA REPAIR  05/11/2007  . hysterectomy, endometiral cancer  2001  . lummpectomy breast cancer  07/03/2003  . nissan fundoplicaiton  [id number]   post-op hematoma on heparin   . right shouler surgery      Social History   Tobacco Use  Smoking Status Never Smoker  Smokeless Tobacco Never Used    Social History   Substance and Sexual Activity  Alcohol Use Yes  . Alcohol/week: 0.0 oz   Comment: socially    Family History  Problem Relation Age of Onset  . Heart attack Mother 63  . Osteoarthritis Brother        hpercholesterolemia    Reviw of Systems:  Reviewed in the HPI.  All other systems are negative.  Physical Exam: Blood pressure 112/90, pulse 64, height 5\' 1"  (1.549 m), weight 140 lb 12.8 oz (63.9 kg).  GEN:  Well nourished, well developed in no acute distress HEENT: Normal NECK: No JVD; No carotid bruits LYMPHATICS: No lymphadenopathy CARDIAC: RR, normal S1, mechanical S2,  2/6 systolic murmur  RESPIRATORY:  Clear to auscultation without rales, wheezing  or rhonchi  ABDOMEN: Soft, non-tender, non-distended MUSCULOSKELETAL:  No edema; No deformity  SKIN: Warm and dry NEUROLOGIC:  Alert and oriented x 3   ECG: April 19, 2017: Sinus bradycardia at 58.  Voltage criteria for left ventricular hypertrophy..    Assessment / Plan:   1. Aortic valve replacement -   Has a mechanical AVR.   Mean gradient of 21 mm.   Crisp valve sounds   2. Hyperlipidemia -  On Pralulent,  Will check labs today   3. Breast cancer -  Stable   4. CAD - is s/p stenting of her LAD x2.  She is not having any angina.  She does have occasional episodes of left shoulder pain.  These do not feel like her previous episodes of angina.  I encouraged her to try taking a nitroglycerin if she suspects that this is an anginal-like pain.   5. Chronic systolic CHF:   EF is 34-74%.    She seems to be stable.  She avoids salt for the most part.  She did have some leg swelling when she got back from a cruise several months ago.  No significant edema now.     Mertie Moores, MD  04/19/2017 9:27 AM    [address],  [place], [place]  [address] Pager [phone] Phone: [phone]; Fax: [phone]

## 2017-04-22 ENCOUNTER — Other Ambulatory Visit: Payer: Self-pay | Admitting: Hematology & Oncology

## 2017-04-24 ENCOUNTER — Other Ambulatory Visit: Payer: Self-pay | Admitting: Hematology & Oncology

## 2017-04-24 ENCOUNTER — Other Ambulatory Visit: Payer: Self-pay | Admitting: *Deleted

## 2017-04-24 DIAGNOSIS — N6320 Unspecified lump in the left breast, unspecified quadrant: Secondary | ICD-10-CM

## 2017-04-24 MED ORDER — TEMAZEPAM 30 MG PO CAPS: 30.0000 mg | ORAL_CAPSULE | Freq: Every evening | ORAL | 0 refills | Status: DC | PRN

## 2017-04-25 ENCOUNTER — Other Ambulatory Visit: Payer: Self-pay | Admitting: Hematology & Oncology

## 2017-04-25 DIAGNOSIS — Z853 Personal history of malignant neoplasm of breast: Secondary | ICD-10-CM

## 2017-04-28 ENCOUNTER — Ambulatory Visit
Admission: RE | Admit: 2017-04-28 | Discharge: 2017-04-28 | Disposition: A | Discharge status: Home / Self Care | Payer: Medicare Other | Source: Ambulatory Visit | Attending: Hematology & Oncology | Admitting: Hematology & Oncology

## 2017-04-28 DIAGNOSIS — N6320 Unspecified lump in the left breast, unspecified quadrant: Secondary | ICD-10-CM

## 2017-05-05 ENCOUNTER — Other Ambulatory Visit: Payer: Self-pay | Admitting: Physician Assistant

## 2017-05-19 ENCOUNTER — Ambulatory Visit: Payer: Medicare Other | Admitting: *Deleted

## 2017-05-19 DIAGNOSIS — Z952 Presence of prosthetic heart valve: Secondary | ICD-10-CM | POA: Diagnosis not present

## 2017-05-19 DIAGNOSIS — Z5181 Encounter for therapeutic drug level monitoring: Secondary | ICD-10-CM

## 2017-05-19 LAB — POCT INR: INR: 5

## 2017-05-19 NOTE — Patient Instructions (Signed)
Description   Skip today and tomorrow's dose, then continue to take 1 tablet daily except 1/2 tablet on Thursdays. Recheck in 1 week. Coumadin Clinic [phone]

## 2017-05-26 ENCOUNTER — Ambulatory Visit: Payer: Medicare Other | Admitting: Pharmacist

## 2017-05-26 DIAGNOSIS — Z5181 Encounter for therapeutic drug level monitoring: Secondary | ICD-10-CM | POA: Diagnosis not present

## 2017-05-26 DIAGNOSIS — Z952 Presence of prosthetic heart valve: Secondary | ICD-10-CM

## 2017-05-26 LAB — POCT INR: INR: 2.2

## 2017-05-26 NOTE — Patient Instructions (Signed)
Description   Continue to take 1 tablet daily except 1/2 tablet on Thursdays. Recheck in 4 weeks. Coumadin Clinic [phone]

## 2017-06-05 ENCOUNTER — Other Ambulatory Visit: Payer: Self-pay | Admitting: Obstetrics

## 2017-06-05 DIAGNOSIS — Z139 Encounter for screening, unspecified: Secondary | ICD-10-CM

## 2017-06-18 ENCOUNTER — Other Ambulatory Visit: Payer: Self-pay | Admitting: Hematology & Oncology

## 2017-06-19 ENCOUNTER — Other Ambulatory Visit: Payer: Self-pay | Admitting: *Deleted

## 2017-06-19 MED ORDER — TEMAZEPAM 30 MG PO CAPS: 30.0000 mg | ORAL_CAPSULE | Freq: Every evening | ORAL | 0 refills | Status: DC | PRN

## 2017-06-23 ENCOUNTER — Ambulatory Visit: Payer: Medicare Other | Admitting: Pharmacist

## 2017-06-23 DIAGNOSIS — Z952 Presence of prosthetic heart valve: Secondary | ICD-10-CM | POA: Diagnosis not present

## 2017-06-23 DIAGNOSIS — Z5181 Encounter for therapeutic drug level monitoring: Secondary | ICD-10-CM | POA: Diagnosis not present

## 2017-06-23 LAB — POCT INR: INR: 3.1

## 2017-06-23 NOTE — Patient Instructions (Signed)
Description   Take 1/2 tablet today, then continue to take 1 tablet daily except 1/2 tablet on Thursdays. Recheck in 4 weeks. Coumadin Clinic [phone]

## 2017-06-30 ENCOUNTER — Ambulatory Visit
Admission: RE | Admit: 2017-06-30 | Discharge: 2017-06-30 | Disposition: A | Discharge status: Home / Self Care | Payer: Medicare Other | Source: Ambulatory Visit | Attending: Obstetrics | Admitting: Obstetrics

## 2017-06-30 DIAGNOSIS — Z139 Encounter for screening, unspecified: Secondary | ICD-10-CM

## 2017-07-17 ENCOUNTER — Ambulatory Visit (INDEPENDENT_AMBULATORY_CARE_PROVIDER_SITE_OTHER): Payer: Medicare Other | Admitting: Internal Medicine

## 2017-07-17 ENCOUNTER — Encounter: Payer: Self-pay | Admitting: Internal Medicine

## 2017-07-17 VITALS — BP 120/64 | HR 74 | Temp 98.5°F | Ht 62.0 in | Wt 137.0 lb

## 2017-07-17 DIAGNOSIS — R7302 Impaired glucose tolerance (oral): Secondary | ICD-10-CM | POA: Diagnosis not present

## 2017-07-17 DIAGNOSIS — I10 Essential (primary) hypertension: Secondary | ICD-10-CM

## 2017-07-17 DIAGNOSIS — I5022 Chronic systolic (congestive) heart failure: Secondary | ICD-10-CM

## 2017-07-17 DIAGNOSIS — Z952 Presence of prosthetic heart valve: Secondary | ICD-10-CM | POA: Diagnosis not present

## 2017-07-17 DIAGNOSIS — E785 Hyperlipidemia, unspecified: Secondary | ICD-10-CM | POA: Diagnosis not present

## 2017-07-17 DIAGNOSIS — Z0000 Encounter for general adult medical examination without abnormal findings: Secondary | ICD-10-CM | POA: Diagnosis not present

## 2017-07-17 LAB — TSH: TSH: 3.09 u[IU]/mL (ref 0.35–4.50)

## 2017-07-17 LAB — HEMOGLOBIN A1C: Hgb A1c MFr Bld: 6.2 % (ref 4.6–6.5)

## 2017-07-17 LAB — POCT INR: INR: 2.5

## 2017-07-17 NOTE — Patient Instructions (Signed)
  Dermatology follow-up  cardiology follow-up    It is important that you exercise regularly, at least 20 minutes 3 to 4 times per week.  If you develop chest pain or shortness of breath seek  medical attention.

## 2017-07-17 NOTE — Progress Notes (Signed)
Subjective:    Patient ID: Amanda Wiggins, female    DOB: 06/04/1945, 72 y.o.   MRN: [id number]  HPI 72 year old patient who is seen today for a preventive health examination and subsequent Medicare wellness visit  She is followed closely by cardiology with coronary artery disease.  She is status post PCI /DES March 2017.  She has a history of chronic systolic heart failure which has been stable.  She has a history of bicuspid aortic valve aortic stenosis and is status post aortic valve repair.  She remains on chronic Coumadin anticoagulation.  Her sister has recently required surgery for a bicuspid aortic valve and also had an aneurysm of the a sending aorta.  She is followed closely by oncology with a history of breast cancer.  She has had recent imaging studies in February and again last month.  She had colonoscopy last year after false positive Cologuard.  Social history.  Still adjusting to the death of her husband in 10/16/2022 of last year  Past Medical History:  Diagnosis Date  . Aortic valve replaced   . Breast cancer ([place]) 2005  . FHx: migraine headaches   . GERD (gastroesophageal reflux disease)   . Hiatal hernia   . Hx Breast Cancer, IDC, Stage I 07/03/2003  . Hx of breast cancer   . Hyperlipidemia   . Hypertension   . Menopausal syndrome   . Osteopenia   . 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 Personal history of radiation therapy   . Status post aortic valve repair      Social History   Socioeconomic History  . Marital status: Married    Spouse name: Not on file  . Number of children: Not on file  . Years of education: Not on file  . Highest education level: Not on file  Occupational History  . Not on file  Social Needs  . Financial resource strain: Not on file  . Food insecurity:    Worry: Not on file    Inability: Not on file  . Transportation needs:    Medical: Not on file    Non-medical: Not on file  Tobacco Use  . Smoking status:  Never Smoker  . Smokeless tobacco: Never Used  Substance and Sexual Activity  . Alcohol use: Yes    Alcohol/week: 0.0 oz    Comment: socially  . Drug use: No  . Sexual activity: Not on file  Lifestyle  . Physical activity:    Days per week: Not on file    Minutes per session: Not on file  . Stress: Not on file  Relationships  . Social connections:    Talks on phone: Not on file    Gets together: Not on file    Attends religious service: Not on file    Active member of club or organization: Not on file    Attends meetings of clubs or organizations: Not on file    Relationship status: Not on file  . Intimate partner violence:    Fear of current or ex partner: Not on file    Emotionally abused: Not on file    Physically abused: Not on file    Forced sexual activity: Not on file  Other Topics Concern  . Not on file  Social History Narrative  . Not on file    Past Surgical History:  Procedure Laterality Date  . AORTIC VALVE REPLACEMENT  2003  . BREAST BIOPSY    . BREAST LUMPECTOMY  Left 2005  . CARDIAC CATHETERIZATION     Ejection Fraction   . CARDIAC CATHETERIZATION N/A 05/16/2015   Procedure: Left Heart Cath and Coronary Angiography;  Surgeon: Leonie Man, MD;  Location: [facility];  Service: Cardiovascular;  Laterality: N/A;  . CARDIAC CATHETERIZATION  05/16/2015   Procedure: Coronary Stent Intervention;  Surgeon: Leonie Man, MD;  Location: [facility];  Service: Cardiovascular;;  . CARDIAC CATHETERIZATION N/A 05/19/2015   Procedure: Left Heart Cath and Coronary Angiography;  Surgeon: Troy Sine, MD;  Location: [facility];  Service: Cardiovascular;  Laterality: N/A;  . CARDIAC CATHETERIZATION N/A 05/19/2015   Procedure: Coronary Stent Intervention;  Surgeon: Troy Sine, MD;  Location: [facility];  Service: Cardiovascular;  Laterality: N/A;  . HIATAL HERNIA REPAIR    . HIATAL HERNIA REPAIR  05/11/2007  . hysterectomy,  endometiral cancer  2001  . lummpectomy breast cancer  07/03/2003  . nissan fundoplicaiton  0539   post-op hematoma on heparin   . right shouler surgery      Family History  Problem Relation Age of Onset  . Heart attack Mother 24  . Osteoarthritis Brother        hpercholesterolemia    Allergies  Allergen Reactions  . Meperidine Nausea Only  . Sulfa Antibiotics Nausea Only  . Crestor [Rosuvastatin Calcium]     Myalgias on 10mg  and 20mg  daily  . Latex Hives  . Meperidine Hcl Nausea And Vomiting  . Pravastatin     myalgias  . Sulfamethoxazole Nausea And Vomiting    REACTION: unspecified    Current Outpatient Medications on File Prior to Visit  Medication Sig Dispense Refill  . Alirocumab (PRALUENT) 75 MG/ML SOPN Inject 1 pen into the skin every 14 (fourteen) days. 2 pen 11  . BIOTIN PO Take 1 tablet by mouth every morning.     . Calcium Carbonate-Vitamin D (CALCIUM 600+D) 600-400 MG-UNIT per tablet Take 1 tablet by mouth 2 (two) times daily.      . carvedilol (COREG) 3.125 MG tablet TAKE 1 TABLET (3.125 MG TOTAL) BY MOUTH 2 (TWO) TIMES DAILY WITH A MEAL. 60 tablet 10  . clopidogrel (PLAVIX) 75 MG tablet TAKE 1 TABLET (75 MG TOTAL) BY MOUTH DAILY WITH BREAKFAST. 30 tablet 8  . cycloSPORINE (RESTASIS) 0.05 % ophthalmic emulsion Place 1 drop into both eyes 2 (two) times daily as needed (DRY EYE).     . Desoximetasone 0.05 % GEL Apply 1 application topically as needed (AS NEEDED TO AFFECTED AREA).     . Glucosamine-Chondroit-Vit C-Mn (GLUCOSAMINE CHONDR 1500 COMPLX) CAPS Take 1 capsule by mouth every morning.     . Influenza Vac Split High-Dose 0.5 ML SUSY Fluzone High-Dose 2015-16 (PF) 180 mcg/0.5 mL intramuscular syringe  TO BE ADMINISTERED BY PHARMACIST FOR IMMUNIZATION    . loratadine (CLARITIN) 10 MG tablet Take 10 mg by mouth daily.      . Multiple Vitamin (MULTIVITAMIN) capsule Take 1 capsule by mouth daily.      . nitroGLYCERIN (NITROSTAT) 0.4 MG SL tablet Place 1 tablet  (0.4 mg total) under the tongue every 5 (five) minutes as needed for chest pain. 25 tablet 2  . pantoprazole (PROTONIX) 40 MG tablet TAKE 1 TABLET BY MOUTH EVERY DAY 30 tablet 11  . PAZEO 0.7 % SOLN INSTILL 1 DROP INTO BOTH EYES DAILY AS NEEDED FOR ALLERGIES  5  . senna (SENOKOT) 8.6 MG tablet Take 2 tablets by mouth  daily as needed for constipation.     Marland Kitchen SHINGRIX injection TO BE ADMINISTERED BY PHARMACIST FOR IMMUNIZATION  0  . temazepam (RESTORIL) 30 MG capsule TAKE 1 CAPSULE (30 MG TOTAL) BY MOUTH AT BEDTIME AS NEEDED FOR SLEEP. 30 capsule 0  . temazepam (RESTORIL) 30 MG capsule Take 1 capsule (30 mg total) by mouth at bedtime as needed for sleep. 30 capsule 0  . warfarin (COUMADIN) 5 MG tablet TAKE AS DIRECTED BY COUMADIN CLINIC 90 tablet 1   Current Facility-Administered Medications on File Prior to Visit  Medication Dose Route Frequency Provider Last Rate Last Dose  . 0.9 %  sodium chloride infusion  500 mL Intravenous Continuous Gatha Mayer, MD        BP 120/64 (BP Location: Right Arm, Patient Position: Sitting, Cuff Size: Normal)   Pulse 74   Temp 98.5 F (36.9 C) (Oral)   Ht 5\' 2"  (1.575 m)   Wt 137 lb (62.1 kg)   SpO2 96%   BMI 25.06 kg/m   Subsequent Medicare wellness visit  1. Risk factors, based on past  M,S,F history.  Patient has known coronary artery disease.  Cardiovascular risk factors include a history of dyslipidemia.  She is status post AVR  2.  Physical activities: No major activity restrictions but endurance limited  3.  Depression/mood: Adjusting to the death of her husband last summer  4.  Hearing: No significant deficits.  Was seen by ENT in January for earwax removal  5.  ADL's: Independent  6.  Fall risk: Low  7.  Home safety:  No problems identified 8.  Height weight, and visual acuity; height and weight stable no change in visual acuity  9.  Counseling:  Continue heart healthy diet and active lifestyle 10. Lab orders based on risk factors:  Laboratory update will be reviewed  11. Referral : Follow-up cardiology  12. Care plan: Continue efforts at aggressive risk factor modification  13. Cognitive assessment: Alert and appropriate normal affect.  No cognitive dysfunction  14. Screening: Patient provided with a written and personalized 5-10 year screening schedule in the AVS.    15. Provider List Update: Cardiology oncology ophthalmology and primary care     Review of Systems  Constitutional: Negative.   HENT: Negative for congestion, dental problem, hearing loss, rhinorrhea, sinus pressure, sore throat and tinnitus.   Eyes: Negative for pain, discharge and visual disturbance.  Respiratory: Negative for cough and shortness of breath.   Cardiovascular: Negative for chest pain, palpitations and leg swelling.  Gastrointestinal: Negative for abdominal distention, abdominal pain, blood in stool, constipation, diarrhea, nausea and vomiting.  Genitourinary: Negative for difficulty urinating, dysuria, flank pain, frequency, hematuria, pelvic pain, urgency, vaginal bleeding, vaginal discharge and vaginal pain.  Musculoskeletal: Negative for arthralgias, gait problem and joint swelling.  Skin: Negative for rash.  Neurological: Negative for dizziness, syncope, speech difficulty, weakness, numbness and headaches.  Hematological: Negative for adenopathy.  Psychiatric/Behavioral: Positive for dysphoric mood. Negative for agitation and behavioral problems. The patient is nervous/anxious.        Objective:   Physical Exam  Constitutional: She is oriented to person, place, and time. She appears well-developed and well-nourished.  HENT:  Head: Normocephalic.  Right Ear: External ear normal.  Left Ear: External ear normal.  Mouth/Throat: Oropharynx is clear and moist.  Eyes: Pupils are equal, round, and reactive to light. Conjunctivae and EOM are normal.  Neck: Normal range of motion. Neck supple. No thyromegaly present.    Cardiovascular:  Normal rate, regular rhythm, normal heart sounds and intact distal pulses.  Status post sternotomy Grade 3/6 systolic murmur loudest at the primary aortic area Prosthetic heart sounds  Pulmonary/Chest: Effort normal and breath sounds normal.  Abdominal: Soft. Bowel sounds are normal. She exhibits no mass. There is no tenderness.  Lower midline scar  Musculoskeletal: Normal range of motion.  Lymphadenopathy:    She has no cervical adenopathy.  Neurological: She is alert and oriented to person, place, and time.  Skin: Skin is warm and dry. No rash noted.  Scattered ecchymoses  Psychiatric: She has a normal mood and affect. Her behavior is normal.          Assessment & Plan:   Preventive health Subsequent Medicare wellness visit Status post AVR.  Continue Coumadin anticoagulation History of breast cancer.  Follow-up oncology Dyslipidemia/statin intolerance.  Continue praluent   cardiology and oncology follow-up Check updated lab  [facility]

## 2017-07-21 ENCOUNTER — Ambulatory Visit: Payer: Medicare Other | Admitting: *Deleted

## 2017-07-21 DIAGNOSIS — Z952 Presence of prosthetic heart valve: Secondary | ICD-10-CM | POA: Diagnosis not present

## 2017-07-21 DIAGNOSIS — Z5181 Encounter for therapeutic drug level monitoring: Secondary | ICD-10-CM | POA: Diagnosis not present

## 2017-07-21 LAB — POCT INR: INR: 3.6

## 2017-07-21 NOTE — Patient Instructions (Addendum)
Description   Hold today's dose then start taking 1 tablet daily except 1/2 tablet on Mondays and Thursdays. Recheck in 2 weeks. [facility] [phone]

## 2017-08-09 ENCOUNTER — Ambulatory Visit: Payer: Medicare Other | Admitting: *Deleted

## 2017-08-09 DIAGNOSIS — Z952 Presence of prosthetic heart valve: Secondary | ICD-10-CM

## 2017-08-09 DIAGNOSIS — Z5181 Encounter for therapeutic drug level monitoring: Secondary | ICD-10-CM

## 2017-08-09 LAB — POCT INR: INR: 2.1 (ref 2.0–3.0)

## 2017-08-09 NOTE — Patient Instructions (Signed)
Description   Continue same dose of coumadin  1 tablet daily except 1/2 tablet on Mondays and Thursdays. Recheck in 3 weeks. [facility] [phone]

## 2017-08-14 ENCOUNTER — Inpatient Hospital Stay: Payer: Medicare Other | Attending: Hematology & Oncology

## 2017-08-14 ENCOUNTER — Inpatient Hospital Stay (HOSPITAL_BASED_OUTPATIENT_CLINIC_OR_DEPARTMENT_OTHER): Payer: Medicare Other | Admitting: Hematology & Oncology

## 2017-08-14 ENCOUNTER — Inpatient Hospital Stay: Payer: Medicare Other

## 2017-08-14 ENCOUNTER — Other Ambulatory Visit: Payer: Self-pay | Admitting: Family

## 2017-08-14 ENCOUNTER — Encounter: Payer: Self-pay | Admitting: Hematology & Oncology

## 2017-08-14 VITALS — BP 128/86 | HR 59 | Temp 98.4°F | Resp 16 | Wt 137.0 lb

## 2017-08-14 DIAGNOSIS — Z7901 Long term (current) use of anticoagulants: Secondary | ICD-10-CM

## 2017-08-14 DIAGNOSIS — Z853 Personal history of malignant neoplasm of breast: Secondary | ICD-10-CM | POA: Insufficient documentation

## 2017-08-14 DIAGNOSIS — Z923 Personal history of irradiation: Secondary | ICD-10-CM | POA: Diagnosis not present

## 2017-08-14 DIAGNOSIS — Z952 Presence of prosthetic heart valve: Secondary | ICD-10-CM | POA: Insufficient documentation

## 2017-08-14 DIAGNOSIS — T386X5A Adverse effect of antigonadotrophins, antiestrogens, antiandrogens, not elsewhere classified, initial encounter: Principal | ICD-10-CM

## 2017-08-14 DIAGNOSIS — M818 Other osteoporosis without current pathological fracture: Secondary | ICD-10-CM

## 2017-08-14 LAB — CMP (CANCER CENTER ONLY)
ALBUMIN: 4.2 g/dL (ref 3.5–5.0)
ALT: 19 U/L (ref 0–55)
ANION GAP: 6 (ref 3–11)
AST: 27 U/L (ref 5–34)
Alkaline Phosphatase: 57 U/L (ref 40–150)
BUN: 17 mg/dL (ref 7–26)
CHLORIDE: 104 mmol/L (ref 98–109)
CO2: 30 mmol/L — ABNORMAL HIGH (ref 22–29)
Calcium: 9.6 mg/dL (ref 8.4–10.4)
Creatinine: 0.89 mg/dL (ref 0.60–1.10)
GFR, Estimated: >60 mL/min (ref >60)
Glucose, Bld: 117 mg/dL (ref 70–140)
Potassium: 4.1 mmol/L (ref 3.5–5.1)
Sodium: 140 mmol/L (ref 136–145)
Total Bilirubin: 0.3 mg/dL (ref 0.2–1.2)
Total Protein: 6.9 g/dL (ref 6.4–8.3)

## 2017-08-14 LAB — CBC WITH DIFFERENTIAL/PLATELET
Basophils Absolute: 0 10*3/uL (ref 0.0–0.1)
Basophils Relative: 0 %
Eosinophils Absolute: 0.1 10*3/uL (ref 0.0–0.7)
Eosinophils Relative: 2 %
HEMATOCRIT: 42.9 % (ref 36.0–46.0)
HEMOGLOBIN: 13.9 g/dL (ref 12.0–15.0)
LYMPHS PCT: 23 %
Lymphs Abs: 1.4 10*3/uL (ref 0.7–4.0)
MCH: 28.9 pg (ref 26.0–34.0)
MCHC: 32.4 g/dL (ref 30.0–36.0)
MCV: 89.2 fL (ref 78.0–100.0)
Monocytes Absolute: 0.6 10*3/uL (ref 0.1–1.0)
Monocytes Relative: 9 %
NEUTROS ABS: 4 10*3/uL (ref 1.7–7.7)
NEUTROS PCT: 66 %
Platelets: 317 10*3/uL (ref 150–400)
RBC: 4.81 MIL/uL (ref 3.87–5.11)
RDW: 13.4 % (ref 11.5–15.5)
WBC: 6.1 10*3/uL (ref 4.0–10.5)

## 2017-08-14 LAB — PROTIME-INR
INR: 2.98
Prothrombin Time: 30.8 seconds — ABNORMAL HIGH (ref 11.4–15.2)

## 2017-08-14 MED ORDER — SODIUM CHLORIDE 0.9 % IV SOLN
Freq: Once | INTRAVENOUS | Status: AC
  Administered 2017-08-14: 12:00:00 via INTRAVENOUS

## 2017-08-14 MED ORDER — TEMAZEPAM 30 MG PO CAPS: 30.0000 mg | ORAL_CAPSULE | Freq: Every evening | ORAL | 0 refills | Status: DC | PRN

## 2017-08-14 MED ORDER — ZOLEDRONIC ACID 4 MG/100ML IV SOLN
4.0000 mg | Freq: Once | INTRAVENOUS | Status: AC
  Administered 2017-08-14: 4 mg via INTRAVENOUS
  Filled 2017-08-14: qty 100

## 2017-08-14 NOTE — Patient Instructions (Signed)

## 2017-08-14 NOTE — Progress Notes (Signed)
Hematology and Oncology Follow Up Visit  Amanda Wiggins [id number] [birth year] 72 y.o. 08/14/2017   Principle Diagnosis:  1. Stage I (T1 N0 M0) ductal carcinoma of the left breast. 2. Mechanical aortic valve.  Current Therapy:    Coumadin-lifelong  Zometa 5 mg IV q. Year - given in June 2019     Interim History:  [name] is back for followup. She is doing okay.  We last saw her back in December.  Since then, she has been doing pretty well.  She has been staying pretty busy.  Her husband passed away last fall.  She did go on a cruise.  This was actually for her high school's 50th reunion.  She really had a good time on the cruise.  She has had no problems with her heart.  She has a mechanical heart valve.  This is an aortic valve.  She is on Coumadin.  She will get her Zometa today.  This is helping her bones.  She had a little bit of a "scare" recently.  She developed a tiny lump in her left breast.  This was at the lumpectomy site.  She did have a mammogram done and ultrasound done.  Thankfully this just showed some benign calcifications.  It was recommended that she have another mammogram in 1 year.  Overall, her performance status is ECOG 0.   Medications:  Current Outpatient Medications:  .  Alirocumab (PRALUENT) 75 MG/ML SOPN, Inject 1 pen into the skin every 14 (fourteen) days., Disp: 2 pen, Rfl: 11 .  BIOTIN PO, Take 1 tablet by mouth every morning. , Disp: , Rfl:  .  Calcium Carbonate-Vitamin D (CALCIUM 600+D) 600-400 MG-UNIT per tablet, Take 1 tablet by mouth 2 (two) times daily.  , Disp: , Rfl:  .  carvedilol (COREG) 3.125 MG tablet, TAKE 1 TABLET (3.125 MG TOTAL) BY MOUTH 2 (TWO) TIMES DAILY WITH A MEAL., Disp: 60 tablet, Rfl: 10 .  clopidogrel (PLAVIX) 75 MG tablet, TAKE 1 TABLET (75 MG TOTAL) BY MOUTH DAILY WITH BREAKFAST., Disp: 30 tablet, Rfl: 8 .  cycloSPORINE (RESTASIS) 0.05 % ophthalmic emulsion, Place 1 drop into both eyes 2 (two) times daily as needed (DRY  EYE). , Disp: , Rfl:  .  Desoximetasone 0.05 % GEL, Apply 1 application topically as needed (AS NEEDED TO AFFECTED AREA). , Disp: , Rfl:  .  Glucosamine-Chondroit-Vit C-Mn (GLUCOSAMINE CHONDR 1500 COMPLX) CAPS, Take 1 capsule by mouth every morning. , Disp: , Rfl:  .  Influenza Vac Split High-Dose 0.5 ML SUSY, Fluzone High-Dose 2015-16 (PF) 180 mcg/0.5 mL intramuscular syringe  TO BE ADMINISTERED BY PHARMACIST FOR IMMUNIZATION, Disp: , Rfl:  .  loratadine (CLARITIN) 10 MG tablet, Take 10 mg by mouth daily.  , Disp: , Rfl:  .  Multiple Vitamin (MULTIVITAMIN) capsule, Take 1 capsule by mouth daily.  , Disp: , Rfl:  .  nitroGLYCERIN (NITROSTAT) 0.4 MG SL tablet, Place 1 tablet (0.4 mg total) under the tongue every 5 (five) minutes as needed for chest pain., Disp: 25 tablet, Rfl: 2 .  pantoprazole (PROTONIX) 40 MG tablet, TAKE 1 TABLET BY MOUTH EVERY DAY, Disp: 30 tablet, Rfl: 11 .  PAZEO 0.7 % SOLN, INSTILL 1 DROP INTO BOTH EYES DAILY AS NEEDED FOR ALLERGIES, Disp: , Rfl: 5 .  senna (SENOKOT) 8.6 MG tablet, Take 2 tablets by mouth daily as needed for constipation. , Disp: , Rfl:  .  SHINGRIX injection, TO BE ADMINISTERED BY PHARMACIST FOR IMMUNIZATION,  Disp: , Rfl: 0 .  temazepam (RESTORIL) 30 MG capsule, Take 1 capsule (30 mg total) by mouth at bedtime as needed for sleep., Disp: 90 capsule, Rfl: 0 .  warfarin (COUMADIN) 5 MG tablet, TAKE AS DIRECTED BY COUMADIN CLINIC, Disp: 90 tablet, Rfl: 1  Current Facility-Administered Medications:  .  0.9 %  sodium chloride infusion, 500 mL, Intravenous, Continuous, Gatha Mayer, MD  Facility-Administered Medications Ordered in Other Visits:  .  Zoledronic Acid (ZOMETA) IVPB 4 mg, 4 mg, Intravenous, Once, [place], Holli Humbles, NP, Last Rate: 400 mL/hr at 08/14/17 1256, 4 mg at 08/14/17 1256  Allergies:  Allergies  Allergen Reactions  . Meperidine Nausea Only  . Meperidine Hcl Nausea Only  . Sulfa Antibiotics Nausea Only  . Crestor [Rosuvastatin  Calcium]     Myalgias on 10mg  and 20mg  daily  . Latex Hives  . Meperidine Hcl Nausea And Vomiting  . Pravastatin     myalgias  . Sulfamethoxazole Nausea And Vomiting    REACTION: unspecified    Past Medical History, Surgical history, Social history, and Family History were reviewed and updated.  Review of Systems: Review of Systems  Constitutional: Negative.   HENT: Negative.   Eyes: Negative.   Cardiovascular: Negative.   Gastrointestinal: Negative.   Genitourinary: Negative.   Musculoskeletal: Negative.   Skin: Negative.   Neurological: Negative.   Endo/Heme/Allergies: Negative.   Psychiatric/Behavioral: Negative.      Physical Exam:  weight is 137 lb (62.1 kg). Her oral temperature is 98.4 F (36.9 C). Her blood pressure is 128/86 and her pulse is 59 (abnormal). Her respiration is 16 and oxygen saturation is 99%.   Physical Exam  Constitutional: She is oriented to person, place, and time.  HENT:  Head: Normocephalic and atraumatic.  Mouth/Throat: Oropharynx is clear and moist.  Eyes: Pupils are equal, round, and reactive to light. EOM are normal.  Neck: Normal range of motion.  Cardiovascular: Normal rate, regular rhythm and normal heart sounds.  Pulmonary/Chest: Effort normal and breath sounds normal.  Abdominal: Soft. Bowel sounds are normal.  Musculoskeletal: Normal range of motion. She exhibits no edema, tenderness or deformity.  Lymphadenopathy:    She has no cervical adenopathy.  Neurological: She is alert and oriented to person, place, and time.  Skin: Skin is warm and dry. No rash noted. No erythema.  Psychiatric: She has a normal mood and affect. Her behavior is normal. Judgment and thought content normal.  Vitals reviewed.   Lab Results  Component Value Date   WBC 6.1 08/14/2017   HGB 13.9 08/14/2017   HCT 42.9 08/14/2017   MCV 89.2 08/14/2017   PLT 317 08/14/2017     Chemistry      Component Value Date/Time   NA 140 08/14/2017 0955   NA 142  04/19/2017 0954   NA 147 (H) 02/27/2017 0952   NA 142 02/12/2015 1125   K 4.1 08/14/2017 0955   K 4.3 02/27/2017 0952   K 3.8 02/12/2015 1125   CL 104 08/14/2017 0955   CL 106 02/27/2017 0952   CO2 30 (H) 08/14/2017 0955   CO2 30 02/27/2017 0952   CO2 24 02/12/2015 1125   BUN 17 08/14/2017 0955   BUN 16 04/19/2017 0954   BUN 14 02/27/2017 0952   BUN 14.7 02/12/2015 1125   CREATININE 0.89 08/14/2017 0955   CREATININE 0.9 02/27/2017 0952   CREATININE 1.0 02/12/2015 1125      Component Value Date/Time   CALCIUM 9.6 08/14/2017  0955   CALCIUM 9.2 02/27/2017 0952   CALCIUM 10.2 02/12/2015 1125   ALKPHOS 57 08/14/2017 0955   ALKPHOS 55 02/27/2017 0952   ALKPHOS 56 02/12/2015 1125   AST 27 08/14/2017 0955   AST 35 (H) 02/12/2015 1125   ALT 19 08/14/2017 0955   ALT 22 02/27/2017 0952   ALT 39 02/12/2015 1125   BILITOT 0.3 08/14/2017 0955   BILITOT 0.45 02/12/2015 1125       Impression and Plan: Amanda Wiggins is 63-year  old female with a history of stage I infiltrating duct carcinoma the left breast. She underwent lumpectomy.  She  had been on Femara. She had radiation. It has been 11  years now.  Everything looks good right now.  I do not see any problems from a malignant point of view.  I do not see any evidence of recurrent breast cancer.  We will go ahead and plan to get her back in 6 months.    Volanda Napoleon, MD 6/10/20191:02 PM

## 2017-08-22 ENCOUNTER — Other Ambulatory Visit: Payer: Self-pay | Admitting: Physician Assistant

## 2017-08-26 IMAGING — MG MM SCREENING BREAST TOMO BILATERAL
8 of 12 series · 8 of 28 positions shown · non-contrast
Comparison: Previous exam(s).

CLINICAL DATA: Screening.

EXAM:
2D DIGITAL SCREENING BILATERAL MAMMOGRAM WITH CAD AND ADJUNCT TOMO

[R CC synth-2D]
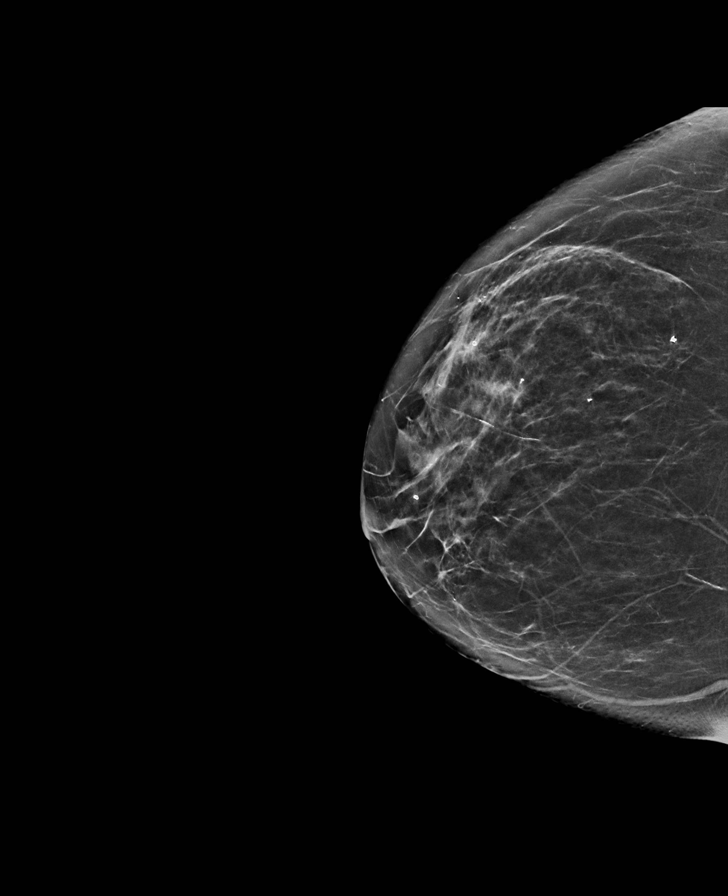

[L CC synth-2D]
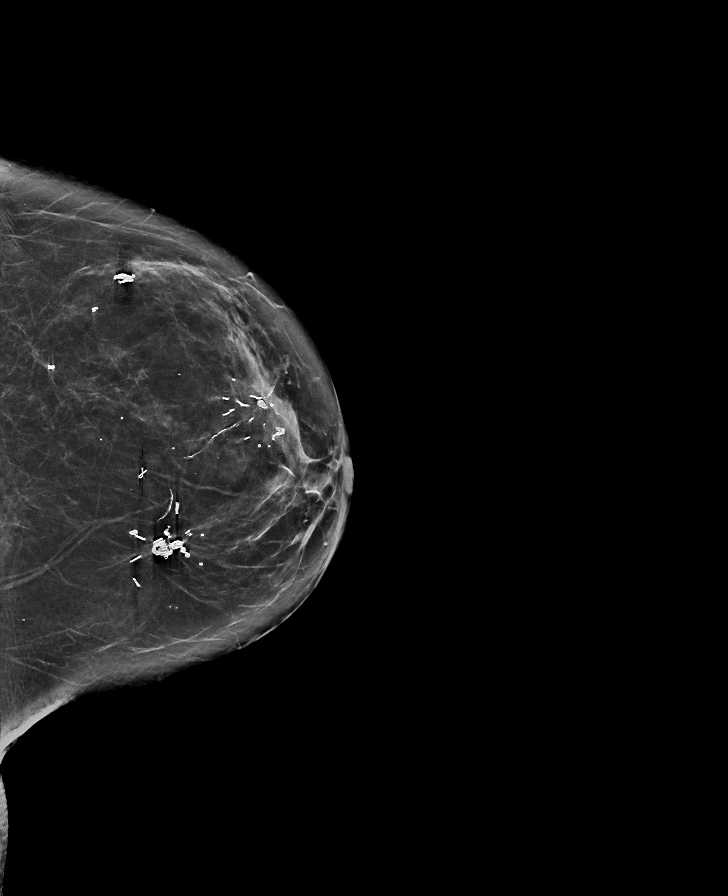

[R MLO]
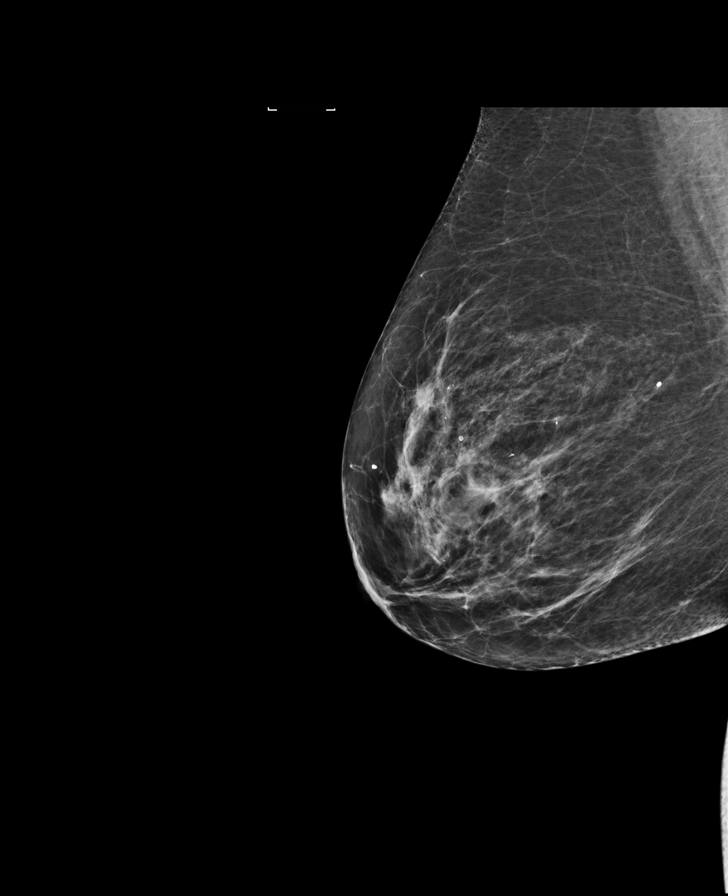

[R CC]
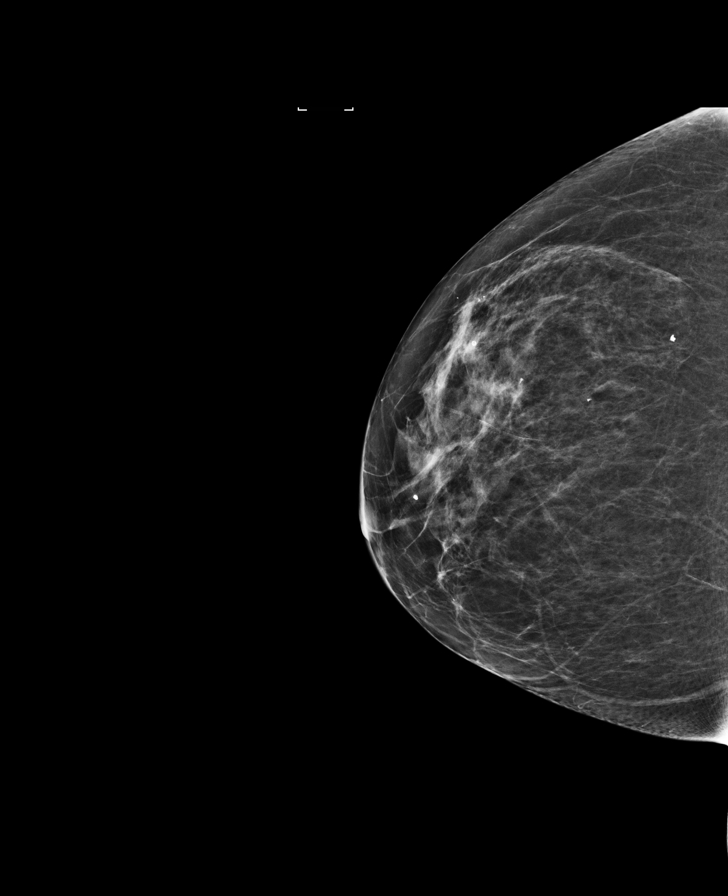

[L MLO synth-2D]
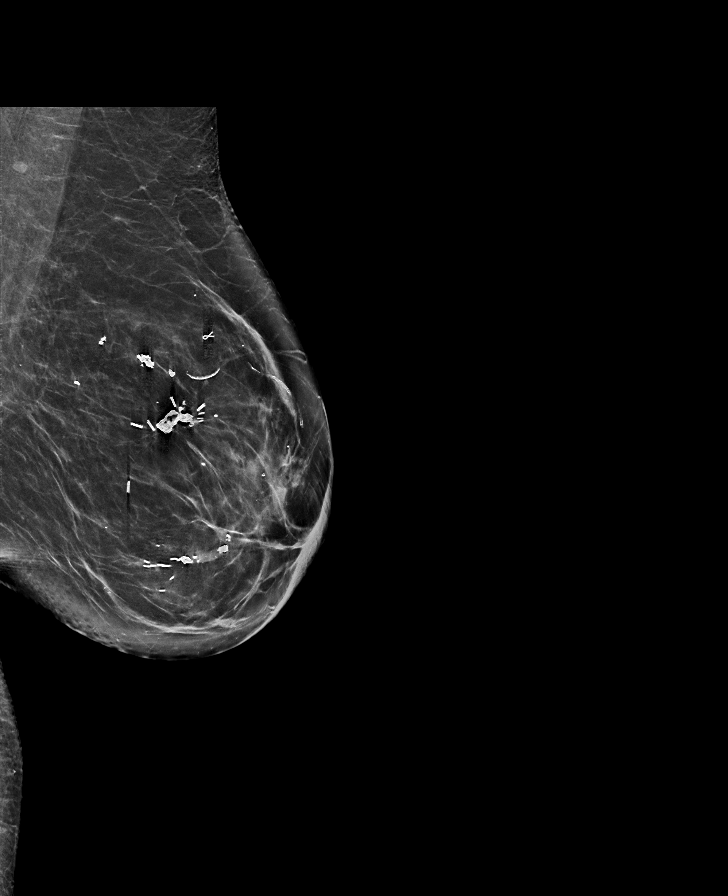

[L MLO]
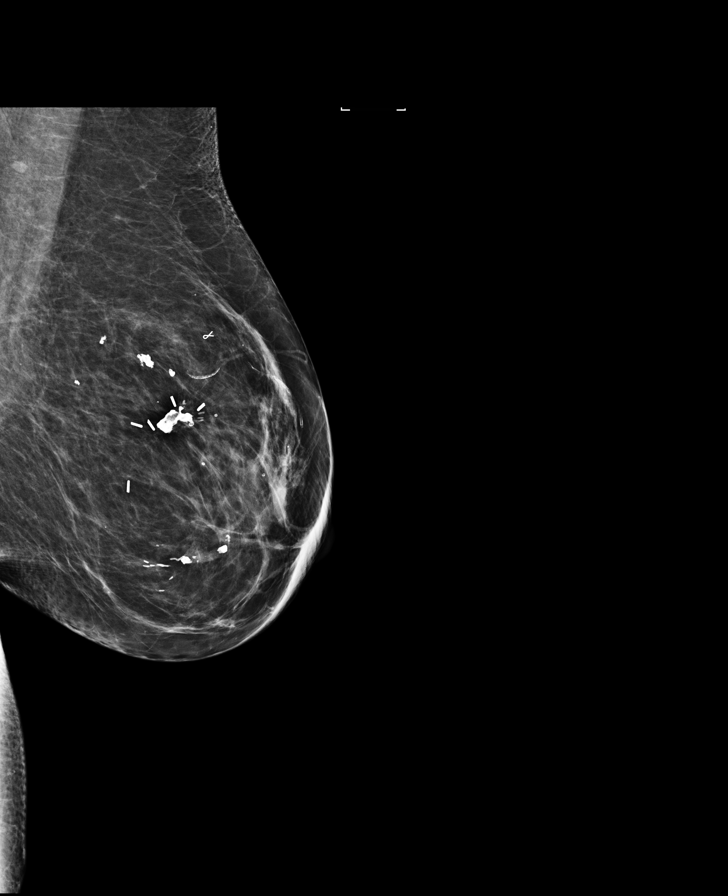

[L CC]
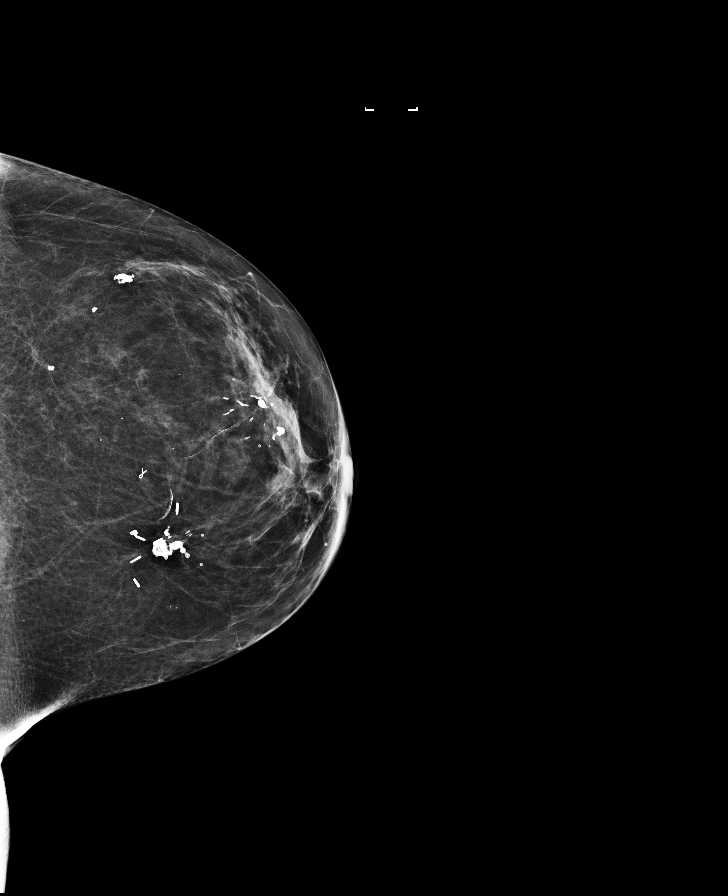

[R MLO synth-2D]
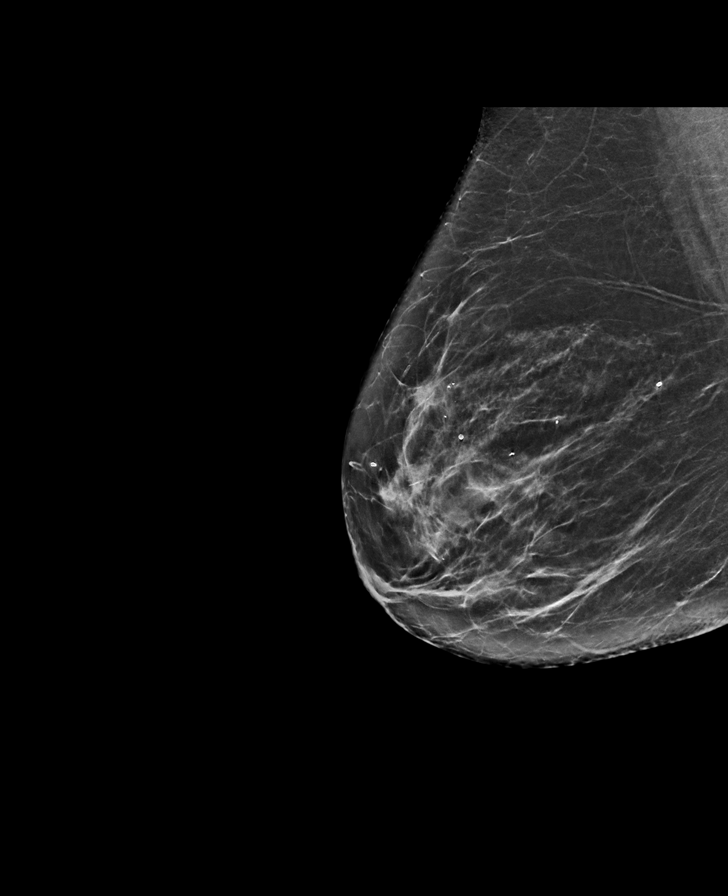

[8 of 28 positions shown; findings below may reference images not displayed]

ACR Breast Density Category b: There are scattered areas of
fibroglandular density.
FINDINGS: There are no findings suspicious for malignancy. Images were
processed with CAD.
IMPRESSION: No mammographic evidence of malignancy. A result letter of this
screening mammogram will be mailed directly to the patient.

RECOMMENDATION:
Screening mammogram in one year. (Code:97-6-RS4)

BI-RADS CATEGORY  1: Negative.

## 2017-08-30 ENCOUNTER — Ambulatory Visit: Payer: Medicare Other | Admitting: *Deleted

## 2017-08-30 DIAGNOSIS — Z952 Presence of prosthetic heart valve: Secondary | ICD-10-CM

## 2017-08-30 DIAGNOSIS — Z5181 Encounter for therapeutic drug level monitoring: Secondary | ICD-10-CM

## 2017-08-30 LAB — POCT INR: INR: 3 (ref 2.0–3.0)

## 2017-08-30 NOTE — Patient Instructions (Signed)
Description   Today take 1/2 tablet then continue same dose of coumadin 1 tablet daily except 1/2 tablet on Mondays and Thursdays. Recheck in 4 weeks. [facility] Clinic [phone]

## 2017-09-01 ENCOUNTER — Other Ambulatory Visit: Payer: Self-pay | Admitting: Cardiovascular Disease

## 2017-09-26 ENCOUNTER — Ambulatory Visit: Payer: Medicare Other | Admitting: Pharmacist

## 2017-09-26 DIAGNOSIS — Z5181 Encounter for therapeutic drug level monitoring: Secondary | ICD-10-CM

## 2017-09-26 DIAGNOSIS — Z952 Presence of prosthetic heart valve: Secondary | ICD-10-CM

## 2017-09-26 LAB — POCT INR: INR: 3.7 — AB (ref 2.0–3.0)

## 2017-09-26 NOTE — Patient Instructions (Signed)
Description   Skip your Coumadin today, then continue same dose of coumadin 1 tablet daily except 1/2 tablet on Mondays and Thursdays. Recheck in 3 weeks. [facility] [phone]

## 2017-10-12 ENCOUNTER — Encounter: Payer: Self-pay | Admitting: Internal Medicine

## 2017-10-12 ENCOUNTER — Ambulatory Visit: Payer: Medicare Other | Admitting: Internal Medicine

## 2017-10-12 VITALS — BP 120/70 | HR 68 | Temp 98.5°F | Wt 139.2 lb

## 2017-10-12 DIAGNOSIS — L237 Allergic contact dermatitis due to plants, except food: Secondary | ICD-10-CM

## 2017-10-12 MED ORDER — METHYLPREDNISOLONE ACETATE 80 MG/ML IJ SUSP
80.0000 mg | Freq: Once | INTRAMUSCULAR | Status: AC
  Administered 2017-10-12: 80 mg via INTRAMUSCULAR

## 2017-10-12 NOTE — Patient Instructions (Signed)
Poison Ivy Dermatitis Poison ivy dermatitis is redness and soreness (inflammation) of the skin. It is caused by a chemical that is found on the leaves of the poison ivy plant. You may also have itching, a rash, and blisters. Symptoms often clear up in 1-2 weeks. You may get this condition by touching a poison ivy plant. You can also get it by touching something that has the chemical on it. This may include animals or objects that have come in contact with the plant. Follow these instructions at home: General instructions  Take or apply over-the-counter and prescription medicines only as told by your doctor.  If you touch poison ivy, wash your skin with soap and cold water right away.  Use hydrocortisone creams or calamine lotion as needed to help with itching.  Take oatmeal baths as needed. Use colloidal oatmeal. You can get this at a pharmacy or grocery store. Follow the instructions on the package.  Do not scratch or rub your skin.  While you have the rash, wash your clothes right after you wear them. Prevention  Know what poison ivy looks like so you can avoid it. This plant has three leaves with flowering branches on a single stem. The leaves are glossy. They have uneven edges that come to a point at the front.  If you have touched poison ivy, wash with soap and water right away. Be sure to wash under your fingernails.  When hiking or camping, wear long pants, a long-sleeved shirt, tall socks, and hiking boots. You can also use a lotion on your skin that helps to prevent contact with the chemical on the plant.  If you think that your clothes or outdoor gear came in contact with poison ivy, rinse them off with a garden hose before you bring them inside your house. Contact a doctor if:  You have open sores in the rash area.  You have more redness, swelling, or pain in the affected area.  You have redness that spreads beyond the rash area.  You have fluid, blood, or pus coming from  the affected area.  You have a fever.  You have a rash over a large area of your body.  You have a rash on your eyes, mouth, or genitals.  Your rash does not get better after a few days. Get help right away if:  Your face swells or your eyes swell shut.  You have trouble breathing.  You have trouble swallowing. This information is not intended to replace advice given to you by your health care provider. Make sure you discuss any questions you have with your health care provider. Document Released: 03/26/2010 Document Revised: 07/30/2015 Document Reviewed: 07/30/2014 Elsevier Interactive Patient Education  2018 [employer]  

## 2017-10-17 ENCOUNTER — Ambulatory Visit: Payer: Medicare Other | Admitting: *Deleted

## 2017-10-17 DIAGNOSIS — Z5181 Encounter for therapeutic drug level monitoring: Secondary | ICD-10-CM | POA: Diagnosis not present

## 2017-10-17 DIAGNOSIS — Z952 Presence of prosthetic heart valve: Secondary | ICD-10-CM | POA: Diagnosis not present

## 2017-10-17 LAB — POCT INR: INR: 3.5 — AB (ref 2.0–3.0)

## 2017-10-17 NOTE — Patient Instructions (Signed)
Description   Skip your Coumadin today, then change your dose of coumadin to 1 tablet daily except 1/2 tablet on Mondays, Wednesdays and Fridays. Recheck in 2 weeks. [facility] [phone]

## 2017-10-30 ENCOUNTER — Ambulatory Visit (INDEPENDENT_AMBULATORY_CARE_PROVIDER_SITE_OTHER): Payer: Medicare Other | Admitting: Pharmacist

## 2017-10-30 ENCOUNTER — Ambulatory Visit: Payer: Medicare Other | Admitting: Cardiovascular Disease

## 2017-10-30 ENCOUNTER — Encounter: Payer: Self-pay | Admitting: Cardiovascular Disease

## 2017-10-30 VITALS — BP 122/72 | HR 61 | Ht 62.0 in | Wt 137.2 lb

## 2017-10-30 DIAGNOSIS — Z5181 Encounter for therapeutic drug level monitoring: Secondary | ICD-10-CM | POA: Diagnosis not present

## 2017-10-30 DIAGNOSIS — Z952 Presence of prosthetic heart valve: Secondary | ICD-10-CM

## 2017-10-30 DIAGNOSIS — Z7901 Long term (current) use of anticoagulants: Secondary | ICD-10-CM

## 2017-10-30 DIAGNOSIS — E785 Hyperlipidemia, unspecified: Secondary | ICD-10-CM | POA: Diagnosis not present

## 2017-10-30 LAB — BASIC METABOLIC PANEL
BUN / CREAT RATIO: 18 (ref 12–28)
BUN: 16 mg/dL (ref 8–27)
CHLORIDE: 103 mmol/L (ref 96–106)
CO2: 25 mmol/L (ref 20–29)
Calcium: 9.5 mg/dL (ref 8.7–10.3)
Creatinine, Ser: 0.91 mg/dL (ref 0.57–1.00)
GFR calc Af Amer: 74 mL/min/{1.73_m2} (ref >59)
GFR calc non Af Amer: 64 mL/min/{1.73_m2} (ref >59)
GLUCOSE: 113 mg/dL — AB (ref 65–99)
POTASSIUM: 3.9 mmol/L (ref 3.5–5.2)
SODIUM: 141 mmol/L (ref 134–144)

## 2017-10-30 LAB — HEPATIC FUNCTION PANEL
ALT: 20 IU/L (ref 0–32)
AST: 29 IU/L (ref 0–40)
Albumin: 4.4 g/dL (ref 3.5–4.8)
Alkaline Phosphatase: 55 IU/L (ref 39–117)
BILIRUBIN, DIRECT: 0.1 mg/dL (ref 0.00–0.40)
Bilirubin Total: 0.4 mg/dL (ref 0.0–1.2)
TOTAL PROTEIN: 6.8 g/dL (ref 6.0–8.5)

## 2017-10-30 LAB — LIPID PANEL
CHOL/HDL RATIO: 3 ratio (ref 0.0–4.4)
Cholesterol, Total: 153 mg/dL (ref 100–199)
HDL: 51 mg/dL (ref >39)
LDL CALC: 68 mg/dL (ref 0–99)
Triglycerides: 172 mg/dL — ABNORMAL HIGH (ref 0–149)
VLDL Cholesterol Cal: 34 mg/dL (ref 5–40)

## 2017-10-30 LAB — POCT INR: INR: 3.1 — AB (ref 2.0–3.0)

## 2017-10-30 MED ORDER — NITROGLYCERIN 0.4 MG SL SUBL: 0.4000 mg | SUBLINGUAL_TABLET | SUBLINGUAL | 6 refills | Status: DC | PRN

## 2017-10-30 NOTE — Progress Notes (Signed)
Amanda Wiggins Date of Birth  Feb 25, 1946 [facility] [address]    [place], [place]  [address] [phone]  Fax  [phone]  Problem List 1. Aortic valve replacement 2. Hyperlipidemia 3. Breast cancer 4. CAD -  1. Mid LAD to Dist LAD lesion, 100% stenosed. Post intervention with a single Synergy DES 2.25 mm x 20 mm S/p mini vision stent to distal LAD     Amanda Wiggins is a 72 year old female with a history of aortic stenosis-status post aortic valve replacement.  She also has a history of hypercholesterolemia.  She's not had any episodes of chest pain or shortness of breath.  She's had her INR levels checked in our Coumadin clinic. Her INR levels have all been therapeutic.  Nov. 6 , 2014  Amanda Wiggins is doing well.  i saw her a year ago.  She is 10 years our from her breast cancer and is doing well.    No cardiac complaints.   Able to do all of her normal activities.     Nov. 13, 2015:  Amanda Wiggins is doing well.   No CP .  No dyspnea.   Has been stressful.  Her mother died this past year.   Her INR levels have been  Well controlled   Nov. 14, 2016:  Doing well.  No cardiac issues.  INR levels have been stable,  A bit low the last time .  Had an AVR 13 years ago .    June 05, 2015:  Amanda Wiggins was recently seen at the hospital for an acute anterior wall ST segment elevation myocardial infarction. She had stenting of her mid LAD. She had recurrent pain on the day of her original discharge and was taken back to the Cath Lab and had a second stent placed. She continued to have some intermittent episodes of chest discomfort. She has had persistent ST segment elevation. Her left ventricular systolic function is moderately depressed with an EF of 40-45%.   She has akinesis of the distal anterior wall and apex.   She is not having the band like chest pain that she had on presentation Has had some GERD.   Asked about going back on Omeprazole  - we discussed her need for Protonix with  the plavix .   September 01, 2015:  Amanda Wiggins is seen back for follow up visit. Still having trouble getting over her MI .  Still fatigued but is slowly getting better.   No CP.      Dec. 12, 2017:  feeling ok Does not have the energy that she wants ( and used to have prior to her MI )  BP has been in the 90-100 range.   September 21, 2016:    Amanda Wiggins is seen today for her AVR and CAD / MI  Her husband, [name], died last week  Has some leg aches.   Feb. 13, 2019: Amanda Wiggins is seen back today for follow up of her AVR and CHF Echo shows LV EF of 40% , mean AV gradient is 21 mmHg.  Is on coumadin and plavix  Has occasional left shoulder pain  Had stenting of LAD in March 2017.    Has had some balance issues.   Saw ENT. Thinks it may be orthostatic hypotension  Had ears cleaned out.   Feels better  Aug. 26, 2019:  Doing well. Has CAD, AVR   No recent CP , Gets winded with little exertion EF is 40%.    Bruises  quite a bit - is on coumadin and plavix   Current Outpatient Medications on File Prior to Visit  Medication Sig Dispense Refill  . Alirocumab (PRALUENT) 75 MG/ML SOPN Inject 1 pen into the skin every 14 (fourteen) days. 2 pen 11  . BIOTIN PO Take 1 tablet by mouth every morning.     . Calcium Carbonate-Vitamin D (CALCIUM 600+D) 600-400 MG-UNIT per tablet Take 1 tablet by mouth 2 (two) times daily.      . carvedilol (COREG) 3.125 MG tablet TAKE 1 TABLET (3.125 MG TOTAL) BY MOUTH 2 (TWO) TIMES DAILY WITH A MEAL. 180 tablet 2  . clopidogrel (PLAVIX) 75 MG tablet TAKE 1 TABLET (75 MG TOTAL) BY MOUTH DAILY WITH BREAKFAST. 30 tablet 8  . cycloSPORINE (RESTASIS) 0.05 % ophthalmic emulsion Place 1 drop into both eyes 2 (two) times daily as needed (DRY EYE).     . Desoximetasone 0.05 % GEL Apply 1 application topically as needed (AS NEEDED TO AFFECTED AREA).     . Glucosamine-Chondroit-Vit C-Mn (GLUCOSAMINE CHONDR 1500 COMPLX) CAPS Take 1 capsule by mouth every morning.     . Influenza Vac Split  High-Dose 0.5 ML SUSY Fluzone High-Dose 2015-16 (PF) 180 mcg/0.5 mL intramuscular syringe  TO BE ADMINISTERED BY PHARMACIST FOR IMMUNIZATION    . loratadine (CLARITIN) 10 MG tablet Take 10 mg by mouth daily.      . Multiple Vitamin (MULTIVITAMIN) capsule Take 1 capsule by mouth daily.      . nitroGLYCERIN (NITROSTAT) 0.4 MG SL tablet Place 1 tablet (0.4 mg total) under the tongue every 5 (five) minutes as needed for chest pain. 25 tablet 2  . pantoprazole (PROTONIX) 40 MG tablet TAKE 1 TABLET BY MOUTH EVERY DAY 30 tablet 11  . PAZEO 0.7 % SOLN INSTILL 1 DROP INTO BOTH EYES DAILY AS NEEDED FOR ALLERGIES  5  . senna (SENOKOT) 8.6 MG tablet Take 2 tablets by mouth daily as needed for constipation.     Marland Kitchen SHINGRIX injection TO BE ADMINISTERED BY PHARMACIST FOR IMMUNIZATION  0  . temazepam (RESTORIL) 30 MG capsule Take 1 capsule (30 mg total) by mouth at bedtime as needed for sleep. 90 capsule 0  . warfarin (COUMADIN) 5 MG tablet TAKE AS DIRECTED BY COUMADIN CLINIC 90 tablet 1   Current Facility-Administered Medications on File Prior to Visit  Medication Dose Route Frequency Provider Last Rate Last Dose  . 0.9 %  sodium chloride infusion  500 mL Intravenous Continuous Gatha Mayer, MD        Allergies  Allergen Reactions  . Meperidine Nausea Only  . Meperidine Hcl Nausea Only  . Sulfa Antibiotics Nausea Only  . Crestor [Rosuvastatin Calcium]     Myalgias on 10mg  and 20mg  daily  . Latex Hives  . Meperidine Hcl Nausea And Vomiting  . Pravastatin     myalgias  . Sulfamethoxazole Nausea And Vomiting    REACTION: unspecified    Past Medical History:  Diagnosis Date  . Aortic valve replaced   . Breast cancer ([place]) 2005  . FHx: migraine headaches   . GERD (gastroesophageal reflux disease)   . Hiatal hernia   . Hx Breast Cancer, IDC, Stage I 07/03/2003  . Hx of breast cancer   . Hyperlipidemia   . Hypertension   . Menopausal syndrome   . Osteopenia   . Osteoporosis due to aromatase  inhibitor 08/22/2011  . Personal history of radiation therapy   . Status post aortic valve repair  Past Surgical History:  Procedure Laterality Date  . AORTIC VALVE REPLACEMENT  2003  . BREAST BIOPSY    . BREAST LUMPECTOMY Left 2005  . CARDIAC CATHETERIZATION     Ejection Fraction   . CARDIAC CATHETERIZATION N/A 05/16/2015   Procedure: Left Heart Cath and Coronary Angiography;  Surgeon: Leonie Man, MD;  Location: [facility];  Service: Cardiovascular;  Laterality: N/A;  . CARDIAC CATHETERIZATION  05/16/2015   Procedure: Coronary Stent Intervention;  Surgeon: Leonie Man, MD;  Location: [facility];  Service: Cardiovascular;;  . CARDIAC CATHETERIZATION N/A 05/19/2015   Procedure: Left Heart Cath and Coronary Angiography;  Surgeon: Troy Sine, MD;  Location: [facility];  Service: Cardiovascular;  Laterality: N/A;  . CARDIAC CATHETERIZATION N/A 05/19/2015   Procedure: Coronary Stent Intervention;  Surgeon: Troy Sine, MD;  Location: [facility];  Service: Cardiovascular;  Laterality: N/A;  . HIATAL HERNIA REPAIR    . HIATAL HERNIA REPAIR  05/11/2007  . hysterectomy, endometiral cancer  2001  . lummpectomy breast cancer  07/03/2003  . nissan fundoplicaiton  1914   post-op hematoma on heparin   . right shouler surgery      Social History   Tobacco Use  Smoking Status Never Smoker  Smokeless Tobacco Never Used    Social History   Substance and Sexual Activity  Alcohol Use Yes  . Alcohol/week: 0.0 standard drinks   Comment: socially    Family History  Problem Relation Age of Onset  . Heart attack Mother 57  . Osteoarthritis Brother        hpercholesterolemia    Reviw of Systems:  Noted in current history, otherwise review of systems is negative.  Physical Exam: Blood pressure 122/72, pulse 61, height 5\' 2"  (1.575 m), weight 137 lb 3.2 oz (62.2 kg), SpO2 98 %.  GEN:  Elderly female,  NAD  HEENT: Normal NECK: No JVD; No  carotid bruits LYMPHATICS: No lymphadenopathy CARDIAC: RR, mechanical S2  RESPIRATORY:  Clear to auscultation without rales, wheezing or rhonchi  ABDOMEN: Soft, non-tender, non-distended MUSCULOSKELETAL:  No edema; No deformity  SKIN: Warm and dry NEUROLOGIC:  Alert and oriented x 3    ECG:  Aug. 26, 2019: NSR at 62,RBBB,    Old septal MI  The RBBB is new    Assessment / Plan:   1. Aortic valve replacement -    Valve sound good contiue meds.,  Continue coumadin   2. Hyperlipidemia -  stable  3. Breast cancer -  Stable   4. CAD - is s/p stenting of her LAD x2. No recent episodes of angina  She has developed a RBBB.  No recent symptoms to suggest any ongoing ischemia.   Will follow for now.   Discussed with patient.   No indication for cath .  Will follow symptoms closely     5. Chronic systolic CHF:   EF is 78-29%.          Mertie Moores, MD  10/30/2017 8:50 AM    [facility],  [place], [place]  [address] Pager [phone] Phone: [phone]; Fax: [phone]

## 2017-10-30 NOTE — Patient Instructions (Signed)
Description   Skip your Coumadin today, then continue taking Coumadin 1 tablet daily except 1/2 tablet on Mondays, Wednesdays and Fridays. Recheck in 2 weeks. Coumadin Clinic [phone]

## 2017-10-30 NOTE — Patient Instructions (Signed)

## 2017-11-14 ENCOUNTER — Ambulatory Visit: Payer: Medicare Other | Admitting: *Deleted

## 2017-11-14 DIAGNOSIS — Z5181 Encounter for therapeutic drug level monitoring: Secondary | ICD-10-CM

## 2017-11-14 DIAGNOSIS — Z952 Presence of prosthetic heart valve: Secondary | ICD-10-CM | POA: Diagnosis not present

## 2017-11-14 LAB — POCT INR: INR: 3 (ref 2.0–3.0)

## 2017-11-14 NOTE — Patient Instructions (Signed)
Description   Continue taking 1 tablet everyday except 1/2 tablet on Mondays, Wednesdays and Fridays. Recheck in 2 weeks. Coumadin Clinic [phone]

## 2017-11-18 ENCOUNTER — Other Ambulatory Visit: Payer: Self-pay | Admitting: Hematology & Oncology

## 2017-11-18 DIAGNOSIS — Z853 Personal history of malignant neoplasm of breast: Secondary | ICD-10-CM

## 2017-11-28 ENCOUNTER — Ambulatory Visit: Payer: Medicare Other | Admitting: *Deleted

## 2017-11-28 DIAGNOSIS — Z952 Presence of prosthetic heart valve: Secondary | ICD-10-CM

## 2017-11-28 DIAGNOSIS — Z5181 Encounter for therapeutic drug level monitoring: Secondary | ICD-10-CM | POA: Diagnosis not present

## 2017-11-28 LAB — POCT INR: INR: 2.5 (ref 2.0–3.0)

## 2017-11-28 NOTE — Patient Instructions (Signed)
Description   Continue taking 1 tablet everyday except 1/2 tablet on Mondays, Wednesdays and Fridays. Recheck in 3 weeks. Coumadin Clinic [phone]

## 2017-12-26 ENCOUNTER — Telehealth: Payer: Self-pay | Admitting: Pharmacist

## 2017-12-26 ENCOUNTER — Ambulatory Visit: Payer: Medicare Other | Admitting: *Deleted

## 2017-12-26 DIAGNOSIS — Z952 Presence of prosthetic heart valve: Secondary | ICD-10-CM

## 2017-12-26 DIAGNOSIS — Z5181 Encounter for therapeutic drug level monitoring: Secondary | ICD-10-CM

## 2017-12-26 LAB — POCT INR: INR: 4 — AB (ref 2.0–3.0)

## 2017-12-26 NOTE — Telephone Encounter (Signed)
Pt has filled out [id number] PASS application for continued Praluent coverage - faxed to PASS today.

## 2017-12-26 NOTE — Patient Instructions (Signed)
Description   Skip today's dose, then start taking 1/2 tablet daily except 1 tablet on Sundays, Tuesdays, and Thursdays. Recheck in 2 weeks. Coumadin Clinic [phone]

## 2018-01-08 ENCOUNTER — Ambulatory Visit: Payer: Medicare Other

## 2018-01-08 DIAGNOSIS — Z5181 Encounter for therapeutic drug level monitoring: Secondary | ICD-10-CM | POA: Diagnosis not present

## 2018-01-08 DIAGNOSIS — Z952 Presence of prosthetic heart valve: Secondary | ICD-10-CM | POA: Diagnosis not present

## 2018-01-08 LAB — POCT INR: INR: 2 (ref 2.0–3.0)

## 2018-01-08 NOTE — Patient Instructions (Signed)
Description   Continue taking 1/2 tablet daily except 1 tablet on Sundays, Tuesdays, and Thursdays. Recheck in 3 weeks. Coumadin Clinic [phone]

## 2018-01-17 ENCOUNTER — Ambulatory Visit: Payer: Medicare Other | Admitting: Family Medicine

## 2018-01-17 ENCOUNTER — Encounter: Payer: Self-pay | Admitting: Family Medicine

## 2018-01-17 VITALS — BP 118/76 | HR 63 | Temp 98.2°F | Wt 142.0 lb

## 2018-01-17 DIAGNOSIS — Z7901 Long term (current) use of anticoagulants: Secondary | ICD-10-CM | POA: Diagnosis not present

## 2018-01-17 DIAGNOSIS — R2689 Other abnormalities of gait and mobility: Secondary | ICD-10-CM | POA: Diagnosis not present

## 2018-01-17 DIAGNOSIS — Z9861 Coronary angioplasty status: Secondary | ICD-10-CM

## 2018-01-17 DIAGNOSIS — H6123 Impacted cerumen, bilateral: Secondary | ICD-10-CM

## 2018-01-17 DIAGNOSIS — I2510 Atherosclerotic heart disease of native coronary artery without angina pectoris: Secondary | ICD-10-CM | POA: Diagnosis not present

## 2018-01-17 DIAGNOSIS — E782 Mixed hyperlipidemia: Secondary | ICD-10-CM

## 2018-01-17 NOTE — Progress Notes (Signed)
Subjective:    Patient ID: Amanda Wiggins, female    DOB: 1946/02/02, 72 y.o.   MRN: [id number]  No chief complaint on file.   HPI Patient was seen today for follow-up on chronic conditions and TOC, previously seen by Dr. Burnice Logan.  Balance concern: -Patient endorses history of feeling off balance ever since her MI x2 in 2017 -Has seen ENT -Notes walking down the hallway and running into the doorway  History of aortic valve replacement: -2003 -On Coumadin and Plavix 75 mg daily -Endorses easy bruising -Followed by cardiology, Dr. Acie Fredrickson  CAD, HLD: -Mid LAD to distal LAD with DES -Mini vision stent to distal LAD -On Coumadin and Plavix 75 mg daily -Followed by cardiology, Dr. Acie Fredrickson -Taking Coreg 3.125 mg twice daily  -states she never really had blood pressure issues until after her MIs. -on praluent bi weekly for cholesterol.  Statins caused myalgias and fatigue  Past Medical History:  Diagnosis Date  . Aortic valve replaced   . Breast cancer ([place]) 2005  . FHx: migraine headaches   . GERD (gastroesophageal reflux disease)   . Hiatal hernia   . Hx Breast Cancer, IDC, Stage I 07/03/2003  . Hx of breast cancer   . Hyperlipidemia   . Hypertension   . Menopausal syndrome   . Osteopenia   . 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 Personal history of radiation therapy   . Status post aortic valve repair     Allergies  Allergen Reactions  . Meperidine Nausea Only  . Meperidine Hcl Nausea Only  . Sulfa Antibiotics Nausea Only  . Crestor [Rosuvastatin Calcium]     Myalgias on 10mg  and 20mg  daily  . Latex Hives  . Meperidine Hcl Nausea And Vomiting  . Pravastatin     myalgias  . Sulfamethoxazole Nausea And Vomiting    REACTION: unspecified    ROS General: Denies fever, chills, night sweats, changes in weight, changes in appetite  +balance concern HEENT: Denies headaches, ear pain, changes in vision, rhinorrhea, sore throat CV: Denies CP,  palpitations, SOB, orthopnea Pulm: Denies SOB, cough, wheezing GI: Denies abdominal pain, nausea, vomiting, diarrhea, constipation GU: Denies dysuria, hematuria, frequency, vaginal discharge Msk: Denies muscle cramps, joint pains Neuro: Denies weakness, numbness, tingling Skin: Denies rashes, bruising Psych: Denies depression, anxiety, hallucinations    Objective:    Blood pressure 118/76, pulse 63, temperature 98.2 F (36.8 C), temperature source Oral, weight 142 lb (64.4 kg), SpO2 97 %.  Gen. Pleasant, well-nourished, in no distress, normal affect   HEENT: [place]/AT, face symmetric, conjunctiva clear, no scleral icterus, PERRLA, nares patent without drainage, pharynx without erythema or exudate.  Bilateral canals occluded with cerumen.  TMs normal after irrigation. Lungs: no accessory muscle use, CTAB, no wheezes or rales Cardiovascular: RRR, 2/6 murmur and click of heart valve heard, no peripheral edema Neuro:  A&Ox3, CN II-XII intact, normal gait  Wt Readings from Last 3 Encounters:  01/17/18 142 lb (64.4 kg)  10/30/17 137 lb 3.2 oz (62.2 kg)  10/12/17 139 lb 3.2 oz (63.1 kg)    Lab Results  Component Value Date   WBC 6.1 08/14/2017   HGB 13.9 08/14/2017   HCT 42.9 08/14/2017   PLT 317 08/14/2017   GLUCOSE 113 (H) 10/30/2017   CHOL 153 10/30/2017   TRIG 172 (H) 10/30/2017   HDL 51 10/30/2017   LDLCALC 68 10/30/2017   ALT 20 10/30/2017   AST 29 10/30/2017   NA 141 10/30/2017   K  3.9 10/30/2017   CL 103 10/30/2017   CREATININE 0.91 10/30/2017   BUN 16 10/30/2017   CO2 25 10/30/2017   TSH 3.09 07/17/2017   INR 2.0 01/08/2018   HGBA1C 6.2 07/17/2017    Assessment/Plan:  Balance problem  - Plan: Ambulatory referral to Physical Therapy  CAD S/P urgent PCI 05/16/15 -History of MI x2 with stents -Continue Coumadin and Plavix -Continue follow-up with cardiology  Chronic anticoagulation -h/o aortic valve and MI x 2 with stents -Continue Coumadin and  Plavix -Continue following with Coumadin clinic at the cardiology office.  Bilateral impacted cerumen -Consent obtained.  Bilateral ears irrigated.  Patient tolerated procedure well -Given handout  Mixed hyperlipidemia -Continue Praluent biweekly -Continue following with cardiology  F/u prn   [name], MD

## 2018-01-17 NOTE — Patient Instructions (Signed)
Earwax Buildup, Adult The ears produce a substance called earwax that helps keep bacteria out of the ear and protects the skin in the ear canal. Occasionally, earwax can build up in the ear and cause discomfort or hearing loss. What increases the risk? This condition is more likely to develop in people who:  Are female.  Are elderly.  Naturally produce more earwax.  Clean their ears often with cotton swabs.  Use earplugs often.  Use in-ear headphones often.  Wear hearing aids.  Have narrow ear canals.  Have earwax that is overly thick or sticky.  Have eczema.  Are dehydrated.  Have excess hair in the ear canal.  What are the signs or symptoms? Symptoms of this condition include:  Reduced or muffled hearing.  A feeling of fullness in the ear or feeling that the ear is plugged.  Fluid coming from the ear.  Ear pain.  Ear itch.  Ringing in the ear.  Coughing.  An obvious piece of earwax that can be seen inside the ear canal.  How is this diagnosed? This condition may be diagnosed based on:  Your symptoms.  Your medical history.  An ear exam. During the exam, your health care provider will look into your ear with an instrument called an otoscope.  You may have tests, including a hearing test. How is this treated? This condition may be treated by:  Using ear drops to soften the earwax.  Having the earwax removed by a health care provider. The health care provider may: ? Flush the ear with water. ? Use an instrument that has a loop on the end (curette). ? Use a suction device.  Surgery to remove the wax buildup. This may be done in severe cases.  Follow these instructions at home:  Take over-the-counter and prescription medicines only as told by your health care provider.  Do not put any objects, including cotton swabs, into your ear. You can clean the opening of your ear canal with a washcloth or facial tissue.  Follow instructions from your health  care provider about cleaning your ears. Do not over-clean your ears.  Drink enough fluid to keep your urine clear or pale yellow. This will help to thin the earwax.  Keep all follow-up visits as told by your health care provider. If earwax builds up in your ears often or if you use hearing aids, consider seeing your health care provider for routine, preventive ear cleanings. Ask your health care provider how often you should schedule your cleanings.  If you have hearing aids, clean them according to instructions from the manufacturer and your health care provider. Contact a health care provider if:  You have ear pain.  You develop a fever.  You have blood, pus, or other fluid coming from your ear.  You have hearing loss.  You have ringing in your ears that does not go away.  Your symptoms do not improve with treatment.  You feel like the room is spinning (vertigo). Summary  Earwax can build up in the ear and cause discomfort or hearing loss.  The most common symptoms of this condition include reduced or muffled hearing and a feeling of fullness in the ear or feeling that the ear is plugged.  This condition may be diagnosed based on your symptoms, your medical history, and an ear exam.  This condition may be treated by using ear drops to soften the earwax or by having the earwax removed by a health care provider.  Do   not put any objects, including cotton swabs, into your ear. You can clean the opening of your ear canal with a washcloth or facial tissue. This information is not intended to replace advice given to you by your health care provider. Make sure you discuss any questions you have with your health care provider. Document Released: 03/31/2004 Document Revised: 05/04/2016 Document Reviewed: 05/04/2016 Elsevier Interactive Patient Education  2018 [employer]  

## 2018-01-22 ENCOUNTER — Other Ambulatory Visit: Payer: Self-pay

## 2018-01-22 ENCOUNTER — Ambulatory Visit: Payer: Medicare Other | Attending: Family Medicine | Admitting: Physical Therapy

## 2018-01-22 ENCOUNTER — Encounter: Payer: Self-pay | Admitting: Physical Therapy

## 2018-01-22 DIAGNOSIS — R2681 Unsteadiness on feet: Secondary | ICD-10-CM | POA: Insufficient documentation

## 2018-01-22 DIAGNOSIS — M6281 Muscle weakness (generalized): Secondary | ICD-10-CM | POA: Diagnosis present

## 2018-01-22 NOTE — Therapy (Signed)
[facility] [place], [place], [address] Phone: [phone]   Fax:  [phone]  Physical Therapy Evaluation  Patient Details  Name: Amanda Wiggins MRN: [id number] Date of Birth: [birth year] Referring Provider (PT): [name],   Encounter Date: 01/22/2018  PT End of Session - 01/22/18 2159    Visit Number  1    Number of Visits  12    Date for PT Re-Evaluation  03/12/18    Authorization Type  Progress note every 10th visit; KX modifier at 15th visit    PT Start Time  1115    PT Stop Time  1207    PT Time Calculation (min)  52 min    Activity Tolerance  Patient tolerated treatment well    Behavior During Therapy  [facility] for tasks assessed/performed       Past Medical History:  Diagnosis Date  . Aortic valve replaced   . Breast cancer ([place]) 2005  . FHx: migraine headaches   . GERD (gastroesophageal reflux disease)   . Hiatal hernia   . Hx Breast Cancer, IDC, Stage I 07/03/2003  . Hx of breast cancer   . Hyperlipidemia   . Hypertension   . Menopausal syndrome   . Osteopenia   . 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 Personal history of radiation therapy   . Status post aortic valve repair     Past Surgical History:  Procedure Laterality Date  . AORTIC VALVE REPLACEMENT  2003  . BREAST BIOPSY    . BREAST LUMPECTOMY Left 2005  . CARDIAC CATHETERIZATION     Ejection Fraction   . CARDIAC CATHETERIZATION N/A 05/16/2015   Procedure: Left Heart Cath and Coronary Angiography;  Surgeon: Leonie Man, MD;  Location: [place] [facility];  Service: Cardiovascular;  Laterality: N/A;  . CARDIAC CATHETERIZATION  05/16/2015   Procedure: Coronary Stent Intervention;  Surgeon: Leonie Man, MD;  Location: [place] [facility];  Service: Cardiovascular;;  . CARDIAC CATHETERIZATION N/A 05/19/2015   Procedure: Left Heart Cath and Coronary Angiography;  Surgeon: Troy Sine, MD;  Location: [place] [facility];   Service: Cardiovascular;  Laterality: N/A;  . CARDIAC CATHETERIZATION N/A 05/19/2015   Procedure: Coronary Stent Intervention;  Surgeon: Troy Sine, MD;  Location: [place] [facility];  Service: Cardiovascular;  Laterality: N/A;  . HIATAL HERNIA REPAIR    . HIATAL HERNIA REPAIR  05/11/2007  . hysterectomy, endometiral cancer  2001  . lummpectomy breast cancer  07/03/2003  . nissan fundoplicaiton  [id number]   post-op hematoma on heparin   . right shouler surgery      There were no vitals filed for this visit.   Subjective Assessment - 01/22/18 2116    Subjective  Patient arrives to physical therapy with reports of ongoing balance problems and bilateral LE weakness. Patient reported having two heart attacks in the past two years and states her balance and weakness began roughly around that time. Patient contributes much of her LE weakness to the medication Crestor. Patient denies pain but reports achiness in LEs especially in bilateral thighs.  Patient reports she has not had any falls in the past 6 months but reports she frequently cuts corners and hits the wall and reports loss of balance especially while transitioning from sit to stand. Patient's goals are to improve balance, improve leg strength, and return to regular exercise.    Pertinent History  HTN, history of cancer, aortic valve replacement, 2  heart attacks 2017    Limitations  Standing;House hold activities    Patient Stated Goals  improve [facility]    Currently in Pain?  No/denies         [facility] PT Assessment - 01/22/18 0001      Assessment   Medical Diagnosis  Balance problem    Referring Provider (PT)  [name],    Onset Date/Surgical Date  --   ongoing   Next MD Visit  February 2020    Prior Therapy  not for this      Precautions   Precautions  None      Restrictions   Weight Bearing Restrictions  No      Balance Screen   Has the patient fallen in the past 6 months  No    Has  the patient had a decrease in activity level because of a fear of falling?   No    Is the patient reluctant to leave their home because of a fear of falling?   No      Home Film/video editor residence    Living Arrangements  Children      Prior Function   Level of Independence  Independent      ROM / Strength   AROM / PROM / Strength  Strength      Strength   Strength Assessment Site  Hip;Knee    Right/Left Hip  Right;Left    Right Hip Flexion  3+/5    Right Hip Extension  3/5    Right Hip ABduction  3+/5    Left Hip Flexion  3+/5    Left Hip ABduction  3+/5    Right/Left Knee  Right;Left    Right Knee Flexion  4-/5    Right Knee Extension  3+/5    Left Knee Flexion  4-/5    Left Knee Extension  3+/5      Standardized Balance Assessment   Standardized Balance Assessment  Berg Balance Test      Berg Balance Test   Sit to Stand  Able to stand without using hands and stabilize independently    Standing Unsupported  Able to stand safely 2 minutes    Sitting with Back Unsupported but Feet Supported on Floor or Stool  Able to sit safely and securely 2 minutes    Stand to Sit  Controls descent by using hands    Transfers  Able to transfer safely, minor use of hands    Standing Unsupported with Eyes Closed  Able to stand 10 seconds safely    Standing Ubsupported with Feet Together  Able to place feet together independently and stand 1 minute safely    From Standing, Reach Forward with Outstretched Arm  Can reach confidently >25 cm (10")    From Standing Position, Pick up Object from Floor  Able to pick up shoe safely and easily    From Standing Position, Turn to Look Behind Over each Shoulder  Looks behind from both sides and weight shifts well    Turn 360 Degrees  Able to turn 360 degrees safely in 4 seconds or less    Standing Unsupported, Alternately Place Feet on Step/Stool  Able to stand independently and complete 8 steps >20 seconds    Standing  Unsupported, One Foot in Front  Able to take small step independently and hold 30 seconds    Standing on One Leg  Able  to lift leg independently and hold > 10 seconds    Total Score  52                Objective measurements completed on examination: See above findings.              PT Education - 01/22/18 2200    Education Details  marching, heel raises, lateral stepping, mini squats, hip adduction    Person(s) Educated  Patient    Methods  Explanation;Handout    Comprehension  Verbalized understanding;Returned demonstration          PT Long Term Goals - 01/22/18 2202      PT LONG TERM GOAL #1   Title  Patient will be independent with HEP    Time  6    Period  Weeks    Status  New      PT LONG TERM GOAL #2   Title  Patient will demonstrate 4/5 bilateral knee and hip MMT to improve stability during functional tasks.    Time  6    Period  Weeks    Status  New      PT LONG TERM GOAL #3   Title  Patient will demonstrate 14 seconds on 5x sit ot stand test to improve her functional LE strength.     Time  6    Period  Weeks    Status  New      PT LONG TERM GOAL #4   Title  Patient will demonstrate 56/56 on Berg  Balance Scale to decrease risk of falls.    Time  6    Period  Weeks    Status  New             Plan - 01/22/18 2201    Clinical Impression Statement  Patient is a 72 year old female who presents to physical therapy with decreased LE strength bilaterally. Patient's 5x sit to stand score of 17 seconds categorizes her for a low fall risk with decreased functional LE strength. Patient's Berg Balance score of 52/56 categorizes her as a low fall risk. Patient noted with most difficulties with attaining full tandem stance and maintaining it. Patient ambulates with WFL gait pattern. Patient would benefit from skilled physical therapy to address deficits and address patient's goals.     Clinical Presentation  Stable    Clinical Decision Making  Low     Rehab Potential  Good    PT Frequency  2x / week    PT Duration  6 weeks    PT Treatment/Interventions  ADLs/Self Care Home Management;Electrical Stimulation;Cryotherapy;Iontophoresis 4mg /ml Dexamethasone;Moist Heat;Stair training;Gait training;Neuromuscular re-education;Therapeutic exercise;Therapeutic activities;Vestibular;Manual techniques;Passive range of motion    PT Next Visit Plan  Nustep, LE strengthening, balance activities    PT Home Exercise Plan  see pt education section    Consulted and Agree with Plan of Care  Patient       Patient will benefit from skilled therapeutic intervention in order to improve the following deficits and impairments:  Pain, Decreased activity tolerance, Decreased endurance, Decreased range of motion, Decreased strength  Visit Diagnosis: Muscle weakness (generalized) - Plan: PT plan of care cert/re-cert  Unsteadiness on feet - Plan: PT plan of care cert/re-cert     Problem List Patient Active Problem List   Diagnosis Date Noted  . Chronic systolic CHF (congestive heart failure) ([place]) 05/23/2015  . CAD S/P urgent PCI 05/16/15 05/19/2015  . Cardiomyopathy, ischemic 05/19/2015  . ST elevation (STEMI)  myocardial infarction involving left anterior descending coronary artery ([place])   . Acute ST elevation myocardial infarction (STEMI) of anterolateral wall ([place]) 05/16/2015  . Encounter for therapeutic drug monitoring 07/19/2013  . 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 Aortic valve replaced 01/25/2011  . Long term current use of anticoagulant therapy 06/25/2010  . INFECTIOUS DIARRHEA 03/20/2008  . DIARRHEA-PRESUMED INFECTIOUS 03/20/2008  . URTICARIA 03/13/2008  . FATIGUE 09/12/2007  . DIARRHEA 08/29/2007  . ABDOMINAL PAIN, GENERALIZED, CHRONIC 08/29/2007  . Allergic rhinitis 05/09/2007  . HIATAL HERNIA 05/09/2007  . ARTHRITIS 05/09/2007  . OSTEOPENIA 05/09/2007  . GERD 01/23/2007  . CANCER, ENDOMETRIUM 07/25/2006  .  Dyslipidemia 07/25/2006  . COMMON MIGRAINE 07/25/2006  . Essential hypertension 07/25/2006  . Hx Breast Cancer, IDC, Stage I 07/03/2003   Gabriela Eves, PT, DPT 01/22/2018, 10:18 PM  [facility] [address] [place], [place], [address] Phone: [phone]   Fax:  [phone]  Name: ABIGAEL MOGLE MRN: [id number] Date of Birth: Apr 12, 1945

## 2018-01-24 ENCOUNTER — Ambulatory Visit: Payer: Medicare Other | Admitting: Physical Therapy

## 2018-01-24 ENCOUNTER — Encounter: Payer: Self-pay | Admitting: Physical Therapy

## 2018-01-24 DIAGNOSIS — M6281 Muscle weakness (generalized): Secondary | ICD-10-CM

## 2018-01-24 DIAGNOSIS — R2681 Unsteadiness on feet: Secondary | ICD-10-CM

## 2018-01-24 NOTE — Therapy (Signed)
[facility] [place], [place], [address] Phone: [phone]   Fax:  [phone]  Physical Therapy Treatment  Patient Details  Name: Amanda Wiggins MRN: [id number] Date of Birth: 1946-01-01 Referring Provider (PT): [name],   Encounter Date: 01/24/2018  PT End of Session - 01/24/18 1147    Visit Number  2    Number of Visits  12    Date for PT Re-Evaluation  03/12/18    Authorization Type  Progress note every 10th visit; KX modifier at 15th visit    PT Start Time  1119    PT Stop Time  1208    PT Time Calculation (min)  49 min    Activity Tolerance  Patient tolerated treatment well    Behavior During Therapy  [facility] for tasks assessed/performed       Past Medical History:  Diagnosis Date  . Aortic valve replaced   . Breast cancer ([place]) 2005  . FHx: migraine headaches   . GERD (gastroesophageal reflux disease)   . Hiatal hernia   . Hx Breast Cancer, IDC, Stage I 07/03/2003  . Hx of breast cancer   . Hyperlipidemia   . Hypertension   . Menopausal syndrome   . Osteopenia   . 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 Personal history of radiation therapy   . Status post aortic valve repair     Past Surgical History:  Procedure Laterality Date  . AORTIC VALVE REPLACEMENT  2003  . BREAST BIOPSY    . BREAST LUMPECTOMY Left 2005  . CARDIAC CATHETERIZATION     Ejection Fraction   . CARDIAC CATHETERIZATION N/A 05/16/2015   Procedure: Left Heart Cath and Coronary Angiography;  Surgeon: Leonie Man, MD;  Location: [place] [facility];  Service: Cardiovascular;  Laterality: N/A;  . CARDIAC CATHETERIZATION  05/16/2015   Procedure: Coronary Stent Intervention;  Surgeon: Leonie Man, MD;  Location: [place] [facility];  Service: Cardiovascular;;  . CARDIAC CATHETERIZATION N/A 05/19/2015   Procedure: Left Heart Cath and Coronary Angiography;  Surgeon: Troy Sine, MD;  Location: [place] [facility];   Service: Cardiovascular;  Laterality: N/A;  . CARDIAC CATHETERIZATION N/A 05/19/2015   Procedure: Coronary Stent Intervention;  Surgeon: Troy Sine, MD;  Location: [place] [facility];  Service: Cardiovascular;  Laterality: N/A;  . HIATAL HERNIA REPAIR    . HIATAL HERNIA REPAIR  05/11/2007  . hysterectomy, endometiral cancer  2001  . lummpectomy breast cancer  07/03/2003  . nissan fundoplicaiton  [id number]   post-op hematoma on heparin   . right shouler surgery      There were no vitals filed for this visit.  Subjective Assessment - 01/24/18 1143    Subjective  Patient arrived with weakness compliants in bil LE today and wants to get stronger    Pertinent History  HTN, history of cancer, aortic valve replacement, 2 heart attacks 2017    Limitations  Standing;House hold activities    Patient Stated Goals  improve [facility]    Currently in Pain?  No/denies                       [facility] Adult PT Treatment/Exercise - 01/24/18 0001      Exercises   Exercises  Lumbar;Knee/Hip      Lumbar Exercises: Aerobic   Nustep  43min L4 UE/LE, monitored      Knee/Hip  Exercises: Standing   Heel Raises Limitations  doing heel lifts at home    Hip Abduction  Stengthening;Both;1 set;10 reps;Knee straight    Hip Extension  Stengthening;Both;1 set;10 reps;Knee straight      Knee/Hip Exercises: Seated   Long Arc Quad  Strengthening;Both;3 sets;10 reps;Weights    Long Arc Quad Weight  3 lbs.    Hamstring Curl  Strengthening;Both;20 reps    Hamstring Limitations  Red t-band      Knee/Hip Exercises: Supine   Bridges  Strengthening;Both;1 set;10 reps    Straight Leg Raises  Strengthening;Both;1 set;10 reps    Other Supine Knee/Hip Exercises  clamshell red t-band x20    Other Supine Knee/Hip Exercises  ball squeeze x30          Balance Exercises - 01/24/18 1145      Balance Exercises: Standing   Standing Eyes Opened  Narrow base of support (BOS);Wide  (BOA);Foam/compliant surface;Time   20min   Rockerboard  Anterior/posterior   x32min   Step Ups  Forward;6 inch   x10 no UE support   Sit to Stand Time  x10        PT Education - 01/24/18 1203    Education Details  HEP    Person(s) Educated  Patient    Methods  Explanation;Demonstration;Handout    Comprehension  Verbalized understanding;Returned demonstration          PT Long Term Goals - 01/24/18 1147      PT LONG TERM GOAL #1   Title  Patient will be independent with HEP    Time  6    Period  Weeks    Status  On-going      PT LONG TERM GOAL #2   Title  Patient will demonstrate 4/5 bilateral knee and hip MMT to improve stability during functional tasks.    Time  6    Period  Weeks    Status  On-going      PT LONG TERM GOAL #3   Title  Patient will demonstrate 14 seconds on 5x sit ot stand test to improve her functional LE strength.     Time  6    Period  Weeks    Status  On-going      PT LONG TERM GOAL #4   Title  Patient will demonstrate 56/56 on Berg  Balance Scale to decrease risk of falls.    Time  6    Period  Weeks    Status  On-going            Plan - 01/24/18 1208    Clinical Impression Statement  Patient tolerated treatment well today. Patient able to progress with exercises today. Today focused on low reps for first treatment. HEP provided for initial exercises lower level. Patient doing heel lifts, marching and side stepping at home. Patient feels more weakness in LE than balance issues and would like to get stronger to be able to squat to floor and get back up. Goals ongoing at this time.     Rehab Potential  Good    PT Frequency  2x / week    PT Duration  6 weeks    PT Treatment/Interventions  ADLs/Self Care Home Management;Electrical Stimulation;Cryotherapy;Iontophoresis 4mg /ml Dexamethasone;Moist Heat;Stair training;Gait training;Neuromuscular re-education;Therapeutic exercise;Therapeutic activities;Vestibular;Manual techniques;Passive range  of motion    PT Next Visit Plan  cont with POC for Nustep, LE strengthening, balance activities (Focus on LE strengthening) progress per tolerance    Consulted and Agree with Plan  of Care  Patient       Patient will benefit from skilled therapeutic intervention in order to improve the following deficits and impairments:  Pain, Decreased activity tolerance, Decreased endurance, Decreased range of motion, Decreased strength  Visit Diagnosis: Muscle weakness (generalized)  Unsteadiness on feet     Problem List Patient Active Problem List   Diagnosis Date Noted  . Chronic systolic CHF (congestive heart failure) ([place]) 05/23/2015  . CAD S/P urgent PCI 05/16/15 05/19/2015  . Cardiomyopathy, ischemic 05/19/2015  . ST elevation (STEMI) myocardial infarction involving left anterior descending coronary artery ([place])   . Acute ST elevation myocardial infarction (STEMI) of anterolateral wall ([place]) 05/16/2015  . Encounter for therapeutic drug monitoring 07/19/2013  . 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 Aortic valve replaced 01/25/2011  . Long term current use of anticoagulant therapy 06/25/2010  . INFECTIOUS DIARRHEA 03/20/2008  . DIARRHEA-PRESUMED INFECTIOUS 03/20/2008  . URTICARIA 03/13/2008  . FATIGUE 09/12/2007  . DIARRHEA 08/29/2007  . ABDOMINAL PAIN, GENERALIZED, CHRONIC 08/29/2007  . Allergic rhinitis 05/09/2007  . HIATAL HERNIA 05/09/2007  . ARTHRITIS 05/09/2007  . OSTEOPENIA 05/09/2007  . GERD 01/23/2007  . CANCER, ENDOMETRIUM 07/25/2006  . Dyslipidemia 07/25/2006  . COMMON MIGRAINE 07/25/2006  . Essential hypertension 07/25/2006  . Hx Breast Cancer, IDC, Stage I 07/03/2003    Vola Beneke P, PTA 01/24/2018, 12:13 PM  [facility] [place], [place], [address] Phone: [phone]   Fax:  [phone]  Name: RANELL FINELLI MRN: [id number] Date of Birth: [birth year]

## 2018-01-24 NOTE — Patient Instructions (Signed)
    Bridging   Slowly raise buttocks from floor, keeping stomach tight. Repeat _10___ times per set. Do __2__ sets per session. Do __2__ sessions per day.   Straight Leg Raise   Tighten stomach and slowly raise locked right leg __4__ inches from floor. Repeat __10-30__ times per set. Do __2__ sets per session. Do __2__ sessions per day.     ABDUCTION: Standing (Active)   Stand, feet flat. Lift right leg out to side. Use _0__ lbs. Complete __10-30_ repetitions. Perform __2_ sessions per day.      Hip abduction   While sitting with good posture, tie theraband around knees and pull apart. Slowly resume starting position. x30 1-2 x day

## 2018-01-29 ENCOUNTER — Ambulatory Visit: Payer: Medicare Other | Admitting: Physical Therapy

## 2018-01-29 ENCOUNTER — Ambulatory Visit: Payer: Medicare Other | Admitting: *Deleted

## 2018-01-29 ENCOUNTER — Encounter: Payer: Self-pay | Admitting: Physical Therapy

## 2018-01-29 DIAGNOSIS — Z952 Presence of prosthetic heart valve: Secondary | ICD-10-CM | POA: Diagnosis not present

## 2018-01-29 DIAGNOSIS — R2681 Unsteadiness on feet: Secondary | ICD-10-CM

## 2018-01-29 DIAGNOSIS — Z5181 Encounter for therapeutic drug level monitoring: Secondary | ICD-10-CM | POA: Diagnosis not present

## 2018-01-29 DIAGNOSIS — M6281 Muscle weakness (generalized): Secondary | ICD-10-CM | POA: Diagnosis not present

## 2018-01-29 LAB — POCT INR: INR: 2.9 (ref 2.0–3.0)

## 2018-01-29 NOTE — Patient Instructions (Signed)
Description   Continue taking 1/2 tablet daily except 1 tablet on Sundays, Tuesdays, and Thursdays. Recheck in 4 weeks. [facility] [phone]

## 2018-01-29 NOTE — Therapy (Signed)
[facility] [place], [place], [address] Phone: [phone]   Fax:  [phone]  Physical Therapy Treatment  Patient Details  Name: Amanda Wiggins MRN: [id number] Date of Birth: 10-Sep-1945 Referring Provider (PT): [name],   Encounter Date: 01/29/2018  PT End of Session - 01/29/18 1326    Visit Number  3    Number of Visits  12    Date for PT Re-Evaluation  03/12/18    Authorization Type  Progress note every 10th visit; KX modifier at 15th visit    PT Start Time  1301    PT Stop Time  1345    PT Time Calculation (min)  44 min    Activity Tolerance  Patient tolerated treatment well    Behavior During Therapy  [facility] for tasks assessed/performed       Past Medical History:  Diagnosis Date  . Aortic valve replaced   . Breast cancer ([place]) 2005  . FHx: migraine headaches   . GERD (gastroesophageal reflux disease)   . Hiatal hernia   . Hx Breast Cancer, IDC, Stage I 07/03/2003  . Hx of breast cancer   . Hyperlipidemia   . Hypertension   . Menopausal syndrome   . Osteopenia   . 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 Personal history of radiation therapy   . Status post aortic valve repair     Past Surgical History:  Procedure Laterality Date  . AORTIC VALVE REPLACEMENT  2003  . BREAST BIOPSY    . BREAST LUMPECTOMY Left 2005  . CARDIAC CATHETERIZATION     Ejection Fraction   . CARDIAC CATHETERIZATION N/A 05/16/2015   Procedure: Left Heart Cath and Coronary Angiography;  Surgeon: Leonie Man, MD;  Location: [place] [facility];  Service: Cardiovascular;  Laterality: N/A;  . CARDIAC CATHETERIZATION  05/16/2015   Procedure: Coronary Stent Intervention;  Surgeon: Leonie Man, MD;  Location: [place] [facility];  Service: Cardiovascular;;  . CARDIAC CATHETERIZATION N/A 05/19/2015   Procedure: Left Heart Cath and Coronary Angiography;  Surgeon: Troy Sine, MD;  Location: [place] [facility];   Service: Cardiovascular;  Laterality: N/A;  . CARDIAC CATHETERIZATION N/A 05/19/2015   Procedure: Coronary Stent Intervention;  Surgeon: Troy Sine, MD;  Location: [place] [facility];  Service: Cardiovascular;  Laterality: N/A;  . HIATAL HERNIA REPAIR    . HIATAL HERNIA REPAIR  05/11/2007  . hysterectomy, endometiral cancer  2001  . lummpectomy breast cancer  07/03/2003  . nissan fundoplicaiton  [id number]   post-op hematoma on heparin   . right shouler surgery      There were no vitals filed for this visit.  Subjective Assessment - 01/29/18 1304    Subjective  Patient arrived feeling well and did god after last treatment    Pertinent History  HTN, history of cancer, aortic valve replacement, 2 heart attacks 2017    Limitations  Standing;House hold activities    Patient Stated Goals  improve [facility]    Currently in Pain?  No/denies                       [facility] Adult PT Treatment/Exercise - 01/29/18 0001      Exercises   Exercises  Lumbar;Knee/Hip      Lumbar Exercises: Aerobic   Nustep  24min L4 UE/LE, monitored      Lumbar Exercises: Seated  Other Seated Lumbar Exercises  seated core activation with rows yellow t-band      Knee/Hip Exercises: Seated   Long Arc Quad  Strengthening;Both;3 sets;10 reps;Weights    Long Arc Quad Weight  3 lbs.    Hamstring Curl  Strengthening;Both;20 reps    Hamstring Limitations  Red t-band      Knee/Hip Exercises: Supine   Short Arc Quad Sets  Strengthening;Both;3 sets;10 reps    Short Arc Quad Sets Limitations  3#    Bridges with Clamshell  Strengthening;Both;20 reps   red t-band   Straight Leg Raises  Strengthening;Both;10 reps;3 sets    Other Supine Knee/Hip Exercises  clamshell red t-band x20    Other Supine Knee/Hip Exercises  ball squeeze 3sec x30 reps                  PT Long Term Goals - 01/24/18 1147      PT LONG TERM GOAL #1   Title  Patient will be independent with  HEP    Time  6    Period  Weeks    Status  On-going      PT LONG TERM GOAL #2   Title  Patient will demonstrate 4/5 bilateral knee and hip MMT to improve stability during functional tasks.    Time  6    Period  Weeks    Status  On-going      PT LONG TERM GOAL #3   Title  Patient will demonstrate 14 seconds on 5x sit ot stand test to improve her functional LE strength.     Time  6    Period  Weeks    Status  On-going      PT LONG TERM GOAL #4   Title  Patient will demonstrate 56/56 on Berg  Balance Scale to decrease risk of falls.    Time  6    Period  Weeks    Status  On-going            Plan - 01/29/18 1347    Clinical Impression Statement  Patient tolerated treatment well today. Today focused on LE and core strengthening. Patient doing well with HEP thus far and doing daily. Patient reported some improvement already and noticed when standing not feeling off balanced as usual. Goals progressing.     Rehab Potential  Good    PT Frequency  2x / week    PT Duration  6 weeks    PT Treatment/Interventions  ADLs/Self Care Home Management;Electrical Stimulation;Cryotherapy;Iontophoresis 4mg /ml Dexamethasone;Moist Heat;Stair training;Gait training;Neuromuscular re-education;Therapeutic exercise;Therapeutic activities;Vestibular;Manual techniques;Passive range of motion    PT Next Visit Plan  cont with POC for Nustep, LE strengthening, balance activities (Focus on LE strengthening) progress per tolerance    Consulted and Agree with Plan of Care  Patient       Patient will benefit from skilled therapeutic intervention in order to improve the following deficits and impairments:  Pain, Decreased activity tolerance, Decreased endurance, Decreased range of motion, Decreased strength  Visit Diagnosis: Muscle weakness (generalized)  Unsteadiness on feet     Problem List Patient Active Problem List   Diagnosis Date Noted  . Chronic systolic CHF (congestive heart failure) ([place])  05/23/2015  . CAD S/P urgent PCI 05/16/15 05/19/2015  . Cardiomyopathy, ischemic 05/19/2015  . ST elevation (STEMI) myocardial infarction involving left anterior descending coronary artery ([place])   . Acute ST elevation myocardial infarction (STEMI) of anterolateral wall ([place]) 05/16/2015  . Encounter for therapeutic drug monitoring  07/19/2013  . 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 Aortic valve replaced 01/25/2011  . Long term current use of anticoagulant therapy 06/25/2010  . INFECTIOUS DIARRHEA 03/20/2008  . DIARRHEA-PRESUMED INFECTIOUS 03/20/2008  . URTICARIA 03/13/2008  . FATIGUE 09/12/2007  . DIARRHEA 08/29/2007  . ABDOMINAL PAIN, GENERALIZED, CHRONIC 08/29/2007  . Allergic rhinitis 05/09/2007  . HIATAL HERNIA 05/09/2007  . ARTHRITIS 05/09/2007  . OSTEOPENIA 05/09/2007  . GERD 01/23/2007  . CANCER, ENDOMETRIUM 07/25/2006  . Dyslipidemia 07/25/2006  . COMMON MIGRAINE 07/25/2006  . Essential hypertension 07/25/2006  . Hx Breast Cancer, IDC, Stage I 07/03/2003    Tacori Kvamme P, PTA 01/29/2018, 1:53 PM  [facility] [address] [place], [place], [address] Phone: [phone]   Fax:  [phone]  Name: MILESSA HOGAN MRN: [id number] Date of Birth: 1946-01-20

## 2018-02-05 ENCOUNTER — Ambulatory Visit: Payer: Medicare Other | Attending: Family Medicine | Admitting: Physical Therapy

## 2018-02-05 ENCOUNTER — Encounter: Payer: Self-pay | Admitting: Physical Therapy

## 2018-02-05 DIAGNOSIS — M6281 Muscle weakness (generalized): Secondary | ICD-10-CM

## 2018-02-05 DIAGNOSIS — R2681 Unsteadiness on feet: Secondary | ICD-10-CM

## 2018-02-05 NOTE — Therapy (Signed)
[facility] [place], [place], [address] Phone: [phone]   Fax:  [phone]  Physical Therapy Treatment  Patient Details  Name: Amanda Wiggins MRN: [id number] Date of Birth: 08-05-1945 Referring Provider (PT): [name],   Encounter Date: 02/05/2018  PT End of Session - 02/05/18 1305    Visit Number  4    Number of Visits  12    Date for PT Re-Evaluation  03/12/18    Authorization Type  Progress note every 10th visit; KX modifier at 15th visit    PT Start Time  1300    PT Stop Time  1344    PT Time Calculation (min)  44 min    Activity Tolerance  Patient tolerated treatment well    Behavior During Therapy  [facility] for tasks assessed/performed       Past Medical History:  Diagnosis Date  . Aortic valve replaced   . Breast cancer ([place]) 2005  . FHx: migraine headaches   . GERD (gastroesophageal reflux disease)   . Hiatal hernia   . Hx Breast Cancer, IDC, Stage I 07/03/2003  . Hx of breast cancer   . Hyperlipidemia   . Hypertension   . Menopausal syndrome   . Osteopenia   . 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 Personal history of radiation therapy   . Status post aortic valve repair     Past Surgical History:  Procedure Laterality Date  . AORTIC VALVE REPLACEMENT  2003  . BREAST BIOPSY    . BREAST LUMPECTOMY Left 2005  . CARDIAC CATHETERIZATION     Ejection Fraction   . CARDIAC CATHETERIZATION N/A 05/16/2015   Procedure: Left Heart Cath and Coronary Angiography;  Surgeon: Leonie Man, MD;  Location: [place] [facility];  Service: Cardiovascular;  Laterality: N/A;  . CARDIAC CATHETERIZATION  05/16/2015   Procedure: Coronary Stent Intervention;  Surgeon: Leonie Man, MD;  Location: [place] [facility];  Service: Cardiovascular;;  . CARDIAC CATHETERIZATION N/A 05/19/2015   Procedure: Left Heart Cath and Coronary Angiography;  Surgeon: Troy Sine, MD;  Location: [place] [facility];   Service: Cardiovascular;  Laterality: N/A;  . CARDIAC CATHETERIZATION N/A 05/19/2015   Procedure: Coronary Stent Intervention;  Surgeon: Troy Sine, MD;  Location: [place] [facility];  Service: Cardiovascular;  Laterality: N/A;  . HIATAL HERNIA REPAIR    . HIATAL HERNIA REPAIR  05/11/2007  . hysterectomy, endometiral cancer  2001  . lummpectomy breast cancer  07/03/2003  . nissan fundoplicaiton  [id number]   post-op hematoma on heparin   . right shouler surgery      There were no vitals filed for this visit.  Subjective Assessment - 02/05/18 1304    Subjective  Patient reported feeling good and has been compliant with HEP    Pertinent History  HTN, history of cancer, aortic valve replacement, 2 heart attacks 2017    Limitations  Standing;House hold activities    Patient Stated Goals  improve [facility]    Currently in Pain?  No/denies         [facility] PT Assessment - 02/05/18 0001      Assessment   Medical Diagnosis  Balance problem                   [facility] Adult PT Treatment/Exercise - 02/05/18 0001      Exercises   Exercises  Knee/Hip  Lumbar Exercises: Aerobic   Nustep  15 min L4 UE/LE, monitored      Knee/Hip Exercises: Standing   Hip Abduction  Stengthening;Both;2 sets;10 reps;Knee straight    Forward Step Up  Both;1 set;10 reps;Hand Hold: 2;Step Height: 4"      Knee/Hip Exercises: Seated   Ball Squeeze  5"x20    Clamshell with TheraBand  Red   2x10   Hamstring Curl  Strengthening;Both;20 reps    Hamstring Limitations  Red t-band          Balance Exercises - 02/05/18 1315      Balance Exercises: Standing   Standing Eyes Opened  Narrow base of support (BOS);1 rep   with ball toss 2x10   Rockerboard  Anterior/posterior;Other time (comment);Lateral   x3 mins   Balance Beam  foward and bwd tandem walking with UE support x2 minutes    Sidestepping  Other (comment);Foam/compliant support;Upper extremity support   x2  minutes            PT Long Term Goals - 01/24/18 1147      PT LONG TERM GOAL #1   Title  Patient will be independent with HEP    Time  6    Period  Weeks    Status  On-going      PT LONG TERM GOAL #2   Title  Patient will demonstrate 4/5 bilateral knee and hip MMT to improve stability during functional tasks.    Time  6    Period  Weeks    Status  On-going      PT LONG TERM GOAL #3   Title  Patient will demonstrate 14 seconds on 5x sit ot stand test to improve her functional LE strength.     Time  6    Period  Weeks    Status  On-going      PT LONG TERM GOAL #4   Title  Patient will demonstrate 56/56 on Berg  Balance Scale to decrease risk of falls.    Time  6    Period  Weeks    Status  On-going            Plan - 02/05/18 1424    Clinical Impression Statement  Patient was able to tolerate progression of treatment well with some reports of muscle fatigue. Patient noted with good use of ankle strategies to maintain balance with intermittent use of UEs for support. Patient noted with improvements since start of therapy but reports still having unsteadiness with eyes closed, especially with showering. Add eyes closed balance activities next visit.     Clinical Presentation  Stable    Clinical Decision Making  Low    Rehab Potential  Good    PT Frequency  2x / week    PT Duration  6 weeks    PT Treatment/Interventions  ADLs/Self Care Home Management;Electrical Stimulation;Cryotherapy;Iontophoresis 4mg /ml Dexamethasone;Moist Heat;Stair training;Gait training;Neuromuscular re-education;Therapeutic exercise;Therapeutic activities;Vestibular;Manual techniques;Passive range of motion    PT Next Visit Plan  Add eyes closed balance activities and progress when ready. cont with POC for Nustep, LE strengthening, balance activities (Focus on LE strengthening) progress per tolerance    Consulted and Agree with Plan of Care  Patient       Patient will benefit from skilled  therapeutic intervention in order to improve the following deficits and impairments:  Pain, Decreased activity tolerance, Decreased endurance, Decreased range of motion, Decreased strength  Visit Diagnosis: Muscle weakness (generalized)  Unsteadiness on feet  Problem List Patient Active Problem List   Diagnosis Date Noted  . Chronic systolic CHF (congestive heart failure) ([place]) 05/23/2015  . CAD S/P urgent PCI 05/16/15 05/19/2015  . Cardiomyopathy, ischemic 05/19/2015  . ST elevation (STEMI) myocardial infarction involving left anterior descending coronary artery ([place])   . Acute ST elevation myocardial infarction (STEMI) of anterolateral wall ([place]) 05/16/2015  . Encounter for therapeutic drug monitoring 07/19/2013  . 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 Aortic valve replaced 01/25/2011  . Long term current use of anticoagulant therapy 06/25/2010  . INFECTIOUS DIARRHEA 03/20/2008  . DIARRHEA-PRESUMED INFECTIOUS 03/20/2008  . URTICARIA 03/13/2008  . FATIGUE 09/12/2007  . DIARRHEA 08/29/2007  . ABDOMINAL PAIN, GENERALIZED, CHRONIC 08/29/2007  . Allergic rhinitis 05/09/2007  . HIATAL HERNIA 05/09/2007  . ARTHRITIS 05/09/2007  . OSTEOPENIA 05/09/2007  . GERD 01/23/2007  . CANCER, ENDOMETRIUM 07/25/2006  . Dyslipidemia 07/25/2006  . COMMON MIGRAINE 07/25/2006  . Essential hypertension 07/25/2006  . Hx Breast Cancer, IDC, Stage I 07/03/2003   Gabriela Eves, PT, DPT 02/05/2018, 8:51 PM  [facility] [address] [place], [place], [address] Phone: [phone]   Fax:  [phone]  Name: Amanda Wiggins MRN: [id number] Date of Birth: Feb 25, 1946

## 2018-02-08 ENCOUNTER — Ambulatory Visit: Payer: Medicare Other | Admitting: Physical Therapy

## 2018-02-08 ENCOUNTER — Encounter: Payer: Self-pay | Admitting: Physical Therapy

## 2018-02-08 DIAGNOSIS — R2681 Unsteadiness on feet: Secondary | ICD-10-CM

## 2018-02-08 DIAGNOSIS — M6281 Muscle weakness (generalized): Secondary | ICD-10-CM | POA: Diagnosis not present

## 2018-02-08 NOTE — Therapy (Signed)
[facility] [place], [place], [address] Phone: [phone]   Fax:  [phone]  Physical Therapy Treatment  Patient Details  Name: Amanda Wiggins MRN: [id number] Date of Birth: 03-17-1945 Referring Provider (PT): [name],   Encounter Date: 02/08/2018  PT End of Session - 02/08/18 1359    Visit Number  5    Number of Visits  12    Date for PT Re-Evaluation  03/12/18    Authorization Type  Progress note every 10th visit; KX modifier at 15th visit    PT Start Time  1345    PT Stop Time  1433    PT Time Calculation (min)  48 min    Activity Tolerance  Patient tolerated treatment well    Behavior During Therapy  [facility] for tasks assessed/performed       Past Medical History:  Diagnosis Date  . Aortic valve replaced   . Breast cancer ([place]) 2005  . FHx: migraine headaches   . GERD (gastroesophageal reflux disease)   . Hiatal hernia   . Hx Breast Cancer, IDC, Stage I 07/03/2003  . Hx of breast cancer   . Hyperlipidemia   . Hypertension   . Menopausal syndrome   . Osteopenia   . 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 Personal history of radiation therapy   . Status post aortic valve repair     Past Surgical History:  Procedure Laterality Date  . AORTIC VALVE REPLACEMENT  2003  . BREAST BIOPSY    . BREAST LUMPECTOMY Left 2005  . CARDIAC CATHETERIZATION     Ejection Fraction   . CARDIAC CATHETERIZATION N/A 05/16/2015   Procedure: Left Heart Cath and Coronary Angiography;  Surgeon: Leonie Man, MD;  Location: [place] [facility];  Service: Cardiovascular;  Laterality: N/A;  . CARDIAC CATHETERIZATION  05/16/2015   Procedure: Coronary Stent Intervention;  Surgeon: Leonie Man, MD;  Location: [place] [facility];  Service: Cardiovascular;;  . CARDIAC CATHETERIZATION N/A 05/19/2015   Procedure: Left Heart Cath and Coronary Angiography;  Surgeon: Troy Sine, MD;  Location: [place] [facility];   Service: Cardiovascular;  Laterality: N/A;  . CARDIAC CATHETERIZATION N/A 05/19/2015   Procedure: Coronary Stent Intervention;  Surgeon: Troy Sine, MD;  Location: [place] [facility];  Service: Cardiovascular;  Laterality: N/A;  . HIATAL HERNIA REPAIR    . HIATAL HERNIA REPAIR  05/11/2007  . hysterectomy, endometiral cancer  2001  . lummpectomy breast cancer  07/03/2003  . nissan fundoplicaiton  [id number]   post-op hematoma on heparin   . right shouler surgery      There were no vitals filed for this visit.  Subjective Assessment - 02/08/18 1359    Subjective  Patient reported feeling fine.     Pertinent History  HTN, history of cancer, aortic valve replacement, 2 heart attacks 2017    Limitations  Standing;House hold activities    Patient Stated Goals  improve balance and return to exercising at the [facility] PT Assessment - 02/08/18 0001      Assessment   Medical Diagnosis  Balance problem                   [facility] Adult PT Treatment/Exercise - 02/08/18 0001      Exercises   Exercises  Knee/Hip      Lumbar Exercises: Aerobic   Nustep  15 min L4 UE/LE,  monitored      Knee/Hip Exercises: Supine   Bridges with Ball Squeeze  AROM;Both;20 reps   3" hold   Other Supine Knee/Hip Exercises  clamshell red t-band x20    Other Supine Knee/Hip Exercises  marching with draw in x20          Balance Exercises - 02/08/18 1439      Balance Exercises: Standing   Standing Eyes Closed  Narrow base of support (BOS);Solid surface;5 reps;Time   1 minute with Foward and lateral reaching (5 min total)   Rockerboard  Anterior/posterior;Lateral;Other time (comment);Intermittent UE support   x3 minutes each   Other Standing Exercises  foam balancing with mini squats             PT Long Term Goals - 01/24/18 1147      PT LONG TERM GOAL #1   Title  Patient will be independent with HEP    Time  6    Period  Weeks    Status  On-going      PT LONG TERM GOAL #2    Title  Patient will demonstrate 4/5 bilateral knee and hip MMT to improve stability during functional tasks.    Time  6    Period  Weeks    Status  On-going      PT LONG TERM GOAL #3   Title  Patient will demonstrate 14 seconds on 5x sit ot stand test to improve her functional LE strength.     Time  6    Period  Weeks    Status  On-going      PT LONG TERM GOAL #4   Title  Patient will demonstrate 56/56 on Berg  Balance Scale to decrease risk of falls.    Time  6    Period  Weeks    Status  On-going            Plan - 02/08/18 1447    Clinical Impression Statement  Patient was able to tolerate treatment well and was able to complete advanced balance activities with close supervision. Patient was able to perform eyes closed activities and was able to demonstrate strong use of ankle strategies to maintain balance. Patient noted with minimal sway. Patient noted with difficulties with coming to standing from a squat position. Add exercises to help strengthen muscles next visit.     Clinical Presentation  Stable    Clinical Decision Making  Low    Rehab Potential  Good    PT Frequency  2x / week    PT Duration  6 weeks    PT Treatment/Interventions  ADLs/Self Care Home Management;Electrical Stimulation;Cryotherapy;Iontophoresis 4mg /ml Dexamethasone;Moist Heat;Stair training;Gait training;Neuromuscular re-education;Therapeutic exercise;Therapeutic activities;Vestibular;Manual techniques;Passive range of motion    PT Next Visit Plan  Add eyes closed balance activities and progress when ready. cont with POC for Nustep, LE strengthening, balance activities (Focus on LE strengthening) progress per tolerance    PT Home Exercise Plan  see pt education section    Consulted and Agree with Plan of Care  Patient       Patient will benefit from skilled therapeutic intervention in order to improve the following deficits and impairments:  Pain, Decreased activity tolerance, Decreased endurance,  Decreased range of motion, Decreased strength  Visit Diagnosis: Muscle weakness (generalized)  Unsteadiness on feet     Problem List Patient Active Problem List   Diagnosis Date Noted  . Chronic systolic CHF (congestive heart failure) ([place]) 05/23/2015  . CAD S/P  urgent PCI 05/16/15 05/19/2015  . Cardiomyopathy, ischemic 05/19/2015  . ST elevation (STEMI) myocardial infarction involving left anterior descending coronary artery ([place])   . Acute ST elevation myocardial infarction (STEMI) of anterolateral wall ([place]) 05/16/2015  . Encounter for therapeutic drug monitoring 07/19/2013  . 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 Aortic valve replaced 01/25/2011  . Long term current use of anticoagulant therapy 06/25/2010  . INFECTIOUS DIARRHEA 03/20/2008  . DIARRHEA-PRESUMED INFECTIOUS 03/20/2008  . URTICARIA 03/13/2008  . FATIGUE 09/12/2007  . DIARRHEA 08/29/2007  . ABDOMINAL PAIN, GENERALIZED, CHRONIC 08/29/2007  . Allergic rhinitis 05/09/2007  . HIATAL HERNIA 05/09/2007  . ARTHRITIS 05/09/2007  . OSTEOPENIA 05/09/2007  . GERD 01/23/2007  . CANCER, ENDOMETRIUM 07/25/2006  . Dyslipidemia 07/25/2006  . COMMON MIGRAINE 07/25/2006  . Essential hypertension 07/25/2006  . Hx Breast Cancer, IDC, Stage I 07/03/2003    Gabriela Eves, PT, DPT 02/08/2018, 2:51 PM  [facility] [address] [place], [place], [address] Phone: [phone]   Fax:  [phone]  Name: REILLEY VALENTINE MRN: [id number] Date of Birth: 1946/01/22

## 2018-02-12 ENCOUNTER — Inpatient Hospital Stay: Payer: Medicare Other | Attending: Hematology & Oncology | Admitting: Hematology & Oncology

## 2018-02-12 ENCOUNTER — Other Ambulatory Visit: Payer: Self-pay

## 2018-02-12 ENCOUNTER — Ambulatory Visit: Payer: Medicare Other | Admitting: Physical Therapy

## 2018-02-12 ENCOUNTER — Inpatient Hospital Stay: Payer: Medicare Other

## 2018-02-12 ENCOUNTER — Other Ambulatory Visit: Payer: Self-pay | Admitting: Cardiovascular Disease

## 2018-02-12 ENCOUNTER — Encounter: Payer: Self-pay | Admitting: Physical Therapy

## 2018-02-12 DIAGNOSIS — Z952 Presence of prosthetic heart valve: Secondary | ICD-10-CM | POA: Diagnosis not present

## 2018-02-12 DIAGNOSIS — Z79811 Long term (current) use of aromatase inhibitors: Secondary | ICD-10-CM | POA: Insufficient documentation

## 2018-02-12 DIAGNOSIS — Z7901 Long term (current) use of anticoagulants: Secondary | ICD-10-CM | POA: Diagnosis not present

## 2018-02-12 DIAGNOSIS — R2681 Unsteadiness on feet: Secondary | ICD-10-CM

## 2018-02-12 DIAGNOSIS — M6281 Muscle weakness (generalized): Secondary | ICD-10-CM | POA: Diagnosis not present

## 2018-02-12 DIAGNOSIS — C50912 Malignant neoplasm of unspecified site of left female breast: Secondary | ICD-10-CM | POA: Diagnosis present

## 2018-02-12 DIAGNOSIS — Z853 Personal history of malignant neoplasm of breast: Secondary | ICD-10-CM

## 2018-02-12 DIAGNOSIS — Z79899 Other long term (current) drug therapy: Secondary | ICD-10-CM | POA: Insufficient documentation

## 2018-02-12 LAB — CBC WITH DIFFERENTIAL (CANCER CENTER ONLY)
Abs Immature Granulocytes: 0.01 10*3/uL (ref 0.00–0.07)
Basophils Absolute: 0 10*3/uL (ref 0.0–0.1)
Basophils Relative: 0 %
Eosinophils Absolute: 0.1 10*3/uL (ref 0.0–0.5)
Eosinophils Relative: 2 %
HEMATOCRIT: 44.3 % (ref 36.0–46.0)
HEMOGLOBIN: 13.6 g/dL (ref 12.0–15.0)
IMMATURE GRANULOCYTES: 0 %
LYMPHS ABS: 1.3 10*3/uL (ref 0.7–4.0)
LYMPHS PCT: 24 %
MCH: 28.2 pg (ref 26.0–34.0)
MCHC: 30.7 g/dL (ref 30.0–36.0)
MCV: 91.7 fL (ref 80.0–100.0)
MONOS PCT: 9 %
Monocytes Absolute: 0.5 10*3/uL (ref 0.1–1.0)
NEUTROS PCT: 65 %
Neutro Abs: 3.5 10*3/uL (ref 1.7–7.7)
Platelet Count: 322 10*3/uL (ref 150–400)
RBC: 4.83 MIL/uL (ref 3.87–5.11)
RDW: 12.6 % (ref 11.5–15.5)
WBC Count: 5.4 10*3/uL (ref 4.0–10.5)
nRBC: 0 % (ref 0.0–0.2)

## 2018-02-12 LAB — CMP (CANCER CENTER ONLY)
ALBUMIN: 4.2 g/dL (ref 3.5–5.0)
ALK PHOS: 50 U/L (ref 38–126)
ALT: 18 U/L (ref 0–44)
ANION GAP: 6 (ref 5–15)
AST: 25 U/L (ref 15–41)
BUN: 16 mg/dL (ref 8–23)
CALCIUM: 9.1 mg/dL (ref 8.9–10.3)
CHLORIDE: 105 mmol/L (ref 98–111)
CO2: 31 mmol/L (ref 22–32)
CREATININE: 0.94 mg/dL (ref 0.44–1.00)
GFR, Estimated: >60 mL/min (ref >60)
GLUCOSE: 130 mg/dL — AB (ref 70–99)
Potassium: 5.1 mmol/L (ref 3.5–5.1)
Sodium: 142 mmol/L (ref 135–145)
Total Bilirubin: 0.4 mg/dL (ref 0.3–1.2)
Total Protein: 6.3 g/dL — ABNORMAL LOW (ref 6.5–8.1)

## 2018-02-12 MED ORDER — TEMAZEPAM 30 MG PO CAPS: 30.0000 mg | ORAL_CAPSULE | Freq: Every evening | ORAL | 2 refills | Status: DC | PRN

## 2018-02-12 NOTE — Progress Notes (Signed)
Hematology and Oncology Follow Up Visit  ILLEANA EDICK [id number] Jan 19, 1946 72 y.o. 02/12/2018   Principle Diagnosis:  1. Stage I (T1 N0 M0) ductal carcinoma of the left breast. 2. Mechanical aortic valve.  Current Therapy:    Coumadin-lifelong  Zometa 5 mg IV q. Year - given in June 2020     Interim History:  Ms.  Brune is back for followup. She is doing okay.  We last saw her back in June.  She is doing pretty well.  She really has had no complaints.  It was a quiet Thanksgiving.  It sounds like she will have a quiet New Year's and Christmas.  Next year, she will be going on a cruise to the [place].  She is looking forward to this.  She is having no problems with her Coumadin.  Cardiology is following her INR.  She has had no problems with fever.  There is been no rashes.  She has had no change in bowel or bladder habits.  She now is on Praluent for her cholesterol.  She was on Crestor but this was caused her to have weakness in her core muscles.  She is having a hard time with her balance.  When she got off the Crestor, she got a lot better.  She now is doing physical therapy to help with her muscle strength.  Her last mammogram was back in April 2019.  Medications:  Current Outpatient Medications:  .  Alirocumab (PRALUENT) 75 MG/ML SOPN, Inject 1 pen into the skin every 14 (fourteen) days., Disp: 2 pen, Rfl: 11 .  BIOTIN PO, Take 1 tablet by mouth every morning. , Disp: , Rfl:  .  Calcium Carbonate-Vitamin D (CALCIUM 600+D) 600-400 MG-UNIT per tablet, Take 1 tablet by mouth 2 (two) times daily.  , Disp: , Rfl:  .  carvedilol (COREG) 3.125 MG tablet, TAKE 1 TABLET (3.125 MG TOTAL) BY MOUTH 2 (TWO) TIMES DAILY WITH A MEAL., Disp: 180 tablet, Rfl: 2 .  clopidogrel (PLAVIX) 75 MG tablet, TAKE 1 TABLET (75 MG TOTAL) BY MOUTH DAILY WITH BREAKFAST., Disp: 30 tablet, Rfl: 8 .  cycloSPORINE (RESTASIS) 0.05 % ophthalmic emulsion, Place 1 drop into both eyes 2 (two) times daily  as needed (DRY EYE). , Disp: , Rfl:  .  Desoximetasone 0.05 % GEL, Apply 1 application topically as needed (AS NEEDED TO AFFECTED AREA). , Disp: , Rfl:  .  Glucosamine-Chondroit-Vit C-Mn (GLUCOSAMINE CHONDR 1500 COMPLX) CAPS, Take 1 capsule by mouth every morning. , Disp: , Rfl:  .  loratadine (CLARITIN) 10 MG tablet, Take 10 mg by mouth daily.  , Disp: , Rfl:  .  Multiple Vitamin (MULTIVITAMIN) capsule, Take 1 capsule by mouth daily.  , Disp: , Rfl:  .  nitroGLYCERIN (NITROSTAT) 0.4 MG SL tablet, Place 1 tablet (0.4 mg total) under the tongue every 5 (five) minutes as needed for chest pain., Disp: 25 tablet, Rfl: 6 .  pantoprazole (PROTONIX) 40 MG tablet, TAKE 1 TABLET BY MOUTH EVERY DAY, Disp: 30 tablet, Rfl: 11 .  PAZEO 0.7 % SOLN, INSTILL 1 DROP INTO BOTH EYES DAILY AS NEEDED FOR ALLERGIES, Disp: , Rfl: 5 .  temazepam (RESTORIL) 30 MG capsule, TAKE 1 CAPSULE (30 MG TOTAL) BY MOUTH AT BEDTIME AS NEEDED FOR SLEEP., Disp: 30 capsule, Rfl: 2 .  warfarin (COUMADIN) 5 MG tablet, TAKE AS DIRECTED BY COUMADIN CLINIC, Disp: 90 tablet, Rfl: 1  Current Facility-Administered Medications:  .  0.9 %  sodium chloride infusion,  500 mL, Intravenous, Continuous, Gatha Mayer, MD  Allergies:  Allergies  Allergen Reactions  . Meperidine Nausea Only  . Meperidine Hcl Nausea Only  . Sulfa Antibiotics Nausea Only  . Crestor [Rosuvastatin Calcium]     Myalgias on 10mg  and 20mg  daily  . Latex Hives  . Meperidine Hcl Nausea And Vomiting  . Pravastatin     myalgias  . Sulfamethoxazole Nausea And Vomiting    REACTION: unspecified    Past Medical History, Surgical history, Social history, and Family History were reviewed and updated.  Review of Systems: Review of Systems  Constitutional: Negative.   HENT: Negative.   Eyes: Negative.   Cardiovascular: Negative.   Gastrointestinal: Negative.   Genitourinary: Negative.   Musculoskeletal: Negative.   Skin: Negative.   Neurological: Negative.    Endo/Heme/Allergies: Negative.   Psychiatric/Behavioral: Negative.      Physical Exam:  weight is 141 lb 12 oz (64.3 kg). Her oral temperature is 98.4 F (36.9 C). Her blood pressure is 101/57 (abnormal) and her pulse is 68. Her respiration is 20 and oxygen saturation is 98%.   Physical Exam  Constitutional: She is oriented to person, place, and time.  HENT:  Head: Normocephalic and atraumatic.  Mouth/Throat: Oropharynx is clear and moist.  Eyes: Pupils are equal, round, and reactive to light. EOM are normal.  Neck: Normal range of motion.  Cardiovascular: Normal rate, regular rhythm and normal heart sounds.  Pulmonary/Chest: Effort normal and breath sounds normal.  Abdominal: Soft. Bowel sounds are normal.  Musculoskeletal: Normal range of motion. She exhibits no edema, tenderness or deformity.  Lymphadenopathy:    She has no cervical adenopathy.  Neurological: She is alert and oriented to person, place, and time.  Skin: Skin is warm and dry. No rash noted. No erythema.  Psychiatric: She has a normal mood and affect. Her behavior is normal. Judgment and thought content normal.  Vitals reviewed.   Lab Results  Component Value Date   WBC 5.4 02/12/2018   HGB 13.6 02/12/2018   HCT 44.3 02/12/2018   MCV 91.7 02/12/2018   PLT 322 02/12/2018     Chemistry      Component Value Date/Time   NA 142 02/12/2018 0931   NA 141 10/30/2017 0928   NA 147 (H) 02/27/2017 0952   NA 142 02/12/2015 1125   K 5.1 02/12/2018 0931   K 4.3 02/27/2017 0952   K 3.8 02/12/2015 1125   CL 105 02/12/2018 0931   CL 106 02/27/2017 0952   CO2 31 02/12/2018 0931   CO2 30 02/27/2017 0952   CO2 24 02/12/2015 1125   BUN 16 02/12/2018 0931   BUN 16 10/30/2017 0928   BUN 14 02/27/2017 0952   BUN 14.7 02/12/2015 1125   CREATININE 0.94 02/12/2018 0931   CREATININE 0.9 02/27/2017 0952   CREATININE 1.0 02/12/2015 1125      Component Value Date/Time   CALCIUM 9.1 02/12/2018 0931   CALCIUM 9.2  02/27/2017 0952   CALCIUM 10.2 02/12/2015 1125   ALKPHOS 50 02/12/2018 0931   ALKPHOS 55 02/27/2017 0952   ALKPHOS 56 02/12/2015 1125   AST 25 02/12/2018 0931   AST 35 (H) 02/12/2015 1125   ALT 18 02/12/2018 0931   ALT 22 02/27/2017 0952   ALT 39 02/12/2015 1125   BILITOT 0.4 02/12/2018 0931   BILITOT 0.45 02/12/2015 1125       Impression and Plan: Ms. Larivee is 72 year old female with a history of stage I infiltrating  duct carcinoma the left breast. She underwent lumpectomy.  She  had been on Femara. She had radiation. It has been 12  years now.  Everything looks good right now.  I do not see any problems from a malignant point of view.  I do not see any evidence of recurrent breast cancer.  We will go ahead and plan to get her back in 6 months.  We will see her back, we will do her Zometa.  I refilled her Restoril.  Marland KitchenVolanda Napoleon, MD 12/9/201910:45 AM

## 2018-02-12 NOTE — Therapy (Signed)
[facility] [place], [place], [address] Phone: [phone]   Fax:  [phone]  Physical Therapy Treatment  Patient Details  Name: Amanda Wiggins MRN: [id number] Date of Birth: [birth year] Referring Provider (PT): [name],   Encounter Date: 02/12/2018  PT End of Session - 02/12/18 1520    Visit Number  6    Number of Visits  12    Date for PT Re-Evaluation  03/12/18    Authorization Type  Progress note every 10th visit; KX modifier at 15th visit    PT Start Time  1532    PT Stop Time  1614    PT Time Calculation (min)  42 min    Activity Tolerance  Patient tolerated treatment well    Behavior During Therapy  [facility] for tasks assessed/performed       Past Medical History:  Diagnosis Date  . Aortic valve replaced   . Breast cancer ([place]) 2005  . FHx: migraine headaches   . GERD (gastroesophageal reflux disease)   . Hiatal hernia   . Hx Breast Cancer, IDC, Stage I 07/03/2003  . Hx of breast cancer   . Hyperlipidemia   . Hypertension   . Menopausal syndrome   . Osteopenia   . 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 Personal history of radiation therapy   . Status post aortic valve repair     Past Surgical History:  Procedure Laterality Date  . AORTIC VALVE REPLACEMENT  2003  . BREAST BIOPSY    . BREAST LUMPECTOMY Left 2005  . CARDIAC CATHETERIZATION     Ejection Fraction   . CARDIAC CATHETERIZATION N/A 05/16/2015   Procedure: Left Heart Cath and Coronary Angiography;  Surgeon: Leonie Man, MD;  Location: [place] [facility];  Service: Cardiovascular;  Laterality: N/A;  . CARDIAC CATHETERIZATION  05/16/2015   Procedure: Coronary Stent Intervention;  Surgeon: Leonie Man, MD;  Location: [place] [facility];  Service: Cardiovascular;;  . CARDIAC CATHETERIZATION N/A 05/19/2015   Procedure: Left Heart Cath and Coronary Angiography;  Surgeon: Troy Sine, MD;  Location: [place] [facility];   Service: Cardiovascular;  Laterality: N/A;  . CARDIAC CATHETERIZATION N/A 05/19/2015   Procedure: Coronary Stent Intervention;  Surgeon: Troy Sine, MD;  Location: [place] [facility];  Service: Cardiovascular;  Laterality: N/A;  . HIATAL HERNIA REPAIR    . HIATAL HERNIA REPAIR  05/11/2007  . hysterectomy, endometiral cancer  2001  . lummpectomy breast cancer  07/03/2003  . nissan fundoplicaiton  [id number]   post-op hematoma on heparin   . right shouler surgery      There were no vitals filed for this visit.  Subjective Assessment - 02/12/18 1500    Subjective  Patient reported doing well thus far, no faals reported    Pertinent History  HTN, history of cancer, aortic valve replacement, 2 heart attacks 2017    Limitations  Standing;House hold activities    Patient Stated Goals  improve [facility]    Currently in Pain?  No/denies                       [facility] Adult PT Treatment/Exercise - 02/12/18 0001      Lumbar Exercises: Aerobic   Nustep  15 min L4 UE/LE, monitored      Knee/Hip Exercises: Seated   Long Arc Quad  Strengthening;Both;10 reps;Weights;2 sets    Long  Arc Quad Weight  4 lbs.    Clamshell with TheraBand  Red   x30   Hamstring Curl  Strengthening;2 sets;10 reps    Hamstring Limitations  Red t-band          Balance Exercises - 02/12/18 1508      Balance Exercises: Standing   Step Ups  Forward;6 inch   2x10   Sit to Stand Time  x10    Other Standing Exercises  resisted walking with pink XTS all directions             PT Long Term Goals - 01/24/18 1147      PT LONG TERM GOAL #1   Title  Patient will be independent with HEP    Time  6    Period  Weeks    Status  On-going      PT LONG TERM GOAL #2   Title  Patient will demonstrate 4/5 bilateral knee and hip MMT to improve stability during functional tasks.    Time  6    Period  Weeks    Status  On-going      PT LONG TERM GOAL #3   Title  Patient  will demonstrate 14 seconds on 5x sit ot stand test to improve her functional LE strength.     Time  6    Period  Weeks    Status  On-going      PT LONG TERM GOAL #4   Title  Patient will demonstrate 56/56 on Berg  Balance Scale to decrease risk of falls.    Time  6    Period  Weeks    Status  On-going            Plan - 02/12/18 1517    Clinical Impression Statement  Patient tolerated treatment well today and able to progress exercises. Today started resisted walking each direction with more difficulty with side to side. Patient reported no falls yet some episodes of LOB at simes to the side. Goals progressing.    Rehab Potential  Good    PT Frequency  2x / week    PT Duration  6 weeks    PT Treatment/Interventions  ADLs/Self Care Home Management;Electrical Stimulation;Cryotherapy;Iontophoresis 4mg /ml Dexamethasone;Moist Heat;Stair training;Gait training;Neuromuscular re-education;Therapeutic exercise;Therapeutic activities;Vestibular;Manual techniques;Passive range of motion    PT Next Visit Plan  cont with LE strengthening, balance activities (Focus on LE strengthening glut and hips) progress per tolerance    Consulted and Agree with Plan of Care  Patient       Patient will benefit from skilled therapeutic intervention in order to improve the following deficits and impairments:  Pain, Decreased activity tolerance, Decreased endurance, Decreased range of motion, Decreased strength  Visit Diagnosis: Muscle weakness (generalized)  Unsteadiness on feet     Problem List Patient Active Problem List   Diagnosis Date Noted  . Chronic systolic CHF (congestive heart failure) ([place]) 05/23/2015  . CAD S/P urgent PCI 05/16/15 05/19/2015  . Cardiomyopathy, ischemic 05/19/2015  . ST elevation (STEMI) myocardial infarction involving left anterior descending coronary artery ([place])   . Acute ST elevation myocardial infarction (STEMI) of anterolateral wall ([place]) 05/16/2015  . Encounter for  therapeutic drug monitoring 07/19/2013  . 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 Aortic valve replaced 01/25/2011  . Long term current use of anticoagulant therapy 06/25/2010  . INFECTIOUS DIARRHEA 03/20/2008  . DIARRHEA-PRESUMED INFECTIOUS 03/20/2008  . URTICARIA 03/13/2008  . FATIGUE 09/12/2007  . DIARRHEA 08/29/2007  .  ABDOMINAL PAIN, GENERALIZED, CHRONIC 08/29/2007  . Allergic rhinitis 05/09/2007  . HIATAL HERNIA 05/09/2007  . ARTHRITIS 05/09/2007  . OSTEOPENIA 05/09/2007  . GERD 01/23/2007  . CANCER, ENDOMETRIUM 07/25/2006  . Dyslipidemia 07/25/2006  . COMMON MIGRAINE 07/25/2006  . Essential hypertension 07/25/2006  . Hx Breast Cancer, IDC, Stage I 07/03/2003    Bensyn Bornemann P, PTA 02/12/2018, 3:22 PM  [facility] [place], [place], [address] Phone: [phone]   Fax:  [phone]  Name: Amanda Wiggins MRN: [id number] Date of Birth: 28-Sep-1945

## 2018-02-15 ENCOUNTER — Encounter: Payer: Self-pay | Admitting: Physical Therapy

## 2018-02-15 ENCOUNTER — Ambulatory Visit: Payer: Medicare Other | Admitting: Physical Therapy

## 2018-02-15 DIAGNOSIS — M6281 Muscle weakness (generalized): Secondary | ICD-10-CM

## 2018-02-15 DIAGNOSIS — R2681 Unsteadiness on feet: Secondary | ICD-10-CM

## 2018-02-15 NOTE — Therapy (Signed)
[facility] [place], [place], [address] Phone: [phone]   Fax:  [phone]  Physical Therapy Treatment  Patient Details  Name: Amanda Wiggins MRN: [id number] Date of Birth: 17-May-1945 Referring Provider (PT): [name],   Encounter Date: 02/15/2018  PT End of Session - 02/15/18 1036    Visit Number  7    Number of Visits  12    Date for PT Re-Evaluation  03/12/18    Authorization Type  Progress note every 10th visit; KX modifier at 15th visit    PT Start Time  1030    PT Stop Time  1115    PT Time Calculation (min)  45 min    Activity Tolerance  Patient tolerated treatment well    Behavior During Therapy  [facility] for tasks assessed/performed       Past Medical History:  Diagnosis Date  . Aortic valve replaced   . Breast cancer ([place]) 2005  . FHx: migraine headaches   . GERD (gastroesophageal reflux disease)   . Hiatal hernia   . Hx Breast Cancer, IDC, Stage I 07/03/2003  . Hx of breast cancer   . Hyperlipidemia   . Hypertension   . Menopausal syndrome   . Osteopenia   . 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 Personal history of radiation therapy   . Status post aortic valve repair     Past Surgical History:  Procedure Laterality Date  . AORTIC VALVE REPLACEMENT  2003  . BREAST BIOPSY    . BREAST LUMPECTOMY Left 2005  . CARDIAC CATHETERIZATION     Ejection Fraction   . CARDIAC CATHETERIZATION N/A 05/16/2015   Procedure: Left Heart Cath and Coronary Angiography;  Surgeon: Leonie Man, MD;  Location: [place] [facility];  Service: Cardiovascular;  Laterality: N/A;  . CARDIAC CATHETERIZATION  05/16/2015   Procedure: Coronary Stent Intervention;  Surgeon: Leonie Man, MD;  Location: [place] [facility];  Service: Cardiovascular;;  . CARDIAC CATHETERIZATION N/A 05/19/2015   Procedure: Left Heart Cath and Coronary Angiography;  Surgeon: Troy Sine, MD;  Location: [place] [facility];   Service: Cardiovascular;  Laterality: N/A;  . CARDIAC CATHETERIZATION N/A 05/19/2015   Procedure: Coronary Stent Intervention;  Surgeon: Troy Sine, MD;  Location: [place] [facility];  Service: Cardiovascular;  Laterality: N/A;  . HIATAL HERNIA REPAIR    . HIATAL HERNIA REPAIR  05/11/2007  . hysterectomy, endometiral cancer  2001  . lummpectomy breast cancer  07/03/2003  . nissan fundoplicaiton  [id number]   post-op hematoma on heparin   . right shouler surgery      There were no vitals filed for this visit.  Subjective Assessment - 02/15/18 1033    Subjective  Patient reported feeling good and states she's been improving with getting up from the floor with only requiring a couple of fingers to help her up.     Pertinent History  HTN, history of cancer, aortic valve replacement, 2 heart attacks 2017    Limitations  Standing;House hold activities    Patient Stated Goals  improve [facility]    Currently in Pain?  No/denies         [facility] PT Assessment - 02/15/18 0001      Assessment   Medical Diagnosis  Balance problem                   [facility] Adult PT Treatment/Exercise -  02/15/18 0001      Lumbar Exercises: Aerobic   Nustep  15 min L4 UE/LE, monitored      Knee/Hip Exercises: Standing   Functional Squat  2 sets;10 reps    Functional Squat Limitations  chair taps      Knee/Hip Exercises: Seated   Long Arc Quad  Strengthening;Both;10 reps;Weights;2 sets   eccentric control   Long Arc Quad Weight  4 lbs.          Balance Exercises - 02/15/18 1058      Balance Exercises: Standing   Rockerboard  Anterior/posterior;Lateral;Other time (comment);Intermittent UE support   3 minutes each; vertical and horizontal head turns each   Heel Raises Limitations  x20 on airex    Toe Raise Limitations  x20 on airex    Other Standing Exercises  resisted walking with pink XTS lateral             PT Long Term Goals - 01/24/18 1147       PT LONG TERM GOAL #1   Title  Patient will be independent with HEP    Time  6    Period  Weeks    Status  On-going      PT LONG TERM GOAL #2   Title  Patient will demonstrate 4/5 bilateral knee and hip MMT to improve stability during functional tasks.    Time  6    Period  Weeks    Status  On-going      PT LONG TERM GOAL #3   Title  Patient will demonstrate 14 seconds on 5x sit ot stand test to improve her functional LE strength.     Time  6    Period  Weeks    Status  On-going      PT LONG TERM GOAL #4   Title  Patient will demonstrate 56/56 on Berg  Balance Scale to decrease risk of falls.    Time  6    Period  Weeks    Status  On-going            Plan - 02/15/18 1251    Clinical Impression Statement  Patient was able to tolerate treatment well despite reports of muscle fatigue with new exercises. Patient noticed left LE seems to be the weaker side with exercises and notices that she lists to the left when she is losing her balance. Patient expressed that she wants to be able to come to standing from the floor without UE assistance. She demonstrated that she used the tips of her fingers to assist to standing and stated it has improved significantly since the start of session.    Clinical Presentation  Stable    Clinical Decision Making  Low    Rehab Potential  Good    PT Frequency  2x / week    PT Duration  6 weeks    PT Treatment/Interventions  ADLs/Self Care Home Management;Electrical Stimulation;Cryotherapy;Iontophoresis 4mg /ml Dexamethasone;Moist Heat;Stair training;Gait training;Neuromuscular re-education;Therapeutic exercise;Therapeutic activities;Vestibular;Manual techniques;Passive range of motion    PT Next Visit Plan  eccentric strengthening cont with LE strengthening, balance activities (Focus on LE strengthening glut and hips) progress per tolerance    Consulted and Agree with Plan of Care  Patient       Patient will benefit from skilled therapeutic  intervention in order to improve the following deficits and impairments:  Pain, Decreased activity tolerance, Decreased endurance, Decreased range of motion, Decreased strength  Visit Diagnosis: Muscle weakness (generalized)  Unsteadiness on  feet     Problem List Patient Active Problem List   Diagnosis Date Noted  . Chronic systolic CHF (congestive heart failure) ([place]) 05/23/2015  . CAD S/P urgent PCI 05/16/15 05/19/2015  . Cardiomyopathy, ischemic 05/19/2015  . ST elevation (STEMI) myocardial infarction involving left anterior descending coronary artery ([place])   . Acute ST elevation myocardial infarction (STEMI) of anterolateral wall ([place]) 05/16/2015  . Encounter for therapeutic drug monitoring 07/19/2013  . 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 Aortic valve replaced 01/25/2011  . Long term current use of anticoagulant therapy 06/25/2010  . INFECTIOUS DIARRHEA 03/20/2008  . DIARRHEA-PRESUMED INFECTIOUS 03/20/2008  . URTICARIA 03/13/2008  . FATIGUE 09/12/2007  . DIARRHEA 08/29/2007  . ABDOMINAL PAIN, GENERALIZED, CHRONIC 08/29/2007  . Allergic rhinitis 05/09/2007  . HIATAL HERNIA 05/09/2007  . ARTHRITIS 05/09/2007  . OSTEOPENIA 05/09/2007  . GERD 01/23/2007  . CANCER, ENDOMETRIUM 07/25/2006  . Dyslipidemia 07/25/2006  . COMMON MIGRAINE 07/25/2006  . Essential hypertension 07/25/2006  . Hx Breast Cancer, IDC, Stage I 07/03/2003   Gabriela Eves, PT, DPT 02/15/2018, 8:31 PM  [facility] [address] [place], [place], [address] Phone: [phone]   Fax:  [phone]  Name: BROOK GERACI MRN: [id number] Date of Birth: 01-16-1946

## 2018-02-19 ENCOUNTER — Ambulatory Visit: Payer: Medicare Other | Admitting: Physical Therapy

## 2018-02-19 ENCOUNTER — Encounter: Payer: Self-pay | Admitting: Physical Therapy

## 2018-02-19 DIAGNOSIS — M6281 Muscle weakness (generalized): Secondary | ICD-10-CM

## 2018-02-19 DIAGNOSIS — R2681 Unsteadiness on feet: Secondary | ICD-10-CM

## 2018-02-19 NOTE — Therapy (Signed)
[facility] [place], [place], [address] Phone: [phone]   Fax:  [phone]  Physical Therapy Treatment  Patient Details  Name: Amanda Wiggins MRN: [id number] Date of Birth: [birth year] Referring Provider (PT): [name],   Encounter Date: 02/19/2018  PT End of Session - 02/19/18 1417    Visit Number  8    Number of Visits  12    Date for PT Re-Evaluation  03/12/18    Authorization Type  Progress note every 10th visit; KX modifier at 15th visit    PT Start Time  1345    PT Stop Time  1428    PT Time Calculation (min)  43 min    Activity Tolerance  Patient tolerated treatment well    Behavior During Therapy  [facility] for tasks assessed/performed       Past Medical History:  Diagnosis Date  . Aortic valve replaced   . Breast cancer ([place]) 2005  . FHx: migraine headaches   . GERD (gastroesophageal reflux disease)   . Hiatal hernia   . Hx Breast Cancer, IDC, Stage I 07/03/2003  . Hx of breast cancer   . Hyperlipidemia   . Hypertension   . Menopausal syndrome   . Osteopenia   . 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 Personal history of radiation therapy   . Status post aortic valve repair     Past Surgical History:  Procedure Laterality Date  . AORTIC VALVE REPLACEMENT  2003  . BREAST BIOPSY    . BREAST LUMPECTOMY Left 2005  . CARDIAC CATHETERIZATION     Ejection Fraction   . CARDIAC CATHETERIZATION N/A 05/16/2015   Procedure: Left Heart Cath and Coronary Angiography;  Surgeon: Leonie Man, MD;  Location: [place] [facility];  Service: Cardiovascular;  Laterality: N/A;  . CARDIAC CATHETERIZATION  05/16/2015   Procedure: Coronary Stent Intervention;  Surgeon: Leonie Man, MD;  Location: [place] [facility];  Service: Cardiovascular;;  . CARDIAC CATHETERIZATION N/A 05/19/2015   Procedure: Left Heart Cath and Coronary Angiography;  Surgeon: Troy Sine, MD;  Location: [place] [facility];   Service: Cardiovascular;  Laterality: N/A;  . CARDIAC CATHETERIZATION N/A 05/19/2015   Procedure: Coronary Stent Intervention;  Surgeon: Troy Sine, MD;  Location: [place] [facility];  Service: Cardiovascular;  Laterality: N/A;  . HIATAL HERNIA REPAIR    . HIATAL HERNIA REPAIR  05/11/2007  . hysterectomy, endometiral cancer  2001  . lummpectomy breast cancer  07/03/2003  . nissan fundoplicaiton  0315   post-op hematoma on heparin   . right shouler surgery      There were no vitals filed for this visit.  Subjective Assessment - 02/19/18 1351    Subjective  Patient reported no new complaints and doing well today    Pertinent History  HTN, history of cancer, aortic valve replacement, 2 heart attacks 2017    Limitations  Standing;House hold activities    Patient Stated Goals  improve [facility]    Currently in Pain?  No/denies                       [facility] Adult PT Treatment/Exercise - 02/19/18 0001      Lumbar Exercises: Aerobic   Nustep  15 min L4 UE/LE, monitored      Knee/Hip Exercises: Machines for Strengthening   Cybex Knee Extension  10# 2x10  Cybex Knee Flexion  20# 2x10      Knee/Hip Exercises: Standing   Functional Squat  2 sets;10 reps    Functional Squat Limitations  chair taps          Balance Exercises - 02/19/18 1408      Balance Exercises: Standing   Rockerboard  Anterior/posterior   1mn   Step Ups  Forward;6 inch   2x10   Other Standing Exercises  resisted walking with pink XTS [place]/backward/side to side bil             PT Long Term Goals - 02/19/18 1418      PT LONG TERM GOAL #1   Title  Patient will be independent with HEP    Time  6    Period  Weeks    Status  Achieved   Met 02/19/18     PT LONG TERM GOAL #2   Title  Patient will demonstrate 4/5 bilateral knee and hip MMT to improve stability during functional tasks.    Time  6    Period  Weeks    Status  On-going      PT LONG  TERM GOAL #3   Title  Patient will demonstrate 14 seconds on 5x sit ot stand test to improve her functional LE strength.     Baseline  Met 02/19/18    Time  6    Period  Weeks    Status  Achieved      PT LONG TERM GOAL #4   Title  Patient will demonstrate 56/56 on Berg  Balance Scale to decrease risk of falls.    Time  6    Period  Weeks    Status  On-going            Plan - 02/19/18 1429    Clinical Impression Statement  Patient tolerated treatment well today. Patient able to progress with strengthening exericses and balance activities. Patient reported doing very well with HEP and is consistance with them. Patient able to perform sit to stand in a timely mannor. Patient met goal #1 and #3 with others ongoing due to pain deficts.     Rehab Potential  Good    PT Frequency  2x / week    PT Duration  6 weeks    PT Treatment/Interventions  ADLs/Self Care Home Management;Electrical Stimulation;Cryotherapy;Iontophoresis 476mml Dexamethasone;Moist Heat;Stair training;Gait training;Neuromuscular re-education;Therapeutic exercise;Therapeutic activities;Vestibular;Manual techniques;Passive range of motion    PT Next Visit Plan  eccentric strengthening cont with LE strengthening, balance activities (Focus on LE strengthening glut and hips) progress per tolerance    Consulted and Agree with Plan of Care  Patient       Patient will benefit from skilled therapeutic intervention in order to improve the following deficits and impairments:  Pain, Decreased activity tolerance, Decreased endurance, Decreased range of motion, Decreased strength  Visit Diagnosis: Muscle weakness (generalized)  Unsteadiness on feet     Problem List Patient Active Problem List   Diagnosis Date Noted  . Chronic systolic CHF (congestive heart failure) ([place]03/18/2017  . CAD S/P urgent PCI 05/16/15 05/19/2015  . Cardiomyopathy, ischemic 05/19/2015  . ST elevation (STEMI) myocardial infarction involving left  anterior descending coronary artery ([facility]  . Acute ST elevation myocardial infarction (STEMI) of anterolateral wall ([facility]CO[place]03/01/2016  . Encounter for therapeutic drug monitoring 07/19/2013  . 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 Aortic valve replaced 01/25/2011  . Long term current use of anticoagulant therapy 06/25/2010  .  INFECTIOUS DIARRHEA 03/20/2008  . DIARRHEA-PRESUMED INFECTIOUS 03/20/2008  . URTICARIA 03/13/2008  . FATIGUE 09/12/2007  . DIARRHEA 08/29/2007  . ABDOMINAL PAIN, GENERALIZED, CHRONIC 08/29/2007  . Allergic rhinitis 05/09/2007  . HIATAL HERNIA 05/09/2007  . ARTHRITIS 05/09/2007  . OSTEOPENIA 05/09/2007  . GERD 01/23/2007  . CANCER, ENDOMETRIUM 07/25/2006  . Dyslipidemia 07/25/2006  . COMMON MIGRAINE 07/25/2006  . Essential hypertension 07/25/2006  . Hx Breast Cancer, IDC, Stage I 07/03/2003    Avyukth Bontempo P, PTA 02/19/2018, 2:33 PM  [facility] [place], [place], [address] Phone: [phone]   Fax:  [phone]  Name: Amanda Wiggins MRN: [id number] Date of Birth: 1946/02/15

## 2018-02-22 ENCOUNTER — Ambulatory Visit: Payer: Medicare Other | Admitting: Physical Therapy

## 2018-02-22 ENCOUNTER — Encounter: Payer: Self-pay | Admitting: Physical Therapy

## 2018-02-22 DIAGNOSIS — R2681 Unsteadiness on feet: Secondary | ICD-10-CM

## 2018-02-22 DIAGNOSIS — M6281 Muscle weakness (generalized): Secondary | ICD-10-CM

## 2018-02-22 NOTE — Therapy (Signed)
[facility] [place], [place], [address] Phone: [phone]   Fax:  [phone]  Physical Therapy Treatment  Patient Details  Name: Amanda Wiggins MRN: [id number] Date of Birth: 10/29/1945 Referring Provider (PT): [name],   Encounter Date: 02/22/2018  PT End of Session - 02/22/18 1524    Visit Number  9    Number of Visits  12    Date for PT Re-Evaluation  03/12/18    Authorization Type  Progress note every 10th visit; KX modifier at 15th visit    PT Start Time  1515    PT Stop Time  1600    PT Time Calculation (min)  45 min    Activity Tolerance  Patient tolerated treatment well    Behavior During Therapy  [facility] for tasks assessed/performed       Past Medical History:  Diagnosis Date  . Aortic valve replaced   . Breast cancer ([place]) 2005  . FHx: migraine headaches   . GERD (gastroesophageal reflux disease)   . Hiatal hernia   . Hx Breast Cancer, IDC, Stage I 07/03/2003  . Hx of breast cancer   . Hyperlipidemia   . Hypertension   . Menopausal syndrome   . Osteopenia   . 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 Personal history of radiation therapy   . Status post aortic valve repair     Past Surgical History:  Procedure Laterality Date  . AORTIC VALVE REPLACEMENT  2003  . BREAST BIOPSY    . BREAST LUMPECTOMY Left 2005  . CARDIAC CATHETERIZATION     Ejection Fraction   . CARDIAC CATHETERIZATION N/A 05/16/2015   Procedure: Left Heart Cath and Coronary Angiography;  Surgeon: Leonie Man, MD;  Location: [place] [facility];  Service: Cardiovascular;  Laterality: N/A;  . CARDIAC CATHETERIZATION  05/16/2015   Procedure: Coronary Stent Intervention;  Surgeon: Leonie Man, MD;  Location: [place] [facility];  Service: Cardiovascular;;  . CARDIAC CATHETERIZATION N/A 05/19/2015   Procedure: Left Heart Cath and Coronary Angiography;  Surgeon: Troy Sine, MD;  Location: [place] [facility];   Service: Cardiovascular;  Laterality: N/A;  . CARDIAC CATHETERIZATION N/A 05/19/2015   Procedure: Coronary Stent Intervention;  Surgeon: Troy Sine, MD;  Location: [place] [facility];  Service: Cardiovascular;  Laterality: N/A;  . HIATAL HERNIA REPAIR    . HIATAL HERNIA REPAIR  05/11/2007  . hysterectomy, endometiral cancer  2001  . lummpectomy breast cancer  07/03/2003  . nissan fundoplicaiton  0017   post-op hematoma on heparin   . right shouler surgery      There were no vitals filed for this visit.  Subjective Assessment - 02/22/18 1525    Subjective  Patient reports doing well.    Pertinent History  HTN, history of cancer, aortic valve replacement, 2 heart attacks 2017    Limitations  Standing;House hold activities    Patient Stated Goals  improve [facility]    Currently in Pain?  No/denies         [facility] PT Assessment - 02/22/18 0001      Assessment   Medical Diagnosis  Balance problem                   [facility] Adult PT Treatment/Exercise - 02/22/18 0001      Lumbar Exercises: Aerobic   Nustep  15 min L4 UE/LE, monitored  Knee/Hip Exercises: Machines for Strengthening   Cybex Knee Extension  10# 2x10    Cybex Knee Flexion  20# 2x10    Cybex Leg Press  1 plate 2x10      Knee/Hip Exercises: Standing   Forward Lunges  Both;10 reps    Forward Lunges Limitations  kneeling on airex    Step Down  Both;2 sets;10 reps;Hand Hold: 2;Step Height: 4"    Functional Squat  2 sets;10 reps    Functional Squat Limitations  chair taps           Balance Exercises - 02/22/18 1801      Balance Exercises: Standing   Standing Eyes Closed  --   1 minute on rockerboard   Rockerboard  Anterior/posterior   3 minutes   Step Ups  Forward;6 inch             PT Long Term Goals - 02/19/18 1418      PT LONG TERM GOAL #1   Title  Patient will be independent with HEP    Time  6    Period  Weeks    Status  Achieved   Met  02/19/18     PT LONG TERM GOAL #2   Title  Patient will demonstrate 4/5 bilateral knee and hip MMT to improve stability during functional tasks.    Time  6    Period  Weeks    Status  On-going      PT LONG TERM GOAL #3   Title  Patient will demonstrate 14 seconds on 5x sit ot stand test to improve her functional LE strength.     Baseline  Met 02/19/18    Time  6    Period  Weeks    Status  Achieved      PT LONG TERM GOAL #4   Title  Patient will demonstrate 56/56 on Berg  Balance Scale to decrease risk of falls.    Time  6    Period  Weeks    Status  On-going            Plan - 02/22/18 1542    Clinical Impression Statement  Patient was able to tolerate progression of treatment well. Patient continues to have the biggest difficulties with eccentric control during functional squats. Patient has noted with significant improvements in strength and balance during daily activities and is going to try to return to the [facility] to maintain gains after PT.    Clinical Presentation  Stable    Clinical Decision Making  Low    Rehab Potential  Good    PT Frequency  2x / week    PT Duration  6 weeks    PT Treatment/Interventions  ADLs/Self Care Home Management;Electrical Stimulation;Cryotherapy;Iontophoresis 38m/ml Dexamethasone;Moist Heat;Stair training;Gait training;Neuromuscular re-education;Therapeutic exercise;Therapeutic activities;Vestibular;Manual techniques;Passive range of motion    PT Next Visit Plan  eccentric strengthening cont with LE strengthening, balance activities (Focus on LE strengthening glut and hips) progress per tolerance    Consulted and Agree with Plan of Care  Patient       Patient will benefit from skilled therapeutic intervention in order to improve the following deficits and impairments:  Pain, Decreased activity tolerance, Decreased endurance, Decreased range of motion, Decreased strength  Visit Diagnosis: Muscle weakness (generalized)  Unsteadiness on  feet     Problem List Patient Active Problem List   Diagnosis Date Noted  . Chronic systolic CHF (congestive heart failure) ([place] 05/23/2015  . CAD S/P urgent PCI  05/16/15 05/19/2015  . Cardiomyopathy, ischemic 05/19/2015  . ST elevation (STEMI) myocardial infarction involving left anterior descending coronary artery ([place])   . Acute ST elevation myocardial infarction (STEMI) of anterolateral wall ([place]) 05/16/2015  . Encounter for therapeutic drug monitoring 07/19/2013  . 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 Aortic valve replaced 01/25/2011  . Long term current use of anticoagulant therapy 06/25/2010  . INFECTIOUS DIARRHEA 03/20/2008  . DIARRHEA-PRESUMED INFECTIOUS 03/20/2008  . URTICARIA 03/13/2008  . FATIGUE 09/12/2007  . DIARRHEA 08/29/2007  . ABDOMINAL PAIN, GENERALIZED, CHRONIC 08/29/2007  . Allergic rhinitis 05/09/2007  . HIATAL HERNIA 05/09/2007  . ARTHRITIS 05/09/2007  . OSTEOPENIA 05/09/2007  . GERD 01/23/2007  . CANCER, ENDOMETRIUM 07/25/2006  . Dyslipidemia 07/25/2006  . COMMON MIGRAINE 07/25/2006  . Essential hypertension 07/25/2006  . Hx Breast Cancer, IDC, Stage I 07/03/2003   Gabriela Eves, PT, DPT 02/22/2018, 6:05 PM  [facility] [address] [place], [place], [address] Phone: [phone]   Fax:  [phone]  Name: Amanda Wiggins MRN: [id number] Date of Birth: April 09, 1945

## 2018-02-26 ENCOUNTER — Ambulatory Visit: Payer: Medicare Other

## 2018-02-26 ENCOUNTER — Ambulatory Visit: Payer: Medicare Other | Admitting: Physical Therapy

## 2018-02-26 ENCOUNTER — Encounter: Payer: Self-pay | Admitting: Physical Therapy

## 2018-02-26 DIAGNOSIS — M6281 Muscle weakness (generalized): Secondary | ICD-10-CM

## 2018-02-26 DIAGNOSIS — Z952 Presence of prosthetic heart valve: Secondary | ICD-10-CM

## 2018-02-26 DIAGNOSIS — R2681 Unsteadiness on feet: Secondary | ICD-10-CM

## 2018-02-26 DIAGNOSIS — Z5181 Encounter for therapeutic drug level monitoring: Secondary | ICD-10-CM

## 2018-02-26 LAB — POCT INR: INR: 2.8 (ref 2.0–3.0)

## 2018-02-26 MED ORDER — WARFARIN SODIUM 5 MG PO TABS: ORAL_TABLET | ORAL | 1 refills | Status: DC

## 2018-02-26 NOTE — Patient Instructions (Signed)
Description   Continue taking 1/2 tablet daily except 1 tablet on Tuesdays, Thursdays, and Saturdays. Recheck in 4 weeks. [facility] [phone]

## 2018-02-26 NOTE — Therapy (Signed)
[facility] [place], [place], [address] Phone: [phone]   Fax:  [phone]  Physical Therapy Treatment  Patient Details  Name: Amanda Wiggins MRN: [id number] Date of Birth: Mar 05, 1946 Referring Provider (PT): [name],   Encounter Date: 02/26/2018  PT End of Session - 02/26/18 1404    Visit Number  10    Number of Visits  12    Date for PT Re-Evaluation  03/12/18    Authorization Type  Progress note every 10th visit; KX modifier at 15th visit    PT Start Time  1345    PT Stop Time  1425    PT Time Calculation (min)  40 min    Activity Tolerance  Patient tolerated treatment well    Behavior During Therapy  [facility] for tasks assessed/performed       Past Medical History:  Diagnosis Date  . Aortic valve replaced   . Breast cancer ([place]) 2005  . FHx: migraine headaches   . GERD (gastroesophageal reflux disease)   . Hiatal hernia   . Hx Breast Cancer, IDC, Stage I 07/03/2003  . Hx of breast cancer   . Hyperlipidemia   . Hypertension   . Menopausal syndrome   . Osteopenia   . 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 Personal history of radiation therapy   . Status post aortic valve repair     Past Surgical History:  Procedure Laterality Date  . AORTIC VALVE REPLACEMENT  2003  . BREAST BIOPSY    . BREAST LUMPECTOMY Left 2005  . CARDIAC CATHETERIZATION     Ejection Fraction   . CARDIAC CATHETERIZATION N/A 05/16/2015   Procedure: Left Heart Cath and Coronary Angiography;  Surgeon: Leonie Man, MD;  Location: [place] [facility];  Service: Cardiovascular;  Laterality: N/A;  . CARDIAC CATHETERIZATION  05/16/2015   Procedure: Coronary Stent Intervention;  Surgeon: Leonie Man, MD;  Location: [place] [facility];  Service: Cardiovascular;;  . CARDIAC CATHETERIZATION N/A 05/19/2015   Procedure: Left Heart Cath and Coronary Angiography;  Surgeon: Troy Sine, MD;  Location: [place] [facility];   Service: Cardiovascular;  Laterality: N/A;  . CARDIAC CATHETERIZATION N/A 05/19/2015   Procedure: Coronary Stent Intervention;  Surgeon: Troy Sine, MD;  Location: [place] [facility];  Service: Cardiovascular;  Laterality: N/A;  . HIATAL HERNIA REPAIR    . HIATAL HERNIA REPAIR  05/11/2007  . hysterectomy, endometiral cancer  2001  . lummpectomy breast cancer  07/03/2003  . nissan fundoplicaiton  6468   post-op hematoma on heparin   . right shouler surgery      There were no vitals filed for this visit.  Subjective Assessment - 02/26/18 1346    Subjective  Patient reported doing well after last treatment    Pertinent History  HTN, history of cancer, aortic valve replacement, 2 heart attacks 2017    Limitations  Standing;House hold activities    Patient Stated Goals  improve [facility]    Currently in Pain?  No/denies                       [facility] Adult PT Treatment/Exercise - 02/26/18 0001      Lumbar Exercises: Aerobic   Nustep  15 min L4 UE/LE, monitored      Knee/Hip Exercises: Machines for Strengthening   Cybex Knee Extension  10# 2x10    Cybex Knee  Flexion  20# 2x10    Cybex Leg Press  1 plate 2x10   with ball squeeze     Knee/Hip Exercises: Standing   Forward Step Up  Both;2 sets;10 reps;Step Height: 4"    Step Down  Both;2 sets;10 reps;Hand Hold: 2;Step Height: 4"    Functional Squat  2 sets;10 reps    Functional Squat Limitations  chair taps    Other Standing Knee Exercises  wall slide 2x10                  PT Long Term Goals - 02/19/18 1418      PT LONG TERM GOAL #1   Title  Patient will be independent with HEP    Time  6    Period  Weeks    Status  Achieved   Met 02/19/18     PT LONG TERM GOAL #2   Title  Patient will demonstrate 4/5 bilateral knee and hip MMT to improve stability during functional tasks.    Time  6    Period  Weeks    Status  On-going      PT LONG TERM GOAL #3   Title   Patient will demonstrate 14 seconds on 5x sit ot stand test to improve her functional LE strength.     Baseline  Met 02/19/18    Time  6    Period  Weeks    Status  Achieved      PT LONG TERM GOAL #4   Title  Patient will demonstrate 56/56 on Berg  Balance Scale to decrease risk of falls.    Time  6    Period  Weeks    Status  On-going            Plan - 02/26/18 1418    Clinical Impression Statement  Patient tolerated treatment well today. Patient able to progress exercises with good progress. Patient reported some soreness with lunge exercises yet a good progress. Patient goals ongoing at this time.     Rehab Potential  Good    PT Frequency  2x / week    PT Duration  6 weeks    PT Treatment/Interventions  ADLs/Self Care Home Management;Electrical Stimulation;Cryotherapy;Iontophoresis 70m/ml Dexamethasone;Moist Heat;Stair training;Gait training;Neuromuscular re-education;Therapeutic exercise;Therapeutic activities;Vestibular;Manual techniques;Passive range of motion    PT Next Visit Plan  eccentric strengthening cont with LE strengthening, balance activities (Focus on LE strengthening glut and hips) progress per tolerance    Consulted and Agree with Plan of Care  Patient       Patient will benefit from skilled therapeutic intervention in order to improve the following deficits and impairments:  Pain, Decreased activity tolerance, Decreased endurance, Decreased range of motion, Decreased strength  Visit Diagnosis: Muscle weakness (generalized)  Unsteadiness on feet     Problem List Patient Active Problem List   Diagnosis Date Noted  . Chronic systolic CHF (congestive heart failure) ([place] 05/23/2015  . CAD S/P urgent PCI 05/16/15 05/19/2015  . Cardiomyopathy, ischemic 05/19/2015  . ST elevation (STEMI) myocardial infarction involving left anterior descending coronary artery ([place]   . Acute ST elevation myocardial infarction (STEMI) of anterolateral wall ([place] 05/16/2015   . Encounter for therapeutic drug monitoring 07/19/2013  . 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 Aortic valve replaced 01/25/2011  . Long term current use of anticoagulant therapy 06/25/2010  . INFECTIOUS DIARRHEA 03/20/2008  . DIARRHEA-PRESUMED INFECTIOUS 03/20/2008  . URTICARIA 03/13/2008  . FATIGUE 09/12/2007  . DIARRHEA 08/29/2007  .  ABDOMINAL PAIN, GENERALIZED, CHRONIC 08/29/2007  . Allergic rhinitis 05/09/2007  . HIATAL HERNIA 05/09/2007  . ARTHRITIS 05/09/2007  . OSTEOPENIA 05/09/2007  . GERD 01/23/2007  . CANCER, ENDOMETRIUM 07/25/2006  . Dyslipidemia 07/25/2006  . COMMON MIGRAINE 07/25/2006  . Essential hypertension 07/25/2006  . Hx Breast Cancer, IDC, Stage I 07/03/2003    ,  P, PTA 02/26/2018, 2:25 PM  [facility] [place], [place], [address] Phone: [phone]   Fax:  [phone]  Name: Amanda Wiggins MRN: [id number] Date of Birth: 02/15/1946

## 2018-03-01 ENCOUNTER — Encounter: Payer: Self-pay | Admitting: Physical Therapy

## 2018-03-01 ENCOUNTER — Ambulatory Visit: Payer: Medicare Other | Admitting: Physical Therapy

## 2018-03-01 DIAGNOSIS — M6281 Muscle weakness (generalized): Secondary | ICD-10-CM

## 2018-03-01 DIAGNOSIS — R2681 Unsteadiness on feet: Secondary | ICD-10-CM

## 2018-03-01 NOTE — Therapy (Signed)
[facility] [place], [place], [address] Phone: [phone]   Fax:  [phone]  Physical Therapy Treatment  Patient Details  Name: Amanda Wiggins MRN: [id number] Date of Birth: 10/26/1945 Referring Provider (PT): [name],   Encounter Date: 03/01/2018  PT End of Session - 03/01/18 1356    Visit Number  11    Number of Visits  12    Date for PT Re-Evaluation  03/12/18    Authorization Type  Progress note every 10th visit; KX modifier at 15th visit    PT Start Time  1343    PT Stop Time  1424    PT Time Calculation (min)  41 min    Activity Tolerance  Patient tolerated treatment well    Behavior During Therapy  [facility] for tasks assessed/performed       Past Medical History:  Diagnosis Date  . Aortic valve replaced   . Breast cancer ([place]) 2005  . FHx: migraine headaches   . GERD (gastroesophageal reflux disease)   . Hiatal hernia   . Hx Breast Cancer, IDC, Stage I 07/03/2003  . Hx of breast cancer   . Hyperlipidemia   . Hypertension   . Menopausal syndrome   . Osteopenia   . 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 Personal history of radiation therapy   . Status post aortic valve repair     Past Surgical History:  Procedure Laterality Date  . AORTIC VALVE REPLACEMENT  2003  . BREAST BIOPSY    . BREAST LUMPECTOMY Left 2005  . CARDIAC CATHETERIZATION     Ejection Fraction   . CARDIAC CATHETERIZATION N/A 05/16/2015   Procedure: Left Heart Cath and Coronary Angiography;  Surgeon: Leonie Man, MD;  Location: [place] [facility];  Service: Cardiovascular;  Laterality: N/A;  . CARDIAC CATHETERIZATION  05/16/2015   Procedure: Coronary Stent Intervention;  Surgeon: Leonie Man, MD;  Location: [place] [facility];  Service: Cardiovascular;;  . CARDIAC CATHETERIZATION N/A 05/19/2015   Procedure: Left Heart Cath and Coronary Angiography;  Surgeon: Troy Sine, MD;  Location: [place] [facility];   Service: Cardiovascular;  Laterality: N/A;  . CARDIAC CATHETERIZATION N/A 05/19/2015   Procedure: Coronary Stent Intervention;  Surgeon: Troy Sine, MD;  Location: [place] [facility];  Service: Cardiovascular;  Laterality: N/A;  . HIATAL HERNIA REPAIR    . HIATAL HERNIA REPAIR  05/11/2007  . hysterectomy, endometiral cancer  2001  . lummpectomy breast cancer  07/03/2003  . nissan fundoplicaiton  [id number]   post-op hematoma on heparin   . right shouler surgery      There were no vitals filed for this visit.  Subjective Assessment - 03/01/18 1347    Subjective  Patient doing well upon arrival and no new complaints    Pertinent History  HTN, history of cancer, aortic valve replacement, 2 heart attacks 2017    Limitations  Standing;House hold activities    Patient Stated Goals  improve [facility]    Currently in Pain?  No/denies         [facility] PT Assessment - 03/01/18 0001      Strength   Strength Assessment Site  Knee;Hip    Right/Left Hip  Right;Left    Right Hip Flexion  4/5    Right Hip ABduction  4/5    Left Hip Flexion  4-/5    Left Hip ABduction  4/5  Right/Left Knee  Right;Left    Right Knee Flexion  4-/5    Right Knee Extension  4/5    Left Knee Flexion  4/5    Left Knee Extension  4-/5      Berg Balance Test   Sit to Stand  Able to stand without using hands and stabilize independently    Standing Unsupported  Able to stand safely 2 minutes    Sitting with Back Unsupported but Feet Supported on Floor or Stool  Able to sit safely and securely 2 minutes    Stand to Sit  Sits safely with minimal use of hands    Transfers  Able to transfer safely, minor use of hands    Standing Unsupported with Eyes Closed  Able to stand 10 seconds safely    Standing Ubsupported with Feet Together  Able to place feet together independently and stand 1 minute safely    From Standing, Reach Forward with Outstretched Arm  Can reach confidently >25 cm (10")     From Standing Position, Pick up Object from Floor  Able to pick up shoe safely and easily    From Standing Position, Turn to Look Behind Over each Shoulder  Looks behind from both sides and weight shifts well    Turn 360 Degrees  Able to turn 360 degrees safely in 4 seconds or less    Standing Unsupported, Alternately Place Feet on Step/Stool  Able to stand independently and safely and complete 8 steps in 20 seconds    Standing Unsupported, One Foot in Front  Able to place foot tandem independently and hold 30 seconds    Standing on One Leg  Able to lift leg independently and hold > 10 seconds    Total Score  56                   [facility] Adult PT Treatment/Exercise - 03/01/18 0001      Lumbar Exercises: Aerobic   Nustep  15 min L4 UE/LE, monitored      Knee/Hip Exercises: Machines for Strengthening   Cybex Knee Extension  10# 2x10    Cybex Knee Flexion  20# 2x10    Cybex Leg Press  1 plate 2x10   with ball squeeze     Knee/Hip Exercises: Standing   Forward Step Up  Both;2 sets;10 reps;Step Height: 6"    Step Down  Both;2 sets;10 reps;Hand Hold: 2;Step Height: 4"    Other Standing Knee Exercises  wall slide 2x10                  PT Long Term Goals - 03/01/18 1403      PT LONG TERM GOAL #1   Title  Patient will be independent with HEP    Time  6    Period  Weeks    Status  Achieved      PT LONG TERM GOAL #2   Title  Patient will demonstrate 4/5 bilateral knee and hip MMT to improve stability during functional tasks.    Time  6    Period  Weeks    Status  On-going   -4 to 4/5 03/01/18     PT LONG TERM GOAL #3   Title  Patient will demonstrate 14 seconds on 5x sit ot stand test to improve her functional LE strength.     Baseline  Met 02/19/18    Time  6    Period  Weeks    Status  Achieved      PT LONG TERM GOAL #4   Title  Patient will demonstrate 56/56 on Berg  Balance Scale to decrease risk of falls.    Time  6    Period  Weeks    Status   Achieved   56/56 03/01/18           Plan - 03/01/18 1412    Clinical Impression Statement  Patient tolerated treatment well today. Patient improved balance and has BERG 56/56 today. Patient feels independed with all ADL's. Patient has improved bil LE strength today to -4 to 4/5. Met all goals except LE strength.    Rehab Potential  Good    PT Frequency  2x / week    PT Duration  6 weeks    PT Treatment/Interventions  ADLs/Self Care Home Management;Electrical Stimulation;Cryotherapy;Iontophoresis 57m/ml Dexamethasone;Moist Heat;Stair training;Gait training;Neuromuscular re-education;Therapeutic exercise;Therapeutic activities;Vestibular;Manual techniques;Passive range of motion    PT Next Visit Plan  1 visit and DC    Consulted and Agree with Plan of Care  Patient       Patient will benefit from skilled therapeutic intervention in order to improve the following deficits and impairments:  Pain, Decreased activity tolerance, Decreased endurance, Decreased range of motion, Decreased strength  Visit Diagnosis: Muscle weakness (generalized)  Unsteadiness on feet     Problem List Patient Active Problem List   Diagnosis Date Noted  . Chronic systolic CHF (congestive heart failure) ([place] 05/23/2015  . CAD S/P urgent PCI 05/16/15 05/19/2015  . Cardiomyopathy, ischemic 05/19/2015  . ST elevation (STEMI) myocardial infarction involving [place] anterior descending coronary artery ([place]   . Acute ST elevation myocardial infarction (STEMI) of anterolateral wall ([place] 05/16/2015  . Encounter for therapeutic drug monitoring 07/19/2013  . 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 Aortic valve replaced 01/25/2011  . Long term current use of anticoagulant therapy 06/25/2010  . INFECTIOUS DIARRHEA 03/20/2008  . DIARRHEA-PRESUMED INFECTIOUS 03/20/2008  . URTICARIA 03/13/2008  . FATIGUE 09/12/2007  . DIARRHEA 08/29/2007  . ABDOMINAL PAIN, GENERALIZED, CHRONIC 08/29/2007  . Allergic  rhinitis 05/09/2007  . HIATAL HERNIA 05/09/2007  . ARTHRITIS 05/09/2007  . OSTEOPENIA 05/09/2007  . GERD 01/23/2007  . CANCER, ENDOMETRIUM 07/25/2006  . Dyslipidemia 07/25/2006  . COMMON MIGRAINE 07/25/2006  . Essential hypertension 07/25/2006  . Hx Breast Cancer, IDC, Stage I 07/03/2003    Webb Weed P, PTA 03/01/2018, 2:26 PM  [facility] [place] [address]Phone: [phone]  Fax:  [phone] Name: JSIVAN CUELLOMRN: [id number] of Birth: 1August 24, 1947

## 2018-03-05 ENCOUNTER — Encounter: Payer: Self-pay | Admitting: Physical Therapy

## 2018-03-05 ENCOUNTER — Ambulatory Visit: Payer: Medicare Other | Admitting: Physical Therapy

## 2018-03-05 DIAGNOSIS — M6281 Muscle weakness (generalized): Secondary | ICD-10-CM

## 2018-03-05 DIAGNOSIS — R2681 Unsteadiness on feet: Secondary | ICD-10-CM

## 2018-03-05 NOTE — Therapy (Signed)
[facility] [place], [place], [address] Phone: [phone]   Fax:  [phone]  Physical Therapy Treatment/Discharge  Patient Details  Name: Amanda Wiggins MRN: [id number] Date of Birth: [birth year] Referring Provider (PT): [name],   Encounter Date: 03/05/2018  PT End of Session - 03/05/18 0817    Visit Number  12    Number of Visits  12    Date for PT Re-Evaluation  03/12/18    Authorization Type  Progress note every 10th visit; KX modifier at 15th visit    PT Start Time  0813    PT Stop Time  0853    PT Time Calculation (min)  40 min    Activity Tolerance  Patient tolerated treatment well    Behavior During Therapy  [facility] for tasks assessed/performed       Past Medical History:  Diagnosis Date  . Aortic valve replaced   . Breast cancer ([place]) 2005  . FHx: migraine headaches   . GERD (gastroesophageal reflux disease)   . Hiatal hernia   . Hx Breast Cancer, IDC, Stage I 07/03/2003  . Hx of breast cancer   . Hyperlipidemia   . Hypertension   . Menopausal syndrome   . Osteopenia   . 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 Personal history of radiation therapy   . Status post aortic valve repair     Past Surgical History:  Procedure Laterality Date  . AORTIC VALVE REPLACEMENT  2003  . BREAST BIOPSY    . BREAST LUMPECTOMY Left 2005  . CARDIAC CATHETERIZATION     Ejection Fraction   . CARDIAC CATHETERIZATION N/A 05/16/2015   Procedure: Left Heart Cath and Coronary Angiography;  Surgeon: Leonie Man, MD;  Location: [place] [facility];  Service: Cardiovascular;  Laterality: N/A;  . CARDIAC CATHETERIZATION  05/16/2015   Procedure: Coronary Stent Intervention;  Surgeon: Leonie Man, MD;  Location: [place] [facility];  Service: Cardiovascular;;  . CARDIAC CATHETERIZATION N/A 05/19/2015   Procedure: Left Heart Cath and Coronary Angiography;  Surgeon: Troy Sine, MD;  Location: [place]  [facility];  Service: Cardiovascular;  Laterality: N/A;  . CARDIAC CATHETERIZATION N/A 05/19/2015   Procedure: Coronary Stent Intervention;  Surgeon: Troy Sine, MD;  Location: [place] [facility];  Service: Cardiovascular;  Laterality: N/A;  . HIATAL HERNIA REPAIR    . HIATAL HERNIA REPAIR  05/11/2007  . hysterectomy, endometiral cancer  2001  . lummpectomy breast cancer  07/03/2003  . nissan fundoplicaiton  2353   post-op hematoma on heparin   . right shouler surgery      There were no vitals filed for this visit.  Subjective Assessment - 03/05/18 0814    Subjective  Patient doing well upon arrival and did well after last treatment    Pertinent History  HTN, history of cancer, aortic valve replacement, 2 heart attacks 2017    Limitations  Standing;House hold activities    Patient Stated Goals  improve [facility]    Currently in Pain?  No/denies         [facility] PT Assessment - 03/05/18 0001      Strength   Strength Assessment Site  Knee;Hip    Right/Left Hip  Right;Left    Right Hip Flexion  4/5    Right Hip ABduction  4/5    Left Hip Flexion  4-/5    Left Hip ABduction  4/5    Right/Left Knee  Left;Right    Right Knee Flexion  4-/5    Right Knee Extension  4/5    Left Knee Flexion  4/5    Left Knee Extension  4-/5                   [facility] Adult PT Treatment/Exercise - 03/05/18 0001      Lumbar Exercises: Aerobic   Nustep  15 min L4 UE/LE, monitored      Knee/Hip Exercises: Machines for Strengthening   Cybex Knee Extension  10# 2x10    Cybex Knee Flexion  20# 2x10    Cybex Leg Press  1 plate 2x10   ball squeeze     Knee/Hip Exercises: Standing   Hip Extension  Stengthening;Both;2 sets;10 reps    Lateral Step Up  Both;2 sets;10 reps;Step Height: 6"    Forward Step Up  Both;2 sets;10 reps;Step Height: 6"    Step Down  Both;2 sets;10 reps;Hand Hold: 2;Step Height: 4"    Functional Squat  2 sets;10 reps    Functional Squat  Limitations  chair taps    Other Standing Knee Exercises  wall slide 2x10                  PT Long Term Goals - 03/05/18 0818      PT LONG TERM GOAL #1   Title  Patient will be independent with HEP    Time  6    Period  Weeks    Status  Achieved      PT LONG TERM GOAL #2   Title  Patient will demonstrate 4/5 bilateral knee and hip MMT to improve stability during functional tasks.    Time  6    Period  Weeks    Status  Partially Met      PT LONG TERM GOAL #3   Title  Patient will demonstrate 14 seconds on 5x sit ot stand test to improve her functional LE strength.     Baseline  Met 02/19/18    Time  6    Period  Weeks    Status  Achieved      PT LONG TERM GOAL #4   Title  Patient will demonstrate 56/56 on Berg  Balance Scale to decrease risk of falls.    Time  6    Period  Weeks    Status  Achieved            Plan - 03/05/18 0855    Clinical Impression Statement  Patient tolerated treatment well today and overall improvement. Patient has improved balance and LE strengthening. Patient met all goals except partial on LE strength. Patient is independent with HEP and all ADL's and is to join [facility] program to maintain and continue her execises and activity tolerance. DC today.    Rehab Potential  Good    PT Frequency  2x / week    PT Duration  6 weeks    PT Treatment/Interventions  ADLs/Self Care Home Management;Electrical Stimulation;Cryotherapy;Iontophoresis 60m/ml Dexamethasone;Moist Heat;Stair training;Gait training;Neuromuscular re-education;Therapeutic exercise;Therapeutic activities;Vestibular;Manual techniques;Passive range of motion    PT Next Visit Plan  DC    Consulted and Agree with Plan of Care  Patient       Patient will benefit from skilled therapeutic intervention in order to improve the following deficits and impairments:  Pain, Decreased activity tolerance, Decreased endurance, Decreased range of motion, Decreased strength  Visit  Diagnosis: Muscle weakness (generalized)  Unsteadiness on feet     Problem List Patient Active Problem List   Diagnosis Date Noted  . Chronic systolic CHF (congestive heart failure) ([place]) 05/23/2015  . CAD S/P urgent PCI 05/16/15 05/19/2015  . Cardiomyopathy, ischemic 05/19/2015  . ST elevation (STEMI) myocardial infarction involving left anterior descending coronary artery ([place])   . Acute ST elevation myocardial infarction (STEMI) of anterolateral wall ([place]) 05/16/2015  . Encounter for therapeutic drug monitoring 07/19/2013  . 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 Aortic valve replaced 01/25/2011  . Long term current use of anticoagulant therapy 06/25/2010  . INFECTIOUS DIARRHEA 03/20/2008  . DIARRHEA-PRESUMED INFECTIOUS 03/20/2008  . URTICARIA 03/13/2008  . FATIGUE 09/12/2007  . DIARRHEA 08/29/2007  . ABDOMINAL PAIN, GENERALIZED, CHRONIC 08/29/2007  . Allergic rhinitis 05/09/2007  . HIATAL HERNIA 05/09/2007  . ARTHRITIS 05/09/2007  . OSTEOPENIA 05/09/2007  . GERD 01/23/2007  . CANCER, ENDOMETRIUM 07/25/2006  . Dyslipidemia 07/25/2006  . COMMON MIGRAINE 07/25/2006  . Essential hypertension 07/25/2006  . Hx Breast Cancer, IDC, Stage I 07/03/2003    Ladean Raya, PTA 03/05/18 8:57 AM  [facility] [place], [place], [address] Phone: [phone]   Fax:  [phone]  Name: Amanda Wiggins MRN: [id number] Date of Birth: Jan 16, 1946  PHYSICAL THERAPY DISCHARGE SUMMARY  Visits from Start of Care:12  Current functional level related to goals / functional outcomes: See above   Remaining deficits: Strength goal not met   Education / Equipment: HEP and theraband Plan: Patient agrees to discharge.  Patient goals were partially met. Patient is being discharged due to being pleased with the current functional level.  ?????      Gabriela Eves, PT, DPT

## 2018-03-28 ENCOUNTER — Ambulatory Visit: Payer: Medicare Other | Admitting: *Deleted

## 2018-03-28 DIAGNOSIS — Z952 Presence of prosthetic heart valve: Secondary | ICD-10-CM | POA: Diagnosis not present

## 2018-03-28 DIAGNOSIS — Z5181 Encounter for therapeutic drug level monitoring: Secondary | ICD-10-CM

## 2018-03-28 LAB — POCT INR: INR: 2.1 (ref 2.0–3.0)

## 2018-03-28 NOTE — Patient Instructions (Signed)
Description   Continue taking 1/2 tablet daily except 1 tablet on Tuesdays, Thursdays, and Saturdays. Recheck in 4 weeks. [facility] [phone]

## 2018-03-29 ENCOUNTER — Telehealth: Payer: Self-pay | Admitting: Pharmacist

## 2018-03-29 NOTE — Telephone Encounter (Signed)
Patient called stating she still hasn't heard back from praluent patient assistance. Has one more injection left. Due to inject on Friday. Patient will pick up a sample on Wed around 3:00 if she hasn't heard anything by then.

## 2018-03-30 ENCOUNTER — Telehealth: Payer: Self-pay

## 2018-03-30 NOTE — Telephone Encounter (Signed)
Called to let pt know they are missing proof income and to call [employer] directly or if they have any concerns call our office

## 2018-04-05 ENCOUNTER — Encounter: Payer: Self-pay | Admitting: Cardiovascular Disease

## 2018-04-24 ENCOUNTER — Other Ambulatory Visit: Payer: Self-pay | Admitting: Pharmacist

## 2018-04-24 ENCOUNTER — Telehealth: Payer: Self-pay | Admitting: Pharmacist

## 2018-04-24 MED ORDER — ALIROCUMAB 75 MG/ML ~~LOC~~ SOAJ: 1.0000 "pen " | SUBCUTANEOUS | 11 refills | Status: DC

## 2018-04-24 NOTE — Telephone Encounter (Signed)
Pt is okay with $35 copay and will pick up rx from pharmacy tomorrow. I did send PA approval over to [employer] program as well since her application has been pending for a few months.

## 2018-04-24 NOTE — Telephone Encounter (Signed)
Pt called clinic because she is due for a Praluent shot and has still not heard back from the [employer] program. Submitted rx to pharmacy since [facility] Medicare copays have recently decreased notably. Rx is available for $35 per month. LMOM for pt to see if this is affordable.

## 2018-04-30 ENCOUNTER — Ambulatory Visit (INDEPENDENT_AMBULATORY_CARE_PROVIDER_SITE_OTHER): Payer: Medicare Other | Admitting: Pharmacist

## 2018-04-30 ENCOUNTER — Encounter: Payer: Self-pay | Admitting: Cardiovascular Disease

## 2018-04-30 ENCOUNTER — Other Ambulatory Visit: Payer: Self-pay | Admitting: *Deleted

## 2018-04-30 ENCOUNTER — Ambulatory Visit: Payer: Medicare Other | Admitting: Cardiovascular Disease

## 2018-04-30 VITALS — BP 120/72 | HR 66 | Ht 61.0 in | Wt 141.1 lb

## 2018-04-30 DIAGNOSIS — I2510 Atherosclerotic heart disease of native coronary artery without angina pectoris: Secondary | ICD-10-CM | POA: Diagnosis not present

## 2018-04-30 DIAGNOSIS — Z952 Presence of prosthetic heart valve: Secondary | ICD-10-CM

## 2018-04-30 DIAGNOSIS — Z5181 Encounter for therapeutic drug level monitoring: Secondary | ICD-10-CM

## 2018-04-30 LAB — POCT INR: INR: 2.2 (ref 2.0–3.0)

## 2018-04-30 NOTE — Progress Notes (Signed)
Amanda Wiggins Date of Birth  Feb 25, 1946 [facility] [address]    [facility] [place], [place]  [address] [phone]  Fax  [phone]  Problem List 1. Aortic valve replacement 2. Hyperlipidemia 3. Breast cancer 4. CAD -  1. Mid LAD to Dist LAD lesion, 100% stenosed. Post intervention with a single Synergy DES 2.25 mm x 20 mm S/p mini vision stent to distal LAD     Amanda Wiggins is a 73 year old female with a history of aortic stenosis-status post aortic valve replacement.  She also has a history of hypercholesterolemia.  She's not had any episodes of chest pain or shortness of breath.  She's had her INR levels checked in our Coumadin clinic. Her INR levels have all been therapeutic.  Nov. 6 , 2014  Amanda Wiggins is doing well.  i saw her a year ago.  She is 10 years our from her breast cancer and is doing well.    No cardiac complaints.   Able to do all of her normal activities.     Nov. 13, 2015:  Amanda Wiggins is doing well.   No CP .  No dyspnea.   Has been stressful.  Her mother died this past year.   Her INR levels have been  Well controlled   Nov. 14, 2016:  Doing well.  No cardiac issues.  INR levels have been stable,  A bit low the last time .  Had an AVR 13 years ago .    June 05, 2015:  Amanda Wiggins was recently seen at the hospital for an acute anterior wall ST segment elevation myocardial infarction. She had stenting of her mid LAD. She had recurrent pain on the day of her original discharge and was taken back to the Cath Lab and had a second stent placed. She continued to have some intermittent episodes of chest discomfort. She has had persistent ST segment elevation. Her left ventricular systolic function is moderately depressed with an EF of 40-45%.   She has akinesis of the distal anterior wall and apex.   She is not having the band like chest pain that she had on presentation Has had some GERD.   Asked about going back on Omeprazole  - we discussed her need for Protonix with  the plavix .   September 01, 2015:  Amanda Wiggins is seen back for follow up visit. Still having trouble getting over her MI .  Still fatigued but is slowly getting better.   No CP.      Dec. 12, 2017:  feeling ok Does not have the energy that she wants ( and used to have prior to her MI )  BP has been in the 90-100 range.   September 21, 2016:    Amanda Wiggins is seen today for her AVR and CAD / MI  Her husband, [name], died last week  Has some leg aches.   Feb. 13, 2019: Amanda Wiggins is seen back today for follow up of her AVR and CHF Echo shows LV EF of 40% , mean AV gradient is 21 mmHg.  Is on coumadin and plavix  Has occasional left shoulder pain  Had stenting of LAD in March 2017.    Has had some balance issues.   Saw ENT. Thinks it may be orthostatic hypotension  Had ears cleaned out.   Feels better  Aug. 26, 2019:  Doing well. Has CAD, AVR   No recent CP , Gets winded with little exertion EF is 40%.    Bruises  quite a bit - is on coumadin and plavix   Feb. 24, 2020:  Seen for follow up of her CAD and AVR. Continues to have balance issues.   Lists to one side. She has been going to PT .   Was thought to be due to muscle weakness possibly related to the crestor.   She is on Pralulent  And her balance and muscle strength is better.  Works out at [employer] and is doing yoga    Current Outpatient Medications on File Prior to Visit  Medication Sig Dispense Refill  . Alirocumab (PRALUENT) 75 MG/ML SOAJ Inject 1 pen into the skin every 14 (fourteen) days. 2 pen 11  . BIOTIN PO Take 1 tablet by mouth every morning.     . Calcium Carbonate-Vitamin D (CALCIUM 600+D) 600-400 MG-UNIT per tablet Take 1 tablet by mouth 2 (two) times daily.      . carvedilol (COREG) 3.125 MG tablet TAKE 1 TABLET (3.125 MG TOTAL) BY MOUTH 2 (TWO) TIMES DAILY WITH A MEAL. 180 tablet 2  . clopidogrel (PLAVIX) 75 MG tablet TAKE 1 TABLET (75 MG TOTAL) BY MOUTH DAILY WITH BREAKFAST. 90 tablet 2  . cycloSPORINE (RESTASIS) 0.05 %  ophthalmic emulsion Place 1 drop into both eyes 2 (two) times daily as needed (DRY EYE).     . Desoximetasone 0.05 % GEL Apply 1 application topically as needed (AS NEEDED TO AFFECTED AREA).     . Glucosamine-Chondroit-Vit C-Mn (GLUCOSAMINE CHONDR 1500 COMPLX) CAPS Take 1 capsule by mouth every morning.     . loratadine (CLARITIN) 10 MG tablet Take 10 mg by mouth daily.      . Multiple Vitamin (MULTIVITAMIN) capsule Take 1 capsule by mouth daily.      . nitroGLYCERIN (NITROSTAT) 0.4 MG SL tablet Place 1 tablet (0.4 mg total) under the tongue every 5 (five) minutes as needed for chest pain. 25 tablet 6  . pantoprazole (PROTONIX) 40 MG tablet TAKE 1 TABLET BY MOUTH EVERY DAY 30 tablet 11  . PAZEO 0.7 % SOLN INSTILL 1 DROP INTO BOTH EYES DAILY AS NEEDED FOR ALLERGIES  5  . temazepam (RESTORIL) 30 MG capsule Take 1 capsule (30 mg total) by mouth at bedtime as needed for sleep. 30 capsule 2  . warfarin (COUMADIN) 5 MG tablet TAKE AS DIRECTED BY COUMADIN CLINIC 90 tablet 1   Current Facility-Administered Medications on File Prior to Visit  Medication Dose Route Frequency Provider Last Rate Last Dose  . 0.9 %  sodium chloride infusion  500 mL Intravenous Continuous Gatha Mayer, MD        Allergies  Allergen Reactions  . Meperidine Nausea Only  . Meperidine Hcl Nausea Only  . Sulfa Antibiotics Nausea Only  . Crestor [Rosuvastatin Calcium]     Myalgias on 10mg  and 20mg  daily  . Latex Hives  . Meperidine Hcl Nausea And Vomiting  . Pravastatin     myalgias  . Sulfamethoxazole Nausea And Vomiting    REACTION: unspecified    Past Medical History:  Diagnosis Date  . Aortic valve replaced   . Breast cancer ([place]) 2005  . FHx: migraine headaches   . GERD (gastroesophageal reflux disease)   . Hiatal hernia   . Hx Breast Cancer, IDC, Stage I 07/03/2003  . Hx of breast cancer   . Hyperlipidemia   . Hypertension   . Menopausal syndrome   . Osteopenia   . Osteoporosis due to aromatase  inhibitor 08/22/2011  . Personal history  of radiation therapy   . Status post aortic valve repair     Past Surgical History:  Procedure Laterality Date  . AORTIC VALVE REPLACEMENT  2003  . BREAST BIOPSY    . BREAST LUMPECTOMY Left 2005  . CARDIAC CATHETERIZATION     Ejection Fraction   . CARDIAC CATHETERIZATION N/A 05/16/2015   Procedure: Left Heart Cath and Coronary Angiography;  Surgeon: Leonie Man, MD;  Location: [facility];  Service: Cardiovascular;  Laterality: N/A;  . CARDIAC CATHETERIZATION  05/16/2015   Procedure: Coronary Stent Intervention;  Surgeon: Leonie Man, MD;  Location: [facility];  Service: Cardiovascular;;  . CARDIAC CATHETERIZATION N/A 05/19/2015   Procedure: Left Heart Cath and Coronary Angiography;  Surgeon: Troy Sine, MD;  Location: [facility];  Service: Cardiovascular;  Laterality: N/A;  . CARDIAC CATHETERIZATION N/A 05/19/2015   Procedure: Coronary Stent Intervention;  Surgeon: Troy Sine, MD;  Location: [facility];  Service: Cardiovascular;  Laterality: N/A;  . HIATAL HERNIA REPAIR    . HIATAL HERNIA REPAIR  05/11/2007  . hysterectomy, endometiral cancer  2001  . lummpectomy breast cancer  07/03/2003  . nissan fundoplicaiton  4315   post-op hematoma on heparin   . right shouler surgery      Social History   Tobacco Use  Smoking Status Never Smoker  Smokeless Tobacco Never Used    Social History   Substance and Sexual Activity  Alcohol Use Yes  . Alcohol/week: 0.0 standard drinks   Comment: socially    Family History  Problem Relation Age of Onset  . Heart attack Mother 35  . Osteoarthritis Brother        hpercholesterolemia    Reviw of Systems:  Noted in current history, otherwise review of systems is negative.   Physical Exam: Blood pressure 120/72, pulse 66, height 5\' 1"  (1.549 m), weight 141 lb 1.9 oz (64 kg), SpO2 98 %.  GEN:    Middle age  15: Normal NECK: No JVD; No carotid  bruits LYMPHATICS: No lymphadenopathy CARDIAC: RRR , mechanical S2  soft  Systolic murmur  RESPIRATORY:  Clear to auscultation without rales, wheezing or rhonchi  ABDOMEN: Soft, non-tender, non-distended MUSCULOSKELETAL:  No edema; No deformity  SKIN: Warm and dry NEUROLOGIC:  Alert and oriented x 3   ECG:      Assessment / Plan:   1. Aortic valve replacement -     her valve sounds nice and crisp.  Continue Coumadin.  She has her INR levels checked in our Coumadin clinic.   2. Hyperlipidemia -    Check lipids, liver enz, and BMP at her nex visit   3. Breast cancer -     4. CAD  - no recent angina   5. Chronic systolic CHF:   EF i s 40-08%.          Mertie Moores, MD  04/30/2018 9:37 AM    [address],  [place], [place]  [address] Pager [phone] Phone: [phone]; Fax: [phone]

## 2018-04-30 NOTE — Patient Instructions (Signed)

## 2018-04-30 NOTE — Patient Instructions (Signed)
Description    Continue taking 1/2 tablet daily except 1 tablet on Tuesdays, Thursdays, and Saturdays. Recheck in 6 weeks. Coumadin Clinic [phone]    

## 2018-05-02 ENCOUNTER — Other Ambulatory Visit: Payer: Self-pay | Admitting: Cardiovascular Disease

## 2018-05-17 ENCOUNTER — Other Ambulatory Visit: Payer: Self-pay | Admitting: Obstetrics

## 2018-05-17 DIAGNOSIS — Z1231 Encounter for screening mammogram for malignant neoplasm of breast: Secondary | ICD-10-CM

## 2018-05-18 ENCOUNTER — Other Ambulatory Visit: Payer: Self-pay | Admitting: Hematology & Oncology

## 2018-05-18 DIAGNOSIS — Z853 Personal history of malignant neoplasm of breast: Secondary | ICD-10-CM

## 2018-05-20 ENCOUNTER — Other Ambulatory Visit: Payer: Self-pay | Admitting: Cardiovascular Disease

## 2018-05-31 ENCOUNTER — Encounter: Payer: Self-pay | Admitting: *Deleted

## 2018-06-20 ENCOUNTER — Telehealth: Payer: Self-pay

## 2018-06-20 NOTE — Telephone Encounter (Signed)
lmom for prescreen/drive thru aware 

## 2018-06-20 NOTE — Telephone Encounter (Signed)

## 2018-06-21 ENCOUNTER — Ambulatory Visit (INDEPENDENT_AMBULATORY_CARE_PROVIDER_SITE_OTHER): Payer: Medicare Other | Admitting: *Deleted

## 2018-06-21 ENCOUNTER — Other Ambulatory Visit: Payer: Self-pay

## 2018-06-21 DIAGNOSIS — Z952 Presence of prosthetic heart valve: Secondary | ICD-10-CM | POA: Diagnosis not present

## 2018-06-21 DIAGNOSIS — Z5181 Encounter for therapeutic drug level monitoring: Secondary | ICD-10-CM | POA: Diagnosis not present

## 2018-06-21 LAB — POCT INR: INR: 2.7 (ref 2.0–3.0)

## 2018-06-21 NOTE — Patient Instructions (Signed)
Description   Spoke with pt and instructed pt to continue taking 1/2 tablet daily except 1 tablet on Tuesdays, Thursdays, and Saturdays. Recheck in 6 weeks. Coumadin Clinic [phone]

## 2018-07-05 ENCOUNTER — Ambulatory Visit: Payer: Medicare Other

## 2018-07-23 ENCOUNTER — Encounter: Payer: Medicare Other | Admitting: Family Medicine

## 2018-08-01 ENCOUNTER — Telehealth: Payer: Self-pay

## 2018-08-01 NOTE — Telephone Encounter (Signed)
LMOM FOR PRESCREEN  

## 2018-08-01 NOTE — Telephone Encounter (Signed)

## 2018-08-02 ENCOUNTER — Other Ambulatory Visit: Payer: Self-pay

## 2018-08-02 ENCOUNTER — Ambulatory Visit (INDEPENDENT_AMBULATORY_CARE_PROVIDER_SITE_OTHER): Payer: Medicare Other | Admitting: *Deleted

## 2018-08-02 DIAGNOSIS — Z5181 Encounter for therapeutic drug level monitoring: Secondary | ICD-10-CM

## 2018-08-02 DIAGNOSIS — Z952 Presence of prosthetic heart valve: Secondary | ICD-10-CM | POA: Diagnosis not present

## 2018-08-02 LAB — POCT INR: INR: 2.8 (ref 2.0–3.0)

## 2018-08-02 NOTE — Patient Instructions (Signed)
Description   Spoke with pt and instructed pt to continue taking 1/2 tablet daily except 1 tablet on Tuesdays, Thursdays, and Saturdays. Recheck in 6 weeks. Coumadin Clinic [phone]

## 2018-08-13 ENCOUNTER — Other Ambulatory Visit: Payer: Self-pay

## 2018-08-13 ENCOUNTER — Encounter: Payer: Self-pay | Admitting: Hematology & Oncology

## 2018-08-13 ENCOUNTER — Inpatient Hospital Stay: Payer: Medicare Other | Attending: Hematology & Oncology | Admitting: Hematology & Oncology

## 2018-08-13 ENCOUNTER — Inpatient Hospital Stay: Payer: Medicare Other

## 2018-08-13 DIAGNOSIS — Z79899 Other long term (current) drug therapy: Secondary | ICD-10-CM | POA: Diagnosis not present

## 2018-08-13 DIAGNOSIS — Z952 Presence of prosthetic heart valve: Secondary | ICD-10-CM | POA: Insufficient documentation

## 2018-08-13 DIAGNOSIS — Z885 Allergy status to narcotic agent status: Secondary | ICD-10-CM | POA: Insufficient documentation

## 2018-08-13 DIAGNOSIS — Z853 Personal history of malignant neoplasm of breast: Secondary | ICD-10-CM

## 2018-08-13 DIAGNOSIS — Z7901 Long term (current) use of anticoagulants: Secondary | ICD-10-CM | POA: Diagnosis not present

## 2018-08-13 DIAGNOSIS — Z882 Allergy status to sulfonamides status: Secondary | ICD-10-CM | POA: Diagnosis not present

## 2018-08-13 DIAGNOSIS — M818 Other osteoporosis without current pathological fracture: Secondary | ICD-10-CM

## 2018-08-13 LAB — CBC WITH DIFFERENTIAL (CANCER CENTER ONLY)
Abs Immature Granulocytes: 0.04 10*3/uL (ref 0.00–0.07)
Basophils Absolute: 0 10*3/uL (ref 0.0–0.1)
Basophils Relative: 0 %
Eosinophils Absolute: 0.2 10*3/uL (ref 0.0–0.5)
Eosinophils Relative: 3 %
HCT: 46.9 % — ABNORMAL HIGH (ref 36.0–46.0)
Hemoglobin: 14.9 g/dL (ref 12.0–15.0)
Immature Granulocytes: 1 %
Lymphocytes Relative: 24 %
Lymphs Abs: 1.6 10*3/uL (ref 0.7–4.0)
MCH: 28 pg (ref 26.0–34.0)
MCHC: 31.8 g/dL (ref 30.0–36.0)
MCV: 88 fL (ref 80.0–100.0)
Monocytes Absolute: 0.6 10*3/uL (ref 0.1–1.0)
Monocytes Relative: 8 %
Neutro Abs: 4.5 10*3/uL (ref 1.7–7.7)
Neutrophils Relative %: 64 %
Platelet Count: 374 10*3/uL (ref 150–400)
RBC: 5.33 MIL/uL — ABNORMAL HIGH (ref 3.87–5.11)
RDW: 12.8 % (ref 11.5–15.5)
WBC Count: 6.9 10*3/uL (ref 4.0–10.5)
nRBC: 0 % (ref 0.0–0.2)

## 2018-08-13 LAB — CMP (CANCER CENTER ONLY)
ALT: 22 U/L (ref 0–44)
AST: 23 U/L (ref 15–41)
Albumin: 4.6 g/dL (ref 3.5–5.0)
Alkaline Phosphatase: 57 U/L (ref 38–126)
Anion gap: 10 (ref 5–15)
BUN: 28 mg/dL — ABNORMAL HIGH (ref 8–23)
CO2: 27 mmol/L (ref 22–32)
Calcium: 9.8 mg/dL (ref 8.9–10.3)
Chloride: 102 mmol/L (ref 98–111)
Creatinine: 1.09 mg/dL — ABNORMAL HIGH (ref 0.44–1.00)
GFR, Est AFR Am: 59 mL/min — ABNORMAL LOW (ref >60)
GFR, Estimated: 51 mL/min — ABNORMAL LOW (ref >60)
Glucose, Bld: 141 mg/dL — ABNORMAL HIGH (ref 70–99)
Potassium: 4.4 mmol/L (ref 3.5–5.1)
Sodium: 139 mmol/L (ref 135–145)
Total Bilirubin: 0.6 mg/dL (ref 0.3–1.2)
Total Protein: 7.1 g/dL (ref 6.5–8.1)

## 2018-08-13 LAB — PROTIME-INR
INR: 2.3 — ABNORMAL HIGH (ref 0.8–1.2)
Prothrombin Time: 25 seconds — ABNORMAL HIGH (ref 11.4–15.2)

## 2018-08-13 MED ORDER — TEMAZEPAM 30 MG PO CAPS: 30.0000 mg | ORAL_CAPSULE | Freq: Every evening | ORAL | 2 refills | Status: DC | PRN

## 2018-08-13 MED ORDER — ZOLEDRONIC ACID 4 MG/100ML IV SOLN
4.0000 mg | Freq: Once | INTRAVENOUS | Status: AC
  Administered 2018-08-13: 4 mg via INTRAVENOUS
  Filled 2018-08-13: qty 100

## 2018-08-13 MED ORDER — SODIUM CHLORIDE 0.9 % IV SOLN
Freq: Once | INTRAVENOUS | Status: AC
  Administered 2018-08-13: 11:00:00 via INTRAVENOUS
  Filled 2018-08-13: qty 250

## 2018-08-13 NOTE — Progress Notes (Signed)
Hematology and Oncology Follow Up Visit  RIKI BERNINGER [id number] 01-Mar-1946 73 y.o. 08/13/2018   Principle Diagnosis:  1. Stage I (T1 N0 M0) ductal carcinoma of the left breast. 2. Mechanical aortic valve.  Current Therapy:    Coumadin-lifelong  Zometa 5 mg IV q. Year - given in June 2020     Interim History:  Ms.  Cordell is back for followup. She is doing okay.  She.  See her yearly.  Thankfully, she is making it through the coronavirus.  Her heart is doing okay.  Her INR today with her being on Coumadin was 2.3.  She has had no issues with fever.  She is had no rashes.  She has had no leg swelling.  There is been no change in bowel or bladder habits.  She will have her mammogram this summer.  She usually has April but again with the coronavirus things have been pushed back.  There is been no bleeding.  Overall, her performance status is ECOG 0.  Medications:  Current Outpatient Medications:  .  Alirocumab (PRALUENT) 75 MG/ML SOAJ, Inject 1 pen into the skin every 14 (fourteen) days., Disp: 2 pen, Rfl: 11 .  amoxicillin (AMOXIL) 500 MG capsule, TAKE 4 CAPSULES BY MOUTH ONE HOUR PRIOR TO APPOINTMENT, Disp: , Rfl:  .  BIOTIN PO, Take 1 tablet by mouth every morning. , Disp: , Rfl:  .  Calcium Carbonate-Vitamin D (CALCIUM 600+D) 600-400 MG-UNIT per tablet, Take 1 tablet by mouth 2 (two) times daily.  , Disp: , Rfl:  .  carvedilol (COREG) 3.125 MG tablet, TAKE 1 TABLET (3.125 MG TOTAL) BY MOUTH 2 (TWO) TIMES DAILY WITH A MEAL., Disp: 180 tablet, Rfl: 1 .  clopidogrel (PLAVIX) 75 MG tablet, TAKE 1 TABLET (75 MG TOTAL) BY MOUTH DAILY WITH BREAKFAST., Disp: 90 tablet, Rfl: 2 .  cycloSPORINE (RESTASIS) 0.05 % ophthalmic emulsion, Place 1 drop into both eyes 2 (two) times daily as needed (DRY EYE). , Disp: , Rfl:  .  Desoximetasone 0.05 % GEL, Apply 1 application topically as needed (AS NEEDED TO AFFECTED AREA). , Disp: , Rfl:  .  Glucosamine-Chondroit-Vit C-Mn (GLUCOSAMINE CHONDR  1500 COMPLX) CAPS, Take 1 capsule by mouth every morning. , Disp: , Rfl:  .  loratadine (CLARITIN) 10 MG tablet, Take 10 mg by mouth daily.  , Disp: , Rfl:  .  Multiple Vitamin (MULTIVITAMIN) capsule, Take 1 capsule by mouth daily.  , Disp: , Rfl:  .  nitroGLYCERIN (NITROSTAT) 0.4 MG SL tablet, Place 1 tablet (0.4 mg total) under the tongue every 5 (five) minutes as needed for chest pain., Disp: 25 tablet, Rfl: 6 .  pantoprazole (PROTONIX) 40 MG tablet, TAKE 1 TABLET BY MOUTH EVERY DAY, Disp: 90 tablet, Rfl: 1 .  PAZEO 0.7 % SOLN, INSTILL 1 DROP INTO BOTH EYES DAILY AS NEEDED FOR ALLERGIES, Disp: , Rfl: 5 .  temazepam (RESTORIL) 30 MG capsule, TAKE 1 CAPSULE (30 MG TOTAL) BY MOUTH AT BEDTIME AS NEEDED FOR SLEEP., Disp: 30 capsule, Rfl: 2 .  warfarin (COUMADIN) 5 MG tablet, TAKE AS DIRECTED BY COUMADIN CLINIC, Disp: 90 tablet, Rfl: 1  Current Facility-Administered Medications:  .  0.9 %  sodium chloride infusion, 500 mL, Intravenous, Continuous, Gatha Mayer, MD  Allergies:  Allergies  Allergen Reactions  . Meperidine Nausea Only  . Meperidine Hcl Nausea Only  . Sulfa Antibiotics Nausea Only  . Crestor [Rosuvastatin Calcium]     Myalgias on 10mg  and 20mg  daily  .  Latex Hives  . Meperidine Hcl Nausea And Vomiting  . Pravastatin     myalgias  . Sulfamethoxazole Nausea And Vomiting    REACTION: unspecified    Past Medical History, Surgical history, Social history, and Family History were reviewed and updated.  Review of Systems: Review of Systems  Constitutional: Negative.   HENT: Negative.   Eyes: Negative.   Cardiovascular: Negative.   Gastrointestinal: Negative.   Genitourinary: Negative.   Musculoskeletal: Negative.   Skin: Negative.   Neurological: Negative.   Endo/Heme/Allergies: Negative.   Psychiatric/Behavioral: Negative.      Physical Exam:  vitals were not taken for this visit.   Physical Exam Vitals signs reviewed.  HENT:     Head: Normocephalic and  atraumatic.  Eyes:     Pupils: Pupils are equal, round, and reactive to light.  Neck:     Musculoskeletal: Normal range of motion.  Cardiovascular:     Rate and Rhythm: Normal rate and regular rhythm.     Heart sounds: Normal heart sounds.  Pulmonary:     Effort: Pulmonary effort is normal.     Breath sounds: Normal breath sounds.  Abdominal:     General: Bowel sounds are normal.     Palpations: Abdomen is soft.  Musculoskeletal: Normal range of motion.        General: No tenderness or deformity.  Lymphadenopathy:     Cervical: No cervical adenopathy.  Skin:    General: Skin is warm and dry.     Findings: No erythema or rash.  Neurological:     Mental Status: She is alert and oriented to person, place, and time.  Psychiatric:        Behavior: Behavior normal.        Thought Content: Thought content normal.        Judgment: Judgment normal.     Lab Results  Component Value Date   WBC 6.9 08/13/2018   HGB 14.9 08/13/2018   HCT 46.9 (H) 08/13/2018   MCV 88.0 08/13/2018   PLT 374 08/13/2018     Chemistry      Component Value Date/Time   NA 139 08/13/2018 0920   NA 141 10/30/2017 0928   NA 147 (H) 02/27/2017 0952   NA 142 02/12/2015 1125   K 4.4 08/13/2018 0920   K 4.3 02/27/2017 0952   K 3.8 02/12/2015 1125   CL 102 08/13/2018 0920   CL 106 02/27/2017 0952   CO2 27 08/13/2018 0920   CO2 30 02/27/2017 0952   CO2 24 02/12/2015 1125   BUN 28 (H) 08/13/2018 0920   BUN 16 10/30/2017 0928   BUN 14 02/27/2017 0952   BUN 14.7 02/12/2015 1125   CREATININE 1.09 (H) 08/13/2018 0920   CREATININE 0.9 02/27/2017 0952   CREATININE 1.0 02/12/2015 1125      Component Value Date/Time   CALCIUM 9.8 08/13/2018 0920   CALCIUM 9.2 02/27/2017 0952   CALCIUM 10.2 02/12/2015 1125   ALKPHOS 57 08/13/2018 0920   ALKPHOS 55 02/27/2017 0952   ALKPHOS 56 02/12/2015 1125   AST 23 08/13/2018 0920   AST 35 (H) 02/12/2015 1125   ALT 22 08/13/2018 0920   ALT 22 02/27/2017 0952   ALT  39 02/12/2015 1125   BILITOT 0.6 08/13/2018 0920   BILITOT 0.45 02/12/2015 1125       Impression and Plan: Ms. Wehrli is 73 year old female with a history of stage I infiltrating duct carcinoma the left breast. She underwent lumpectomy.  She  had been on Femara. She had radiation. It has been 13  years now.  Everything looks good right now.  I do not see any problems from a malignant point of view.  I do not see any evidence of recurrent breast cancer.  We will go ahead and plan to get her back in 12 months.    We will see her back, we will do her Zometa.  I refilled her Restoril.  Marland KitchenVolanda Napoleon, MD 6/8/202010:16 AM

## 2018-08-13 NOTE — Patient Instructions (Signed)
Zoledronic Acid injection (Hypercalcemia, Oncology) (Zometa) What is this medicine? ZOLEDRONIC ACID (ZOE le dron ik AS id) lowers the amount of calcium loss from bone. It is used to treat too much calcium in your blood from cancer. It is also used to prevent complications of cancer that has spread to the bone. This medicine may be used for other purposes; ask your health care provider or pharmacist if you have questions. COMMON BRAND NAME(S): Zometa What should I tell my health care provider before I take this medicine? They need to know if you have any of these conditions: -aspirin-sensitive asthma -cancer, especially if you are receiving medicines used to treat cancer -dental disease or wear dentures -infection -kidney disease -receiving corticosteroids like dexamethasone or prednisone -an unusual or allergic reaction to zoledronic acid, other medicines, foods, dyes, or preservatives -pregnant or trying to get pregnant -breast-feeding How should I use this medicine? This medicine is for infusion into a vein. It is given by a health care professional in a hospital or clinic setting. Talk to your pediatrician regarding the use of this medicine in children. Special care may be needed. Overdosage: If you think you have taken too much of this medicine contact a poison control center or emergency room at once. NOTE: This medicine is only for you. Do not share this medicine with others. What if I miss a dose? It is important not to miss your dose. Call your doctor or health care professional if you are unable to keep an appointment. What may interact with this medicine? -certain antibiotics given by injection -NSAIDs, medicines for pain and inflammation, like ibuprofen or naproxen -some diuretics like bumetanide, furosemide -teriparatide -thalidomide This list may not describe all possible interactions. Give your health care provider a list of all the medicines, herbs, non-prescription drugs,  or dietary supplements you use. Also tell them if you smoke, drink alcohol, or use illegal drugs. Some items may interact with your medicine. What should I watch for while using this medicine? Visit your doctor or health care professional for regular checkups. It may be some time before you see the benefit from this medicine. Do not stop taking your medicine unless your doctor tells you to. Your doctor may order blood tests or other tests to see how you are doing. Women should inform their doctor if they wish to become pregnant or think they might be pregnant. There is a potential for serious side effects to an unborn child. Talk to your health care professional or pharmacist for more information. You should make sure that you get enough calcium and vitamin D while you are taking this medicine. Discuss the foods you eat and the vitamins you take with your health care professional. Some people who take this medicine have severe bone, joint, and/or muscle pain. This medicine may also increase your risk for jaw problems or a broken thigh bone. Tell your doctor right away if you have severe pain in your jaw, bones, joints, or muscles. Tell your doctor if you have any pain that does not go away or that gets worse. Tell your dentist and dental surgeon that you are taking this medicine. You should not have major dental surgery while on this medicine. See your dentist to have a dental exam and fix any dental problems before starting this medicine. Take good care of your teeth while on this medicine. Make sure you see your dentist for regular follow-up appointments. What side effects may I notice from receiving this medicine? Side effects   that you should report to your doctor or health care professional as soon as possible: -allergic reactions like skin rash, itching or hives, swelling of the face, lips, or tongue -anxiety, confusion, or depression -breathing problems -changes in vision -eye pain -feeling faint  or lightheaded, falls -jaw pain, especially after dental work -mouth sores -muscle cramps, stiffness, or weakness -redness, blistering, peeling or loosening of the skin, including inside the mouth -trouble passing urine or change in the amount of urine Side effects that usually do not require medical attention (report to your doctor or health care professional if they continue or are bothersome): -bone, joint, or muscle pain -constipation -diarrhea -fever -hair loss -irritation at site where injected -loss of appetite -nausea, vomiting -stomach upset -trouble sleeping -trouble swallowing -weak or tired This list may not describe all possible side effects. Call your doctor for medical advice about side effects. You may report side effects to FDA at 1-800-FDA-1088. Where should I keep my medicine? This drug is given in a hospital or clinic and will not be stored at home. NOTE: This sheet is a summary. It may not cover all possible information. If you have questions about this medicine, talk to your doctor, pharmacist, or health care provider.  2019 Elsevier/Gold Standard (2013-07-20 14:19:39)

## 2018-08-15 ENCOUNTER — Other Ambulatory Visit: Payer: Self-pay | Admitting: Cardiovascular Disease

## 2018-08-20 ENCOUNTER — Other Ambulatory Visit: Payer: Self-pay

## 2018-08-20 ENCOUNTER — Ambulatory Visit
Admission: RE | Admit: 2018-08-20 | Discharge: 2018-08-20 | Disposition: A | Discharge status: Home / Self Care | Payer: Medicare Other | Source: Ambulatory Visit | Attending: Obstetrics | Admitting: Obstetrics

## 2018-08-20 DIAGNOSIS — Z1231 Encounter for screening mammogram for malignant neoplasm of breast: Secondary | ICD-10-CM

## 2018-09-05 ENCOUNTER — Encounter: Payer: Self-pay | Admitting: Family Medicine

## 2018-09-05 ENCOUNTER — Other Ambulatory Visit: Payer: Self-pay

## 2018-09-05 ENCOUNTER — Ambulatory Visit (INDEPENDENT_AMBULATORY_CARE_PROVIDER_SITE_OTHER): Payer: Medicare Other | Admitting: Family Medicine

## 2018-09-05 VITALS — BP 130/90 | HR 62 | Temp 98.2°F | Ht 61.5 in | Wt 142.0 lb

## 2018-09-05 DIAGNOSIS — Z853 Personal history of malignant neoplasm of breast: Secondary | ICD-10-CM

## 2018-09-05 DIAGNOSIS — R7309 Other abnormal glucose: Secondary | ICD-10-CM | POA: Diagnosis not present

## 2018-09-05 DIAGNOSIS — Z7901 Long term (current) use of anticoagulants: Secondary | ICD-10-CM

## 2018-09-05 DIAGNOSIS — Z0000 Encounter for general adult medical examination without abnormal findings: Secondary | ICD-10-CM

## 2018-09-05 DIAGNOSIS — H6121 Impacted cerumen, right ear: Secondary | ICD-10-CM

## 2018-09-05 DIAGNOSIS — I5022 Chronic systolic (congestive) heart failure: Secondary | ICD-10-CM

## 2018-09-05 DIAGNOSIS — I10 Essential (primary) hypertension: Secondary | ICD-10-CM | POA: Diagnosis not present

## 2018-09-05 DIAGNOSIS — E782 Mixed hyperlipidemia: Secondary | ICD-10-CM

## 2018-09-05 LAB — LIPID PANEL
Cholesterol: 132 mg/dL (ref 0–200)
HDL: 44.5 mg/dL (ref >39.00)
NonHDL: 87.05
Total CHOL/HDL Ratio: 3
Triglycerides: 272 mg/dL — ABNORMAL HIGH (ref 0.0–149.0)
VLDL: 54.4 mg/dL — ABNORMAL HIGH (ref 0.0–40.0)

## 2018-09-05 LAB — LDL CHOLESTEROL, DIRECT: Direct LDL: 54 mg/dL

## 2018-09-05 LAB — HEMOGLOBIN A1C: Hgb A1c MFr Bld: 6.5 % (ref 4.6–6.5)

## 2018-09-05 NOTE — Patient Instructions (Signed)
Preventive Care 38 Years and Older, Female Preventive care refers to lifestyle choices and visits with your health care provider that can promote health and wellness. This includes:  A yearly physical exam. This is also called an annual well check.  Regular dental and eye exams.  Immunizations.  Screening for certain conditions.  Healthy lifestyle choices, such as diet and exercise. What can I expect for my preventive care visit? Physical exam Your health care provider will check:  Height and weight. These may be used to calculate body mass index (BMI), which is a measurement that tells if you are at a healthy weight.  Heart rate and blood pressure.  Your skin for abnormal spots. Counseling Your health care provider may ask you questions about:  Alcohol, tobacco, and drug use.  Emotional well-being.  Home and relationship well-being.  Sexual activity.  Eating habits.  History of falls.  Memory and ability to understand (cognition).  Work and work Statistician.  Pregnancy and menstrual history. What immunizations do I need?  Influenza (flu) vaccine  This is recommended every year. Tetanus, diphtheria, and pertussis (Tdap) vaccine  You may need a Td booster every 10 years. Varicella (chickenpox) vaccine  You may need this vaccine if you have not already been vaccinated. Zoster (shingles) vaccine  You may need this after age 72. Pneumococcal conjugate (PCV13) vaccine  One dose is recommended after age 72. Pneumococcal polysaccharide (PPSV23) vaccine  One dose is recommended after age 72. Measles, mumps, and rubella (MMR) vaccine  You may need at least one dose of MMR if you were born in 1956 or later. You may also need a second dose. Meningococcal conjugate (MenACWY) vaccine  You may need this if you have certain conditions. Hepatitis A vaccine  You may need this if you have certain conditions or if you travel or work in places where you may be exposed  to hepatitis A. Hepatitis B vaccine  You may need this if you have certain conditions or if you travel or work in places where you may be exposed to hepatitis B. Haemophilus influenzae type b (Hib) vaccine  You may need this if you have certain conditions. You may receive vaccines as individual doses or as more than one vaccine together in one shot (combination vaccines). Talk with your health care provider about the risks and benefits of combination vaccines. What tests do I need? Blood tests  Lipid and cholesterol levels. These may be checked every 5 years, or more frequently depending on your overall health.  Hepatitis C test.  Hepatitis B test. Screening  Lung cancer screening. You may have this screening every year starting at age 72 if you have a 30-pack-year history of smoking and currently smoke or have quit within the past 15 years.  Colorectal cancer screening. All adults should have this screening starting at age 72 and continuing until age 15. Your health care provider may recommend screening at age 23 if you are at increased risk. You will have tests every 1-10 years, depending on your results and the type of screening test.  Diabetes screening. This is done by checking your blood sugar (glucose) after you have not eaten for a while (fasting). You may have this done every 1-3 years.  Mammogram. This may be done every 1-2 years. Talk with your health care provider about how often you should have regular mammograms.  BRCA-related cancer screening. This may be done if you have a family history of breast, ovarian, tubal, or peritoneal cancers.  Other tests  Sexually transmitted disease (STD) testing.  Bone density scan. This is done to screen for osteoporosis. You may have this done starting at age 25. Follow these instructions at home: Eating and drinking  Eat a diet that includes fresh fruits and vegetables, whole grains, lean protein, and low-fat dairy products. Limit  your intake of foods with high amounts of sugar, saturated fats, and salt.  Take vitamin and mineral supplements as recommended by your health care provider.  Do not drink alcohol if your health care provider tells you not to drink.  If you drink alcohol: ? Limit how much you have to 0-1 drink a day. ? Be aware of how much alcohol is in your drink. In the [place] one drink equals one 12 oz bottle of beer (355 mL), one 5 oz glass of wine (148 mL), or one 1 oz glass of hard liquor (44 mL). Lifestyle  Take daily care of your teeth and gums.  Stay active. Exercise for at least 30 minutes on 5 or more days each week.  Do not use any products that contain nicotine or tobacco, such as cigarettes, e-cigarettes, and chewing tobacco. If you need help quitting, ask your health care provider.  If you are sexually active, practice safe sex. Use a condom or other form of protection in order to prevent STIs (sexually transmitted infections).  Talk with your health care provider about taking a low-dose aspirin or statin. What's next?  Go to your health care provider once a year for a well check visit.  Ask your health care provider how often you should have your eyes and teeth checked.  Stay up to date on all vaccines. This information is not intended to replace advice given to you by your health care provider. Make sure you discuss any questions you have with your health care provider. Document Released: 03/20/2015 Document Revised: 02/15/2018 Document Reviewed: 02/15/2018 Elsevier Patient Education  2020 [place] Surviv[facility] Follow-up The goal of treatment for breast cancer is to get rid of all cancer cells in the body, but sometimes a few cells remain. These cells can grow and cause the cancer to return (recur) later. If this happens, the goal is to find the cancer as soon as possible. Cancer can recur just a few months after treatment or years later. Most cases of recurrent  breast cancer develop within 5 years after treatment. Will my cancer return? There is no way to know if your breast cancer will return. However, your chance of developing recurrent breast cancer is greater if you had:  Breast cancer before 72 years of age.  Breast cancer that spread to the lymph nodes.  A tumor that was bigger than 2 inches (5 cm).  A high-grade tumor. These are tumors that have cells that grow more quickly than other types of tumors.  A close tumor margin. This means that the space between the tumor and normal, noncancerous cells was small.  Inflammatory breast cancer. This is an aggressive form of cancer in which cancer cells block the lymph vessels in the skin of the breast.  Human epidermal growth factor (HER2) positive breast cancer. This is a type of cancer that grows as a result of the HER2 protein.  Surgery to remove the tumor but not the entire breast (lumpectomy) without radiation therapy. What are the symptoms of recurrent breast cancer? Examine your breasts every month. You may find it helpful to do this on the same day each  month. [place] your calendar as a reminder. Let your health care provider know immediately if you have any signs or symptoms of recurrent breast cancer. Signs and symptoms of recurrent breast cancer vary. Symptoms will depend on where the cancer is and how the original cancer was treated. Recurrence in the same spot or the opposite breast Symptoms of a cancer that comes back in the same spot (local recurrence) after a lumpectomy, or a recurrence in the opposite breast, may include:  A new lump or thickening in the breast.  A change in the way that the skin of the breast looks, such as a rash, dimpling, or wrinkling.  Redness or swelling of the breast.  Changes in the nipple. It may be leaking fluid, or it may be red, puckered, or swollen. Recurrence after a mastectomy Symptoms of a recurrence after breast removal surgery (mastectomy) may  include:  A lump or thickening under the skin.  A thickening around the mastectomy scar. Recurrence in the lymph nodes Symptoms of a cancer that comes back in the lymph nodes near the breast (regional recurrence) may include:  A lump under the arm or above the collarbone.  Swelling of the arm.  Pain in the arm, shoulder, or chest.  Numbness in the hand or arm. Recurrence in a different part of the body Symptoms of cancer that comes back in an area of the body far away from the original cancer site (distant recurrence) may include:  A cough that does not go away.  Trouble breathing or shortness of breath.  Pain in the bones, the spine, or the chest. This is pain that lasts or does not improve with rest and medicine.  Headaches.  Sudden vision problems.  Dizziness.  Nausea or vomiting.  Weight loss.  Abdominal pain that does not go away.  Yellowing of the skin or eyes (jaundice).  Blood in the urine or bloody vaginal discharge. Following up with your health care provider Most people continue to see their cancer specialist (oncologist) every 3-6 months for the first year after cancer treatment. Ask about your cancer survivor plan of care that outlines your follow-up care after treatment ends. See your primary care provider for regular checkups during this time.  Ask your oncologist: ? How often you should have follow-up visits. ? What symptoms to watch for. ? What to do and whom to call about any symptoms. ? What tests should be done.  Keep a schedule of appointments for the tests and exams that you need, including physical exams, breast exams, and exams of the lymph nodes.  For the first 3 years after being treated for breast cancer, see your health care provider every 3-6 months.  In the fourth and fifth years after being treated for breast cancer, see your health care provider every 6-12 months.  Starting at 5 years after your breast cancer treatment, see your  health care provider at least once a year.  Continue to have regular breast X-rays (mammograms), even if you had a mastectomy. ? Get a mammogram 1 year after the mammogram that first detected breast cancer. ? Get a mammogram every 6-12 months after that or as often as your health care provider suggests.  Have a pelvic exam every year or as often as your health care provider suggests.  Have other cancer screening tests as recommended, such as skin cancer screening and colorectal cancer screening.  Some tests are not recommended for routine screening. Most people recovering from breast cancer do not  need to have these tests if there are no problems. The tests have risks, such as radiation exposure, and can be costly. The risks of some of the following tests are thought to be greater than the benefits: ? Blood tests. ? Chest X-rays. ? Bone scans. ? Liver ultrasound. ? CT scan. ? MRI. ? Positron emission tomography (PET scan). Where to find more information  American Cancer Society: www.cancer.[place]: www.cancer.gov Contact a health care provider if:  You have any signs or symptoms of recurrent breast cancer.  You are taking a medicine prescribed to treat your breast cancer and you have vaginal bleeding.  You discover new lumps or changes in your breast.  You have headaches or have pain in your bones, spine, chest, or abdomen.  You have shortness of breath.  You have a cough that does not go away.  You have discharge from your nipple.  You have a rash on your breast. Get help right away if:  You have trouble breathing.  You have chest pain. Summary  Most cases of recurrent breast cancer develop within 5 years after treatment.  Examine your breasts every month. Let your health care provider know immediately if you have any signs or symptoms of recurrent breast cancer.  Keep a schedule of appointments for the tests and exams that you need,  including physical exams, breast exams, and exams of the lymph nodes.  Continue to have regular breast X-rays (mammograms), even if you had a mastectomy. This information is not intended to replace advice given to you by your health care provider. Make sure you discuss any questions you have with your health care provider. Document Released: 10/20/2010 Document Revised: 02/03/2017 Document Reviewed: 02/11/2016 Elsevier Patient Education  2020 [place].  Bleeding Precautions When on Anticoagulant Therapy, Adult Anticoagulant therapy, also called blood thinner therapy, is medicine that helps to prevent and treat blood clots. The medicine works by stopping blood clots from forming or growing. Blood clots that form in your blood vessels can be dangerous. They can break loose and travel to the heart, lungs, or brain. This increases the risk of a heart attack, stroke, or blocked lung artery (pulmonary embolism). Anticoagulants also increase the risk of bleeding. Try to protect yourself from cuts and other injuries that can cause bleeding. It is important to take anticoagulants exactly as told by your health care provider. Why do I need to be on anticoagulant therapy? You may need this medicine if you are at risk of developing a blood clot. Conditions that increase your risk of a blood clot include:  Being born with heart disease or a heart malformation (congenital heart disease).  Developing heart disease.  Having had surgery, such as valve replacement.  Having had a serious accident or other type of severe injury (trauma).  Having certain types of cancer.  Having certain diseases that can increase blood clotting.  Having a high risk of stroke or heart attack.  Having atrial fibrillation (AF). What are the common anticoagulant medicines? There are several types of anticoagulant medicines. The most common types are:  Medicines that you take by mouth (oral medicines), such as: ?  Warfarin. ? Novel oral anticoagulants (NOACs), such as: ? Direct thrombin inhibitors (dabigatran). ? Factor Xa inhibitors (apixaban, edoxaban, and rivaroxaban).  Injections, such as: ? Unfractionated heparin. ? Low molecular weight heparin. These anticoagulants work in different ways to prevent blood clots. They also have different risks and side effects. What do I need to  remember while on anticoagulant therapy? Taking anticoagulants  Take your medicine at the same time every day. If you forget to take your medicine, take it as soon as you remember. Do not double your dosage of medicine if you miss a whole day. Take your normal dose and call your health care provider.  Do not stop taking your medicine unless your health care provider approves. Stopping the medicine can increase your risk of developing a blood clot. Taking other medicines  Take over-the-counter and prescriptions medicines only as told by your health care provider.  Do not take over-the-counter NSAIDs, including aspirin and ibuprofen, while you are on anticoagulant therapy. These medicines increase your risk of dangerous bleeding.  Get approval from your health care provider before you start taking any new medicines, vitamins, or herbal products. Some of these could interfere with your therapy. General instructions  Keep all follow-up visits as told by your health care provider. This is important.  If you are pregnant or trying to get pregnant, talk with a health care provider about anticoagulants. Some of these medicines are not safe to take during pregnancy.  Tell all health care providers, including your dentist, that you are on anticoagulant therapy. It is especially important to tell providers before you have any surgery, medical procedures, or dental work done. What precautions should I take?   Be very careful when using knives, scissors, or other sharp objects.  Use an electric razor instead of a blade.  Do  not use toothpicks.  Use a soft-bristled toothbrush. Brush your teeth gently.  Always wear shoes outdoors and wear slippers indoors.  Be careful when cutting your fingernails and toenails.  Place bath mats in the bathroom. If possible, install handrails as well.  Wear gloves while you do yard work.  Wear your seat belt.  Prevent falls by removing loose rugs and extension cords from areas where you walk. Use a cane or walker if you need it.  Avoid constipation by: ? Drinking enough fluid to keep your urine clear or pale yellow. ? Eating foods that are high in fiber, such as fresh fruits and vegetables, whole grains, and beans. ? Limiting foods that are high in fat and processed sugars, such as fried and sweet foods.  Do not play contact sports or participate in other activities that have a high risk for injury. What other precautions are important if on warfarin therapy? If you are taking a type of anticoagulant called warfarin, make sure you:  Work with a diet and nutrition specialist (dietitian) to make an eating plan. Do not make any sudden changes to your diet after you have started your eating plan.  Do not drink alcohol. It can interfere with your medicine and increase your risk of an injury that causes bleeding.  Get regular blood tests as told by your health care provider. What are some questions to ask my health care provider?  Why do I need anticoagulant therapy?  What is the best anticoagulant therapy for my condition?  How long will I need anticoagulant therapy?  What are the side effects of anticoagulant therapy?  When should I take my medicine? What should I do if I forget to take it?  Will I need to have regular blood tests?  Do I need to change my diet? Are there foods or drinks that I should avoid?  What activities are safe for me?  What should I do if I want to get pregnant? Contact a health care provider  if:  You miss a dose of medicine: ? And you  are not sure what to do. ? For more than one day.  You have: ? Menstrual bleeding that is heavier than normal. ? Bloody or brown urine. ? Easy bruising. ? Black and tarry stool or bright red stool. ? Side effects from your medicine.  You feel weak or dizzy.  You become pregnant. Get help right away if:  You have bleeding that will not stop within 20 minutes from: ? The nose. ? The gums. ? A cut on the skin.  You have a severe headache or stomachache.  You vomit or cough up blood.  You fall or hit your head. Summary  Anticoagulant therapy, also called blood thinner therapy, is medicine that helps to prevent and treat blood clots.  Anticoagulants work in different ways to prevent blood clots. They also have different risks and side effects.  Talk with your health care provider about any precautions that you should take while on anticoagulant therapy. This information is not intended to replace advice given to you by your health care provider. Make sure you discuss any questions you have with your health care provider. Document Released: 02/02/2015 Document Revised: 06/13/2018 Document Reviewed: 05/10/2016 Elsevier Patient Education  [place], Adult The ears produce a substance called earwax that helps keep bacteria out of the ear and protects the skin in the ear canal. Occasionally, earwax can build up in the ear and cause discomfort or hearing loss. What increases the risk? This condition is more likely to develop in people who:  Are female.  Are elderly.  Naturally produce more earwax.  Clean their ears often with cotton swabs.  Use earplugs often.  Use in-ear headphones often.  Wear hearing aids.  Have narrow ear canals.  Have earwax that is overly thick or sticky.  Have eczema.  Are dehydrated.  Have excess hair in the ear canal. What are the signs or symptoms? Symptoms of this condition include:  Reduced or muffled  hearing.  A feeling of fullness in the ear or feeling that the ear is plugged.  Fluid coming from the ear.  Ear pain.  Ear itch.  Ringing in the ear.  Coughing.  An obvious piece of earwax that can be seen inside the ear canal. How is this diagnosed? This condition may be diagnosed based on:  Your symptoms.  Your medical history.  An ear exam. During the exam, your health care provider will look into your ear with an instrument called an otoscope. You may have tests, including a hearing test. How is this treated? This condition may be treated by:  Using ear drops to soften the earwax.  Having the earwax removed by a health care provider. The health care provider may: ? Flush the ear with water. ? Use an instrument that has a loop on the end (curette). ? Use a suction device.  Surgery to remove the wax buildup. This may be done in severe cases. Follow these instructions at home:   Take over-the-counter and prescription medicines only as told by your health care provider.  Do not put any objects, including cotton swabs, into your ear. You can clean the opening of your ear canal with a washcloth or facial tissue.  Follow instructions from your health care provider about cleaning your ears. Do not over-clean your ears.  Drink enough fluid to keep your urine clear or pale yellow. This will help to thin the earwax.  Keep all follow-up visits as told by your health care provider. If earwax builds up in your ears often or if you use hearing aids, consider seeing your health care provider for routine, preventive ear cleanings. Ask your health care provider how often you should schedule your cleanings.  If you have hearing aids, clean them according to instructions from the manufacturer and your health care provider. Contact a health care provider if:  You have ear pain.  You develop a fever.  You have blood, pus, or other fluid coming from your ear.  You have hearing  loss.  You have ringing in your ears that does not go away.  Your symptoms do not improve with treatment.  You feel like the room is spinning (vertigo). Summary  Earwax can build up in the ear and cause discomfort or hearing loss.  The most common symptoms of this condition include reduced or muffled hearing and a feeling of fullness in the ear or feeling that the ear is plugged.  This condition may be diagnosed based on your symptoms, your medical history, and an ear exam.  This condition may be treated by using ear drops to soften the earwax or by having the earwax removed by a health care provider.  Do not put any objects, including cotton swabs, into your ear. You can clean the opening of your ear canal with a washcloth or facial tissue. This information is not intended to replace advice given to you by your health care provider. Make sure you discuss any questions you have with your health care provider. Document Released: 03/31/2004 Document Revised: 02/03/2017 Document Reviewed: 05/04/2016 Elsevier Patient Education  2020 [employer].

## 2018-09-05 NOTE — Progress Notes (Signed)
Subjective:   Amanda Wiggins is a 73 y.o. female who presents for Medicare Annual (Subsequent) preventive examination.  Pt is followed by Cardiology for CAD, s/p PCI/DES 05/2015.  H/o bicuspid aortic valve stenosis s/p aortic valve repair on coumadin.  Notes easy bruising 2/2 the medication.  Pt has a h/o CHF.  Denies LE edema, unless eat something salty.  No longer taking diuretic.  Pt followed by Oncology for h/o breast cancer s/p lobectomy and radiation.  Had appointment in June.  Received infusion for osteoporosis.  Now has follow-up yearly instead of every 6 months.  Had recent labs, CMP, CBC.  Glucose was elevated at time of blood draw.  History of migraines.  No longer able to take NSAIDs 2/2 use of Coumadin.  Will take Tylenol arthritis strength, but it does not help much.  Patient endorses having less migraines-had one last night.  Drinks mostly water daily.  Had yearly vision screens in December.  At last visit told had beginning stages of cataracts.  Mood is ok.  Energy decreased s/p MI x 2 in 2017.  Sleep is okay, taking sleeping pill nightly, may get 5 hours per night.  Patient is used to staying active by walking and going to the gym however she has been doing this less 2/2 COVID-19 pandemic.    Patient notes improvement in balance since last OFV after going to PT.  Was also doing yoga prior to COVID-19 pandemic.  Denies difficulty with hearing.  Has a history of recurrent eustachian tube dysfunction.  Plans to see Dr. Constance Holster, ENT  Colonoscopy 2018 after positive cologuard.  Patient endorses recent fall.  States she tripped over a cord while blowing up an air mattress.  Patient states she caught herself from falling but injured her left hand/thumb in the process.  Patient was seen by hand specialist.  States was given an injection of cortisone.  Has a history of arthritis in bilateral hands.  Review of Systems:  General: Denies fever, chills, night sweats, changes in weight,  changes in appetite  +h/o migraines HEENT: Denies headaches, ear pain, changes in vision, rhinorrhea, sore throat  +R ear fullness CV: Denies CP, palpitations, SOB, orthopnea Pulm: Denies SOB, cough, wheezing GI: Denies abdominal pain, nausea, vomiting, diarrhea, constipation GU: Denies dysuria, hematuria, frequency, vaginal discharge Msk: Denies muscle cramps, joint pains Neuro: Denies weakness, numbness, tingling Skin: Denies rashes, bruising Psych: Denies depression, anxiety, hallucinations       Objective:     Vitals: BP 130/90 (BP Location: Left Arm, Patient Position: Sitting, Cuff Size: Normal)   Pulse 62   Temp 98.2 F (36.8 C) (Oral)   Ht 5' 1.5" (1.562 m)   Wt 142 lb (64.4 kg)   SpO2 98%   BMI 26.40 kg/m   Body mass index is 26.4 kg/m.   Gen. Pleasant, well developed, well-nourished, in NAD HEENT - [place]/AT, PERRL, EOMI, conjunctive clear, no scleral icterus, no nasal drainage, pharynx without erythema or exudate.  L canal and TM normal.  Right canal occluded with cerumen.  Right TM normal after irrigation. Lungs: no use of accessory muscles, no dullness to percussion, CTAB, no wheezes, rales or rhonchi Cardiovascular: RRR, 2/6 murmur best heard RUSB, no peripheral edema Abdomen: BS present, soft, nontender,nondistended. Musculoskeletal: No deformities, moves all four extremities, no cyanosis or clubbing, normal tone Neuro:  A&Ox3, CN II-XII intact, normal gait Skin:  Warm, dry, intact.  Small erythematous papule on R lower abdomen, no drainage, induration, or edema noted.  Advanced Directives 08/13/2018 02/12/2018 01/22/2018 08/14/2017 08/15/2016 08/13/2015 06/17/2015  Does Patient Have a Medical Advance Directive? Yes Yes Yes Yes Yes Yes Yes  Type of Paramedic of [place];Living will [place];Living will [place];Living will [place];Living will [place];Living will  [place];Living will Living will;Healthcare Power of Attorney  Does patient want to make changes to medical advance directive? No - Patient declined No - Patient declined - - - - No - Patient declined  Copy of [place] in Chart? No - copy requested No - copy requested - - - - No - copy requested  Would patient like information on creating a medical advance directive? - - - - - - -    Tobacco Social History   Tobacco Use  Smoking Status Never Smoker  Smokeless Tobacco Never Used     Counseling given: Not Answered   Past Medical History:  Diagnosis Date  . Aortic valve replaced   . Breast cancer ([place]) 2005  . FHx: migraine headaches   . GERD (gastroesophageal reflux disease)   . Hiatal hernia   . Hx Breast Cancer, IDC, Stage I 07/03/2003  . Hx of breast cancer   . Hyperlipidemia   . Hypertension   . Menopausal syndrome   . Osteopenia   . 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 Personal history of radiation therapy   . Status post aortic valve repair    Past Surgical History:  Procedure Laterality Date  . AORTIC VALVE REPLACEMENT  2003  . BREAST BIOPSY    . BREAST LUMPECTOMY Left 2005  . CARDIAC CATHETERIZATION     Ejection Fraction   . CARDIAC CATHETERIZATION N/A 05/16/2015   Procedure: Left Heart Cath and Coronary Angiography;  Surgeon: Leonie Man, MD;  Location: [place] [facility];  Service: Cardiovascular;  Laterality: N/A;  . CARDIAC CATHETERIZATION  05/16/2015   Procedure: Coronary Stent Intervention;  Surgeon: Leonie Man, MD;  Location: [place] [facility];  Service: Cardiovascular;;  . CARDIAC CATHETERIZATION N/A 05/19/2015   Procedure: Left Heart Cath and Coronary Angiography;  Surgeon: Troy Sine, MD;  Location: [place] [facility];  Service: Cardiovascular;  Laterality: N/A;  . CARDIAC CATHETERIZATION N/A 05/19/2015   Procedure: Coronary Stent Intervention;  Surgeon: Troy Sine, MD;  Location: [place] [facility];  Service: Cardiovascular;  Laterality: N/A;  . HIATAL HERNIA REPAIR    . HIATAL HERNIA REPAIR  05/11/2007  . hysterectomy, endometiral cancer  2001  . lummpectomy breast cancer  07/03/2003  . nissan fundoplicaiton  [id number]   post-op hematoma on heparin   . right shouler surgery     Family History  Problem Relation Age of Onset  . Heart attack Mother 4  . Osteoarthritis Brother        hpercholesterolemia   Social History   Socioeconomic History  . Marital status: Married    Spouse name: Not on file  . Number of children: Not on file  . Years of education: Not on file  . Highest education level: Not on file  Occupational History  . Not on file  Social Needs  . Financial resource strain: Not on file  . Food insecurity    Worry: Not on file    Inability: Not on file  . Transportation needs    Medical: Not on file    Non-medical: Not on file  Tobacco Use  .  Smoking status: Never Smoker  . Smokeless tobacco: Never Used  Substance and Sexual Activity  . Alcohol use: Yes    Alcohol/week: 0.0 standard drinks    Comment: socially  . Drug use: No  . Sexual activity: Not on file  Lifestyle  . Physical activity    Days per week: Not on file    Minutes per session: Not on file  . Stress: Not on file  Relationships  . Social Herbalist on phone: Not on file    Gets together: Not on file    Attends religious service: Not on file    Active member of club or organization: Not on file    Attends meetings of clubs or organizations: Not on file    Relationship status: Not on file  Other Topics Concern  . Not on file  Social History Narrative  . Not on file    Outpatient Encounter Medications as of 09/05/2018  Medication Sig  . Alirocumab (PRALUENT) 75 MG/ML SOAJ Inject 1 pen into the skin every 14 (fourteen) days.  Marland Kitchen amoxicillin (AMOXIL) 500 MG capsule TAKE 4 CAPSULES BY MOUTH ONE HOUR PRIOR TO APPOINTMENT  . BIOTIN PO Take 1 tablet by mouth every  morning.   . Calcium Carbonate-Vitamin D (CALCIUM 600+D) 600-400 MG-UNIT per tablet Take 1 tablet by mouth 2 (two) times daily.    . carvedilol (COREG) 3.125 MG tablet TAKE 1 TABLET (3.125 MG TOTAL) BY MOUTH 2 (TWO) TIMES DAILY WITH A MEAL.  Marland Kitchen clopidogrel (PLAVIX) 75 MG tablet TAKE 1 TABLET (75 MG TOTAL) BY MOUTH DAILY WITH BREAKFAST.  . cycloSPORINE (RESTASIS) 0.05 % ophthalmic emulsion Place 1 drop into both eyes 2 (two) times daily as needed (DRY EYE).   . Desoximetasone 0.05 % GEL Apply 1 application topically as needed (AS NEEDED TO AFFECTED AREA).   . Glucosamine-Chondroit-Vit C-Mn (GLUCOSAMINE CHONDR 1500 COMPLX) CAPS Take 1 capsule by mouth every morning.   . loratadine (CLARITIN) 10 MG tablet Take 10 mg by mouth daily.    . Multiple Vitamin (MULTIVITAMIN) capsule Take 1 capsule by mouth daily.    . nitroGLYCERIN (NITROSTAT) 0.4 MG SL tablet Place 1 tablet (0.4 mg total) under the tongue every 5 (five) minutes as needed for chest pain.  . pantoprazole (PROTONIX) 40 MG tablet TAKE 1 TABLET BY MOUTH EVERY DAY  . PAZEO 0.7 % SOLN INSTILL 1 DROP INTO BOTH EYES DAILY AS NEEDED FOR ALLERGIES  . temazepam (RESTORIL) 30 MG capsule Take 1 capsule (30 mg total) by mouth at bedtime as needed for sleep.  Marland Kitchen warfarin (COUMADIN) 5 MG tablet TAKE AS DIRECTED BY COUMADIN CLINIC   Facility-Administered Encounter Medications as of 09/05/2018  Medication  . 0.9 %  sodium chloride infusion    Activities of Daily Living No difficulties with driving, balancing check book, cooking,  Patient Care Team: Billie Ruddy, MD as PCP - General (Family Medicine) Nahser, Wonda Cheng, MD as PCP - Cardiology (Cardiology) Izora Gala, MD as Consulting Physician (Otolaryngology) Volanda Napoleon, MD as Consulting Physician (Oncology)    Assessment:   This is a routine wellness examination for K[place].  Exercise Activities and Dietary recommendations    Goals   None     Fall Risk Fall Risk  09/05/2018  07/17/2017 09/25/2015 08/13/2015 06/17/2015  Falls in the past year? 1 No No No No  Number falls in past yr: 0 - - - -  Injury with Fall? 1 - - - -  Risk for fall due to : History of fall(s) - (No Data) - (No Data)  Risk for fall due to: Comment - - No history of falls - No history  Follow up Falls evaluation completed - - - -   Is the patient's home free of loose throw rugs in walkways, pet beds, electrical cords, etc?   yes      Grab bars in the bathroom? no      Handrails on the stairs?   yes      Adequate lighting?   yes  Depression Screen PHQ 2/9 Scores 09/05/2018 07/17/2017 09/25/2015 06/17/2015  PHQ - 2 Score 0 0 0 1  PHQ- 9 Score - - 1 4  Exception Documentation - - (No Data) (No Data)     Cognitive Function  Pt A&Ox3.  No cognitive deficits noted.    Immunization History  Administered Date(s) Administered  . Influenza Split 12/13/2011, 01/16/2013  . Influenza Whole 01/23/2007  . Influenza, High Dose Seasonal PF 11/11/2013, 12/03/2014, 12/08/2016, 12/02/2017  . Influenza,inj,quad, With Preservative 12/05/2016  . Influenza-Unspecified 12/08/2016  . Pneumococcal Conjugate-13 07/07/2014  . Pneumococcal Polysaccharide-23 06/20/2013  . Td 02/25/2016  . Tdap 02/05/2016  . Zoster Recombinat (Shingrix) 07/20/2016    Qualifies for Shingles Vaccine? Completed in 2018.  Screening Tests Health Maintenance  Topic Date Due  . INFLUENZA VACCINE  10/06/2018  . Fecal DNA (Cologuard)  07/29/2019  . MAMMOGRAM  08/19/2020  . TETANUS/TDAP  02/24/2026  . DEXA SCAN  Completed  . Hepatitis C Screening  Completed  . PNA vac Low Risk Adult  Completed  Shingrix vaccine done   Cancer Screenings: Lung: Low Dose CT Chest recommended if Age 57-80 years, 30 pack-year currently smoking OR have quit w/in 15years. Patient does not qualify.  Pt never smoked. Breast:  Up to date on Mammogram? Yes   Up to date of Bone Density/Dexa? Yes Colorectal: done in 2017     Plan:     HTN -Continue  current meds -Continue to limit sodium intake  CHF -Continue current meds -Continue to limit sodium intake -Continue follow-up with cardiology  Long-term anticoagulation -Continue Coumadin for aortic valve replacement -Continue regular INR checks -Given bleeding precautions handout  HLD -Continue lifestyle modifications  History of breast cancer -Continue following with oncology -Continue regular cancer screenings  Impacted cerumen of right ear -Consent obtained, right ear irrigated, patient tolerated procedure well -Given handout  Elevated glucose -Per chart review glucose elevated at time of lab draw on 08/13/2018 -Obtain hemoglobin A1c -Discussed cutting down on the amount of sweets  Annual wellness visit  I have personally reviewed and noted the following in the patient's chart:   . Medical and social history . Use of alcohol, tobacco or illicit drugs  . Current medications and supplements . Functional ability and status . Nutritional status . Physical activity . Advanced directives . List of other physicians . Hospitalizations, surgeries, and ER visits in previous 12 months . Vitals . Screenings to include cognitive, depression, and falls . Referrals and appointments  In addition, I have reviewed and discussed with patient certain preventive protocols, quality metrics, and best practice recommendations. A written personalized care plan for preventive services as well as general preventive health recommendations were provided to patient.     Billie Ruddy, MD  09/05/2018

## 2018-09-06 ENCOUNTER — Telehealth: Payer: Self-pay

## 2018-09-06 NOTE — Telephone Encounter (Signed)

## 2018-09-13 ENCOUNTER — Ambulatory Visit (INDEPENDENT_AMBULATORY_CARE_PROVIDER_SITE_OTHER): Payer: Medicare Other | Admitting: *Deleted

## 2018-09-13 ENCOUNTER — Other Ambulatory Visit: Payer: Self-pay

## 2018-09-13 DIAGNOSIS — Z952 Presence of prosthetic heart valve: Secondary | ICD-10-CM | POA: Diagnosis not present

## 2018-09-13 DIAGNOSIS — Z5181 Encounter for therapeutic drug level monitoring: Secondary | ICD-10-CM

## 2018-09-13 LAB — POCT INR: INR: 2.5 (ref 2.0–3.0)

## 2018-09-13 NOTE — Patient Instructions (Signed)
Description    Continue taking 1/2 tablet daily except 1 tablet on Tuesdays, Thursdays, and Saturdays. Recheck in 6 weeks. [facility] [phone]

## 2018-10-09 ENCOUNTER — Ambulatory Visit: Payer: Medicare Other | Attending: Orthopedic Surgery | Admitting: Physical Therapy

## 2018-10-09 ENCOUNTER — Encounter: Payer: Self-pay | Admitting: Physical Therapy

## 2018-10-09 ENCOUNTER — Other Ambulatory Visit: Payer: Self-pay

## 2018-10-09 DIAGNOSIS — R2681 Unsteadiness on feet: Secondary | ICD-10-CM | POA: Diagnosis present

## 2018-10-09 DIAGNOSIS — M25572 Pain in left ankle and joints of left foot: Secondary | ICD-10-CM | POA: Insufficient documentation

## 2018-10-09 DIAGNOSIS — M6281 Muscle weakness (generalized): Secondary | ICD-10-CM | POA: Diagnosis present

## 2018-10-09 DIAGNOSIS — M25672 Stiffness of left ankle, not elsewhere classified: Secondary | ICD-10-CM | POA: Insufficient documentation

## 2018-10-09 NOTE — Therapy (Signed)
[facility] [place], [place], [address] Phone: [phone]   Fax:  [phone]  Physical Therapy Evaluation  Patient Details  Name: Amanda Wiggins MRN: [id number] Date of Birth: November 27, 1945 Referring Provider (PT): Marchia Bond, MD   Encounter Date: 10/09/2018  PT End of Session - 10/09/18 1510    Visit Number  1    Number of Visits  12    Date for PT Re-Evaluation  11/27/18    Authorization Type  FOTO; Progress note every 10th visit;    PT Start Time  0946    PT Stop Time  1030    PT Time Calculation (min)  44 min    Equipment Utilized During Treatment  --   left ASO   Activity Tolerance  Patient tolerated treatment well    Behavior During Therapy  [facility] for tasks assessed/performed       Past Medical History:  Diagnosis Date  . Aortic valve replaced   . Breast cancer ([place]) 2005  . FHx: migraine headaches   . GERD (gastroesophageal reflux disease)   . Hiatal hernia   . Hx Breast Cancer, IDC, Stage I 07/03/2003  . Hx of breast cancer   . Hyperlipidemia   . Hypertension   . Menopausal syndrome   . Osteopenia   . 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 Personal history of radiation therapy   . Status post aortic valve repair     Past Surgical History:  Procedure Laterality Date  . AORTIC VALVE REPLACEMENT  2003  . BREAST BIOPSY    . BREAST LUMPECTOMY Left 2005  . CARDIAC CATHETERIZATION     Ejection Fraction   . CARDIAC CATHETERIZATION N/A 05/16/2015   Procedure: Left Heart Cath and Coronary Angiography;  Surgeon: Leonie Man, MD;  Location: [place] [facility];  Service: Cardiovascular;  Laterality: N/A;  . CARDIAC CATHETERIZATION  05/16/2015   Procedure: Coronary Stent Intervention;  Surgeon: Leonie Man, MD;  Location: [place] [facility];  Service: Cardiovascular;;  . CARDIAC CATHETERIZATION N/A 05/19/2015   Procedure: Left Heart Cath and Coronary Angiography;  Surgeon: Troy Sine,  MD;  Location: [place] [facility];  Service: Cardiovascular;  Laterality: N/A;  . CARDIAC CATHETERIZATION N/A 05/19/2015   Procedure: Coronary Stent Intervention;  Surgeon: Troy Sine, MD;  Location: [place] [facility];  Service: Cardiovascular;  Laterality: N/A;  . HIATAL HERNIA REPAIR    . HIATAL HERNIA REPAIR  05/11/2007  . hysterectomy, endometiral cancer  2001  . lummpectomy breast cancer  07/03/2003  . nissan fundoplicaiton  4709   post-op hematoma on heparin   . right shouler surgery      There were no vitals filed for this visit.   Subjective Assessment - 10/09/18 1309    Subjective  COVID-19 screening performed upon arrival.Patient arrives to physical therapy with reports of decreased left ankle ROM and increased left ankle edema secondary to a left distal fibular avulsion fracture on September 18, 2018. Patient stated she went to open her gate and she twisted on her ankle and fell. Patient report independence with ADLs but requires increased time to perform activities. 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 Patient is independent with donning and doffing off ASO. Patient reports minimal pain just discomfort in lateral ankle especially at night when she removes her ASO to sleep. 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    Limitations  Standing;Walking;House hold activities    Diagnostic tests  x-ray: routine healing    Patient Stated Goals  improve walking time    Currently in Pain?  Yes    Pain Score  --   minimal        [facility] PT Assessment - 10/09/18 0001      Assessment   Medical Diagnosis  Left anterior/distal fibula avulsion fracture, plantar fasciitis    Referring Provider (PT)  Marchia Bond, MD    Onset Date/Surgical Date  09/18/18    Next MD Visit  October 31, 2018    Prior Therapy  no       Precautions   Precautions  None    Required Braces or Orthoses  Other Brace/Splint    Other Brace/Splint  left ASO      Restrictions   Weight Bearing Restrictions  No      Balance Screen   Has the patient fallen in the past 6 months  Yes    How many times?  1    Has the patient had a decrease in activity level because of a fear of falling?   No    Is the patient reluctant to leave their home because of a fear of falling?   No      Home Film/video editor residence      Prior Function   Level of Independence  Independent with basic ADLs      Observation/Other Assessments   Focus on Therapeutic Outcomes (FOTO)   45% limited      Observation/Other Assessments-Edema    Edema  Figure 8      Figure 8 Edema   Figure 8 - Right   45.5 cm    Figure 8 - Left   44 cm      ROM / Strength   AROM / PROM / Strength  AROM;PROM      AROM   AROM Assessment Site  Ankle    Right/Left Ankle  Left    Left Ankle Dorsiflexion  0   knee bent 8 degrees   Left Ankle Plantar Flexion  25    Left Ankle Inversion  30    Left Ankle Eversion  10      PROM   PROM Assessment Site  Ankle    Right/Left Ankle  Left    Left Ankle Dorsiflexion  4   10 degrees knee bent   Left Ankle Plantar Flexion  28    Left Ankle Inversion  40    Left Ankle Eversion  18      Palpation   Palpation comment  Patient very tender to palpation to lateral ankle and inferior to lateral  and anterior to lateral malleolus.       Ambulation/Gait   Gait Pattern  Step-through pattern;Decreased stance time - left;Antalgic;Decreased dorsiflexion - left                Objective measurements completed on examination: See above findings.      [facility] Adult PT Treatment/Exercise - 10/09/18 0001      Modalities   Modalities  Vasopneumatic      Vasopneumatic   Number Minutes Vasopneumatic   10 minutes    Vasopnuematic Location   Ankle    Vasopneumatic Pressure  Low    Vasopneumatic  Temperature   34  PT Long Term Goals - 10/09/18 1421      PT LONG TERM GOAL #1   Title  Patient will be independent with HEP    Time  6    Period  Weeks    Status  New      PT LONG TERM GOAL #2   Title  Patient will demonstrate 4/5 left ankle MMT to improve stability during functional tasks.    Time  6    Period  Days    Status  New      PT LONG TERM GOAL #3   Title  Patient will demonstrate 8 degrees or greater of  left ankle DF AROM to improve gait mechanics.    Time  6    Period  Weeks    Status  New      PT LONG TERM GOAL #4   Title  Patient will demonstrate ability to walk greater than 15 minutes with left ankle pain less than or equal to 3/10 to return to walking program for exercise.    Time  6    Period  Weeks    Status  New             Plan - 10/09/18 1418    Clinical Impression Statement  Patient is a 74 year old female who presents to physical therapy with decreased left ankle AROM, decreased left ankle MMT, and increased edema secondary to left distal fibular avulsion fracture on September 18, 2018. Patient noted with a decreased stance time and decreased left ankle DF but with minimal antalgic gait pattern. Patient would benefit from skilled physical therapy to address deficits and address patient's goals.    Stability/Clinical Decision Making  Stable/Uncomplicated    Clinical Decision Making  Low    Rehab Potential  Excellent    PT Frequency  2x / week    PT Duration  6 weeks    PT Treatment/Interventions  ADLs/Self Care Home Management;Cryotherapy;Electrical Stimulation;Moist Heat;Ultrasound;Gait training;Stair training;Functional mobility training;Therapeutic activities;Therapeutic exercise;Balance training;Passive range of motion;Patient/family education;Neuromuscular re-education;Manual techniques;Vasopneumatic Device;Taping    PT Next Visit Plan  Create HEP. nustep or bike with low resistance, gentle AROM and PROM of left ankle,  modalities PRN for pain relief.    Consulted and Agree with Plan of Care  Patient       Patient will benefit from skilled therapeutic intervention in order to improve the following deficits and impairments:  Decreased activity tolerance, Decreased strength, Decreased range of motion, Difficulty walking, Pain  Visit Diagnosis: 1. Pain in left ankle and joints of left foot   2. Stiffness of left ankle, not elsewhere classified   3. Muscle weakness (generalized)        Problem List Patient Active Problem List   Diagnosis Date Noted  . Chronic systolic CHF (congestive heart failure) ([place]) 05/23/2015  . CAD S/P urgent PCI 05/16/15 05/19/2015  . Cardiomyopathy, ischemic 05/19/2015  . ST elevation (STEMI) myocardial infarction involving left anterior descending coronary artery ([place])   . Acute ST elevation myocardial infarction (STEMI) of anterolateral wall ([place]) 05/16/2015  . Encounter for therapeutic drug monitoring 07/19/2013  . 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 Aortic valve replaced 01/25/2011  . Long term current use of anticoagulant therapy 06/25/2010  . INFECTIOUS DIARRHEA 03/20/2008  . DIARRHEA-PRESUMED INFECTIOUS 03/20/2008  . URTICARIA 03/13/2008  . FATIGUE 09/12/2007  . DIARRHEA 08/29/2007  . ABDOMINAL PAIN, GENERALIZED, CHRONIC 08/29/2007  . Allergic rhinitis 05/09/2007  . HIATAL HERNIA 05/09/2007  .  ARTHRITIS 05/09/2007  . OSTEOPENIA 05/09/2007  . GERD 01/23/2007  . CANCER, ENDOMETRIUM 07/25/2006  . Dyslipidemia 07/25/2006  . COMMON MIGRAINE 07/25/2006  . Essential hypertension 07/25/2006  . Hx Breast Cancer, IDC, Stage I 07/03/2003   Gabriela Eves, PT, DPT 10/09/2018, 5:12 PM  [facility] [address] [place], [place], [address] Phone: [phone]   Fax:  [phone]  Name: ARTA STUMP MRN: [id number] Date of Birth: October 22, 1945

## 2018-10-12 ENCOUNTER — Ambulatory Visit: Payer: Medicare Other | Admitting: *Deleted

## 2018-10-12 ENCOUNTER — Other Ambulatory Visit: Payer: Self-pay

## 2018-10-12 DIAGNOSIS — M25572 Pain in left ankle and joints of left foot: Secondary | ICD-10-CM | POA: Diagnosis not present

## 2018-10-12 DIAGNOSIS — M25672 Stiffness of left ankle, not elsewhere classified: Secondary | ICD-10-CM

## 2018-10-12 DIAGNOSIS — M6281 Muscle weakness (generalized): Secondary | ICD-10-CM

## 2018-10-12 DIAGNOSIS — R2681 Unsteadiness on feet: Secondary | ICD-10-CM

## 2018-10-12 NOTE — Therapy (Signed)
[facility] [place], [place], [address] Phone: [phone]   Fax:  [phone]  Physical Therapy Treatment  Patient Details  Name: Amanda Wiggins MRN: [id number] Date of Birth: [birth year] Referring Provider (PT): Marchia Bond, MD   Encounter Date: 10/12/2018  PT End of Session - 10/12/18 0819    Visit Number  2    Number of Visits  12    Date for PT Re-Evaluation  11/27/18    Authorization Type  FOTO; Progress note every 10th visit;    PT Start Time  0815    PT Stop Time  0903    PT Time Calculation (min)  48 min       Past Medical History:  Diagnosis Date  . Aortic valve replaced   . Breast cancer ([place]) 2005  . FHx: migraine headaches   . GERD (gastroesophageal reflux disease)   . Hiatal hernia   . Hx Breast Cancer, IDC, Stage I 07/03/2003  . Hx of breast cancer   . Hyperlipidemia   . Hypertension   . Menopausal syndrome   . Osteopenia   . 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 Personal history of radiation therapy   . Status post aortic valve repair     Past Surgical History:  Procedure Laterality Date  . AORTIC VALVE REPLACEMENT  2003  . BREAST BIOPSY    . BREAST LUMPECTOMY Left 2005  . CARDIAC CATHETERIZATION     Ejection Fraction   . CARDIAC CATHETERIZATION N/A 05/16/2015   Procedure: Left Heart Cath and Coronary Angiography;  Surgeon: Leonie Man, MD;  Location: [facility];  Service: Cardiovascular;  Laterality: N/A;  . CARDIAC CATHETERIZATION  05/16/2015   Procedure: Coronary Stent Intervention;  Surgeon: Leonie Man, MD;  Location: [facility];  Service: Cardiovascular;;  . CARDIAC CATHETERIZATION N/A 05/19/2015   Procedure: Left Heart Cath and Coronary Angiography;  Surgeon: Troy Sine, MD;  Location: [facility];  Service: Cardiovascular;  Laterality: N/A;  . CARDIAC CATHETERIZATION N/A 05/19/2015   Procedure: Coronary Stent Intervention;  Surgeon:  Troy Sine, MD;  Location: [facility];  Service: Cardiovascular;  Laterality: N/A;  . HIATAL HERNIA REPAIR    . HIATAL HERNIA REPAIR  05/11/2007  . hysterectomy, endometiral cancer  2001  . lummpectomy breast cancer  07/03/2003  . nissan fundoplicaiton  2426   post-op hematoma on heparin   . right shouler surgery      There were no vitals filed for this visit.  Subjective Assessment - 10/12/18 0841    Subjective  COVID19 screening performed prior to entering the building.Doing fair today. Do ok as long as I wear the brace.    Currently in Pain?  No/denies    Pain Score  0-No pain                       [facility] Adult PT Treatment/Exercise - 10/12/18 0001      Exercises   Exercises  Ankle;Knee/Hip      Knee/Hip Exercises: Aerobic   Nustep  L4 x 10 mins seat 7 AFO donned      Modalities   Modalities  Vasopneumatic      Vasopneumatic   Number Minutes Vasopneumatic   10 minutes    Vasopnuematic Location   Ankle    Vasopneumatic Pressure  Low    Vasopneumatic Temperature   34  Ankle Exercises: Seated   Other Seated Ankle Exercises  HEP DF/PF 3x10, CW/CCW 3x10 each    Other Seated Ankle Exercises  Rockerboard  DF/PF 3x10, EV/INV 3x10 seated             PT Education - 10/12/18 0846    Education Details  Ankle ROM    Person(s) Educated  Patient    Methods  Handout;Explanation;Demonstration;Tactile cues;Verbal cues    Comprehension  Verbalized understanding;Returned demonstration;Verbal cues required          PT Long Term Goals - 10/09/18 1421      PT LONG TERM GOAL #1   Title  Patient will be independent with HEP    Time  6    Period  Weeks    Status  New      PT LONG TERM GOAL #2   Title  Patient will demonstrate 4/5 left ankle MMT to improve stability during functional tasks.    Time  6    Period  Days    Status  New      PT LONG TERM GOAL #3   Title  Patient will demonstrate 8 degrees or greater of  left ankle DF AROM to  improve gait mechanics.    Time  6    Period  Weeks    Status  New      PT LONG TERM GOAL #4   Title  Patient will demonstrate ability to walk greater than 15 minutes with left ankle pain less than or equal to 3/10 to return to walking program for exercise.    Time  6    Period  Weeks    Status  New            Plan - 10/12/18 5809    Clinical Impression Statement  Pt arrived today doing fairly well and was able to perform AROM exs with circles being the most challenging. Sheet given for HEP. She also did well with seated Rockerboard with eversion/inversion being the most challenging. Normal Vaso response.    Stability/Clinical Decision Making  Stable/Uncomplicated    Rehab Potential  Excellent    PT Frequency  2x / week    PT Duration  6 weeks    PT Treatment/Interventions  ADLs/Self Care Home Management;Cryotherapy;Electrical Stimulation;Moist Heat;Ultrasound;Gait training;Stair training;Functional mobility training;Therapeutic activities;Therapeutic exercise;Balance training;Passive range of motion;Patient/family education;Neuromuscular re-education;Manual techniques;Vasopneumatic Device;Taping    PT Next Visit Plan  Create HEP. nustep or bike with low resistance, gentle AROM and PROM of left ankle, modalities PRN for pain relief.       Patient will benefit from skilled therapeutic intervention in order to improve the following deficits and impairments:  Decreased activity tolerance, Decreased strength, Decreased range of motion, Difficulty walking, Pain  Visit Diagnosis: 1. Pain in left ankle and joints of left foot   2. Stiffness of left ankle, not elsewhere classified   3. Muscle weakness (generalized)   4. Unsteadiness on feet        Problem List Patient Active Problem List   Diagnosis Date Noted  . Chronic systolic CHF (congestive heart failure) ([place]) 05/23/2015  . CAD S/P urgent PCI 05/16/15 05/19/2015  . Cardiomyopathy, ischemic 05/19/2015  . ST elevation  (STEMI) myocardial infarction involving left anterior descending coronary artery ([place])   . Acute ST elevation myocardial infarction (STEMI) of anterolateral wall ([place]) 05/16/2015  . Encounter for therapeutic drug monitoring 07/19/2013  . 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 Aortic valve replaced 01/25/2011  .  Long term current use of anticoagulant therapy 06/25/2010  . INFECTIOUS DIARRHEA 03/20/2008  . DIARRHEA-PRESUMED INFECTIOUS 03/20/2008  . URTICARIA 03/13/2008  . FATIGUE 09/12/2007  . DIARRHEA 08/29/2007  . ABDOMINAL PAIN, GENERALIZED, CHRONIC 08/29/2007  . Allergic rhinitis 05/09/2007  . HIATAL HERNIA 05/09/2007  . ARTHRITIS 05/09/2007  . OSTEOPENIA 05/09/2007  . GERD 01/23/2007  . CANCER, ENDOMETRIUM 07/25/2006  . Dyslipidemia 07/25/2006  . COMMON MIGRAINE 07/25/2006  . Essential hypertension 07/25/2006  . Hx Breast Cancer, IDC, Stage I 07/03/2003    RAMSEUR,CHRIS, PTA 10/12/2018, 9:12 AM  [facility] [place], [place], [address] Phone: [phone]   Fax:  [phone]  Name: LORIELLE BOEHNING MRN: [id number] Date of Birth: 12-24-1945

## 2018-10-16 ENCOUNTER — Ambulatory Visit: Payer: Medicare Other | Admitting: *Deleted

## 2018-10-16 ENCOUNTER — Other Ambulatory Visit: Payer: Self-pay

## 2018-10-16 DIAGNOSIS — R2681 Unsteadiness on feet: Secondary | ICD-10-CM

## 2018-10-16 DIAGNOSIS — M6281 Muscle weakness (generalized): Secondary | ICD-10-CM

## 2018-10-16 DIAGNOSIS — M25672 Stiffness of left ankle, not elsewhere classified: Secondary | ICD-10-CM

## 2018-10-16 DIAGNOSIS — M25572 Pain in left ankle and joints of left foot: Secondary | ICD-10-CM | POA: Diagnosis not present

## 2018-10-16 NOTE — Therapy (Signed)
[facility] [place], [place], [address] Phone: [phone]   Fax:  [phone]  Physical Therapy Treatment  Patient Details  Name: Amanda Wiggins MRN: [id number] Date of Birth: [birth year] Referring Provider (PT): Marchia Bond, MD   Encounter Date: 10/16/2018  PT End of Session - 10/16/18 0906    Visit Number  3    Number of Visits  12    Date for PT Re-Evaluation  11/27/18    Authorization Type  FOTO; Progress note every 10th visit;    PT Start Time  0900    PT Stop Time  0950    PT Time Calculation (min)  50 min       Past Medical History:  Diagnosis Date  . Aortic valve replaced   . Breast cancer ([place]) 2005  . FHx: migraine headaches   . GERD (gastroesophageal reflux disease)   . Hiatal hernia   . Hx Breast Cancer, IDC, Stage I 07/03/2003  . Hx of breast cancer   . Hyperlipidemia   . Hypertension   . Menopausal syndrome   . Osteopenia   . 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 Personal history of radiation therapy   . Status post aortic valve repair     Past Surgical History:  Procedure Laterality Date  . AORTIC VALVE REPLACEMENT  2003  . BREAST BIOPSY    . BREAST LUMPECTOMY Left 2005  . CARDIAC CATHETERIZATION     Ejection Fraction   . CARDIAC CATHETERIZATION N/A 05/16/2015   Procedure: Left Heart Cath and Coronary Angiography;  Surgeon: Leonie Man, MD;  Location: [facility];  Service: Cardiovascular;  Laterality: N/A;  . CARDIAC CATHETERIZATION  05/16/2015   Procedure: Coronary Stent Intervention;  Surgeon: Leonie Man, MD;  Location: [facility];  Service: Cardiovascular;;  . CARDIAC CATHETERIZATION N/A 05/19/2015   Procedure: Left Heart Cath and Coronary Angiography;  Surgeon: Troy Sine, MD;  Location: [facility];  Service: Cardiovascular;  Laterality: N/A;  . CARDIAC CATHETERIZATION N/A 05/19/2015   Procedure: Coronary Stent Intervention;  Surgeon:  Troy Sine, MD;  Location: [facility];  Service: Cardiovascular;  Laterality: N/A;  . HIATAL HERNIA REPAIR    . HIATAL HERNIA REPAIR  05/11/2007  . hysterectomy, endometiral cancer  2001  . lummpectomy breast cancer  07/03/2003  . nissan fundoplicaiton  6803   post-op hematoma on heparin   . right shouler surgery      There were no vitals filed for this visit.  Subjective Assessment - 10/16/18 0904    Subjective  COVID19 screening performed prior to entering the building.Doing fair today. Do ok as long as I wear the brace. no pain, just soreness now.    Limitations  Standing;Walking;House hold activities    Diagnostic tests  x-ray: routine healing    Patient Stated Goals  improve walking time    Currently in Pain?  No/denies                       [facility] Adult PT Treatment/Exercise - 10/16/18 0001      Exercises   Exercises  Ankle;Knee/Hip      Knee/Hip Exercises: Aerobic   Nustep  L4 x 12 mins seat 7 AFO donned      Modalities   Modalities  Vasopneumatic      Vasopneumatic   Number Minutes Vasopneumatic   10 minutes    Vasopnuematic  Location   Ankle    Vasopneumatic Pressure  Low    Vasopneumatic Temperature   34      Ankle Exercises: Seated   Other Seated Ankle Exercises  Dyna disc CCW/ CW x 4 mins    Other Seated Ankle Exercises  Rockerboard  DF/PF x 3 mins, EV/INV x 3 mins seated      Ankle Exercises: Supine   T-Band  seated yellow band   PF 4x10  addded to HEP band given                  PT Long Term Goals - 10/09/18 1421      PT LONG TERM GOAL #1   Title  Patient will be independent with HEP    Time  6    Period  Weeks    Status  New      PT LONG TERM GOAL #2   Title  Patient will demonstrate 4/5 left ankle MMT to improve stability during functional tasks.    Time  6    Period  Days    Status  New      PT LONG TERM GOAL #3   Title  Patient will demonstrate 8 degrees or greater of  left ankle DF AROM to improve gait  mechanics.    Time  6    Period  Weeks    Status  New      PT LONG TERM GOAL #4   Title  Patient will demonstrate ability to walk greater than 15 minutes with left ankle pain less than or equal to 3/10 to return to walking program for exercise.    Time  6    Period  Weeks    Status  New            Plan - 10/16/18 0920    Clinical Impression Statement  Pt arrived today doing fairly well reporting just soreness in LT ankle. She was able to complete AROM exs and added PF strengthening with tband without complaints. Yellow tband given for HEP. Normal Vaso response today    Consulted and Agree with Plan of Care  Patient       Patient will benefit from skilled therapeutic intervention in order to improve the following deficits and impairments:  Decreased activity tolerance, Decreased strength, Decreased range of motion, Difficulty walking, Pain  Visit Diagnosis: 1. Pain in left ankle and joints of left foot   2. Stiffness of left ankle, not elsewhere classified   3. Muscle weakness (generalized)   4. Unsteadiness on feet        Problem List Patient Active Problem List   Diagnosis Date Noted  . Chronic systolic CHF (congestive heart failure) ([place]) 05/23/2015  . CAD S/P urgent PCI 05/16/15 05/19/2015  . Cardiomyopathy, ischemic 05/19/2015  . ST elevation (STEMI) myocardial infarction involving left anterior descending coronary artery ([place])   . Acute ST elevation myocardial infarction (STEMI) of anterolateral wall ([place]) 05/16/2015  . Encounter for therapeutic drug monitoring 07/19/2013  . 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 Aortic valve replaced 01/25/2011  . Long term current use of anticoagulant therapy 06/25/2010  . INFECTIOUS DIARRHEA 03/20/2008  . DIARRHEA-PRESUMED INFECTIOUS 03/20/2008  . URTICARIA 03/13/2008  . FATIGUE 09/12/2007  . DIARRHEA 08/29/2007  . ABDOMINAL PAIN, GENERALIZED, CHRONIC 08/29/2007  . Allergic rhinitis 05/09/2007  . HIATAL  HERNIA 05/09/2007  . ARTHRITIS 05/09/2007  . OSTEOPENIA 05/09/2007  . GERD 01/23/2007  . CANCER, ENDOMETRIUM 07/25/2006  .  Dyslipidemia 07/25/2006  . COMMON MIGRAINE 07/25/2006  . Essential hypertension 07/25/2006  . Hx Breast Cancer, IDC, Stage I 07/03/2003    Sharni Negron,CHRIS, PTA 10/16/2018, 9:53 AM  [facility] [address] [place], [place], [address] Phone: [phone]   Fax:  [phone]  Name: Amanda Wiggins MRN: [id number] Date of Birth: 02-Mar-1946

## 2018-10-18 ENCOUNTER — Other Ambulatory Visit: Payer: Self-pay | Admitting: Cardiovascular Disease

## 2018-10-19 ENCOUNTER — Ambulatory Visit: Payer: Medicare Other | Admitting: *Deleted

## 2018-10-19 ENCOUNTER — Other Ambulatory Visit: Payer: Self-pay

## 2018-10-19 DIAGNOSIS — M25572 Pain in left ankle and joints of left foot: Secondary | ICD-10-CM | POA: Diagnosis not present

## 2018-10-19 DIAGNOSIS — R2681 Unsteadiness on feet: Secondary | ICD-10-CM

## 2018-10-19 DIAGNOSIS — M6281 Muscle weakness (generalized): Secondary | ICD-10-CM

## 2018-10-19 DIAGNOSIS — M25672 Stiffness of left ankle, not elsewhere classified: Secondary | ICD-10-CM

## 2018-10-19 NOTE — Therapy (Signed)
[facility] [place], [place], [address] Phone: [phone]   Fax:  [phone]  Physical Therapy Treatment  Patient Details  Name: Amanda Wiggins MRN: [id number] Date of Birth: 08/18/1945 Referring Provider (PT): Marchia Bond, MD   Encounter Date: 10/19/2018  PT End of Session - 10/19/18 0908    Visit Number  4    Number of Visits  12    Date for PT Re-Evaluation  11/27/18    Authorization Type  FOTO; Progress note every 10th visit;    PT Start Time  0900    PT Stop Time  0951    PT Time Calculation (min)  51 min    Activity Tolerance  Patient tolerated treatment well    Behavior During Therapy  [facility] for tasks assessed/performed       Past Medical History:  Diagnosis Date  . Aortic valve replaced   . Breast cancer ([place]) 2005  . FHx: migraine headaches   . GERD (gastroesophageal reflux disease)   . Hiatal hernia   . Hx Breast Cancer, IDC, Stage I 07/03/2003  . Hx of breast cancer   . Hyperlipidemia   . Hypertension   . Menopausal syndrome   . Osteopenia   . 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 Personal history of radiation therapy   . Status post aortic valve repair     Past Surgical History:  Procedure Laterality Date  . AORTIC VALVE REPLACEMENT  2003  . BREAST BIOPSY    . BREAST LUMPECTOMY Left 2005  . CARDIAC CATHETERIZATION     Ejection Fraction   . CARDIAC CATHETERIZATION N/A 05/16/2015   Procedure: Left Heart Cath and Coronary Angiography;  Surgeon: Leonie Man, MD;  Location: [facility];  Service: Cardiovascular;  Laterality: N/A;  . CARDIAC CATHETERIZATION  05/16/2015   Procedure: Coronary Stent Intervention;  Surgeon: Leonie Man, MD;  Location: [facility];  Service: Cardiovascular;;  . CARDIAC CATHETERIZATION N/A 05/19/2015   Procedure: Left Heart Cath and Coronary Angiography;  Surgeon: Troy Sine, MD;  Location: [facility];  Service:  Cardiovascular;  Laterality: N/A;  . CARDIAC CATHETERIZATION N/A 05/19/2015   Procedure: Coronary Stent Intervention;  Surgeon: Troy Sine, MD;  Location: [facility] [facility];  Service: Cardiovascular;  Laterality: N/A;  . HIATAL HERNIA REPAIR    . HIATAL HERNIA REPAIR  05/11/2007  . hysterectomy, endometiral cancer  2001  . lummpectomy breast cancer  07/03/2003  . nissan fundoplicaiton  3007   post-op hematoma on heparin   . right shouler surgery      There were no vitals filed for this visit.  Subjective Assessment - 10/19/18 0907    Subjective  COVID19 screening performed prior to entering the building.Doing fair today. No pain just soreness and mild swelling. Doing better    Limitations  Standing;Walking;House hold activities    Diagnostic tests  x-ray: routine healing    Patient Stated Goals  improve walking time    Currently in Pain?  No/denies         [facility] PT Assessment - 10/19/18 0001      AROM   AROM Assessment Site  Ankle    Right/Left Ankle  Left    Left Ankle Dorsiflexion  10    Left Ankle Plantar Flexion  40                   [facility] Adult PT Treatment/Exercise -  10/19/18 0001      Exercises   Exercises  Ankle;Knee/Hip      Knee/Hip Exercises: Aerobic   Nustep  L4 x 12 mins seat 7 AFO donned      Modalities   Modalities  Vasopneumatic      Vasopneumatic   Number Minutes Vasopneumatic   10 minutes    Vasopnuematic Location   Ankle    Vasopneumatic Pressure  Low    Vasopneumatic Temperature   34      Ankle Exercises: Standing   Rocker Board  4 minutes   DF/PF calf stretch   Heel Raises  10 reps;20 reps    Other Standing Ankle Exercises  LT ankle dyna disc x 4 mins all motions      Ankle Exercises: Supine   T-Band  seated yellow band    eversion 3x 10                   PT Long Term Goals - 10/09/18 1421      PT LONG TERM GOAL #1   Title  Patient will be independent with HEP    Time  6    Period  Weeks    Status  New       PT LONG TERM GOAL #2   Title  Patient will demonstrate 4/5 left ankle MMT to improve stability during functional tasks.    Time  6    Period  Days    Status  New      PT LONG TERM GOAL #3   Title  Patient will demonstrate 8 degrees or greater of  left ankle DF AROM to improve gait mechanics.    Time  6    Period  Weeks    Status  New      PT LONG TERM GOAL #4   Title  Patient will demonstrate ability to walk greater than 15 minutes with left ankle pain less than or equal to 3/10 to return to walking program for exercise.    Time  6    Period  Weeks    Status  New            Plan - 10/19/18 1050    Clinical Impression Statement  Pt arrived today doing well without pain and just soreness and mild swelling. She was able to progress to standing exs today, but 50% WB or less. Her ROM has improved to 10 degrees DF and to 40 degrees PF. Normal modality response today.    Stability/Clinical Decision Making  Stable/Uncomplicated    Rehab Potential  Excellent    PT Frequency  2x / week    PT Duration  6 weeks    PT Treatment/Interventions  ADLs/Self Care Home Management;Cryotherapy;Electrical Stimulation;Moist Heat;Ultrasound;Gait training;Stair training;Functional mobility training;Therapeutic activities;Therapeutic exercise;Balance training;Passive range of motion;Patient/family education;Neuromuscular re-education;Manual techniques;Vasopneumatic Device;Taping    PT Next Visit Plan  Create HEP. nustep or bike with low resistance, gentle AROM and PROM of left ankle, modalities PRN for pain relief.    Consulted and Agree with Plan of Care  Patient       Patient will benefit from skilled therapeutic intervention in order to improve the following deficits and impairments:  Decreased activity tolerance, Decreased strength, Decreased range of motion, Difficulty walking, Pain  Visit Diagnosis: 1. Pain in left ankle and joints of left foot   2. Stiffness of left ankle, not elsewhere  classified   3. Muscle weakness (generalized)   4. Unsteadiness on feet  Problem List Patient Active Problem List   Diagnosis Date Noted  . Chronic systolic CHF (congestive heart failure) ([place]) 05/23/2015  . CAD S/P urgent PCI 05/16/15 05/19/2015  . Cardiomyopathy, ischemic 05/19/2015  . ST elevation (STEMI) myocardial infarction involving left anterior descending coronary artery ([place])   . Acute ST elevation myocardial infarction (STEMI) of anterolateral wall ([place]) 05/16/2015  . Encounter for therapeutic drug monitoring 07/19/2013  . 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 Aortic valve replaced 01/25/2011  . Long term current use of anticoagulant therapy 06/25/2010  . INFECTIOUS DIARRHEA 03/20/2008  . DIARRHEA-PRESUMED INFECTIOUS 03/20/2008  . URTICARIA 03/13/2008  . FATIGUE 09/12/2007  . DIARRHEA 08/29/2007  . ABDOMINAL PAIN, GENERALIZED, CHRONIC 08/29/2007  . Allergic rhinitis 05/09/2007  . HIATAL HERNIA 05/09/2007  . ARTHRITIS 05/09/2007  . OSTEOPENIA 05/09/2007  . GERD 01/23/2007  . CANCER, ENDOMETRIUM 07/25/2006  . Dyslipidemia 07/25/2006  . COMMON MIGRAINE 07/25/2006  . Essential hypertension 07/25/2006  . Hx Breast Cancer, IDC, Stage I 07/03/2003    Amanda Wiggins,Amanda Wiggins, PTA 10/19/2018, 11:10 AM  [facility] [place], [place], [address] Phone: [phone]   Fax:  [phone]  Name: Amanda Wiggins MRN: [id number] Date of Birth: 1945/11/08

## 2018-10-21 ENCOUNTER — Encounter (HOSPITAL_COMMUNITY): Payer: Self-pay

## 2018-10-21 ENCOUNTER — Other Ambulatory Visit: Payer: Self-pay

## 2018-10-21 ENCOUNTER — Ambulatory Visit (HOSPITAL_COMMUNITY)
Admission: EM | Admit: 2018-10-21 | Discharge: 2018-10-21 | Disposition: A | Discharge status: Home / Self Care | Payer: Medicare Other | Attending: Emergency Medicine | Admitting: Emergency Medicine

## 2018-10-21 DIAGNOSIS — H9221 Otorrhagia, right ear: Secondary | ICD-10-CM | POA: Diagnosis not present

## 2018-10-21 DIAGNOSIS — H6121 Impacted cerumen, right ear: Secondary | ICD-10-CM | POA: Diagnosis not present

## 2018-10-21 NOTE — ED Provider Notes (Signed)
[place]    CSN: [id number] Arrival date & time: 10/21/18  1021     History   Chief Complaint Chief Complaint  Patient presents with  . Right Ear Bleeding    HPI Amanda Wiggins is a 73 y.o. female.   Patient presents with bleeding from her right ear yesterday evening.  She states it started spontaneously while she was eating dinner and resolved during the night with a cotton ball in her ear.  She would like her ears checked.  She is on Coumadin and Plavix.  She denies falls or injury.  She denies hearing loss or ear pain.  She denies fever, chills, otalgia, sore throat, congestion, rhinorrhea, cough, shortness of breath, or other symptoms.  The history is provided by the patient.    Past Medical History:  Diagnosis Date  . Aortic valve replaced   . Breast cancer ([place]) 2005  . FHx: migraine headaches   . GERD (gastroesophageal reflux disease)   . Hiatal hernia   . Hx Breast Cancer, IDC, Stage I 07/03/2003  . Hx of breast cancer   . Hyperlipidemia   . Hypertension   . Menopausal syndrome   . Osteopenia   . 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 Personal history of radiation therapy   . Status post aortic valve repair     Patient Active Problem List   Diagnosis Date Noted  . Chronic systolic CHF (congestive heart failure) ([place]) 05/23/2015  . CAD S/P urgent PCI 05/16/15 05/19/2015  . Cardiomyopathy, ischemic 05/19/2015  . ST elevation (STEMI) myocardial infarction involving left anterior descending coronary artery ([place])   . Acute ST elevation myocardial infarction (STEMI) of anterolateral wall ([place]) 05/16/2015  . Encounter for therapeutic drug monitoring 07/19/2013  . 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 Aortic valve replaced 01/25/2011  . Long term current use of anticoagulant therapy 06/25/2010  . INFECTIOUS DIARRHEA 03/20/2008  . DIARRHEA-PRESUMED INFECTIOUS 03/20/2008  . URTICARIA 03/13/2008  . FATIGUE 09/12/2007  .  DIARRHEA 08/29/2007  . ABDOMINAL PAIN, GENERALIZED, CHRONIC 08/29/2007  . Allergic rhinitis 05/09/2007  . HIATAL HERNIA 05/09/2007  . ARTHRITIS 05/09/2007  . OSTEOPENIA 05/09/2007  . GERD 01/23/2007  . CANCER, ENDOMETRIUM 07/25/2006  . Dyslipidemia 07/25/2006  . COMMON MIGRAINE 07/25/2006  . Essential hypertension 07/25/2006  . Hx Breast Cancer, IDC, Stage I 07/03/2003    Past Surgical History:  Procedure Laterality Date  . AORTIC VALVE REPLACEMENT  2003  . BREAST BIOPSY    . BREAST LUMPECTOMY Left 2005  . CARDIAC CATHETERIZATION     Ejection Fraction   . CARDIAC CATHETERIZATION N/A 05/16/2015   Procedure: Left Heart Cath and Coronary Angiography;  Surgeon: Leonie Man, MD;  Location: [facility];  Service: Cardiovascular;  Laterality: N/A;  . CARDIAC CATHETERIZATION  05/16/2015   Procedure: Coronary Stent Intervention;  Surgeon: Leonie Man, MD;  Location: [facility];  Service: Cardiovascular;;  . CARDIAC CATHETERIZATION N/A 05/19/2015   Procedure: Left Heart Cath and Coronary Angiography;  Surgeon: Troy Sine, MD;  Location: [facility];  Service: Cardiovascular;  Laterality: N/A;  . CARDIAC CATHETERIZATION N/A 05/19/2015   Procedure: Coronary Stent Intervention;  Surgeon: Troy Sine, MD;  Location: [facility];  Service: Cardiovascular;  Laterality: N/A;  . HIATAL HERNIA REPAIR    . HIATAL HERNIA REPAIR  05/11/2007  . hysterectomy, endometiral cancer  2001  . lummpectomy breast cancer  07/03/2003  . nissan  fundoplicaiton  4782   post-op hematoma on heparin   . right shouler surgery      OB History   No obstetric history on file.      Home Medications    Prior to Admission medications   Medication Sig Start Date End Date Taking? Authorizing Provider  Alirocumab (PRALUENT) 75 MG/ML SOAJ Inject 1 pen into the skin every 14 (fourteen) days. 04/24/18   Nahser, Wonda Cheng, MD  amoxicillin (AMOXIL) 500 MG capsule TAKE 4 CAPSULES BY  MOUTH ONE HOUR PRIOR TO APPOINTMENT 04/03/18   [provider]  BIOTIN PO Take 1 tablet by mouth every morning.     [provider]  Calcium Carbonate-Vitamin D (CALCIUM 600+D) 600-400 MG-UNIT per tablet Take 1 tablet by mouth 2 (two) times daily.      [provider]  carvedilol (COREG) 3.125 MG tablet TAKE 1 TABLET (3.125 MG TOTAL) BY MOUTH 2 (TWO) TIMES DAILY WITH A MEAL. 10/18/18   Nahser, Wonda Cheng, MD  clopidogrel (PLAVIX) 75 MG tablet TAKE 1 TABLET (75 MG TOTAL) BY MOUTH DAILY WITH BREAKFAST. 10/18/18   Nahser, Wonda Cheng, MD  cycloSPORINE (RESTASIS) 0.05 % ophthalmic emulsion Place 1 drop into both eyes 2 (two) times daily as needed (DRY EYE).     [provider]  Desoximetasone 0.05 % GEL Apply 1 application topically as needed (AS NEEDED TO AFFECTED AREA).  08/12/15   [provider]  Glucosamine-Chondroit-Vit C-Mn (GLUCOSAMINE CHONDR 1500 COMPLX) CAPS Take 1 capsule by mouth every morning.     [provider]  loratadine (CLARITIN) 10 MG tablet Take 10 mg by mouth daily.      [provider]  Multiple Vitamin (MULTIVITAMIN) capsule Take 1 capsule by mouth daily.      [provider]  nitroGLYCERIN (NITROSTAT) 0.4 MG SL tablet Place 1 tablet (0.4 mg total) under the tongue every 5 (five) minutes as needed for chest pain. 10/30/17   Nahser, Wonda Cheng, MD  pantoprazole (PROTONIX) 40 MG tablet TAKE 1 TABLET BY MOUTH EVERY DAY 10/18/18   Nahser, Wonda Cheng, MD  PAZEO 0.7 % SOLN INSTILL 1 DROP INTO BOTH EYES DAILY AS NEEDED FOR ALLERGIES 02/04/17   [provider]  temazepam (RESTORIL) 30 MG capsule Take 1 capsule (30 mg total) by mouth at bedtime as needed for sleep. 08/13/18   Volanda Napoleon, MD  warfarin (COUMADIN) 5 MG tablet TAKE AS DIRECTED BY COUMADIN CLINIC 08/15/18   Nahser, Wonda Cheng, MD    Family History Family History  Problem Relation Age of Onset  . Heart attack Mother 64  . Osteoarthritis Brother         hpercholesterolemia    Social History Social History   Tobacco Use  . Smoking status: Never Smoker  . Smokeless tobacco: Never Used  Substance Use Topics  . Alcohol use: Yes    Alcohol/week: 0.0 standard drinks    Comment: socially  . Drug use: No     Allergies   Latex, Meperidine, Sulfa antibiotics, Crestor [rosuvastatin calcium], Pravastatin, and Sulfamethoxazole   Review of Systems Review of Systems  Constitutional: Negative for chills and fever.  HENT: Positive for ear discharge. Negative for congestion, ear pain, rhinorrhea and sore throat.   Eyes: Negative for pain and visual disturbance.  Respiratory: Negative for cough and shortness of breath.   Cardiovascular: Negative for chest pain and palpitations.  Gastrointestinal: Negative for abdominal pain and vomiting.  Genitourinary: Negative for dysuria and hematuria.  Musculoskeletal:  Negative for arthralgias and back pain.  Skin: Negative for color change and rash.  Neurological: Negative for seizures and syncope.  All other systems reviewed and are negative.    Physical Exam Triage Vital Signs ED Triage Vitals  Enc Vitals Group     BP      Pulse      Resp      Temp      Temp src      SpO2      Weight      Height      Head Circumference      Peak Flow      Pain Score      Pain Loc      Pain Edu?      Excl. in [place]?    No data found.  Updated Vital Signs BP (!) 153/56 (BP Location: Right Arm)   Pulse 60   Temp 98.1 F (36.7 C) (Oral)   Resp 17   SpO2 98%   Visual Acuity Right Eye Distance:   Left Eye Distance:   Bilateral Distance:    Right Eye Near:   Left Eye Near:    Bilateral Near:     Physical Exam Vitals signs and nursing note reviewed.  Constitutional:      General: She is not in acute distress.    Appearance: She is well-developed.  HENT:     Head: Normocephalic and atraumatic.     Right Ear: Tympanic membrane and external ear normal. There is impacted cerumen.     Left Ear:  Tympanic membrane normal.     Nose: Nose normal.     Mouth/Throat:     Mouth: Mucous membranes are moist.     Pharynx: Oropharynx is clear.  Eyes:     Conjunctiva/sclera: Conjunctivae normal.  Neck:     Musculoskeletal: Neck supple.  Cardiovascular:     Rate and Rhythm: Normal rate and regular rhythm.     Heart sounds: No murmur.  Pulmonary:     Effort: Pulmonary effort is normal. No respiratory distress.     Breath sounds: Normal breath sounds.  Abdominal:     Palpations: Abdomen is soft.     Tenderness: There is no abdominal tenderness.  Skin:    General: Skin is warm and dry.  Neurological:     Mental Status: She is alert.      UC Treatments / Results  Labs (all labs ordered are listed, but only abnormal results are displayed) Labs Reviewed - No data to display  EKG   Radiology No results found.  Procedures Ear Cerumen Removal  Date/Time: 10/21/2018 11:43 AM Performed by: Sharion Balloon, NP Authorized by: Sharion Balloon, NP   Consent:    Consent obtained:  Verbal   Consent given by:  Patient Procedure details:    Location:  R ear   Procedure type: irrigation   Post-procedure details:    Patient tolerance of procedure:  Tolerated well, no immediate complications   (including critical care time)  Medications Ordered in UC Medications - No data to display  Initial Impression / Assessment and Plan / UC Course  I have reviewed the triage vital signs and the nursing notes.  Pertinent labs & imaging results that were available during my care of the patient were reviewed by me and considered in my medical decision making (see chart for details).   Right ear impacted cerumen. Right ear otorrhagia, resolved before arrival.  Able to remove some  of the cerumen and visualize the TM which is intact.  Patient tolerated irrigation well and had no active bleeding from her ear.  Instructed patient to return here, follow-up with her PCP, or follow-up with her ENT if her  ear bleeding starts again, if she has hearing loss, ear pain, or other concerning symptoms.     Final Clinical Impressions(s) / UC Diagnoses   Final diagnoses:  Impacted cerumen of right ear  Otorrhagia of right ear     Discharge Instructions     Return here or follow-up with your primary care provider or with your ENT if your ear begins bleeding again, if you have hearing loss, or ear pain.    Try not to insert any tissues or cloth in your ear as this may cause it to start bleeding again.        ED Prescriptions    None     Controlled Substance Prescriptions [place] Controlled Substance Registry consulted? Not Applicable   Sharion Balloon, NP 10/21/18 1150

## 2018-10-21 NOTE — Discharge Instructions (Signed)
Return here or follow-up with your primary care provider or with your ENT if your ear begins bleeding again, if you have hearing loss, or ear pain.    Try not to insert any tissues or cloth in your ear as this may cause it to start bleeding again.

## 2018-10-21 NOTE — ED Triage Notes (Signed)
Pt presents with bleeding coming from inside of right ear from unknown source.

## 2018-10-23 ENCOUNTER — Ambulatory Visit: Payer: Medicare Other | Admitting: *Deleted

## 2018-10-23 ENCOUNTER — Other Ambulatory Visit: Payer: Self-pay

## 2018-10-23 DIAGNOSIS — M25672 Stiffness of left ankle, not elsewhere classified: Secondary | ICD-10-CM

## 2018-10-23 DIAGNOSIS — M6281 Muscle weakness (generalized): Secondary | ICD-10-CM

## 2018-10-23 DIAGNOSIS — M25572 Pain in left ankle and joints of left foot: Secondary | ICD-10-CM | POA: Diagnosis not present

## 2018-10-23 DIAGNOSIS — R2681 Unsteadiness on feet: Secondary | ICD-10-CM

## 2018-10-23 NOTE — Therapy (Signed)
[facility] [place], [place], [address] Phone: [phone]   Fax:  [phone]  Physical Therapy Treatment  Patient Details  Name: Amanda Wiggins MRN: [id number] Date of Birth: [birth year] Referring Provider (PT): Marchia Bond, MD   Encounter Date: 10/23/2018  PT End of Session - 10/23/18 0914    Visit Number  5    Number of Visits  12    Date for PT Re-Evaluation  11/27/18    Authorization Type  FOTO; Progress note every 10th visit;    PT Start Time  0900    PT Stop Time  0951    PT Time Calculation (min)  51 min       Past Medical History:  Diagnosis Date  . Aortic valve replaced   . Breast cancer ([place]) 2005  . FHx: migraine headaches   . GERD (gastroesophageal reflux disease)   . Hiatal hernia   . Hx Breast Cancer, IDC, Stage I 07/03/2003  . Hx of breast cancer   . Hyperlipidemia   . Hypertension   . Menopausal syndrome   . Osteopenia   . 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 Personal history of radiation therapy   . Status post aortic valve repair     Past Surgical History:  Procedure Laterality Date  . AORTIC VALVE REPLACEMENT  2003  . BREAST BIOPSY    . BREAST LUMPECTOMY Left 2005  . CARDIAC CATHETERIZATION     Ejection Fraction   . CARDIAC CATHETERIZATION N/A 05/16/2015   Procedure: Left Heart Cath and Coronary Angiography;  Surgeon: Leonie Man, MD;  Location: [facility];  Service: Cardiovascular;  Laterality: N/A;  . CARDIAC CATHETERIZATION  05/16/2015   Procedure: Coronary Stent Intervention;  Surgeon: Leonie Man, MD;  Location: [facility];  Service: Cardiovascular;;  . CARDIAC CATHETERIZATION N/A 05/19/2015   Procedure: Left Heart Cath and Coronary Angiography;  Surgeon: Troy Sine, MD;  Location: [facility];  Service: Cardiovascular;  Laterality: N/A;  . CARDIAC CATHETERIZATION N/A 05/19/2015   Procedure: Coronary Stent Intervention;  Surgeon:  Troy Sine, MD;  Location: [facility];  Service: Cardiovascular;  Laterality: N/A;  . HIATAL HERNIA REPAIR    . HIATAL HERNIA REPAIR  05/11/2007  . hysterectomy, endometiral cancer  2001  . lummpectomy breast cancer  07/03/2003  . nissan fundoplicaiton  2330   post-op hematoma on heparin   . right shouler surgery      There were no vitals filed for this visit.  Subjective Assessment - 10/23/18 0909    Subjective  COVID19 screening performed prior to entering the building.Doing fair today. No pain just soreness and mild swelling. Did ok after last Rx, but just sore.    Limitations  Standing;Walking;House hold activities    Diagnostic tests  x-ray: routine healing    Patient Stated Goals  improve walking time    Currently in Pain?  Yes    Pain Score  4     Pain Location  Ankle    Pain Orientation  Left    Pain Descriptors / Indicators  Sore                       [facility] Adult PT Treatment/Exercise - 10/23/18 0001      Exercises   Exercises  Ankle;Knee/Hip      Knee/Hip Exercises: Aerobic   Nustep  L4 x 15 mins seat 7  AFO donned      Modalities   Modalities  Vasopneumatic      Vasopneumatic   Number Minutes Vasopneumatic   15 minutes    Vasopnuematic Location   Ankle    Vasopneumatic Pressure  Low    Vasopneumatic Temperature   34      Ankle Exercises: Standing   Rocker Board  5 minutes   PF/DF balance    Heel Raises  10 reps;20 reps    Other Standing Ankle Exercises  LT ankle dyna disc x 4 mins all motions      Ankle Exercises: Supine   T-Band  seated yellow band    eversion 3x 10                   PT Long Term Goals - 10/09/18 1421      PT LONG TERM GOAL #1   Title  Patient will be independent with HEP    Time  6    Period  Weeks    Status  New      PT LONG TERM GOAL #2   Title  Patient will demonstrate 4/5 left ankle MMT to improve stability during functional tasks.    Time  6    Period  Days    Status  New      PT  LONG TERM GOAL #3   Title  Patient will demonstrate 8 degrees or greater of  left ankle DF AROM to improve gait mechanics.    Time  6    Period  Weeks    Status  New      PT LONG TERM GOAL #4   Title  Patient will demonstrate ability to walk greater than 15 minutes with left ankle pain less than or equal to 3/10 to return to walking program for exercise.    Time  6    Period  Weeks    Status  New            Plan - 10/23/18 0957    Clinical Impression Statement  Pt arrived today with mild soreness Lt ankle 4/10, but was ableto complete all therex without complaints. Normal modality response today .    Rehab Potential  Excellent    PT Frequency  2x / week    PT Duration  6 weeks    PT Treatment/Interventions  ADLs/Self Care Home Management;Cryotherapy;Electrical Stimulation;Moist Heat;Ultrasound;Gait training;Stair training;Functional mobility training;Therapeutic activities;Therapeutic exercise;Balance training;Passive range of motion;Patient/family education;Neuromuscular re-education;Manual techniques;Vasopneumatic Device;Taping    PT Next Visit Plan  Create HEP. nustep or bike with low resistance, gentle AROM and PROM of left ankle, modalities PRN for pain relief.    Consulted and Agree with Plan of Care  Patient       Patient will benefit from skilled therapeutic intervention in order to improve the following deficits and impairments:     Visit Diagnosis: 1. Pain in left ankle and joints of left foot   2. Stiffness of left ankle, not elsewhere classified   3. Muscle weakness (generalized)   4. Unsteadiness on feet        Problem List Patient Active Problem List   Diagnosis Date Noted  . Chronic systolic CHF (congestive heart failure) ([place]) 05/23/2015  . CAD S/P urgent PCI 05/16/15 05/19/2015  . Cardiomyopathy, ischemic 05/19/2015  . ST elevation (STEMI) myocardial infarction involving left anterior descending coronary artery ([place])   . Acute ST elevation myocardial  infarction (STEMI) of anterolateral wall ([place]) 05/16/2015  .  Encounter for therapeutic drug monitoring 07/19/2013  . 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 Aortic valve replaced 01/25/2011  . Long term current use of anticoagulant therapy 06/25/2010  . INFECTIOUS DIARRHEA 03/20/2008  . DIARRHEA-PRESUMED INFECTIOUS 03/20/2008  . URTICARIA 03/13/2008  . FATIGUE 09/12/2007  . DIARRHEA 08/29/2007  . ABDOMINAL PAIN, GENERALIZED, CHRONIC 08/29/2007  . Allergic rhinitis 05/09/2007  . HIATAL HERNIA 05/09/2007  . ARTHRITIS 05/09/2007  . OSTEOPENIA 05/09/2007  . GERD 01/23/2007  . CANCER, ENDOMETRIUM 07/25/2006  . Dyslipidemia 07/25/2006  . COMMON MIGRAINE 07/25/2006  . Essential hypertension 07/25/2006  . Hx Breast Cancer, IDC, Stage I 07/03/2003    Alivea Gladson,CHRIS, PTA 10/23/2018, 10:05 AM  [facility] [place], [place], [address] Phone: [phone]   Fax:  [phone]  Name: ALMETER WESTHOFF MRN: [id number] Date of Birth: 1945/08/21

## 2018-10-25 ENCOUNTER — Other Ambulatory Visit: Payer: Self-pay

## 2018-10-25 ENCOUNTER — Ambulatory Visit (INDEPENDENT_AMBULATORY_CARE_PROVIDER_SITE_OTHER): Payer: Medicare Other | Admitting: Pharmacist

## 2018-10-25 DIAGNOSIS — Z5181 Encounter for therapeutic drug level monitoring: Secondary | ICD-10-CM

## 2018-10-25 DIAGNOSIS — Z952 Presence of prosthetic heart valve: Secondary | ICD-10-CM

## 2018-10-25 LAB — POCT INR: INR: 2.7 (ref 2.0–3.0)

## 2018-10-25 NOTE — Patient Instructions (Signed)
Continue taking 1/2 tablet daily except 1 tablet on Tuesdays, Thursdays, and Saturdays. Recheck in 6 weeks. Coumadin Clinic [phone]

## 2018-10-25 NOTE — Progress Notes (Signed)
Needs return date

## 2018-10-26 ENCOUNTER — Encounter: Payer: Self-pay | Admitting: Physical Therapy

## 2018-10-26 ENCOUNTER — Other Ambulatory Visit: Payer: Self-pay

## 2018-10-26 ENCOUNTER — Ambulatory Visit: Payer: Medicare Other | Admitting: Physical Therapy

## 2018-10-26 DIAGNOSIS — M25672 Stiffness of left ankle, not elsewhere classified: Secondary | ICD-10-CM

## 2018-10-26 DIAGNOSIS — M25572 Pain in left ankle and joints of left foot: Secondary | ICD-10-CM

## 2018-10-26 NOTE — Therapy (Signed)
[facility] [place], [place], [address] Phone: [phone]   Fax:  [phone]  Physical Therapy Treatment  Patient Details  Name: Amanda Wiggins MRN: [id number] Date of Birth: [birth year] Referring Provider (PT): Marchia Bond, MD   Encounter Date: 10/26/2018  PT End of Session - 10/26/18 0908    Visit Number  6    Number of Visits  12    Date for PT Re-Evaluation  11/27/18    Authorization Type  FOTO; Progress note every 10th visit;    PT Start Time  0900    PT Stop Time  0953    PT Time Calculation (min)  53 min    Equipment Utilized During Treatment  Other (comment)   L ankle brace   Activity Tolerance  Patient tolerated treatment well    Behavior During Therapy  [facility] for tasks assessed/performed       Past Medical History:  Diagnosis Date  . Aortic valve replaced   . Breast cancer ([place]) 2005  . FHx: migraine headaches   . GERD (gastroesophageal reflux disease)   . Hiatal hernia   . Hx Breast Cancer, IDC, Stage I 07/03/2003  . Hx of breast cancer   . Hyperlipidemia   . Hypertension   . Menopausal syndrome   . Osteopenia   . 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 Personal history of radiation therapy   . Status post aortic valve repair     Past Surgical History:  Procedure Laterality Date  . AORTIC VALVE REPLACEMENT  2003  . BREAST BIOPSY    . BREAST LUMPECTOMY Left 2005  . CARDIAC CATHETERIZATION     Ejection Fraction   . CARDIAC CATHETERIZATION N/A 05/16/2015   Procedure: Left Heart Cath and Coronary Angiography;  Surgeon: Leonie Man, MD;  Location: [place] [facility];  Service: Cardiovascular;  Laterality: N/A;  . CARDIAC CATHETERIZATION  05/16/2015   Procedure: Coronary Stent Intervention;  Surgeon: Leonie Man, MD;  Location: [place] [facility];  Service: Cardiovascular;;  . CARDIAC CATHETERIZATION N/A 05/19/2015   Procedure: Left Heart Cath and Coronary Angiography;   Surgeon: Troy Sine, MD;  Location: [place] [facility];  Service: Cardiovascular;  Laterality: N/A;  . CARDIAC CATHETERIZATION N/A 05/19/2015   Procedure: Coronary Stent Intervention;  Surgeon: Troy Sine, MD;  Location: [place] [facility];  Service: Cardiovascular;  Laterality: N/A;  . HIATAL HERNIA REPAIR    . HIATAL HERNIA REPAIR  05/11/2007  . hysterectomy, endometiral cancer  2001  . lummpectomy breast cancer  07/03/2003  . nissan fundoplicaiton  [id number]   post-op hematoma on heparin   . right shouler surgery      There were no vitals filed for this visit.  Subjective Assessment - 10/26/18 0908    Subjective  COVID 19 screening performed on patient upon arrival. Patient reports she would be disappointed if she went to MD and he said to stay in ASO longer.    Limitations  Standing;Walking;House hold activities    Diagnostic tests  x-ray: routine healing    Patient Stated Goals  improve walking time    Currently in Pain?  No/denies         [facility] PT Assessment - 10/26/18 0001      Assessment   Medical Diagnosis  Left anterior/distal fibula avulsion fracture, plantar fasciitis    Referring Provider (PT)  Marchia Bond, MD    Onset Date/Surgical Date  09/18/18  Next MD Visit  October 31, 2018    Prior Therapy  no      Precautions   Precautions  None    Required Braces or Orthoses  Other Brace/Splint    Other Brace/Splint  left ASO      Restrictions   Weight Bearing Restrictions  No                   [facility] Adult PT Treatment/Exercise - 10/26/18 0001      Modalities   Modalities  Vasopneumatic      Vasopneumatic   Number Minutes Vasopneumatic   15 minutes    Vasopnuematic Location   Ankle    Vasopneumatic Pressure  Low    Vasopneumatic Temperature   34      Ankle Exercises: Aerobic   Nustep  L4 x15 min, ASO donned      Ankle Exercises: Standing   Rocker Board  5 minutes    Heel Raises  Both   x30 reps   Toe Raise  20 reps    Other Standing  Ankle Exercises  L ankle dynadisc ant/post, inv/ev       Ankle Exercises: Seated   ABC's  1 rep    Other Seated Ankle Exercises  L ankle isolator 1# Df x30 reps, inv/ev x30 reps                  PT Long Term Goals - 10/09/18 1421      PT LONG TERM GOAL #1   Title  Patient will be independent with HEP    Time  6    Period  Weeks    Status  New      PT LONG TERM GOAL #2   Title  Patient will demonstrate 4/5 left ankle MMT to improve stability during functional tasks.    Time  6    Period  Days    Status  New      PT LONG TERM GOAL #3   Title  Patient will demonstrate 8 degrees or greater of  left ankle DF AROM to improve gait mechanics.    Time  6    Period  Weeks    Status  New      PT LONG TERM GOAL #4   Title  Patient will demonstrate ability to walk greater than 15 minutes with left ankle pain less than or equal to 3/10 to return to walking program for exercise.    Time  6    Period  Weeks    Status  New            Plan - 10/26/18 1017    Clinical Impression Statement  Patient presented in clinic with no complaints of any L ankle pain or limitation within her home enviroment. Patient worried of growing dependent on ASO brace as she notices without ASO her gait is affected. No reports of pain or discomfort with any of the exercises. More limitation with inversion/eversion movement of L ankle but ASO donned throughout therex. Normal vasopnuematic response noted following removal of the modality.    Stability/Clinical Decision Making  Stable/Uncomplicated    Rehab Potential  Excellent    PT Frequency  2x / week    PT Duration  6 weeks    PT Treatment/Interventions  ADLs/Self Care Home Management;Cryotherapy;Electrical Stimulation;Moist Heat;Ultrasound;Gait training;Stair training;Functional mobility training;Therapeutic activities;Therapeutic exercise;Balance training;Passive range of motion;Patient/family education;Neuromuscular re-education;Manual  techniques;Vasopneumatic Device;Taping    PT Next Visit Plan  MD  note required next visit.    Consulted and Agree with Plan of Care  Patient       Patient will benefit from skilled therapeutic intervention in order to improve the following deficits and impairments:  Decreased activity tolerance, Decreased strength, Decreased range of motion, Difficulty walking, Pain  Visit Diagnosis: Pain in left ankle and joints of left foot  Stiffness of left ankle, not elsewhere classified     Problem List Patient Active Problem List   Diagnosis Date Noted  . Chronic systolic CHF (congestive heart failure) ([place]) 05/23/2015  . CAD S/P urgent PCI 05/16/15 05/19/2015  . Cardiomyopathy, ischemic 05/19/2015  . ST elevation (STEMI) myocardial infarction involving left anterior descending coronary artery ([place])   . Acute ST elevation myocardial infarction (STEMI) of anterolateral wall ([place]) 05/16/2015  . Encounter for therapeutic drug monitoring 07/19/2013  . 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 Aortic valve replaced 01/25/2011  . Long term current use of anticoagulant therapy 06/25/2010  . INFECTIOUS DIARRHEA 03/20/2008  . DIARRHEA-PRESUMED INFECTIOUS 03/20/2008  . URTICARIA 03/13/2008  . FATIGUE 09/12/2007  . DIARRHEA 08/29/2007  . ABDOMINAL PAIN, GENERALIZED, CHRONIC 08/29/2007  . Allergic rhinitis 05/09/2007  . HIATAL HERNIA 05/09/2007  . ARTHRITIS 05/09/2007  . OSTEOPENIA 05/09/2007  . GERD 01/23/2007  . CANCER, ENDOMETRIUM 07/25/2006  . Dyslipidemia 07/25/2006  . COMMON MIGRAINE 07/25/2006  . Essential hypertension 07/25/2006  . Hx Breast Cancer, IDC, Stage I 07/03/2003    Standley Brooking, PTA 10/26/2018, 10:23 AM  [facility] [address] [place], [place], [address] Phone: [phone]   Fax:  [phone]  Name: Amanda Wiggins MRN: [id number] Date of Birth: 1945-06-07

## 2018-10-29 ENCOUNTER — Encounter: Payer: Self-pay | Admitting: Physical Therapy

## 2018-10-29 ENCOUNTER — Other Ambulatory Visit: Payer: Self-pay

## 2018-10-29 ENCOUNTER — Ambulatory Visit: Payer: Medicare Other | Admitting: Physical Therapy

## 2018-10-29 DIAGNOSIS — M25572 Pain in left ankle and joints of left foot: Secondary | ICD-10-CM

## 2018-10-29 DIAGNOSIS — M25672 Stiffness of left ankle, not elsewhere classified: Secondary | ICD-10-CM

## 2018-10-29 NOTE — Therapy (Signed)
[facility] [place], [place], [address] Phone: [phone]   Fax:  [phone]  Physical Therapy Treatment  Patient Details  Name: Amanda Wiggins MRN: [id number] Date of Birth: Sep 25, 1945 Referring Provider (PT): Marchia Bond, MD   Encounter Date: 10/29/2018  PT End of Session - 10/29/18 1039    Visit Number  7    Number of Visits  12    Date for PT Re-Evaluation  11/27/18    Authorization Type  FOTO; Progress note every 10th visit;    PT Start Time  1031    PT Stop Time  1120    PT Time Calculation (min)  49 min    Equipment Utilized During Treatment  Other (comment)   left ASO   Activity Tolerance  Patient tolerated treatment well    Behavior During Therapy  [facility] for tasks assessed/performed       Past Medical History:  Diagnosis Date  . Aortic valve replaced   . Breast cancer ([place]) 2005  . FHx: migraine headaches   . GERD (gastroesophageal reflux disease)   . Hiatal hernia   . Hx Breast Cancer, IDC, Stage I 07/03/2003  . Hx of breast cancer   . Hyperlipidemia   . Hypertension   . Menopausal syndrome   . Osteopenia   . 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 Personal history of radiation therapy   . Status post aortic valve repair     Past Surgical History:  Procedure Laterality Date  . AORTIC VALVE REPLACEMENT  2003  . BREAST BIOPSY    . BREAST LUMPECTOMY Left 2005  . CARDIAC CATHETERIZATION     Ejection Fraction   . CARDIAC CATHETERIZATION N/A 05/16/2015   Procedure: Left Heart Cath and Coronary Angiography;  Surgeon: Leonie Man, MD;  Location: [place] [facility];  Service: Cardiovascular;  Laterality: N/A;  . CARDIAC CATHETERIZATION  05/16/2015   Procedure: Coronary Stent Intervention;  Surgeon: Leonie Man, MD;  Location: [place] [facility];  Service: Cardiovascular;;  . CARDIAC CATHETERIZATION N/A 05/19/2015   Procedure: Left Heart Cath and Coronary Angiography;  Surgeon:  Troy Sine, MD;  Location: [place] [facility];  Service: Cardiovascular;  Laterality: N/A;  . CARDIAC CATHETERIZATION N/A 05/19/2015   Procedure: Coronary Stent Intervention;  Surgeon: Troy Sine, MD;  Location: [place] [facility];  Service: Cardiovascular;  Laterality: N/A;  . HIATAL HERNIA REPAIR    . HIATAL HERNIA REPAIR  05/11/2007  . hysterectomy, endometiral cancer  2001  . lummpectomy breast cancer  07/03/2003  . nissan fundoplicaiton  [id number]   post-op hematoma on heparin   . right shouler surgery      There were no vitals filed for this visit.  Subjective Assessment - 10/29/18 1038    Subjective  COVID 19 screening performed on patient upon arrival. Patient reports she hasn't had any pain and has been returned to her walking program with her ASO.    Limitations  Standing;Walking;House hold activities    Diagnostic tests  x-ray: routine healing    Patient Stated Goals  improve walking time    Currently in Pain?  No/denies         [facility] PT Assessment - 10/29/18 0001      Assessment   Medical Diagnosis  Left anterior/distal fibula avulsion fracture, plantar fasciitis    Referring Provider (PT)  Marchia Bond, MD    Onset Date/Surgical Date  09/18/18    Next  MD Visit  October 31, 2018    Prior Therapy  no      Precautions   Required Braces or Orthoses  Other Brace/Splint    Other Brace/Splint  left ASO      ROM / Strength   AROM / PROM / Strength  Strength      AROM   Left Ankle Dorsiflexion  8    Left Ankle Plantar Flexion  50      Strength   Strength Assessment Site  Ankle    Right/Left Ankle  Left    Left Ankle Dorsiflexion  4/5    Left Ankle Plantar Flexion  4/5    Left Ankle Inversion  4/5    Left Ankle Eversion  4-/5                   [facility] Adult PT Treatment/Exercise - 10/29/18 0001      Modalities   Modalities  Vasopneumatic      Vasopneumatic   Number Minutes Vasopneumatic   15 minutes    Vasopnuematic Location   Ankle     Vasopneumatic Pressure  Medium    Vasopneumatic Temperature   34      Ankle Exercises: Aerobic   Nustep  L4 x15 min, ASO donned      Ankle Exercises: Standing   Rocker Board  --    Heel Raises  Both   2x10 eccentric control; without brace   Other Standing Ankle Exercises  L ankle dynadisc ant/post, inv/ev, CW/CCW circles 1 min each; without brace      Ankle Exercises: Supine   T-Band  seated yellow band eversion 3x 10                   PT Long Term Goals - 10/29/18 1040      PT LONG TERM GOAL #1   Title  Patient will be independent with HEP    Time  6    Period  Weeks    Status  Achieved      PT LONG TERM GOAL #2   Title  Patient will demonstrate 4/5 left ankle MMT to improve stability during functional tasks.    Time  6    Period  Days    Status  On-going      PT LONG TERM GOAL #3   Title  Patient will demonstrate 8 degrees or greater of  left ankle DF AROM to improve gait mechanics.    Time  6    Period  Weeks    Status  Achieved      PT LONG TERM GOAL #4   Title  Patient will demonstrate ability to walk greater than 15 minutes with left ankle pain less than or equal to 3/10 to return to walking program for exercise.    Time  6    Period  Weeks    Status  Achieved            Plan - 10/29/18 1128    Clinical Impression Statement  Patient arrives with no reports of pain. Patient was able to tolerate TEs without ASO donned and just reported a slight soreness. Patient's goals are ongoing at this time and would benefit from continuing to address balance and dynamic stability for left ankle.  Patient to see MD for follow up on Wednesday. Patient prefers medium compression on vaso.    Stability/Clinical Decision Making  Stable/Uncomplicated    Clinical Decision Making  Low  Rehab Potential  Excellent    PT Frequency  2x / week    PT Duration  6 weeks    PT Treatment/Interventions  ADLs/Self Care Home Management;Cryotherapy;Electrical Stimulation;Moist  Heat;Ultrasound;Gait training;Stair training;Functional mobility training;Therapeutic activities;Therapeutic exercise;Balance training;Passive range of motion;Patient/family education;Neuromuscular re-education;Manual techniques;Vasopneumatic Device;Taping    PT Next Visit Plan  Continue ankle strengthening and add balance activities.       Patient will benefit from skilled therapeutic intervention in order to improve the following deficits and impairments:  Decreased activity tolerance, Decreased strength, Decreased range of motion, Difficulty walking, Pain  Visit Diagnosis: Pain in left ankle and joints of left foot  Stiffness of left ankle, not elsewhere classified     Problem List Patient Active Problem List   Diagnosis Date Noted  . Chronic systolic CHF (congestive heart failure) ([place]) 05/23/2015  . CAD S/P urgent PCI 05/16/15 05/19/2015  . Cardiomyopathy, ischemic 05/19/2015  . ST elevation (STEMI) myocardial infarction involving left anterior descending coronary artery ([place])   . Acute ST elevation myocardial infarction (STEMI) of anterolateral wall ([place]) 05/16/2015  . Encounter for therapeutic drug monitoring 07/19/2013  . 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 Aortic valve replaced 01/25/2011  . Long term current use of anticoagulant therapy 06/25/2010  . INFECTIOUS DIARRHEA 03/20/2008  . DIARRHEA-PRESUMED INFECTIOUS 03/20/2008  . URTICARIA 03/13/2008  . FATIGUE 09/12/2007  . DIARRHEA 08/29/2007  . ABDOMINAL PAIN, GENERALIZED, CHRONIC 08/29/2007  . Allergic rhinitis 05/09/2007  . HIATAL HERNIA 05/09/2007  . ARTHRITIS 05/09/2007  . OSTEOPENIA 05/09/2007  . GERD 01/23/2007  . CANCER, ENDOMETRIUM 07/25/2006  . Dyslipidemia 07/25/2006  . COMMON MIGRAINE 07/25/2006  . Essential hypertension 07/25/2006  . Hx Breast Cancer, IDC, Stage I 07/03/2003    Gabriela Eves, PT, DPT 10/29/2018, 12:22 PM  [facility] [address] [place], [place], [address] Phone: [phone]   Fax:  [phone]  Name: LYRAE MESNARD MRN: [id number] Date of Birth: 01-13-1946

## 2018-10-30 DIAGNOSIS — Z7901 Long term (current) use of anticoagulants: Secondary | ICD-10-CM | POA: Insufficient documentation

## 2018-11-05 ENCOUNTER — Other Ambulatory Visit: Payer: Self-pay

## 2018-11-05 ENCOUNTER — Ambulatory Visit: Payer: Medicare Other | Admitting: Physical Therapy

## 2018-11-05 ENCOUNTER — Encounter: Payer: Self-pay | Admitting: Physical Therapy

## 2018-11-05 DIAGNOSIS — M25572 Pain in left ankle and joints of left foot: Secondary | ICD-10-CM

## 2018-11-05 DIAGNOSIS — R2681 Unsteadiness on feet: Secondary | ICD-10-CM

## 2018-11-05 DIAGNOSIS — M25672 Stiffness of left ankle, not elsewhere classified: Secondary | ICD-10-CM

## 2018-11-05 DIAGNOSIS — M6281 Muscle weakness (generalized): Secondary | ICD-10-CM

## 2018-11-05 NOTE — Therapy (Signed)
[facility] [place], [place], [address] Phone: [phone]   Fax:  [phone]  Physical Therapy Treatment  Patient Details  Name: Amanda Wiggins MRN: [id number] Date of Birth: 30-Aug-1945 Referring Provider (PT): Marchia Bond, MD   Encounter Date: 11/05/2018  PT End of Session - 11/05/18 1002    Visit Number  8    Number of Visits  12    Date for PT Re-Evaluation  11/27/18    Authorization Type  FOTO; Progress note every 10th visit;    PT Start Time  0949    PT Stop Time  1039    PT Time Calculation (min)  50 min    Activity Tolerance  Patient tolerated treatment well    Behavior During Therapy  [facility] for tasks assessed/performed       Past Medical History:  Diagnosis Date  . Aortic valve replaced   . Breast cancer ([place]) 2005  . FHx: migraine headaches   . GERD (gastroesophageal reflux disease)   . Hiatal hernia   . Hx Breast Cancer, IDC, Stage I 07/03/2003  . Hx of breast cancer   . Hyperlipidemia   . Hypertension   . Menopausal syndrome   . Osteopenia   . 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 Personal history of radiation therapy   . Status post aortic valve repair     Past Surgical History:  Procedure Laterality Date  . AORTIC VALVE REPLACEMENT  2003  . BREAST BIOPSY    . BREAST LUMPECTOMY Left 2005  . CARDIAC CATHETERIZATION     Ejection Fraction   . CARDIAC CATHETERIZATION N/A 05/16/2015   Procedure: Left Heart Cath and Coronary Angiography;  Surgeon: Leonie Man, MD;  Location: [place] [facility];  Service: Cardiovascular;  Laterality: N/A;  . CARDIAC CATHETERIZATION  05/16/2015   Procedure: Coronary Stent Intervention;  Surgeon: Leonie Man, MD;  Location: [place] [facility];  Service: Cardiovascular;;  . CARDIAC CATHETERIZATION N/A 05/19/2015   Procedure: Left Heart Cath and Coronary Angiography;  Surgeon: Troy Sine, MD;  Location: [place] [facility];  Service:  Cardiovascular;  Laterality: N/A;  . CARDIAC CATHETERIZATION N/A 05/19/2015   Procedure: Coronary Stent Intervention;  Surgeon: Troy Sine, MD;  Location: [place] [facility];  Service: Cardiovascular;  Laterality: N/A;  . HIATAL HERNIA REPAIR    . HIATAL HERNIA REPAIR  05/11/2007  . hysterectomy, endometiral cancer  2001  . lummpectomy breast cancer  07/03/2003  . nissan fundoplicaiton  [id number]   post-op hematoma on heparin   . right shouler surgery      There were no vitals filed for this visit.  Subjective Assessment - 11/05/18 0950    Subjective  COVID 19 screening performed on patient upon arrival. Patient reports that she is allowed out of brace but instructed to use her judgment of when to use brace. Patient went to beach following MD visit. Reports some edema in ankle at the end of the day.    Limitations  Standing;Walking;House hold activities    Diagnostic tests  x-ray: routine healing    Patient Stated Goals  improve walking time    Currently in Pain?  No/denies         [facility] PT Assessment - 11/05/18 0001      Assessment   Medical Diagnosis  Left anterior/distal fibula avulsion fracture, plantar fasciitis    Referring Provider (PT)  Marchia Bond, MD    Onset Date/Surgical  Date  09/18/18    Next MD Visit  None    Prior Therapy  no                   [facility] Adult PT Treatment/Exercise - 11/05/18 0001      Modalities   Modalities  Vasopneumatic      Vasopneumatic   Number Minutes Vasopneumatic   15 minutes    Vasopnuematic Location   Ankle    Vasopneumatic Pressure  Medium    Vasopneumatic Temperature   34      Ankle Exercises: Aerobic   Recumbent Bike  L4, seat 1 x15 min      Ankle Exercises: Standing   Rocker Board  4 minutes    Heel Raises  Both   x20 reps   Toe Raise  20 reps    Other Standing Ankle Exercises  L lunges x15 reps      Ankle Exercises: Seated   Other Seated Ankle Exercises  L ankle inversion/eversion AROM x20 reps                   PT Long Term Goals - 10/29/18 1040      PT LONG TERM GOAL #1   Title  Patient will be independent with HEP    Time  6    Period  Weeks    Status  Achieved      PT LONG TERM GOAL #2   Title  Patient will demonstrate 4/5 left ankle MMT to improve stability during functional tasks.    Time  6    Period  Days    Status  On-going      PT LONG TERM GOAL #3   Title  Patient will demonstrate 8 degrees or greater of  left ankle DF AROM to improve gait mechanics.    Time  6    Period  Weeks    Status  Achieved      PT LONG TERM GOAL #4   Title  Patient will demonstrate ability to walk greater than 15 minutes with left ankle pain less than or equal to 3/10 to return to walking program for exercise.    Time  6    Period  Weeks    Status  Achieved            Plan - 11/05/18 1030    Clinical Impression Statement  Patient presented in clinic with no current L ankle pain. No brace donned to L ankle during today's therex. Patient guided through therex with only reports of fatigue and difficulty with AROM L ankle eversion. No pain reported during therex. Normal vasopneumatic response noted following removal of the modality.    Stability/Clinical Decision Making  Stable/Uncomplicated    Rehab Potential  Excellent    PT Frequency  2x / week    PT Duration  6 weeks    PT Treatment/Interventions  ADLs/Self Care Home Management;Cryotherapy;Electrical Stimulation;Moist Heat;Ultrasound;Gait training;Stair training;Functional mobility training;Therapeutic activities;Therapeutic exercise;Balance training;Passive range of motion;Patient/family education;Neuromuscular re-education;Manual techniques;Vasopneumatic Device;Taping    PT Next Visit Plan  Continue ankle strengthening and add balance activities.    Consulted and Agree with Plan of Care  Patient       Patient will benefit from skilled therapeutic intervention in order to improve the following deficits and  impairments:  Decreased activity tolerance, Decreased strength, Decreased range of motion, Difficulty walking, Pain  Visit Diagnosis: Pain in left ankle and joints of left foot  Stiffness of left  ankle, not elsewhere classified  Muscle weakness (generalized)  Unsteadiness on feet     Problem List Patient Active Problem List   Diagnosis Date Noted  . Chronic systolic CHF (congestive heart failure) ([place]) 05/23/2015  . CAD S/P urgent PCI 05/16/15 05/19/2015  . Cardiomyopathy, ischemic 05/19/2015  . ST elevation (STEMI) myocardial infarction involving left anterior descending coronary artery ([place])   . Acute ST elevation myocardial infarction (STEMI) of anterolateral wall ([place]) 05/16/2015  . Encounter for therapeutic drug monitoring 07/19/2013  . 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 Aortic valve replaced 01/25/2011  . Long term current use of anticoagulant therapy 06/25/2010  . INFECTIOUS DIARRHEA 03/20/2008  . DIARRHEA-PRESUMED INFECTIOUS 03/20/2008  . URTICARIA 03/13/2008  . FATIGUE 09/12/2007  . DIARRHEA 08/29/2007  . ABDOMINAL PAIN, GENERALIZED, CHRONIC 08/29/2007  . Allergic rhinitis 05/09/2007  . HIATAL HERNIA 05/09/2007  . ARTHRITIS 05/09/2007  . OSTEOPENIA 05/09/2007  . GERD 01/23/2007  . CANCER, ENDOMETRIUM 07/25/2006  . Dyslipidemia 07/25/2006  . COMMON MIGRAINE 07/25/2006  . Essential hypertension 07/25/2006  . Hx Breast Cancer, IDC, Stage I 07/03/2003    Standley Brooking, PTA 11/05/2018, 10:42 AM  [facility] [address] [place], [place], [address] Phone: [phone]   Fax:  [phone]  Name: Amanda Wiggins MRN: [id number] Date of Birth: 02-13-1946

## 2018-11-06 ENCOUNTER — Encounter: Payer: Self-pay | Admitting: Cardiovascular Disease

## 2018-11-06 ENCOUNTER — Telehealth (INDEPENDENT_AMBULATORY_CARE_PROVIDER_SITE_OTHER): Payer: Medicare Other | Admitting: Cardiovascular Disease

## 2018-11-06 VITALS — BP 118/61 | HR 75 | Ht 61.0 in | Wt 138.0 lb

## 2018-11-06 DIAGNOSIS — I2510 Atherosclerotic heart disease of native coronary artery without angina pectoris: Secondary | ICD-10-CM

## 2018-11-06 DIAGNOSIS — Z952 Presence of prosthetic heart valve: Secondary | ICD-10-CM | POA: Diagnosis not present

## 2018-11-06 DIAGNOSIS — E782 Mixed hyperlipidemia: Secondary | ICD-10-CM

## 2018-11-06 DIAGNOSIS — E785 Hyperlipidemia, unspecified: Secondary | ICD-10-CM

## 2018-11-06 DIAGNOSIS — Z9861 Coronary angioplasty status: Secondary | ICD-10-CM | POA: Diagnosis not present

## 2018-11-06 DIAGNOSIS — Z7189 Other specified counseling: Secondary | ICD-10-CM

## 2018-11-06 NOTE — Patient Instructions (Signed)
Medication Instructions:  Your physician recommends that you continue on your current medications as directed. Please refer to the Current Medication list given to you today.  If you need a refill on your cardiac medications before your next appointment, please call your pharmacy.   Lab work: BMET, Liver and Lipid a few days prior to your follow up with Dr. Acie Fredrickson.  Please come fasting for these labs (nothing to eat or drink after midnight except water and black coffee). If you have labs (blood work) drawn today and your tests are completely normal, you will receive your results only by: Marland Kitchen MyChart Message (if you have MyChart) OR . A paper copy in the mail If you have any lab test that is abnormal or we need to change your treatment, we will call you to review the results.  Testing/Procedures: None  Follow-Up: At [facility], you and your health needs are our priority.  As part of our continuing mission to provide you with exceptional heart care, we have created designated Provider Care Teams.  These Care Teams include your primary Cardiologist (physician) and Advanced Practice Providers (APPs -  Physician Assistants and Nurse Practitioners) who all work together to provide you with the care you need, when you need it. You will need a follow up appointment in:  6 months.  Please call our office 2 months in advance to schedule this appointment.  You may see Mertie Moores, MD or one of the following Advanced Practice Providers on your designated Care Team: Richardson Dopp, PA-C [place], [place] . Daune Perch, NP  Any Other Special Instructions Will Be Listed Below (If Applicable).

## 2018-11-06 NOTE — Progress Notes (Signed)
Virtual Visit via Video Note   This visit type was conducted due to national recommendations for restrictions regarding the COVID-19 Pandemic (e.g. social distancing) in an effort to limit this patient's exposure and mitigate transmission in our community.  Due to her co-morbid illnesses, this patient is at least at moderate risk for complications without adequate follow up.  This format is felt to be most appropriate for this patient at this time.  All issues noted in this document were discussed and addressed.  A limited physical exam was performed with this format.  Please refer to the patient's chart for her consent to telehealth for [facility].   Date:  11/06/2018   ID:  Amanda Wiggins, DOB [birth year], MRN [id number]  Patient Location: Home Provider Location: Office  PCP:  Amanda Ruddy, MD  Cardiologist:  Amanda Moores, MD  Electrophysiologist:  None    Problem List 1. Aortic valve replacement 2. Hyperlipidemia 3. Breast cancer 4. CAD -  1. Mid LAD to Dist LAD lesion, 100% stenosed. Post intervention with a single Synergy DES 2.25 mm x 20 mm S/p mini vision stent to distal LAD     Amanda Wiggins is a 73 year old female with a history of aortic stenosis-status post aortic valve replacement.  She also has a history of hypercholesterolemia.  She's not had any episodes of chest pain or shortness of breath.  She's had her INR levels checked in our Coumadin clinic. Her INR levels have all been therapeutic.  Nov. 6 , 2014  Amanda Wiggins is doing well.  i saw her a year ago.  She is 10 years our from her breast cancer and is doing well.    No cardiac complaints.   Able to do all of her normal activities.     Nov. 13, 2015:  Amanda Wiggins is doing well.   No CP .  No dyspnea.   Has been stressful.  Her mother died this past year.   Her INR levels have been  Well controlled   Nov. 14, 2016:  Doing well.  No cardiac issues.  INR levels have been stable,  A bit low the last time .   Had an AVR 13 years ago .    June 05, 2015:  Amanda Wiggins was recently seen at the hospital for an acute anterior wall ST segment elevation myocardial infarction. She had stenting of her mid LAD. She had recurrent pain on the day of her original discharge and was taken back to the Cath Lab and had a second stent placed. She continued to have some intermittent episodes of chest discomfort. She has had persistent ST segment elevation. Her left ventricular systolic function is moderately depressed with an EF of 40-45%.   She has akinesis of the distal anterior wall and apex.   She is not having the band like chest pain that she had on presentation Has had some GERD.   Asked about going back on Omeprazole  - we discussed her need for Protonix with the plavix .   September 01, 2015:  Amanda Wiggins is seen back for follow up visit. Still having trouble getting over her MI .  Still fatigued but is slowly getting better.   No CP.      Dec. 12, 2017:  feeling ok Does not have the energy that she wants ( and used to have prior to her MI )  BP has been in the 90-100 range.   September 21, 2016:    Amanda Wiggins is seen today  for her AVR and CAD / MI  Her husband, Amanda Wiggins, died last week  Has some leg aches.   Feb. 13, 2019: Amanda Wiggins is seen back today for follow up of her AVR and CHF Echo shows LV EF of 40% , mean AV gradient is 21 mmHg.  Is on coumadin and plavix  Has occasional left shoulder pain  Had stenting of LAD in March 2017.    Has had some balance issues.   Saw ENT. Thinks it may be orthostatic hypotension  Had ears cleaned out.   Feels better  Aug. 26, 2019:  Doing well. Has CAD, AVR   No recent CP , Gets winded with little exertion EF is 40%.    Bruises quite a bit - is on coumadin and plavix     Evaluation Performed:  Follow-Up Visit  Chief Complaint:  CAD, aortic valve replacement   Sept. 1, 2020   Amanda Wiggins is a 73 y.o. female with CAD and aortic valve replacement   She went to  Amanda Wiggins recently ,  Amanda Wiggins are very elevated.  Has been eating more carbs and sweets. Also has not been exercising much . Not taking any recent NTG.  No angina     On coumadin , managed by our coumadin clinic  Hx of AVR in 2003.  No leg swelling, no syncope , Broke her ankle this past June.   Is recovering .   The patient does not have symptoms concerning for COVID-19 infection (fever, chills, cough, or new shortness of breath).    Past Medical History:  Diagnosis Date  . Aortic valve replaced   . Breast cancer ([place]) 2005  . FHx: migraine headaches   . GERD (gastroesophageal reflux disease)   . Hiatal hernia   . Hx Breast Cancer, IDC, Stage I 07/03/2003  . Hx of breast cancer   . Hyperlipidemia   . Hypertension   . Menopausal syndrome   . Osteopenia   . 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 Personal history of radiation therapy   . Status post aortic valve repair    Past Surgical History:  Procedure Laterality Date  . AORTIC VALVE REPLACEMENT  2003  . BREAST BIOPSY    . BREAST LUMPECTOMY Left 2005  . CARDIAC CATHETERIZATION     Ejection Fraction   . CARDIAC CATHETERIZATION N/A 05/16/2015   Procedure: Left Heart Cath and Coronary Angiography;  Surgeon: Leonie Man, MD;  Location: [place] [facility];  Service: Cardiovascular;  Laterality: N/A;  . CARDIAC CATHETERIZATION  05/16/2015   Procedure: Coronary Stent Intervention;  Surgeon: Leonie Man, MD;  Location: [place] [facility];  Service: Cardiovascular;;  . CARDIAC CATHETERIZATION N/A 05/19/2015   Procedure: Left Heart Cath and Coronary Angiography;  Surgeon: Amanda Sine, MD;  Location: [place] [facility];  Service: Cardiovascular;  Laterality: N/A;  . CARDIAC CATHETERIZATION N/A 05/19/2015   Procedure: Coronary Stent Intervention;  Surgeon: Amanda Sine, MD;  Location: [place] [facility];  Service: Cardiovascular;  Laterality: N/A;  . HIATAL HERNIA REPAIR    . HIATAL HERNIA REPAIR  05/11/2007   . hysterectomy, endometiral cancer  2001  . lummpectomy breast cancer  07/03/2003  . nissan fundoplicaiton  [id number]   post-op hematoma on heparin   . right shouler surgery       Current Meds  Medication Sig  . Alirocumab (PRALUENT) 75 MG/ML SOAJ Inject 1 pen into the skin every 14 (fourteen) days.  Marland Kitchen  amoxicillin (AMOXIL) 500 MG capsule TAKE 4 CAPSULES BY MOUTH ONE HOUR PRIOR TO APPOINTMENT  . BIOTIN PO Take 1 tablet by mouth every morning.   . Calcium Carbonate-Vitamin D (CALCIUM 600+D) 600-400 MG-UNIT per tablet Take 1 tablet by mouth 2 (two) times daily.    . carvedilol (COREG) 3.125 MG tablet TAKE 1 TABLET (3.125 MG TOTAL) BY MOUTH 2 (TWO) TIMES DAILY WITH A MEAL.  Marland Kitchen clopidogrel (PLAVIX) 75 MG tablet TAKE 1 TABLET (75 MG TOTAL) BY MOUTH DAILY WITH BREAKFAST.  . cycloSPORINE (RESTASIS) 0.05 % ophthalmic emulsion Place 1 drop into both eyes 2 (two) times daily as needed (DRY EYE).   . Desoximetasone 0.05 % GEL Apply 1 application topically as needed (AS NEEDED TO AFFECTED AREA).   . Glucosamine-Chondroit-Vit C-Mn (GLUCOSAMINE CHONDR 1500 COMPLX) CAPS Take 1 capsule by mouth every morning.   . loratadine (CLARITIN) 10 MG tablet Take 10 mg by mouth daily.    . Multiple Vitamin (MULTIVITAMIN) capsule Take 1 capsule by mouth daily.    . nitroGLYCERIN (NITROSTAT) 0.4 MG SL tablet Place 1 tablet (0.4 mg total) under the tongue every 5 (five) minutes as needed for chest pain.  . pantoprazole (PROTONIX) 40 MG tablet TAKE 1 TABLET BY MOUTH EVERY DAY  . PAZEO 0.7 % SOLN INSTILL 1 DROP INTO BOTH EYES DAILY AS NEEDED FOR ALLERGIES  . temazepam (RESTORIL) 30 MG capsule Take 1 capsule (30 mg total) by mouth at bedtime as needed for sleep.  Marland Kitchen warfarin (COUMADIN) 5 MG tablet TAKE AS DIRECTED BY COUMADIN CLINIC   Current Facility-Administered Medications for the 11/06/18 encounter (Telemedicine) with Nahser, Wonda Cheng, MD  Medication  . 0.9 %  sodium chloride infusion     Allergies:   Latex, Meperidine,  Sulfa antibiotics, Crestor [rosuvastatin calcium], Pravastatin, and Sulfamethoxazole   Social History   Tobacco Use  . Smoking status: Never Smoker  . Smokeless tobacco: Never Used  Substance Use Topics  . Alcohol use: Yes    Alcohol/week: 0.0 standard drinks    Comment: socially  . Drug use: No     Family Hx: The patient's family history includes Heart attack (age of onset: 13) in her mother; Osteoarthritis in her brother.  ROS:   Please see the history of present illness.     All other systems reviewed and are negative.   Prior CV studies:   The following studies were reviewed today:    Labs/Other Tests and Data Reviewed:    EKG:  No ECG reviewed.  Recent Labs: 08/13/2018: ALT 22; BUN 28; Creatinine 1.09; Hemoglobin 14.9; Platelet Count 374; Potassium 4.4; Sodium 139   Recent Lipid Panel Lab Results  Component Value Date/Time   CHOL 132 09/05/2018 09:03 AM   CHOL 153 10/30/2017 09:28 AM   TRIG 272.0 (H) 09/05/2018 09:03 AM   HDL 44.50 09/05/2018 09:03 AM   HDL 51 10/30/2017 09:28 AM   CHOLHDL 3 09/05/2018 09:03 AM   LDLCALC 68 10/30/2017 09:28 AM   LDLDIRECT 54.0 09/05/2018 09:03 AM    Wt Readings from Last 3 Encounters:  11/06/18 138 lb (62.6 kg)  09/05/18 142 lb (64.4 kg)  08/13/18 140 lb 12.8 oz (63.9 kg)     Objective:    Vital Signs:  BP 118/61   Pulse 75   Ht 5\' 1"  (1.549 m)   Wt 138 lb (62.6 kg)   BMI 26.07 kg/m    VITAL SIGNS:  reviewed GEN:  no acute distress EYES:  sclerae anicteric, EOMI -  Extraocular Movements Intact RESPIRATORY:  normal respiratory effort, symmetric expansion CARDIOVASCULAR:  no peripheral edema SKIN:  no rash, lesions or ulcers. MUSCULOSKELETAL:  no obvious deformities. NEURO:  alert and oriented x 3, no obvious focal deficit PSYCH:  normal affect  ASSESSMENT & PLAN:    1. Aortic Valve replacement:   Doing great.  Takes SBE prophylaxis before dental procedure   2.  CAD :  No angina ,    3.  Hyperlipidemia:   On Pralulent , Trigs are elevated.  She has improved her diet and is trying to exercise more.   COVID-19 Education: The signs and symptoms of COVID-19 were discussed with the patient and how to seek care for testing (follow up with PCP or arrange E-visit).  The importance of social distancing was discussed today.  Time:   Today, I have spent 19 minutes with the patient with telehealth technology discussing the above problems.     Medication Adjustments/Labs and Tests Ordered: Current medicines are reviewed at length with the patient today.  Concerns regarding medicines are outlined above.   Tests Ordered: Orders Placed This Encounter  Procedures  . Hepatic function panel  . Lipid panel  . Basic metabolic panel    Medication Changes: No orders of the defined types were placed in this encounter.   Follow Up:  In Person in 6 month(s)  Signed, Amanda Moores, MD  11/06/2018 10:02 AM    [place]

## 2018-11-08 ENCOUNTER — Ambulatory Visit: Payer: Medicare Other | Attending: Orthopedic Surgery | Admitting: Physical Therapy

## 2018-11-08 ENCOUNTER — Other Ambulatory Visit: Payer: Self-pay

## 2018-11-08 ENCOUNTER — Encounter: Payer: Self-pay | Admitting: Physical Therapy

## 2018-11-08 DIAGNOSIS — M25672 Stiffness of left ankle, not elsewhere classified: Secondary | ICD-10-CM | POA: Diagnosis present

## 2018-11-08 DIAGNOSIS — M25572 Pain in left ankle and joints of left foot: Secondary | ICD-10-CM | POA: Insufficient documentation

## 2018-11-08 DIAGNOSIS — M6281 Muscle weakness (generalized): Secondary | ICD-10-CM | POA: Diagnosis present

## 2018-11-08 DIAGNOSIS — R2681 Unsteadiness on feet: Secondary | ICD-10-CM | POA: Diagnosis present

## 2018-11-08 NOTE — Therapy (Signed)
[facility] [place], [place], [address] Phone: [phone]   Fax:  [phone]  Physical Therapy Treatment  Patient Details  Name: Amanda Wiggins MRN: [id number] Date of Birth: [birth year] Referring Provider (PT): Marchia Bond, MD   Encounter Date: 11/08/2018  PT End of Session - 11/08/18 1001    Visit Number  9    Number of Visits  12    Date for PT Re-Evaluation  11/27/18    Authorization Type  FOTO; Progress note every 10th visit;    PT Start Time  0949    PT Stop Time  1032    PT Time Calculation (min)  43 min    Activity Tolerance  Patient tolerated treatment well    Behavior During Therapy  [facility] for tasks assessed/performed       Past Medical History:  Diagnosis Date  . Aortic valve replaced   . Breast cancer ([place]) 2005  . FHx: migraine headaches   . GERD (gastroesophageal reflux disease)   . Hiatal hernia   . Hx Breast Cancer, IDC, Stage I 07/03/2003  . Hx of breast cancer   . Hyperlipidemia   . Hypertension   . Menopausal syndrome   . Osteopenia   . 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 Personal history of radiation therapy   . Status post aortic valve repair     Past Surgical History:  Procedure Laterality Date  . AORTIC VALVE REPLACEMENT  2003  . BREAST BIOPSY    . BREAST LUMPECTOMY Left 2005  . CARDIAC CATHETERIZATION     Ejection Fraction   . CARDIAC CATHETERIZATION N/A 05/16/2015   Procedure: Left Heart Cath and Coronary Angiography;  Surgeon: Leonie Man, MD;  Location: [place] [facility];  Service: Cardiovascular;  Laterality: N/A;  . CARDIAC CATHETERIZATION  05/16/2015   Procedure: Coronary Stent Intervention;  Surgeon: Leonie Man, MD;  Location: [place] [facility];  Service: Cardiovascular;;  . CARDIAC CATHETERIZATION N/A 05/19/2015   Procedure: Left Heart Cath and Coronary Angiography;  Surgeon: Troy Sine, MD;  Location: [place] [facility];  Service:  Cardiovascular;  Laterality: N/A;  . CARDIAC CATHETERIZATION N/A 05/19/2015   Procedure: Coronary Stent Intervention;  Surgeon: Troy Sine, MD;  Location: [place] [facility];  Service: Cardiovascular;  Laterality: N/A;  . HIATAL HERNIA REPAIR    . HIATAL HERNIA REPAIR  05/11/2007  . hysterectomy, endometiral cancer  2001  . lummpectomy breast cancer  07/03/2003  . nissan fundoplicaiton  [id number]   post-op hematoma on heparin   . right shouler surgery      There were no vitals filed for this visit.  Subjective Assessment - 11/08/18 0955    Subjective  COVID 19 screening performed on patient upon arrival. Patient reports that she has minimal increased swelling at the end of the day. Reports that she went walking yesterday afternoon without her brace and still did well.    Limitations  Standing;Walking;House hold activities    Diagnostic tests  x-ray: routine healing    Patient Stated Goals  improve walking time    Currently in Pain?  No/denies         [facility] PT Assessment - 11/08/18 0001      Assessment   Medical Diagnosis  Left anterior/distal fibula avulsion fracture, plantar fasciitis    Referring Provider (PT)  Marchia Bond, MD    Onset Date/Surgical Date  09/18/18    Next MD Visit  None    Prior Therapy  no      Precautions   Precautions  None      Restrictions   Weight Bearing Restrictions  No                   [facility] Adult PT Treatment/Exercise - 11/08/18 0001      Modalities   Modalities  Vasopneumatic      Vasopneumatic   Number Minutes Vasopneumatic   10 minutes    Vasopnuematic Location   Ankle    Vasopneumatic Pressure  Medium    Vasopneumatic Temperature   34      Ankle Exercises: Aerobic   Recumbent Bike  L3, seat 3 x15 min      Ankle Exercises: Standing   Rocker Board  4 minutes    Heel Raises  Both   x20 reps   Toe Raise  20 reps    Other Standing Ankle Exercises  L lunges x15 reps    Other Standing Ankle Exercises  DLS balancing on  BOSU with intermittant UE support x3 min                  PT Long Term Goals - 10/29/18 1040      PT LONG TERM GOAL #1   Title  Patient will be independent with HEP    Time  6    Period  Weeks    Status  Achieved      PT LONG TERM GOAL #2   Title  Patient will demonstrate 4/5 left ankle MMT to improve stability during functional tasks.    Time  6    Period  Days    Status  On-going      PT LONG TERM GOAL #3   Title  Patient will demonstrate 8 degrees or greater of  left ankle DF AROM to improve gait mechanics.    Time  6    Period  Weeks    Status  Achieved      PT LONG TERM GOAL #4   Title  Patient will demonstrate ability to walk greater than 15 minutes with left ankle pain less than or equal to 3/10 to return to walking program for exercise.    Time  6    Period  Weeks    Status  Achieved            Plan - 11/08/18 1038    Clinical Impression Statement  Patient presented in clinic with no complaints regarding L ankle. Patient demonstrated minimal to moderate instability with BOSU DLS but able to control with intermittant UE support. No abnormal edema present upon observation. Normal modality response noted following removal of the modality.    Stability/Clinical Decision Making  Stable/Uncomplicated    Rehab Potential  Excellent    PT Frequency  2x / week    PT Duration  6 weeks    PT Treatment/Interventions  ADLs/Self Care Home Management;Cryotherapy;Electrical Stimulation;Moist Heat;Ultrasound;Gait training;Stair training;Functional mobility training;Therapeutic activities;Therapeutic exercise;Balance training;Passive range of motion;Patient/family education;Neuromuscular re-education;Manual techniques;Vasopneumatic Device;Taping    PT Next Visit Plan  Continue ankle strengthening and add balance activities.    Consulted and Agree with Plan of Care  Patient       Patient will benefit from skilled therapeutic intervention in order to improve the  following deficits and impairments:  Decreased activity tolerance, Decreased strength, Decreased range of motion, Difficulty walking, Pain  Visit Diagnosis: Pain in left ankle and joints of left foot  Stiffness of left ankle, not elsewhere classified  Muscle weakness (generalized)  Unsteadiness on feet     Problem List Patient Active Problem List   Diagnosis Date Noted  . Chronic systolic CHF (congestive heart failure) ([place]) 05/23/2015  . CAD S/P urgent PCI 05/16/15 05/19/2015  . Cardiomyopathy, ischemic 05/19/2015  . ST elevation (STEMI) myocardial infarction involving left anterior descending coronary artery ([place])   . Acute ST elevation myocardial infarction (STEMI) of anterolateral wall ([place]) 05/16/2015  . Encounter for therapeutic drug monitoring 07/19/2013  . 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 Aortic valve replaced 01/25/2011  . Long term current use of anticoagulant therapy 06/25/2010  . INFECTIOUS DIARRHEA 03/20/2008  . DIARRHEA-PRESUMED INFECTIOUS 03/20/2008  . URTICARIA 03/13/2008  . FATIGUE 09/12/2007  . DIARRHEA 08/29/2007  . ABDOMINAL PAIN, GENERALIZED, CHRONIC 08/29/2007  . Allergic rhinitis 05/09/2007  . HIATAL HERNIA 05/09/2007  . ARTHRITIS 05/09/2007  . OSTEOPENIA 05/09/2007  . GERD 01/23/2007  . CANCER, ENDOMETRIUM 07/25/2006  . Dyslipidemia 07/25/2006  . COMMON MIGRAINE 07/25/2006  . Essential hypertension 07/25/2006  . Hx Breast Cancer, IDC, Stage I 07/03/2003    Standley Brooking, PTA 11/08/2018, 10:40 AM  [facility] [address] [place], [place], [address] Phone: [phone]   Fax:  [phone]  Name: Amanda Wiggins MRN: [id number] Date of Birth: October 02, 1945

## 2018-11-14 ENCOUNTER — Encounter: Payer: Self-pay | Admitting: Physical Therapy

## 2018-11-14 ENCOUNTER — Other Ambulatory Visit: Payer: Self-pay

## 2018-11-14 ENCOUNTER — Ambulatory Visit: Payer: Medicare Other | Admitting: Physical Therapy

## 2018-11-14 DIAGNOSIS — M25672 Stiffness of left ankle, not elsewhere classified: Secondary | ICD-10-CM

## 2018-11-14 DIAGNOSIS — M25572 Pain in left ankle and joints of left foot: Secondary | ICD-10-CM

## 2018-11-14 DIAGNOSIS — R2681 Unsteadiness on feet: Secondary | ICD-10-CM

## 2018-11-14 DIAGNOSIS — M6281 Muscle weakness (generalized): Secondary | ICD-10-CM

## 2018-11-14 NOTE — Therapy (Signed)
[facility] [place], [place], [address] Phone: [phone]   Fax:  [phone]  Physical Therapy Treatment  Progress Note Reporting Period 10/09/2018 to 11/14/2018  See note below for Objective Data and Assessment of Progress/Goals. Patient is responding well to therapy and has made significant gains to her ankle. Gabriela Eves, PT, DPT      Patient Details  Name: Amanda Wiggins MRN: [id number] Date of Birth: May 16, 1945 Referring Provider (PT): Marchia Bond, MD   Encounter Date: 11/14/2018  PT End of Session - 11/14/18 0950    Visit Number  10    Number of Visits  12    Date for PT Re-Evaluation  11/27/18    Authorization Type  FOTO; Progress note every 10th visit;    PT Start Time  0947    PT Stop Time  1037    PT Time Calculation (min)  50 min    Activity Tolerance  Patient tolerated treatment well    Behavior During Therapy  [facility] for tasks assessed/performed       Past Medical History:  Diagnosis Date  . Aortic valve replaced   . Breast cancer ([place]) 2005  . FHx: migraine headaches   . GERD (gastroesophageal reflux disease)   . Hiatal hernia   . Hx Breast Cancer, IDC, Stage I 07/03/2003  . Hx of breast cancer   . Hyperlipidemia   . Hypertension   . Menopausal syndrome   . Osteopenia   . 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 Personal history of radiation therapy   . Status post aortic valve repair     Past Surgical History:  Procedure Laterality Date  . AORTIC VALVE REPLACEMENT  2003  . BREAST BIOPSY    . BREAST LUMPECTOMY Left 2005  . CARDIAC CATHETERIZATION     Ejection Fraction   . CARDIAC CATHETERIZATION N/A 05/16/2015   Procedure: Left Heart Cath and Coronary Angiography;  Surgeon: Leonie Man, MD;  Location: [place] [facility];  Service: Cardiovascular;  Laterality: N/A;  . CARDIAC CATHETERIZATION  05/16/2015   Procedure: Coronary Stent Intervention;  Surgeon: Leonie Man, MD;  Location: [place] [facility];  Service: Cardiovascular;;  . CARDIAC CATHETERIZATION N/A 05/19/2015   Procedure: Left Heart Cath and Coronary Angiography;  Surgeon: Troy Sine, MD;  Location: [place] [facility];  Service: Cardiovascular;  Laterality: N/A;  . CARDIAC CATHETERIZATION N/A 05/19/2015   Procedure: Coronary Stent Intervention;  Surgeon: Troy Sine, MD;  Location: [place] [facility];  Service: Cardiovascular;  Laterality: N/A;  . HIATAL HERNIA REPAIR    . HIATAL HERNIA REPAIR  05/11/2007  . hysterectomy, endometiral cancer  2001  . lummpectomy breast cancer  07/03/2003  . nissan fundoplicaiton  [id number]   post-op hematoma on heparin   . right shouler surgery      There were no vitals filed for this visit.  Subjective Assessment - 11/14/18 0947    Subjective  COVID 19 screening performed on patient upon arrival. Patient reports burning at times on lateral side of ankle. Burning sensation is very short lived. Reports soreness after traveling yesterday and making several stops.    Limitations  Standing;Walking;House hold activities    Diagnostic tests  x-ray: routine healing    Patient Stated Goals  improve walking time    Currently in Pain?  No/denies  [place] Adult PT Treatment/Exercise - 11/14/18 0001      Modalities   Modalities  Vasopneumatic      Vasopneumatic   Number Minutes Vasopneumatic   10 minutes    Vasopnuematic Location   Ankle    Vasopneumatic Pressure  Medium    Vasopneumatic Temperature   34      Ankle Exercises: Aerobic   Recumbent Bike  L3, seat 3 x15 min      Ankle Exercises: Standing   Heel Raises  Both;20 reps   3D   Toe Raise  20 reps    Other Standing Ankle Exercises  B step outs with 2# reachs x10 reps each   very controlled for balance     Ankle Exercises: Machines for Strengthening   Cybex Leg Press  2pl x30 reps                  PT Long Term Goals - 10/29/18 1040      PT  LONG TERM GOAL #1   Title  Patient will be independent with HEP    Time  6    Period  Weeks    Status  Achieved      PT LONG TERM GOAL #2   Title  Patient will demonstrate 4/5 left ankle MMT to improve stability during functional tasks.    Time  6    Period  Days    Status  On-going      PT LONG TERM GOAL #3   Title  Patient will demonstrate 8 degrees or greater of  left ankle DF AROM to improve gait mechanics.    Time  6    Period  Weeks    Status  Achieved      PT LONG TERM GOAL #4   Title  Patient will demonstrate ability to walk greater than 15 minutes with left ankle pain less than or equal to 3/10 to return to walking program for exercise.    Time  6    Period  Weeks    Status  Achieved            Plan - 11/14/18 1054    Clinical Impression Statement  Patient presented in clinic with only reports of short lived burning sensation in lateral aspect of L ankle. Patient guided through more advanced strengthening of the ankle as well as balance activities. Patient did report strain along lateral ankle from heel raises from strengthening. Normal vasopnuematic response noted following removal of the modality.    Stability/Clinical Decision Making  Stable/Uncomplicated    Rehab Potential  Excellent    PT Frequency  2x / week    PT Duration  6 weeks    PT Treatment/Interventions  ADLs/Self Care Home Management;Cryotherapy;Electrical Stimulation;Moist Heat;Ultrasound;Gait training;Stair training;Functional mobility training;Therapeutic activities;Therapeutic exercise;Balance training;Passive range of motion;Patient/family education;Neuromuscular re-education;Manual techniques;Vasopneumatic Device;Taping    PT Next Visit Plan  Continue ankle strengthening and add balance activities. FOTO    Consulted and Agree with Plan of Care  Patient       Patient will benefit from skilled therapeutic intervention in order to improve the following deficits and impairments:  Decreased activity  tolerance, Decreased strength, Decreased range of motion, Difficulty walking, Pain  Visit Diagnosis: Pain in left ankle and joints of left foot  Stiffness of left ankle, not elsewhere classified  Muscle weakness (generalized)  Unsteadiness on feet     Problem List Patient Active Problem List   Diagnosis Date Noted  . Chronic systolic  CHF (congestive heart failure) ([place]) 05/23/2015  . CAD S/P urgent PCI 05/16/15 05/19/2015  . Cardiomyopathy, ischemic 05/19/2015  . ST elevation (STEMI) myocardial infarction involving left anterior descending coronary artery ([place])   . Acute ST elevation myocardial infarction (STEMI) of anterolateral wall ([place]) 05/16/2015  . Encounter for therapeutic drug monitoring 07/19/2013  . 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 Aortic valve replaced 01/25/2011  . Long term current use of anticoagulant therapy 06/25/2010  . INFECTIOUS DIARRHEA 03/20/2008  . DIARRHEA-PRESUMED INFECTIOUS 03/20/2008  . URTICARIA 03/13/2008  . FATIGUE 09/12/2007  . DIARRHEA 08/29/2007  . ABDOMINAL PAIN, GENERALIZED, CHRONIC 08/29/2007  . Allergic rhinitis 05/09/2007  . HIATAL HERNIA 05/09/2007  . ARTHRITIS 05/09/2007  . OSTEOPENIA 05/09/2007  . GERD 01/23/2007  . CANCER, ENDOMETRIUM 07/25/2006  . Dyslipidemia 07/25/2006  . COMMON MIGRAINE 07/25/2006  . Essential hypertension 07/25/2006  . Hx Breast Cancer, IDC, Stage I 07/03/2003    Standley Brooking, PTA 11/14/2018, 11:03 AM  [facility] [address] [place], [place], [address] Phone: [phone]   Fax:  [phone]  Name: Amanda Wiggins MRN: [id number] Date of Birth: 1946/02/20

## 2018-11-16 ENCOUNTER — Ambulatory Visit: Payer: Medicare Other | Admitting: Physical Therapy

## 2018-11-16 ENCOUNTER — Other Ambulatory Visit: Payer: Self-pay

## 2018-11-16 ENCOUNTER — Encounter: Payer: Self-pay | Admitting: Physical Therapy

## 2018-11-16 DIAGNOSIS — M25672 Stiffness of left ankle, not elsewhere classified: Secondary | ICD-10-CM

## 2018-11-16 DIAGNOSIS — R2681 Unsteadiness on feet: Secondary | ICD-10-CM

## 2018-11-16 DIAGNOSIS — M25572 Pain in left ankle and joints of left foot: Secondary | ICD-10-CM

## 2018-11-16 DIAGNOSIS — M6281 Muscle weakness (generalized): Secondary | ICD-10-CM

## 2018-11-16 NOTE — Therapy (Signed)
[facility] [place], [place], [address] Phone: [phone]   Fax:  [phone]  Physical Therapy Treatment  Patient Details  Name: Amanda Wiggins MRN: [id number] Date of Birth: 08-Jan-1946 Referring Provider (PT): Marchia Bond, MD   Encounter Date: 11/16/2018  PT End of Session - 11/16/18 0949    Visit Number  11    Number of Visits  12    Date for PT Re-Evaluation  11/27/18    Authorization Type  FOTO; Progress note every 10th visit;    PT Start Time  0945    PT Stop Time  1034    PT Time Calculation (min)  49 min    Activity Tolerance  Patient tolerated treatment well    Behavior During Therapy  [facility] for tasks assessed/performed       Past Medical History:  Diagnosis Date  . Aortic valve replaced   . Breast cancer ([place]) 2005  . FHx: migraine headaches   . GERD (gastroesophageal reflux disease)   . Hiatal hernia   . Hx Breast Cancer, IDC, Stage I 07/03/2003  . Hx of breast cancer   . Hyperlipidemia   . Hypertension   . Menopausal syndrome   . Osteopenia   . 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 Personal history of radiation therapy   . Status post aortic valve repair     Past Surgical History:  Procedure Laterality Date  . AORTIC VALVE REPLACEMENT  2003  . BREAST BIOPSY    . BREAST LUMPECTOMY Left 2005  . CARDIAC CATHETERIZATION     Ejection Fraction   . CARDIAC CATHETERIZATION N/A 05/16/2015   Procedure: Left Heart Cath and Coronary Angiography;  Surgeon: Leonie Man, MD;  Location: [place] [facility];  Service: Cardiovascular;  Laterality: N/A;  . CARDIAC CATHETERIZATION  05/16/2015   Procedure: Coronary Stent Intervention;  Surgeon: Leonie Man, MD;  Location: [place] [facility];  Service: Cardiovascular;;  . CARDIAC CATHETERIZATION N/A 05/19/2015   Procedure: Left Heart Cath and Coronary Angiography;  Surgeon: Troy Sine, MD;  Location: [place] [facility];  Service:  Cardiovascular;  Laterality: N/A;  . CARDIAC CATHETERIZATION N/A 05/19/2015   Procedure: Coronary Stent Intervention;  Surgeon: Troy Sine, MD;  Location: [place] [facility];  Service: Cardiovascular;  Laterality: N/A;  . HIATAL HERNIA REPAIR    . HIATAL HERNIA REPAIR  05/11/2007  . hysterectomy, endometiral cancer  2001  . lummpectomy breast cancer  07/03/2003  . nissan fundoplicaiton  [id number]   post-op hematoma on heparin   . right shouler surgery      There were no vitals filed for this visit.  Subjective Assessment - 11/16/18 0946    Subjective  COVID 19 screening performed on patient upon arrival. No ankle pain but reports soreness in LEs from ascending and descending stairs to decorate her home.    Limitations  Standing;Walking;House hold activities    Diagnostic tests  x-ray: routine healing    Patient Stated Goals  improve walking time    Currently in Pain?  No/denies         [facility] PT Assessment - 11/16/18 0001      Assessment   Medical Diagnosis  Left anterior/distal fibula avulsion fracture, plantar fasciitis    Referring Provider (PT)  Marchia Bond, MD    Onset Date/Surgical Date  09/18/18    Next MD Visit  None    Prior Therapy  no  Precautions   Precautions  None      Restrictions   Weight Bearing Restrictions  No      Observation/Other Assessments   Focus on Therapeutic Outcomes (FOTO)   1% limited, CI status                   [facility] Adult PT Treatment/Exercise - 11/16/18 0001      Knee/Hip Exercises: Aerobic   Recumbent Bike  --      Modalities   Modalities  Vasopneumatic      Vasopneumatic   Number Minutes Vasopneumatic   15 minutes    Vasopnuematic Location   Ankle    Vasopneumatic Pressure  Medium    Vasopneumatic Temperature   34      Ankle Exercises: Aerobic   Recumbent Bike  L2, seat 2 x15 min      Ankle Exercises: Machines for Strengthening   Cybex Leg Press  2pl x30 reps      Ankle Exercises: Standing   Heel Raises   Both;20 reps   3D   Toe Raise  20 reps    Other Standing Ankle Exercises  B lunges x15 reps       Ankle Exercises: Supine   Other Supine Ankle Exercises  ABCs x1 rep                  PT Long Term Goals - 10/29/18 1040      PT LONG TERM GOAL #1   Title  Patient will be independent with HEP    Time  6    Period  Weeks    Status  Achieved      PT LONG TERM GOAL #2   Title  Patient will demonstrate 4/5 left ankle MMT to improve stability during functional tasks.    Time  6    Period  Days    Status  On-going      PT LONG TERM GOAL #3   Title  Patient will demonstrate 8 degrees or greater of  left ankle DF AROM to improve gait mechanics.    Time  6    Period  Weeks    Status  Achieved      PT LONG TERM GOAL #4   Title  Patient will demonstrate ability to walk greater than 15 minutes with left ankle pain less than or equal to 3/10 to return to walking program for exercise.    Time  6    Period  Weeks    Status  Achieved            Plan - 11/16/18 1021    Clinical Impression Statement  Patient presented in clinic with no complaints of any difficulty with L ankle pain or limitations. No functional limitations per patient report. Patient reports disturbance with balance at this time and being careful in her surroundings to avoid reinjury. Patient had no complaints during today's treatment. Normal vasopneumatic response noted following removal of the modalities. 1% limitation, CI with FOTO.    Stability/Clinical Decision Making  Stable/Uncomplicated    Rehab Potential  Excellent    PT Frequency  2x / week    PT Duration  6 weeks    PT Treatment/Interventions  ADLs/Self Care Home Management;Cryotherapy;Electrical Stimulation;Moist Heat;Ultrasound;Gait training;Stair training;Functional mobility training;Therapeutic activities;Therapeutic exercise;Balance training;Passive range of motion;Patient/family education;Neuromuscular re-education;Manual  techniques;Vasopneumatic Device;Taping    PT Next Visit Plan  Continue ankle strengthening and add balance activities. FOTO    Consulted and Agree with  Plan of Care  Patient       Patient will benefit from skilled therapeutic intervention in order to improve the following deficits and impairments:  Decreased activity tolerance, Decreased strength, Decreased range of motion, Difficulty walking, Pain  Visit Diagnosis: Pain in left ankle and joints of left foot  Stiffness of left ankle, not elsewhere classified  Muscle weakness (generalized)  Unsteadiness on feet     Problem List Patient Active Problem List   Diagnosis Date Noted  . Chronic systolic CHF (congestive heart failure) ([place]) 05/23/2015  . CAD S/P urgent PCI 05/16/15 05/19/2015  . Cardiomyopathy, ischemic 05/19/2015  . ST elevation (STEMI) myocardial infarction involving left anterior descending coronary artery ([place])   . Acute ST elevation myocardial infarction (STEMI) of anterolateral wall ([place]) 05/16/2015  . Encounter for therapeutic drug monitoring 07/19/2013  . 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 Aortic valve replaced 01/25/2011  . Long term current use of anticoagulant therapy 06/25/2010  . INFECTIOUS DIARRHEA 03/20/2008  . DIARRHEA-PRESUMED INFECTIOUS 03/20/2008  . URTICARIA 03/13/2008  . FATIGUE 09/12/2007  . DIARRHEA 08/29/2007  . ABDOMINAL PAIN, GENERALIZED, CHRONIC 08/29/2007  . Allergic rhinitis 05/09/2007  . HIATAL HERNIA 05/09/2007  . ARTHRITIS 05/09/2007  . OSTEOPENIA 05/09/2007  . GERD 01/23/2007  . CANCER, ENDOMETRIUM 07/25/2006  . Dyslipidemia 07/25/2006  . COMMON MIGRAINE 07/25/2006  . Essential hypertension 07/25/2006  . Hx Breast Cancer, IDC, Stage I 07/03/2003    Standley Brooking, PTA 11/16/2018, 10:41 AM  [facility] [address] [place], [place], [address] Phone: [phone]   Fax:  [phone]  Name: LEAFY SANON MRN: [id number] Date of Birth: 13-May-1945

## 2018-11-18 ENCOUNTER — Other Ambulatory Visit: Payer: Self-pay | Admitting: Hematology & Oncology

## 2018-11-18 DIAGNOSIS — Z853 Personal history of malignant neoplasm of breast: Secondary | ICD-10-CM

## 2018-11-20 ENCOUNTER — Encounter: Payer: Medicare Other | Admitting: *Deleted

## 2018-11-21 ENCOUNTER — Encounter: Payer: Self-pay | Admitting: Physical Therapy

## 2018-11-21 ENCOUNTER — Ambulatory Visit: Payer: Medicare Other | Admitting: Physical Therapy

## 2018-11-21 ENCOUNTER — Other Ambulatory Visit: Payer: Self-pay

## 2018-11-21 DIAGNOSIS — M6281 Muscle weakness (generalized): Secondary | ICD-10-CM

## 2018-11-21 DIAGNOSIS — M25572 Pain in left ankle and joints of left foot: Secondary | ICD-10-CM

## 2018-11-21 DIAGNOSIS — M25672 Stiffness of left ankle, not elsewhere classified: Secondary | ICD-10-CM

## 2018-11-21 DIAGNOSIS — R2681 Unsteadiness on feet: Secondary | ICD-10-CM

## 2018-11-21 NOTE — Therapy (Signed)
[facility] [place], [place], [address] Phone: [phone]   Fax:  [phone]  Physical Therapy Treatment PHYSICAL THERAPY DISCHARGE SUMMARY  Visits from Start of Care: 12  Current functional level related to goals / functional outcomes: See below    Remaining deficits: All goals met   Education / Equipment: HEP Plan: Patient agrees to discharge.  Patient goals were met. Patient is being discharged due to meeting the stated rehab goals.  ?????  Gabriela Eves, PT, DPT 11/21/18    Patient Details  Name: Amanda Wiggins MRN: [id number] Date of Birth: Feb 11, 1946 Referring Provider (PT): Marchia Bond, MD   Encounter Date: 11/21/2018  PT End of Session - 11/21/18 0952    Visit Number  12    Number of Visits  12    Date for PT Re-Evaluation  11/27/18    Authorization Type  FOTO; Progress note every 10th visit;    PT Start Time  0949    PT Stop Time  1034    PT Time Calculation (min)  45 min    Activity Tolerance  Patient tolerated treatment well    Behavior During Therapy  [facility] for tasks assessed/performed       Past Medical History:  Diagnosis Date  . Aortic valve replaced   . Breast cancer ([place]) 2005  . FHx: migraine headaches   . GERD (gastroesophageal reflux disease)   . Hiatal hernia   . Hx Breast Cancer, IDC, Stage I 07/03/2003  . Hx of breast cancer   . Hyperlipidemia   . Hypertension   . Menopausal syndrome   . Osteopenia   . 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 Personal history of radiation therapy   . Status post aortic valve repair     Past Surgical History:  Procedure Laterality Date  . AORTIC VALVE REPLACEMENT  2003  . BREAST BIOPSY    . BREAST LUMPECTOMY Left 2005  . CARDIAC CATHETERIZATION     Ejection Fraction   . CARDIAC CATHETERIZATION N/A 05/16/2015   Procedure: Left Heart Cath and Coronary Angiography;  Surgeon: Leonie Man, MD;  Location: [place] [facility]  [facility];  Service: Cardiovascular;  Laterality: N/A;  . CARDIAC CATHETERIZATION  05/16/2015   Procedure: Coronary Stent Intervention;  Surgeon: Leonie Man, MD;  Location: [place] [facility];  Service: Cardiovascular;;  . CARDIAC CATHETERIZATION N/A 05/19/2015   Procedure: Left Heart Cath and Coronary Angiography;  Surgeon: Troy Sine, MD;  Location: [place] [facility];  Service: Cardiovascular;  Laterality: N/A;  . CARDIAC CATHETERIZATION N/A 05/19/2015   Procedure: Coronary Stent Intervention;  Surgeon: Troy Sine, MD;  Location: [place] [facility];  Service: Cardiovascular;  Laterality: N/A;  . HIATAL HERNIA REPAIR    . HIATAL HERNIA REPAIR  05/11/2007  . hysterectomy, endometiral cancer  2001  . lummpectomy breast cancer  07/03/2003  . nissan fundoplicaiton  [id number]   post-op hematoma on heparin   . right shouler surgery      There were no vitals filed for this visit.  Subjective Assessment - 11/21/18 0950    Subjective  COVID 19 screening performed on patient upon arrival. Patient reports trying to wear shoes similar to Tom's but felt very uncomfortable due to lack of support.    Limitations  Standing;Walking;House hold activities    Diagnostic tests  x-ray: routine healing    Patient Stated Goals  improve walking time    Currently in Pain?  No/denies         [facility] PT Assessment - 11/21/18 0001      Assessment   Medical Diagnosis  Left anterior/distal fibula avulsion fracture, plantar fasciitis    Referring Provider (PT)  Marchia Bond, MD    Onset Date/Surgical Date  09/18/18    Next MD Visit  None    Prior Therapy  no      Precautions   Precautions  None      Restrictions   Weight Bearing Restrictions  No                   [facility] Adult PT Treatment/Exercise - 11/21/18 0001      Exercises   Exercises  Ankle;Knee/Hip      Knee/Hip Exercises: Standing   Heel Raises  --    Heel Raises Limitations  --      Modalities   Modalities  Vasopneumatic       Vasopneumatic   Number Minutes Vasopneumatic   15 minutes    Vasopnuematic Location   Ankle    Vasopneumatic Pressure  Medium    Vasopneumatic Temperature   34      Ankle Exercises: Aerobic   Recumbent Bike  L4 x15 min      Ankle Exercises: Machines for Strengthening   Cybex Leg Press  2pl x30 reps      Ankle Exercises: Standing   Heel Raises  Both;20 reps   off 2" step   Toe Raise  20 reps      Ankle Exercises: Seated   ABC's  1 rep   1# ankle isolator   Towel Inversion/Eversion  Other (comment)   1# ankle isolator x20 reps                 PT Long Term Goals - 11/21/18 1042      PT LONG TERM GOAL #1   Title  Patient will be independent with HEP    Time  6    Period  Weeks    Status  Achieved      PT LONG TERM GOAL #2   Title  Patient will demonstrate 4/5 left ankle MMT to improve stability during functional tasks.    Time  6    Period  Days    Status  Achieved      PT LONG TERM GOAL #3   Title  Patient will demonstrate 8 degrees or greater of  left ankle DF AROM to improve gait mechanics.    Time  6    Period  Weeks    Status  Achieved      PT LONG TERM GOAL #4   Title  Patient will demonstrate ability to walk greater than 15 minutes with left ankle pain less than or equal to 3/10 to return to walking program for exercise.    Time  6    Period  Weeks    Status  Achieved            Plan - 11/21/18 1106    Clinical Impression Statement  Patient has progressed well in PT following L ankle fracture. Patient able to complete all exercises and denies any functional limitations. Patient conscious of her shoe selection as she still feels safer with tennis shoes. Patient able to achieve all LTGs related to L anke including L ankle MMT which was assessed in today's visit. No abnormal edema present in L ankle today. Normal vasopneumatic response noted following removal of the  modalities.    Stability/Clinical Decision Making  Stable/Uncomplicated     Rehab Potential  Excellent    PT Frequency  2x / week    PT Duration  6 weeks    PT Treatment/Interventions  ADLs/Self Care Home Management;Cryotherapy;Electrical Stimulation;Moist Heat;Ultrasound;Gait training;Stair training;Functional mobility training;Therapeutic activities;Therapeutic exercise;Balance training;Passive range of motion;Patient/family education;Neuromuscular re-education;Manual techniques;Vasopneumatic Device;Taping    PT Next Visit Plan  D/C summary required.    Consulted and Agree with Plan of Care  Patient       Patient will benefit from skilled therapeutic intervention in order to improve the following deficits and impairments:  Decreased activity tolerance, Decreased strength, Decreased range of motion, Difficulty walking, Pain  Visit Diagnosis: Pain in left ankle and joints of left foot  Stiffness of left ankle, not elsewhere classified  Muscle weakness (generalized)  Unsteadiness on feet     Problem List Patient Active Problem List   Diagnosis Date Noted  . Chronic systolic CHF (congestive heart failure) ([place]) 05/23/2015  . CAD S/P urgent PCI 05/16/15 05/19/2015  . Cardiomyopathy, ischemic 05/19/2015  . ST elevation (STEMI) myocardial infarction involving left anterior descending coronary artery ([place])   . Acute ST elevation myocardial infarction (STEMI) of anterolateral wall ([place]) 05/16/2015  . Encounter for therapeutic drug monitoring 07/19/2013  . 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 Aortic valve replaced 01/25/2011  . Long term current use of anticoagulant therapy 06/25/2010  . INFECTIOUS DIARRHEA 03/20/2008  . DIARRHEA-PRESUMED INFECTIOUS 03/20/2008  . URTICARIA 03/13/2008  . FATIGUE 09/12/2007  . DIARRHEA 08/29/2007  . ABDOMINAL PAIN, GENERALIZED, CHRONIC 08/29/2007  . Allergic rhinitis 05/09/2007  . HIATAL HERNIA 05/09/2007  . ARTHRITIS 05/09/2007  . OSTEOPENIA 05/09/2007  . GERD 01/23/2007  . CANCER, ENDOMETRIUM 07/25/2006   . Dyslipidemia 07/25/2006  . COMMON MIGRAINE 07/25/2006  . Essential hypertension 07/25/2006  . Hx Breast Cancer, IDC, Stage I 07/03/2003   Standley Brooking, PTA 11/21/18 11:12 AM   [facility] [place], [place], [address] Phone: [phone]   Fax:  [phone]  Name: ZANAE KUEHNLE MRN: [id number] Date of Birth: 1945/11/20

## 2018-12-06 ENCOUNTER — Other Ambulatory Visit: Payer: Self-pay

## 2018-12-06 ENCOUNTER — Ambulatory Visit (INDEPENDENT_AMBULATORY_CARE_PROVIDER_SITE_OTHER): Payer: Medicare Other | Admitting: *Deleted

## 2018-12-06 DIAGNOSIS — Z952 Presence of prosthetic heart valve: Secondary | ICD-10-CM | POA: Diagnosis not present

## 2018-12-06 DIAGNOSIS — Z5181 Encounter for therapeutic drug level monitoring: Secondary | ICD-10-CM | POA: Diagnosis not present

## 2018-12-06 LAB — POCT INR: INR: 2.8 (ref 2.0–3.0)

## 2018-12-06 NOTE — Patient Instructions (Signed)
Description   Continue taking 1/2 tablet daily except 1 tablet on Tuesdays, Thursdays, and Saturdays. Recheck in 8 weeks. [facility] [phone]

## 2019-01-10 ENCOUNTER — Other Ambulatory Visit: Payer: Self-pay | Admitting: Cardiovascular Disease

## 2019-01-14 ENCOUNTER — Other Ambulatory Visit: Payer: Self-pay | Admitting: Cardiovascular Disease

## 2019-01-30 ENCOUNTER — Ambulatory Visit (INDEPENDENT_AMBULATORY_CARE_PROVIDER_SITE_OTHER): Payer: Medicare Other | Admitting: *Deleted

## 2019-01-30 ENCOUNTER — Other Ambulatory Visit: Payer: Self-pay

## 2019-01-30 DIAGNOSIS — Z5181 Encounter for therapeutic drug level monitoring: Secondary | ICD-10-CM | POA: Diagnosis not present

## 2019-01-30 DIAGNOSIS — Z952 Presence of prosthetic heart valve: Secondary | ICD-10-CM

## 2019-01-30 LAB — POCT INR: INR: 2.8 (ref 2.0–3.0)

## 2019-01-30 NOTE — Patient Instructions (Signed)
Description   Continue taking 1/2 tablet daily except 1 tablet on Tuesdays, Thursdays, and Saturdays. Recheck in 8 weeks. [facility] [phone]

## 2019-02-14 ENCOUNTER — Other Ambulatory Visit: Payer: Self-pay | Admitting: Hematology & Oncology

## 2019-02-14 DIAGNOSIS — Z853 Personal history of malignant neoplasm of breast: Secondary | ICD-10-CM

## 2019-02-15 ENCOUNTER — Other Ambulatory Visit: Payer: Self-pay | Admitting: *Deleted

## 2019-02-15 DIAGNOSIS — Z853 Personal history of malignant neoplasm of breast: Secondary | ICD-10-CM

## 2019-03-04 ENCOUNTER — Telehealth: Payer: Self-pay

## 2019-03-04 NOTE — Telephone Encounter (Signed)
Called the pt and asked for their new insurance information that will start after January 1st is as follows: [id number]  The pt will need a new pa

## 2019-03-12 ENCOUNTER — Emergency Department (HOSPITAL_COMMUNITY)
Admission: EM | Admit: 2019-03-12 | Discharge: 2019-03-13 | Disposition: A | Discharge status: Home / Self Care | Payer: Medicare PPO | Attending: Emergency Medicine | Admitting: Emergency Medicine

## 2019-03-12 ENCOUNTER — Other Ambulatory Visit: Payer: Self-pay

## 2019-03-12 ENCOUNTER — Ambulatory Visit: Payer: Self-pay | Admitting: *Deleted

## 2019-03-12 ENCOUNTER — Telehealth: Payer: Self-pay | Admitting: Cardiovascular Disease

## 2019-03-12 ENCOUNTER — Emergency Department (HOSPITAL_COMMUNITY): Payer: Medicare PPO

## 2019-03-12 DIAGNOSIS — I252 Old myocardial infarction: Secondary | ICD-10-CM | POA: Diagnosis not present

## 2019-03-12 DIAGNOSIS — I110 Hypertensive heart disease with heart failure: Secondary | ICD-10-CM | POA: Insufficient documentation

## 2019-03-12 DIAGNOSIS — Z91040 Latex allergy status: Secondary | ICD-10-CM | POA: Insufficient documentation

## 2019-03-12 DIAGNOSIS — R0789 Other chest pain: Secondary | ICD-10-CM | POA: Insufficient documentation

## 2019-03-12 DIAGNOSIS — I5022 Chronic systolic (congestive) heart failure: Secondary | ICD-10-CM | POA: Diagnosis not present

## 2019-03-12 DIAGNOSIS — Z853 Personal history of malignant neoplasm of breast: Secondary | ICD-10-CM | POA: Diagnosis not present

## 2019-03-12 DIAGNOSIS — Z79899 Other long term (current) drug therapy: Secondary | ICD-10-CM | POA: Diagnosis not present

## 2019-03-12 DIAGNOSIS — Z952 Presence of prosthetic heart valve: Secondary | ICD-10-CM | POA: Insufficient documentation

## 2019-03-12 DIAGNOSIS — Z7901 Long term (current) use of anticoagulants: Secondary | ICD-10-CM | POA: Diagnosis not present

## 2019-03-12 DIAGNOSIS — R079 Chest pain, unspecified: Secondary | ICD-10-CM

## 2019-03-12 LAB — CBC
HCT: 47.9 % — ABNORMAL HIGH (ref 36.0–46.0)
Hemoglobin: 15 g/dL (ref 12.0–15.0)
MCH: 28.4 pg (ref 26.0–34.0)
MCHC: 31.3 g/dL (ref 30.0–36.0)
MCV: 90.5 fL (ref 80.0–100.0)
Platelets: 353 10*3/uL (ref 150–400)
RBC: 5.29 MIL/uL — ABNORMAL HIGH (ref 3.87–5.11)
RDW: 12.8 % (ref 11.5–15.5)
WBC: 7.6 10*3/uL (ref 4.0–10.5)
nRBC: 0 % (ref 0.0–0.2)

## 2019-03-12 LAB — BASIC METABOLIC PANEL
Anion gap: 10 (ref 5–15)
BUN: 22 mg/dL (ref 8–23)
CO2: 28 mmol/L (ref 22–32)
Calcium: 9.5 mg/dL (ref 8.9–10.3)
Chloride: 102 mmol/L (ref 98–111)
Creatinine, Ser: 0.98 mg/dL (ref 0.44–1.00)
GFR calc Af Amer: >60 mL/min (ref >60)
GFR calc non Af Amer: 58 mL/min — ABNORMAL LOW (ref >60)
Glucose, Bld: 113 mg/dL — ABNORMAL HIGH (ref 70–99)
Potassium: 4.8 mmol/L (ref 3.5–5.1)
Sodium: 140 mmol/L (ref 135–145)

## 2019-03-12 LAB — PROTIME-INR
INR: 1.5 — ABNORMAL HIGH (ref 0.8–1.2)
Prothrombin Time: 18.3 seconds — ABNORMAL HIGH (ref 11.4–15.2)

## 2019-03-12 LAB — TROPONIN I (HIGH SENSITIVITY): Troponin I (High Sensitivity): 12 ng/L (ref <18)

## 2019-03-12 NOTE — Telephone Encounter (Signed)
Will monitor for ED arrival.  

## 2019-03-12 NOTE — Telephone Encounter (Signed)
Pt called with having chest pain for over s week. Stating that it goes under her breast, and chest cavity. She has a hx of having 2 heart attacks about 4 years ago. She took a NTG and it did help some. She is also on blood thinners. She denies shortness of breath, nausea, dizziness, weakness, cough. No increase with pain with activity. She has a hx of having a hiatal hernia and surgery for that. She thinks the pain is GI. She would like to make an appointment but per protocol, she should be seen in the ED. Pt refuses to go because of covid. Routing to [facility] at [facility] for review. Pt would like a call back from her pcp.      Reason for Disposition . [1] Chest pain (or "angina") comes and goes AND [2] is happening more often (increasing in frequency) or getting worse (increasing in severity)  Answer Assessment - Initial Assessment Questions 1. LOCATION: "Where does it hurt?"       chest 2. RADIATION: "Does the pain go anywhere else?" (e.g., into neck, jaw, arms, back)     Yes under her breast, abd and chest cavity 3. ONSET: "When did the chest pain begin?" (Minutes, hours or days)      Over a week ago 4. PATTERN "Does the pain come and go, or has it been constant since it started?"  "Does it get worse with exertion?"      Comes and goes 5. DURATION: "How long does it last" (e.g., seconds, minutes, hours)     Not sure 6. SEVERITY: "How bad is the pain?"  (e.g., Scale 1-10; mild, moderate, or severe)    - MILD (1-3): doesn't interfere with normal activities     - MODERATE (4-7): interferes with normal activities or awakens from sleep    - SEVERE (8-10): excruciating pain, unable to do any normal activities       Pain # 3 or 4 7. CARDIAC RISK FACTORS: "Do you have any history of heart problems or risk factors for heart disease?" (e.g., angina, prior heart attack; diabetes, high blood pressure, high cholesterol, smoker, or strong family history of heart disease)     Heart attacks, high  blood pressure, high cholesterol, strong family history of heart disease.  8. PULMONARY RISK FACTORS: "Do you have any history of lung disease?"  (e.g., blood clots in lung, asthma, emphysema, birth control pills)     no 9. CAUSE: "What do you think is causing the chest pain?"     Possible GI. 10. OTHER SYMPTOMS: "Do you have any other symptoms?" (e.g., dizziness, nausea, vomiting, sweating, fever, difficulty breathing, cough)       no 11. PREGNANCY: "Is there any chance you are pregnant?" "When was your last menstrual period?"       n/a  Protocols used: CHEST PAIN-A-AH

## 2019-03-12 NOTE — Telephone Encounter (Signed)
New Message  Patient is calling in requesting to speak with Dr. Elmarie Shiley nurse. States that she has a question for her, patient would not state about what. Please give patient a call back to assist.

## 2019-03-12 NOTE — Telephone Encounter (Signed)
I spoke to the patient and recommended that she follows [facility]'s advice that she goes to the ED for further evaluation due to her CP.  She verbalized understanding and will make her way there.

## 2019-03-12 NOTE — ED Triage Notes (Signed)
Pt here for evaluation of ten days of chest pain under breasts, radiating at times to L shoulder blade and to L arm. Hx MI x 2 in 2017 with stent placement, also has artifical aortic valve. Takes Plavix and Coumadin.

## 2019-03-13 ENCOUNTER — Telehealth: Payer: Self-pay | Admitting: Cardiovascular Disease

## 2019-03-13 ENCOUNTER — Ambulatory Visit (INDEPENDENT_AMBULATORY_CARE_PROVIDER_SITE_OTHER): Payer: Medicare PPO | Admitting: Internal Medicine

## 2019-03-13 DIAGNOSIS — Z5181 Encounter for therapeutic drug level monitoring: Secondary | ICD-10-CM | POA: Diagnosis not present

## 2019-03-13 LAB — TROPONIN I (HIGH SENSITIVITY): Troponin I (High Sensitivity): 12 ng/L (ref <18)

## 2019-03-13 NOTE — Patient Instructions (Addendum)
Description   Spoke to pt and instructed her to take 1 tablet today and 1.5 tablets tomorrow, then continue taking 1/2 tablet daily except 1 tablet on Tuesdays, Thursdays, and Saturdays. Recheck in 1 week. [facility] [phone]

## 2019-03-13 NOTE — Telephone Encounter (Signed)
Patient is calling because she went to the ER yesterday for chest pain. It was recommended that she follow-up with her cardiologist so she is calling to schedule an appointment. Patient would not like to see an APP and would prefer to see Dr. Acie Fredrickson. She has an appointment with Dr. Acie Fredrickson on 05/06/19 and is okay to wait until then but wanted to check and see if she should be seen sooner based on tests from the ER. She would like Sharyn Lull to call her back when she gets back in the office.

## 2019-03-13 NOTE — Discharge Instructions (Addendum)
Please follow-up with your cardiologist.    No emergent cause of your chest pain was found tonight.  We find you safe for outpatient follow-up.  Please return if your symptoms change or worsen.

## 2019-03-13 NOTE — Telephone Encounter (Signed)
New Message    Pt is calling to schedule a Hospital F/U She does not want to see a PA and only wants to see Dr Acie Fredrickson She would like for Dr Lanny Hurst nurse to call her    Please call

## 2019-03-13 NOTE — Telephone Encounter (Signed)
I spoke to the patient and scheduled an appt with Dr Acie Fredrickson on 1/8 @ 1:40 pm.  She has been experiencing CP and had a ED visit on 1/5 and was told to f/u with Dr Acie Fredrickson.

## 2019-03-13 NOTE — ED Notes (Signed)
Discharge instructions reviewed with pt. Pt verbalized understanding.   

## 2019-03-13 NOTE — Telephone Encounter (Signed)
Pt was admitted to ED. PT was discharged and advised to f/u with cardio.

## 2019-03-13 NOTE — ED Provider Notes (Signed)
[facility] EMERGENCY DEPARTMENT Provider Note   CSN: [id number] Arrival date & time: 03/12/19  1607     History Chief Complaint  Patient presents with  . Chest Pain    Amanda Wiggins is a 74 y.o. female.  Patient with past medical history of mechanical aortic valve replacement, on Coumadin, prior MI, CHF, hypertension, presents to the emergency department with a chief complaint of chest pain.  She states that she has had chest pain that wraps underneath both of her breasts for the past 1 week.  She denies any shortness of breath.  She states that the pain is worsened with certain movements.  She denies any fever, chills, cough.  She states that this feels different from her prior heart attack.  However, she states that she does have some pain under her left shoulder blade which she had with her prior MI. No hx of PE or DVT.  No known covid exposures. She called her doctor and was referred to the ED for evaluation.  The history is provided by the patient. No language interpreter was used.       Past Medical History:  Diagnosis Date  . Aortic valve replaced   . Breast cancer ([place]) 2005  . FHx: migraine headaches   . GERD (gastroesophageal reflux disease)   . Hiatal hernia   . Hx Breast Cancer, IDC, Stage I 07/03/2003  . Hx of breast cancer   . Hyperlipidemia   . Hypertension   . Menopausal syndrome   . Osteopenia   . 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 Personal history of radiation therapy   . Status post aortic valve repair     Patient Active Problem List   Diagnosis Date Noted  . Chronic systolic CHF (congestive heart failure) ([place]) 05/23/2015  . CAD S/P urgent PCI 05/16/15 05/19/2015  . Cardiomyopathy, ischemic 05/19/2015  . ST elevation (STEMI) myocardial infarction involving left anterior descending coronary artery ([place])   . Acute ST elevation myocardial infarction (STEMI) of anterolateral wall ([place]) 05/16/2015  . Encounter for  therapeutic drug monitoring 07/19/2013  . 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 Aortic valve replaced 01/25/2011  . Long term current use of anticoagulant therapy 06/25/2010  . INFECTIOUS DIARRHEA 03/20/2008  . DIARRHEA-PRESUMED INFECTIOUS 03/20/2008  . URTICARIA 03/13/2008  . FATIGUE 09/12/2007  . DIARRHEA 08/29/2007  . ABDOMINAL PAIN, GENERALIZED, CHRONIC 08/29/2007  . Allergic rhinitis 05/09/2007  . HIATAL HERNIA 05/09/2007  . ARTHRITIS 05/09/2007  . OSTEOPENIA 05/09/2007  . GERD 01/23/2007  . CANCER, ENDOMETRIUM 07/25/2006  . Dyslipidemia 07/25/2006  . COMMON MIGRAINE 07/25/2006  . Essential hypertension 07/25/2006  . Hx Breast Cancer, IDC, Stage I 07/03/2003    Past Surgical History:  Procedure Laterality Date  . AORTIC VALVE REPLACEMENT  2003  . BREAST BIOPSY    . BREAST LUMPECTOMY Left 2005  . CARDIAC CATHETERIZATION     Ejection Fraction   . CARDIAC CATHETERIZATION N/A 05/16/2015   Procedure: Left Heart Cath and Coronary Angiography;  Surgeon: Leonie Man, MD;  Location: [facility];  Service: Cardiovascular;  Laterality: N/A;  . CARDIAC CATHETERIZATION  05/16/2015   Procedure: Coronary Stent Intervention;  Surgeon: Leonie Man, MD;  Location: [facility];  Service: Cardiovascular;;  . CARDIAC CATHETERIZATION N/A 05/19/2015   Procedure: Left Heart Cath and Coronary Angiography;  Surgeon: Troy Sine, MD;  Location: [facility];  Service: Cardiovascular;  Laterality: N/A;  . CARDIAC  CATHETERIZATION N/A 05/19/2015   Procedure: Coronary Stent Intervention;  Surgeon: Troy Sine, MD;  Location: [facility];  Service: Cardiovascular;  Laterality: N/A;  . HIATAL HERNIA REPAIR    . HIATAL HERNIA REPAIR  05/11/2007  . hysterectomy, endometiral cancer  2001  . lummpectomy breast cancer  07/03/2003  . nissan fundoplicaiton  [id number]   post-op hematoma on heparin   . right shouler surgery       OB History   No obstetric  history on file.     Family History  Problem Relation Age of Onset  . Heart attack Mother 73  . Osteoarthritis Brother        hpercholesterolemia    Social History   Tobacco Use  . Smoking status: Never Smoker  . Smokeless tobacco: Never Used  Substance Use Topics  . Alcohol use: Yes    Alcohol/week: 0.0 standard drinks    Comment: socially  . Drug use: No    Home Medications Prior to Admission medications   Medication Sig Start Date End Date Taking? Authorizing Provider  Alirocumab (PRALUENT) 75 MG/ML SOAJ Inject 1 pen into the skin every 14 (fourteen) days. 04/24/18   Nahser, Wonda Cheng, MD  amoxicillin (AMOXIL) 500 MG capsule TAKE 4 CAPSULES BY MOUTH ONE HOUR PRIOR TO APPOINTMENT 04/03/18   [provider]  BIOTIN PO Take 1 tablet by mouth every morning.     [provider]  Calcium Carbonate-Vitamin D (CALCIUM 600+D) 600-400 MG-UNIT per tablet Take 1 tablet by mouth 2 (two) times daily.      [provider]  carvedilol (COREG) 3.125 MG tablet TAKE 1 TABLET (3.125 MG TOTAL) BY MOUTH 2 (TWO) TIMES DAILY WITH A MEAL. 01/14/19   Nahser, Wonda Cheng, MD  clopidogrel (PLAVIX) 75 MG tablet TAKE 1 TABLET (75 MG TOTAL) BY MOUTH DAILY WITH BREAKFAST. 01/10/19   Nahser, Wonda Cheng, MD  cycloSPORINE (RESTASIS) 0.05 % ophthalmic emulsion Place 1 drop into both eyes 2 (two) times daily as needed (DRY EYE).     [provider]  Desoximetasone 0.05 % GEL Apply 1 application topically as needed (AS NEEDED TO AFFECTED AREA).  08/12/15   [provider]  Glucosamine-Chondroit-Vit C-Mn (GLUCOSAMINE CHONDR 1500 COMPLX) CAPS Take 1 capsule by mouth every morning.     [provider]  loratadine (CLARITIN) 10 MG tablet Take 10 mg by mouth daily.      [provider]  Multiple Vitamin (MULTIVITAMIN) capsule Take 1 capsule by mouth daily.      [provider]  nitroGLYCERIN (NITROSTAT) 0.4 MG SL tablet Place 1 tablet (0.4 mg total) under the  tongue every 5 (five) minutes as needed for chest pain. 10/30/17   Nahser, Wonda Cheng, MD  pantoprazole (PROTONIX) 40 MG tablet TAKE 1 TABLET BY MOUTH EVERY DAY 01/10/19   Nahser, Wonda Cheng, MD  PAZEO 0.7 % SOLN INSTILL 1 DROP INTO BOTH EYES DAILY AS NEEDED FOR ALLERGIES 02/04/17   [provider]  temazepam (RESTORIL) 30 MG capsule TAKE 1 CAPSULE (30 MG TOTAL) BY MOUTH AT BEDTIME AS NEEDED FOR SLEEP. 02/15/19   Volanda Napoleon, MD  warfarin (COUMADIN) 5 MG tablet TAKE AS DIRECTED BY COUMADIN CLINIC 08/15/18   Nahser, Wonda Cheng, MD    Allergies    Latex, Meperidine, Sulfa antibiotics, Crestor [rosuvastatin calcium], Pravastatin, and Sulfamethoxazole  Review of Systems   Review of Systems  All other systems reviewed and are negative.   Physical Exam Updated Vital  Signs BP (!) 165/79   Pulse 70   Temp 98.3 F (36.8 C) (Oral)   Resp 14   SpO2 99%   Physical Exam Vitals and nursing note reviewed.  Constitutional:      General: She is not in acute distress.    Appearance: She is well-developed.  HENT:     Head: Normocephalic and atraumatic.  Eyes:     Conjunctiva/sclera: Conjunctivae normal.  Cardiovascular:     Rate and Rhythm: Normal rate and regular rhythm.     Heart sounds: No murmur.  Pulmonary:     Effort: Pulmonary effort is normal. No respiratory distress.     Breath sounds: Normal breath sounds.  Abdominal:     Palpations: Abdomen is soft.     Tenderness: There is no abdominal tenderness.  Musculoskeletal:        General: Normal range of motion.     Cervical back: Neck supple.  Skin:    General: Skin is warm and dry.  Neurological:     Mental Status: She is alert and oriented to person, place, and time.  Psychiatric:        Mood and Affect: Mood normal.        Behavior: Behavior normal.     ED Results / Procedures / Treatments   Labs (all labs ordered are listed, but only abnormal results are displayed) Labs Reviewed  BASIC METABOLIC PANEL - Abnormal;  Notable for the following components:      Result Value   Glucose, Bld 113 (*)    GFR calc non Af Amer 58 (*)    All other components within normal limits  CBC - Abnormal; Notable for the following components:   RBC 5.29 (*)    HCT 47.9 (*)    All other components within normal limits  PROTIME-INR - Abnormal; Notable for the following components:   Prothrombin Time 18.3 (*)    INR 1.5 (*)    All other components within normal limits  TROPONIN I (HIGH SENSITIVITY)  TROPONIN I (HIGH SENSITIVITY)    EKG EKG Interpretation  Date/Time:  Tuesday March 12 2019 16:28:41 EST Ventricular Rate:  71 PR Interval:  168 QRS Duration: 122 QT Interval:  414 QTC Calculation: 449 R Axis:   180 Text Interpretation: Normal sinus rhythm Right bundle branch block Septal infarct , age undetermined Abnormal ECG When compared with ECG of 07/20/2015, Right bundle branch block is now present Premature ventricular complexes are no longer present ST elevation anterior leads is no longer present Confirmed by Delora Fuel ([id number]) on 03/12/2019 11:02:18 PM   Radiology DG Chest 2 View  Result Date: 03/12/2019 CLINICAL DATA:  Chest pain EXAM: CHEST - 2 VIEW COMPARISON:  05/03/2007 FINDINGS: The patient is status post prior median sternotomy. The heart size is enlarged and has increased in size since 2009. There is no pneumothorax. No large pleural effusion. There is no acute osseous abnormality. IMPRESSION: 1. Cardiomegaly without edema or large pleural effusion. 2. The patient is status post prior median sternotomy. Electronically Signed   By: Constance Holster M.D.   On: 03/12/2019 17:15    Procedures Procedures (including critical care time)  Medications Ordered in ED Medications - No data to display  ED Course  I have reviewed the triage vital signs and the nursing notes.  Pertinent labs & imaging results that were available during my care of the patient were reviewed by me and considered in my medical  decision making (see chart for details).  MDM Rules/Calculators/A&P                      Patient with chest pain x1 week.  It wraps beneath both of her breasts.  No associated shortness of breath, or radiating pain to her arm.  No diaphoresis.  She states that this does not feel like her prior MI.  Laboratory work-up is reassuring.  Troponins are 12x2, no significant elevation or significant change.  EKG is without acute ischemic changes or dysrhythmias.  Electrolytes are normal.  PT/INR is noted at 1.5, mildly subtherapeutic, patient will follow up with her cardiologist and Coumadin clinic for this.  I doubt PE.  She is not tachycardic or hypoxic.  Chest x-ray shows no evidence of infection.  Patient symptoms do not seem consistent with ACS.  Given the length of time that the symptoms have been present, reassuring laboratory work-up, EKG, chest x-ray, I feel the patient can be safely discharged from the emergency department without additional emergent work-up.  Patient has close follow-up with cardiology.  Patient seen by discussed with Dr. Leonides Schanz, who agrees with discharge plan.  Patient is stable and ready for discharge.    Final Clinical Impression(s) / ED Diagnoses Final diagnoses:  Nonspecific chest pain    Rx / DC Orders ED Discharge Orders    None       Montine Circle, PA-C 03/13/19 Hamblen, Delice Bison, DO 03/13/19 [phone]

## 2019-03-15 ENCOUNTER — Other Ambulatory Visit: Payer: Self-pay

## 2019-03-15 ENCOUNTER — Encounter: Payer: Self-pay | Admitting: Cardiovascular Disease

## 2019-03-15 ENCOUNTER — Ambulatory Visit: Payer: Medicare PPO | Admitting: Cardiovascular Disease

## 2019-03-15 VITALS — BP 124/62 | HR 71 | Ht 61.0 in | Wt 138.0 lb

## 2019-03-15 DIAGNOSIS — Z5181 Encounter for therapeutic drug level monitoring: Secondary | ICD-10-CM | POA: Diagnosis not present

## 2019-03-15 DIAGNOSIS — I2510 Atherosclerotic heart disease of native coronary artery without angina pectoris: Secondary | ICD-10-CM

## 2019-03-15 DIAGNOSIS — Z952 Presence of prosthetic heart valve: Secondary | ICD-10-CM

## 2019-03-15 DIAGNOSIS — I10 Essential (primary) hypertension: Secondary | ICD-10-CM

## 2019-03-15 DIAGNOSIS — E782 Mixed hyperlipidemia: Secondary | ICD-10-CM | POA: Diagnosis not present

## 2019-03-15 DIAGNOSIS — Z9861 Coronary angioplasty status: Secondary | ICD-10-CM

## 2019-03-15 NOTE — Patient Instructions (Addendum)
Medication Instructions:  Your physician recommends that you continue on your current medications as directed. Please refer to the Current Medication list given to you today.  *If you need a refill on your cardiac medications before your next appointment, please call your pharmacy*  Lab Work: Your physician recommends that you return for lab work on Wednesday Jan. 13 You will need to FAST for this appointment - nothing to eat or drink after midnight the night before except water.   If you have labs (blood work) drawn today and your tests are completely normal, you will receive your results only by: Marland Kitchen MyChart Message (if you have MyChart) OR . A paper copy in the mail If you have any lab test that is abnormal or we need to change your treatment, we will call you to review the results.   Testing/Procedures: Your physician has requested that you have an echocardiogram in 6 months on June 29. Echocardiography is a painless test that uses sound waves to create images of your heart. It provides your doctor with information about the size and shape of your heart and how well your heart's chambers and valves are working. This procedure takes approximately one hour. There are no restrictions for this procedure.    Follow-Up: At [facility], you and your health needs are our priority.  As part of our continuing mission to provide you with exceptional heart care, we have created designated Provider Care Teams.  These Care Teams include your primary Cardiologist (physician) and Advanced Practice Providers (APPs -  Physician Assistants and Nurse Practitioners) who all work together to provide you with the care you need, when you need it.  Your next appointment:   6 month(s)  The format for your next appointment:   Either In Person or Virtual  Provider:   You may see Mertie Moores, MD or one of the following Advanced Practice Providers on your designated Care Team:    Richardson Dopp, PA-C  [place], [place]  Daune Perch, [place]

## 2019-03-15 NOTE — Progress Notes (Signed)
[name] Date of Birth  Feb 25, 1946 [facility] [address]    [place], [place]  [address] [phone]  Fax  [phone]  Problem List 1. Aortic valve replacement 2. Hyperlipidemia 3. Breast cancer 4. CAD -  1. Mid LAD to Dist LAD lesion, 100% stenosed. Post intervention with a single Synergy DES 2.25 mm x 20 mm S/p mini vision stent to distal LAD     Bethena Roys is a 74 year old female with a history of aortic stenosis-status post aortic valve replacement.  She also has a history of hypercholesterolemia.  She's not had any episodes of chest pain or shortness of breath.  She's had her INR levels checked in our Coumadin clinic. Her INR levels have all been therapeutic.  Nov. 6 , 2014  Bethena Roys is doing well.  i saw her a year ago.  She is 10 years our from her breast cancer and is doing well.    No cardiac complaints.   Able to do all of her normal activities.     Nov. 13, 2015:  Bethena Roys is doing well.   No CP .  No dyspnea.   Has been stressful.  Her mother died this past year.   Her INR levels have been  Well controlled   Nov. 14, 2016:  Doing well.  No cardiac issues.  INR levels have been stable,  A bit low the last time .  Had an AVR 13 years ago .    June 05, 2015:  Bethena Roys was recently seen at the hospital for an acute anterior wall ST segment elevation myocardial infarction. She had stenting of her mid LAD. She had recurrent pain on the day of her original discharge and was taken back to the Cath Lab and had a second stent placed. She continued to have some intermittent episodes of chest discomfort. She has had persistent ST segment elevation. Her left ventricular systolic function is moderately depressed with an EF of 40-45%.   She has akinesis of the distal anterior wall and apex.   She is not having the band like chest pain that she had on presentation Has had some GERD.   Asked about going back on Omeprazole  - we discussed her need for Protonix with  the plavix .   September 01, 2015:  Bethena Roys is seen back for follow up visit. Still having trouble getting over her MI .  Still fatigued but is slowly getting better.   No CP.      Dec. 12, 2017:  feeling ok Does not have the energy that she wants ( and used to have prior to her MI )  BP has been in the 90-100 range.   September 21, 2016:    Bethena Roys is seen today for her AVR and CAD / MI  Her husband, [name], died last week  Has some leg aches.   Feb. 13, 2019: Bethena Roys is seen back today for follow up of her AVR and CHF Echo shows LV EF of 40% , mean AV gradient is 21 mmHg.  Is on coumadin and plavix  Has occasional left shoulder pain  Had stenting of LAD in March 2017.    Has had some balance issues.   Saw ENT. Thinks it may be orthostatic hypotension  Had ears cleaned out.   Feels better  Aug. 26, 2019:  Doing well. Has CAD, AVR   No recent CP , Gets winded with little exertion EF is 40%.    Bruises  quite a bit - is on coumadin and plavix   Feb. 24, 2020:  Seen for follow up of her CAD and AVR. Continues to have balance issues.   Lists to one side. She has been going to PT .   Was thought to be due to muscle weakness possibly related to the crestor.   She is on Pralulent  And her balance and muscle strength is better.  Works out at [employer] and is doing yoga   January, 2021:  Bethena Roys is seen today for a follow-up visit.  She was in the emergency room last week with chest pain. The pain was described as an achiness located under both breast.  It did been there for a week.  Pain seems to be different than her angina equivalent.  She had -2 - troponins while in the emergency room and was discharged in satisfactory condition.  The pain settled in her left shoulder blade .  No dyspnea, no pleuretic cp    Current Outpatient Medications on File Prior to Visit  Medication Sig Dispense Refill  . Alirocumab (PRALUENT) 75 MG/ML SOAJ Inject 1 pen into the skin every 14 (fourteen) days. 2 pen 11   . amoxicillin (AMOXIL) 500 MG capsule TAKE 4 CAPSULES BY MOUTH ONE HOUR PRIOR TO APPOINTMENT    . BIOTIN PO Take 1 tablet by mouth every morning.     . Calcium Carbonate-Vitamin D (CALCIUM 600+D) 600-400 MG-UNIT per tablet Take 1 tablet by mouth 2 (two) times daily.      . carvedilol (COREG) 3.125 MG tablet TAKE 1 TABLET (3.125 MG TOTAL) BY MOUTH 2 (TWO) TIMES DAILY WITH A MEAL. 180 tablet 0  . clopidogrel (PLAVIX) 75 MG tablet TAKE 1 TABLET (75 MG TOTAL) BY MOUTH DAILY WITH BREAKFAST. 90 tablet 3  . cycloSPORINE (RESTASIS) 0.05 % ophthalmic emulsion Place 1 drop into both eyes 2 (two) times daily as needed (DRY EYE).     . Desoximetasone 0.05 % GEL Apply 1 application topically as needed (AS NEEDED TO AFFECTED AREA).     . Glucosamine-Chondroit-Vit C-Mn (GLUCOSAMINE CHONDR 1500 COMPLX) CAPS Take 1 capsule by mouth every morning.     . loratadine (CLARITIN) 10 MG tablet Take 10 mg by mouth daily.      . Multiple Vitamin (MULTIVITAMIN) capsule Take 1 capsule by mouth daily.      . nitroGLYCERIN (NITROSTAT) 0.4 MG SL tablet Place 1 tablet (0.4 mg total) under the tongue every 5 (five) minutes as needed for chest pain. 25 tablet 6  . pantoprazole (PROTONIX) 40 MG tablet TAKE 1 TABLET BY MOUTH EVERY DAY 90 tablet 3  . PAZEO 0.7 % SOLN INSTILL 1 DROP INTO BOTH EYES DAILY AS NEEDED FOR ALLERGIES  5  . temazepam (RESTORIL) 30 MG capsule TAKE 1 CAPSULE (30 MG TOTAL) BY MOUTH AT BEDTIME AS NEEDED FOR SLEEP. 30 capsule 0  . warfarin (COUMADIN) 5 MG tablet TAKE AS DIRECTED BY COUMADIN CLINIC 80 tablet 1   Current Facility-Administered Medications on File Prior to Visit  Medication Dose Route Frequency Provider Last Rate Last Admin  . 0.9 %  sodium chloride infusion  500 mL Intravenous Continuous Gatha Mayer, MD        Allergies  Allergen Reactions  . Latex Hives  . Meperidine Nausea Only  . Sulfa Antibiotics Nausea Only  . Crestor [Rosuvastatin Calcium] Other (See Comments)    Myalgias on 10mg   and 20mg  daily  . Pravastatin Other (See Comments)  myalgias  . Sulfamethoxazole Nausea And Vomiting    Past Medical History:  Diagnosis Date  . Aortic valve replaced   . Breast cancer ([place]) 2005  . FHx: migraine headaches   . GERD (gastroesophageal reflux disease)   . Hiatal hernia   . Hx Breast Cancer, IDC, Stage I 07/03/2003  . Hx of breast cancer   . Hyperlipidemia   . Hypertension   . Menopausal syndrome   . Osteopenia   . 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 Personal history of radiation therapy   . Status post aortic valve repair     Past Surgical History:  Procedure Laterality Date  . AORTIC VALVE REPLACEMENT  2003  . BREAST BIOPSY    . BREAST LUMPECTOMY Left 2005  . CARDIAC CATHETERIZATION     Ejection Fraction   . CARDIAC CATHETERIZATION N/A 05/16/2015   Procedure: Left Heart Cath and Coronary Angiography;  Surgeon: Leonie Man, MD;  Location: [facility];  Service: Cardiovascular;  Laterality: N/A;  . CARDIAC CATHETERIZATION  05/16/2015   Procedure: Coronary Stent Intervention;  Surgeon: Leonie Man, MD;  Location: [facility];  Service: Cardiovascular;;  . CARDIAC CATHETERIZATION N/A 05/19/2015   Procedure: Left Heart Cath and Coronary Angiography;  Surgeon: Troy Sine, MD;  Location: [facility];  Service: Cardiovascular;  Laterality: N/A;  . CARDIAC CATHETERIZATION N/A 05/19/2015   Procedure: Coronary Stent Intervention;  Surgeon: Troy Sine, MD;  Location: [facility];  Service: Cardiovascular;  Laterality: N/A;  . HIATAL HERNIA REPAIR    . HIATAL HERNIA REPAIR  05/11/2007  . hysterectomy, endometiral cancer  2001  . lummpectomy breast cancer  07/03/2003  . nissan fundoplicaiton  [id number]   post-op hematoma on heparin   . right shouler surgery      Social History   Tobacco Use  Smoking Status Never Smoker  Smokeless Tobacco Never Used    Social History   Substance and Sexual Activity  Alcohol  Use Yes  . Alcohol/week: 0.0 standard drinks   Comment: socially    Family History  Problem Relation Age of Onset  . Heart attack Mother 46  . Osteoarthritis Brother        hpercholesterolemia    Reviw of Systems:  Noted in current history, otherwise review of systems is negative.   Physical Exam: Blood pressure 124/62, pulse 71, height 5\' 1"  (1.549 m), weight 138 lb (62.6 kg), SpO2 100 %.  GEN:   Middle age female,  nad HEENT: Normal NECK: No JVD; No carotid bruits LYMPHATICS: No lymphadenopathy CARDIAC: RRR  ,  No chest wall tenderness.  RESPIRATORY:  Clear to auscultation without rales, wheezing or rhonchi  ABDOMEN: Soft, non-tender, non-distended MUSCULOSKELETAL:  No edema; No deformity  SKIN: Warm and dry NEUROLOGIC:  Alert and oriented x 3  ECG:      Assessment / Plan:   1. Aortic valve replacement -     Valve sound great  Continue Coumadin.  Her INR levels are managed by our Coumadin clinic.  2. Hyperlipidemia -    she will return next week for fasting lipids, liver enzymes and basic metabolic profile.  3. Chest wall pain: She presented to the emergency room several days ago with chest wall pain.  The pain made going on between 7 and 8 days.  Her troponins were negative x2.  Her EKG was unremarkable.  At this point I do not think this was an acute  coronary syndrome.  She states that the pain was completely different than her previous episodes of angina.  She will see her medical doctor for further follow-up.  4. CAD  -she is not having any episodes of angina.  Continue current medications.  5. Chronic systolic CHF:   EF i s [id number].    She has mild shortness of breath with exertion.  She is not having any other chest pain.  I encouraged her to exercise on a regular basis.     Mertie Moores, MD  03/15/2019 1:48 PM    [address],  [place], [place]  [address] Pager [phone] Phone: [phone]; Fax: [phone]

## 2019-03-18 ENCOUNTER — Other Ambulatory Visit: Payer: Self-pay

## 2019-03-18 MED ORDER — PRALUENT 75 MG/ML ~~LOC~~ SOAJ: 1.0000 "pen " | SUBCUTANEOUS | 11 refills | Status: DC

## 2019-03-19 ENCOUNTER — Other Ambulatory Visit: Payer: Self-pay | Admitting: Hematology & Oncology

## 2019-03-19 DIAGNOSIS — Z853 Personal history of malignant neoplasm of breast: Secondary | ICD-10-CM

## 2019-03-20 ENCOUNTER — Other Ambulatory Visit: Payer: Medicare PPO | Admitting: *Deleted

## 2019-03-20 ENCOUNTER — Other Ambulatory Visit: Payer: Self-pay

## 2019-03-20 ENCOUNTER — Ambulatory Visit (INDEPENDENT_AMBULATORY_CARE_PROVIDER_SITE_OTHER): Payer: Medicare PPO

## 2019-03-20 DIAGNOSIS — Z5181 Encounter for therapeutic drug level monitoring: Secondary | ICD-10-CM

## 2019-03-20 DIAGNOSIS — I2510 Atherosclerotic heart disease of native coronary artery without angina pectoris: Secondary | ICD-10-CM

## 2019-03-20 DIAGNOSIS — Z952 Presence of prosthetic heart valve: Secondary | ICD-10-CM

## 2019-03-20 DIAGNOSIS — E782 Mixed hyperlipidemia: Secondary | ICD-10-CM

## 2019-03-20 LAB — LIPID PANEL
Chol/HDL Ratio: 3 ratio (ref 0.0–4.4)
Cholesterol, Total: 137 mg/dL (ref 100–199)
HDL: 46 mg/dL (ref >39)
LDL Chol Calc (NIH): 58 mg/dL (ref 0–99)
Triglycerides: 199 mg/dL — ABNORMAL HIGH (ref 0–149)
VLDL Cholesterol Cal: 33 mg/dL (ref 5–40)

## 2019-03-20 LAB — HEPATIC FUNCTION PANEL
ALT: 18 IU/L (ref 0–32)
AST: 27 IU/L (ref 0–40)
Albumin: 4.4 g/dL (ref 3.7–4.7)
Alkaline Phosphatase: 60 IU/L (ref 39–117)
Bilirubin Total: 0.3 mg/dL (ref 0.0–1.2)
Bilirubin, Direct: 0.1 mg/dL (ref 0.00–0.40)
Total Protein: 6.5 g/dL (ref 6.0–8.5)

## 2019-03-20 LAB — BASIC METABOLIC PANEL
BUN/Creatinine Ratio: 18 (ref 12–28)
BUN: 17 mg/dL (ref 8–27)
CO2: 25 mmol/L (ref 20–29)
Calcium: 9.2 mg/dL (ref 8.7–10.3)
Chloride: 103 mmol/L (ref 96–106)
Creatinine, Ser: 0.97 mg/dL (ref 0.57–1.00)
GFR calc Af Amer: 67 mL/min/{1.73_m2} (ref >59)
GFR calc non Af Amer: 59 mL/min/{1.73_m2} — ABNORMAL LOW (ref >59)
Glucose: 109 mg/dL — ABNORMAL HIGH (ref 65–99)
Potassium: 4.1 mmol/L (ref 3.5–5.2)
Sodium: 140 mmol/L (ref 134–144)

## 2019-03-20 LAB — POCT INR: INR: 2.4 (ref 2.0–3.0)

## 2019-03-20 NOTE — Patient Instructions (Signed)
Description   Continue taking 1/2 tablet daily except 1 tablet on Tuesdays, Thursdays, and Saturdays. Recheck in 2-3 weeks. Coumadin Clinic [phone]

## 2019-03-22 ENCOUNTER — Ambulatory Visit: Payer: Medicare PPO | Attending: Internal Medicine

## 2019-03-22 ENCOUNTER — Other Ambulatory Visit: Payer: Self-pay

## 2019-03-22 DIAGNOSIS — Z20822 Contact with and (suspected) exposure to covid-19: Secondary | ICD-10-CM

## 2019-03-23 LAB — NOVEL CORONAVIRUS, NAA: SARS-CoV-2, NAA: NOT DETECTED

## 2019-03-27 ENCOUNTER — Telehealth: Payer: Self-pay | Admitting: Pharmacist

## 2019-03-27 MED ORDER — REPATHA SURECLICK 140 MG/ML ~~LOC~~ SOAJ: 1.0000 "pen " | SUBCUTANEOUS | 11 refills | Status: DC

## 2019-03-27 NOTE — Telephone Encounter (Signed)
Patient called stating she received a letter that insurance will no longer cover Praluent- or that its not preferred. Prior auth for Repatha sent and approved. Rx sent to [facility]. Voicemail left for patient

## 2019-03-27 NOTE — Telephone Encounter (Signed)
Patient made aware of the information below

## 2019-04-07 ENCOUNTER — Other Ambulatory Visit: Payer: Self-pay | Admitting: Cardiovascular Disease

## 2019-04-08 ENCOUNTER — Telehealth (INDEPENDENT_AMBULATORY_CARE_PROVIDER_SITE_OTHER): Payer: Medicare PPO | Admitting: Family Medicine

## 2019-04-08 DIAGNOSIS — R079 Chest pain, unspecified: Secondary | ICD-10-CM

## 2019-04-08 DIAGNOSIS — H938X1 Other specified disorders of right ear: Secondary | ICD-10-CM | POA: Diagnosis not present

## 2019-04-08 DIAGNOSIS — E781 Pure hyperglyceridemia: Secondary | ICD-10-CM

## 2019-04-08 NOTE — Progress Notes (Signed)
Virtual Visit via Video Note  I connected with Amanda Wiggins on 04/08/19 at 10:00 AM EST by a video enabled telemedicine application 2/2 [id number] pandemic and verified that I am speaking with the correct person using two identifiers.  Location patient: home Location provider:work or home office Persons participating in the virtual visit: patient, provider  I discussed the limitations of evaluation and management by telemedicine and the availability of in person appointments. The patient expressed understanding and agreed to proceed.   HPI: Pt is a 74 yo female with pmh sig for h/o STEMI, CAD s/p PCI, ischemic cardiomyopathy, systolic CHF, AVR on coumadin and plavix, hiatal hernia, GERD, osteoporosis, h/o L breast cancer, endometrial cancer who was seen for f/u.  Pt is excited about appt for COVID vaccine tomorrow in [facility].  She is working on her triglycerides, lost 6 lbs.  Will be changed to repatha from praluent as requested by insurance.  Followed by Cardiology.  Notes some chest pain in Dec. Took SL NTG which helped some.  Pt called the office, was advised to go to the ED.  She then called her Cardiologist office who also advised her to go to the ED.  Pt finally went to the ED but spent 13 hours there.  Labs were normal with the exception of subtherapeutic INR at 1.9.  At Cardiology f/u her triglycerides have improved some.   Pt notes Dec is a hard time as she remembers family members who are no longer with her.  Pt notes ongoing issues with R ear.  Went to ENT after having blood dripping from R ear.  Pt advised was 2/2 dry skin in ear from decreased estrogen.  Notes bleeding has stopped, but ear "still feels off".  Has another appt with ENT this wk.  ROS: See pertinent positives and negatives per HPI.  Past Medical History:  Diagnosis Date  . Aortic valve replaced   . Breast cancer ([place]) 2005  . FHx: migraine headaches   . GERD (gastroesophageal reflux disease)   . Hiatal hernia    . Hx Breast Cancer, IDC, Stage I 07/03/2003  . Hx of breast cancer   . Hyperlipidemia   . Hypertension   . Menopausal syndrome   . Osteopenia   . 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 Personal history of radiation therapy   . Status post aortic valve repair     Past Surgical History:  Procedure Laterality Date  . AORTIC VALVE REPLACEMENT  2003  . BREAST BIOPSY    . BREAST LUMPECTOMY Left 2005  . CARDIAC CATHETERIZATION     Ejection Fraction   . CARDIAC CATHETERIZATION N/A 05/16/2015   Procedure: Left Heart Cath and Coronary Angiography;  Surgeon: Leonie Man, MD;  Location: [facility];  Service: Cardiovascular;  Laterality: N/A;  . CARDIAC CATHETERIZATION  05/16/2015   Procedure: Coronary Stent Intervention;  Surgeon: Leonie Man, MD;  Location: [facility];  Service: Cardiovascular;;  . CARDIAC CATHETERIZATION N/A 05/19/2015   Procedure: Left Heart Cath and Coronary Angiography;  Surgeon: Troy Sine, MD;  Location: [facility];  Service: Cardiovascular;  Laterality: N/A;  . CARDIAC CATHETERIZATION N/A 05/19/2015   Procedure: Coronary Stent Intervention;  Surgeon: Troy Sine, MD;  Location: [facility];  Service: Cardiovascular;  Laterality: N/A;  . HIATAL HERNIA REPAIR    . HIATAL HERNIA REPAIR  05/11/2007  . hysterectomy, endometiral cancer  2001  . lummpectomy breast cancer  07/03/2003  . nissan fundoplicaiton  [id number]   post-op hematoma on heparin   . right shouler surgery      Family History  Problem Relation Age of Onset  . Heart attack Mother 42  . Osteoarthritis Brother        hpercholesterolemia     Current Outpatient Medications:  .  amoxicillin (AMOXIL) 500 MG capsule, TAKE 4 CAPSULES BY MOUTH ONE HOUR PRIOR TO APPOINTMENT, Disp: , Rfl:  .  BIOTIN PO, Take 1 tablet by mouth every morning. , Disp: , Rfl:  .  Calcium Carbonate-Vitamin D (CALCIUM 600+D) 600-400 MG-UNIT per tablet, Take 1 tablet by mouth 2  (two) times daily.  , Disp: , Rfl:  .  carvedilol (COREG) 3.125 MG tablet, TAKE 1 TABLET (3.125 MG TOTAL) BY MOUTH 2 (TWO) TIMES DAILY WITH A MEAL., Disp: 180 tablet, Rfl: 0 .  clopidogrel (PLAVIX) 75 MG tablet, TAKE 1 TABLET (75 MG TOTAL) BY MOUTH DAILY WITH BREAKFAST., Disp: 90 tablet, Rfl: 3 .  cycloSPORINE (RESTASIS) 0.05 % ophthalmic emulsion, Place 1 drop into both eyes 2 (two) times daily as needed (DRY EYE). , Disp: , Rfl:  .  Desoximetasone 0.05 % GEL, Apply 1 application topically as needed (AS NEEDED TO AFFECTED AREA). , Disp: , Rfl:  .  Evolocumab (REPATHA SURECLICK) XX123456 MG/ML SOAJ, Inject 1 pen into the skin every 14 (fourteen) days., Disp: 2 pen, Rfl: 11 .  Glucosamine-Chondroit-Vit C-Mn (GLUCOSAMINE CHONDR 1500 COMPLX) CAPS, Take 1 capsule by mouth every morning. , Disp: , Rfl:  .  loratadine (CLARITIN) 10 MG tablet, Take 10 mg by mouth daily.  , Disp: , Rfl:  .  Multiple Vitamin (MULTIVITAMIN) capsule, Take 1 capsule by mouth daily.  , Disp: , Rfl:  .  nitroGLYCERIN (NITROSTAT) 0.4 MG SL tablet, Place 1 tablet (0.4 mg total) under the tongue every 5 (five) minutes as needed for chest pain., Disp: 25 tablet, Rfl: 6 .  pantoprazole (PROTONIX) 40 MG tablet, TAKE 1 TABLET BY MOUTH EVERY DAY, Disp: 90 tablet, Rfl: 3 .  PAZEO 0.7 % SOLN, INSTILL 1 DROP INTO BOTH EYES DAILY AS NEEDED FOR ALLERGIES, Disp: , Rfl: 5 .  temazepam (RESTORIL) 30 MG capsule, TAKE 1 CAPSULE (30 MG TOTAL) BY MOUTH AT BEDTIME AS NEEDED FOR SLEEP., Disp: 30 capsule, Rfl: 0 .  warfarin (COUMADIN) 5 MG tablet, TAKE AS DIRECTED BY COUMADIN CLINIC, Disp: 80 tablet, Rfl: 1  Current Facility-Administered Medications:  .  0.9 %  sodium chloride infusion, 500 mL, Intravenous, Continuous, Carlean Purl, Ofilia Neas, MD  EXAM:  VITALS per patient if applicable:  RR between 12-20 bpm  GENERAL: alert, oriented, appears well and in no acute distress  HEENT: atraumatic, conjunctiva clear, no obvious abnormalities on inspection of  external nose and ears  NECK: normal movements of the head and neck  LUNGS: on inspection no signs of respiratory distress, breathing rate appears normal, no obvious gross SOB, gasping or wheezing  CV: no obvious cyanosis  MS: moves all visible extremities without noticeable abnormality  PSYCH/NEURO: pleasant and cooperative, no obvious depression or anxiety, speech and thought processing grossly intact  ASSESSMENT AND PLAN:  Discussed the following assessment and plan:  Nonspecific chest pain -discussed labs and studies reassuring. -discussed possible causes including anxiety, GERD, MSK issue such as costochondritis -discussed ways to reduce anxiety/stress. -given precautions -continue f/u with Cardiology  Pure hypertriglyceridemia -continue lifestyle modifications -congratulated on weight loss -Praluent being changed to Sophia -continue f/u with Cardiology  Ear  congestion, right -keep f/u appt with ENT.  F/u in 6 months, sooner if needed.   I discussed the assessment and treatment plan with the patient. The patient was provided an opportunity to ask questions and all were answered. The patient agreed with the plan and demonstrated an understanding of the instructions.   The patient was advised to call back or seek an in-person evaluation if the symptoms worsen or if the condition fails to improve as anticipated.  Billie Ruddy, MD

## 2019-04-09 ENCOUNTER — Other Ambulatory Visit: Payer: Self-pay

## 2019-04-09 ENCOUNTER — Ambulatory Visit (INDEPENDENT_AMBULATORY_CARE_PROVIDER_SITE_OTHER): Payer: Medicare PPO | Admitting: *Deleted

## 2019-04-09 DIAGNOSIS — Z5181 Encounter for therapeutic drug level monitoring: Secondary | ICD-10-CM

## 2019-04-09 DIAGNOSIS — Z952 Presence of prosthetic heart valve: Secondary | ICD-10-CM | POA: Diagnosis not present

## 2019-04-09 LAB — POCT INR: INR: 2.4 (ref 2.0–3.0)

## 2019-04-09 NOTE — Patient Instructions (Signed)
Description   Continue taking 1/2 tablet daily except 1 tablet on Tuesdays, Thursdays, and Saturdays. Recheck in 4 weeks. [facility] [phone]

## 2019-04-18 ENCOUNTER — Other Ambulatory Visit: Payer: Self-pay | Admitting: Hematology & Oncology

## 2019-04-18 DIAGNOSIS — Z853 Personal history of malignant neoplasm of breast: Secondary | ICD-10-CM

## 2019-05-01 ENCOUNTER — Other Ambulatory Visit: Payer: Medicare Other

## 2019-05-03 ENCOUNTER — Telehealth: Payer: Self-pay | Admitting: Pharmacist

## 2019-05-03 NOTE — Telephone Encounter (Signed)
Received letter that PA was needed for patients Repatha. PA submitted and approved through 03/06/20

## 2019-05-06 ENCOUNTER — Ambulatory Visit: Payer: Medicare Other | Admitting: Cardiovascular Disease

## 2019-05-07 ENCOUNTER — Ambulatory Visit (INDEPENDENT_AMBULATORY_CARE_PROVIDER_SITE_OTHER): Payer: Medicare PPO | Admitting: *Deleted

## 2019-05-07 ENCOUNTER — Other Ambulatory Visit: Payer: Self-pay

## 2019-05-07 DIAGNOSIS — Z5181 Encounter for therapeutic drug level monitoring: Secondary | ICD-10-CM

## 2019-05-07 DIAGNOSIS — Z952 Presence of prosthetic heart valve: Secondary | ICD-10-CM

## 2019-05-07 LAB — POCT INR: INR: 2.4 (ref 2.0–3.0)

## 2019-05-07 NOTE — Patient Instructions (Signed)
Description   Continue taking 1/2 tablet daily except 1 tablet on Tuesdays, Thursdays, and Saturdays. Recheck in 5 weeks. Coumadin Clinic [phone]

## 2019-05-14 ENCOUNTER — Other Ambulatory Visit: Payer: Self-pay | Admitting: Cardiovascular Disease

## 2019-05-17 ENCOUNTER — Other Ambulatory Visit: Payer: Self-pay | Admitting: Hematology & Oncology

## 2019-05-17 DIAGNOSIS — Z853 Personal history of malignant neoplasm of breast: Secondary | ICD-10-CM

## 2019-06-11 ENCOUNTER — Ambulatory Visit (INDEPENDENT_AMBULATORY_CARE_PROVIDER_SITE_OTHER): Payer: Medicare PPO

## 2019-06-11 ENCOUNTER — Other Ambulatory Visit: Payer: Self-pay

## 2019-06-11 DIAGNOSIS — Z5181 Encounter for therapeutic drug level monitoring: Secondary | ICD-10-CM | POA: Diagnosis not present

## 2019-06-11 DIAGNOSIS — Z952 Presence of prosthetic heart valve: Secondary | ICD-10-CM | POA: Diagnosis not present

## 2019-06-11 LAB — POCT INR: INR: 2.3 (ref 2.0–3.0)

## 2019-06-11 NOTE — Patient Instructions (Signed)
Description   Continue on same dosage 1/2 tablet daily except 1 tablet on Tuesdays, Thursdays, and Saturdays. Recheck in 6 weeks. Coumadin Clinic [phone]

## 2019-06-12 ENCOUNTER — Telehealth: Payer: Self-pay | Admitting: Pharmacist

## 2019-06-12 NOTE — Telephone Encounter (Signed)
Pt called clinic to report myalgias and runny nose on Repatha. She tolerated Praluent much better and was only changed to Repatha due to formulary requirement. She is already intolerant to rosuvastatin 20mg  daily, pravastatin, simvastatin, and Zetia. LDL goal < 70 due to history of CAD s/p STEMI and PCI.  Will resubmit prior auth for Praluent to see if her insurance will cover it since she does not tolerate their formulary agent, Repatha.

## 2019-06-12 NOTE — Telephone Encounter (Signed)
Pa submitted today w/[employer] insurance for praluent 75mg 

## 2019-06-13 MED ORDER — PRALUENT 75 MG/ML ~~LOC~~ SOAJ: 1.0000 "pen " | SUBCUTANEOUS | 11 refills | Status: DC

## 2019-06-13 NOTE — Addendum Note (Signed)
Addended by: Marcelle Overlie D on: 06/13/2019 08:23 AM   Modules accepted: Orders

## 2019-06-13 NOTE — Telephone Encounter (Signed)
praluent approved through 03/06/20. Rx for Praluent sent to pharmacy. Patient made aware and was very grateful for the help. Marland Kitchen

## 2019-06-18 ENCOUNTER — Other Ambulatory Visit: Payer: Self-pay | Admitting: Hematology & Oncology

## 2019-06-18 DIAGNOSIS — Z853 Personal history of malignant neoplasm of breast: Secondary | ICD-10-CM

## 2019-06-26 DIAGNOSIS — M533 Sacrococcygeal disorders, not elsewhere classified: Secondary | ICD-10-CM | POA: Diagnosis not present

## 2019-06-26 DIAGNOSIS — M545 Low back pain: Secondary | ICD-10-CM | POA: Diagnosis not present

## 2019-07-09 ENCOUNTER — Other Ambulatory Visit: Payer: Self-pay | Admitting: Obstetrics

## 2019-07-09 DIAGNOSIS — Z1231 Encounter for screening mammogram for malignant neoplasm of breast: Secondary | ICD-10-CM

## 2019-07-18 ENCOUNTER — Other Ambulatory Visit: Payer: Self-pay | Admitting: Hematology & Oncology

## 2019-07-18 DIAGNOSIS — Z853 Personal history of malignant neoplasm of breast: Secondary | ICD-10-CM

## 2019-07-23 ENCOUNTER — Other Ambulatory Visit: Payer: Self-pay

## 2019-07-23 ENCOUNTER — Ambulatory Visit (INDEPENDENT_AMBULATORY_CARE_PROVIDER_SITE_OTHER): Payer: Medicare PPO | Admitting: *Deleted

## 2019-07-23 DIAGNOSIS — Z5181 Encounter for therapeutic drug level monitoring: Secondary | ICD-10-CM | POA: Diagnosis not present

## 2019-07-23 DIAGNOSIS — Z952 Presence of prosthetic heart valve: Secondary | ICD-10-CM | POA: Diagnosis not present

## 2019-07-23 LAB — POCT INR: INR: 2.1 (ref 2.0–3.0)

## 2019-07-23 NOTE — Patient Instructions (Signed)
Description   Continue on same dosage 1/2 tablet daily except 1 tablet on Tuesdays, Thursdays, and Saturdays. Recheck in 6 weeks. Coumadin Clinic [phone]

## 2019-08-12 ENCOUNTER — Other Ambulatory Visit: Payer: Self-pay | Admitting: Cardiovascular Disease

## 2019-08-12 ENCOUNTER — Other Ambulatory Visit (HOSPITAL_COMMUNITY): Payer: Medicare PPO

## 2019-08-13 ENCOUNTER — Inpatient Hospital Stay: Payer: Medicare PPO | Attending: Hematology & Oncology | Admitting: Hematology & Oncology

## 2019-08-13 ENCOUNTER — Telehealth: Payer: Self-pay | Admitting: Hematology & Oncology

## 2019-08-13 ENCOUNTER — Inpatient Hospital Stay: Payer: Medicare PPO

## 2019-08-13 ENCOUNTER — Other Ambulatory Visit: Payer: Self-pay

## 2019-08-13 ENCOUNTER — Ambulatory Visit (INDEPENDENT_AMBULATORY_CARE_PROVIDER_SITE_OTHER): Payer: Medicare PPO | Admitting: Pharmacist

## 2019-08-13 ENCOUNTER — Encounter: Payer: Self-pay | Admitting: Hematology & Oncology

## 2019-08-13 VITALS — BP 144/55 | HR 63 | Temp 97.7°F | Resp 18 | Ht 61.0 in | Wt 143.0 lb

## 2019-08-13 DIAGNOSIS — Z882 Allergy status to sulfonamides status: Secondary | ICD-10-CM | POA: Insufficient documentation

## 2019-08-13 DIAGNOSIS — Z79899 Other long term (current) drug therapy: Secondary | ICD-10-CM | POA: Insufficient documentation

## 2019-08-13 DIAGNOSIS — Z5181 Encounter for therapeutic drug level monitoring: Secondary | ICD-10-CM | POA: Diagnosis not present

## 2019-08-13 DIAGNOSIS — M818 Other osteoporosis without current pathological fracture: Secondary | ICD-10-CM

## 2019-08-13 DIAGNOSIS — Z885 Allergy status to narcotic agent status: Secondary | ICD-10-CM | POA: Insufficient documentation

## 2019-08-13 DIAGNOSIS — Z952 Presence of prosthetic heart valve: Secondary | ICD-10-CM | POA: Insufficient documentation

## 2019-08-13 DIAGNOSIS — Z853 Personal history of malignant neoplasm of breast: Secondary | ICD-10-CM

## 2019-08-13 DIAGNOSIS — T386X5A Adverse effect of antigonadotrophins, antiestrogens, antiandrogens, not elsewhere classified, initial encounter: Secondary | ICD-10-CM | POA: Diagnosis not present

## 2019-08-13 DIAGNOSIS — Z7901 Long term (current) use of anticoagulants: Secondary | ICD-10-CM | POA: Insufficient documentation

## 2019-08-13 LAB — CMP (CANCER CENTER ONLY)
ALT: 18 U/L (ref 0–44)
AST: 24 U/L (ref 15–41)
Albumin: 4.4 g/dL (ref 3.5–5.0)
Alkaline Phosphatase: 55 U/L (ref 38–126)
Anion gap: 6 (ref 5–15)
BUN: 22 mg/dL (ref 8–23)
CO2: 30 mmol/L (ref 22–32)
Calcium: 9.5 mg/dL (ref 8.9–10.3)
Chloride: 105 mmol/L (ref 98–111)
Creatinine: 0.92 mg/dL (ref 0.44–1.00)
GFR, Est AFR Am: >60 mL/min (ref >60)
GFR, Estimated: >60 mL/min (ref >60)
Glucose, Bld: 117 mg/dL — ABNORMAL HIGH (ref 70–99)
Potassium: 4 mmol/L (ref 3.5–5.1)
Sodium: 141 mmol/L (ref 135–145)
Total Bilirubin: 0.3 mg/dL (ref 0.3–1.2)
Total Protein: 6.9 g/dL (ref 6.5–8.1)

## 2019-08-13 LAB — CBC WITH DIFFERENTIAL (CANCER CENTER ONLY)
Abs Immature Granulocytes: 0.02 10*3/uL (ref 0.00–0.07)
Basophils Absolute: 0 10*3/uL (ref 0.0–0.1)
Basophils Relative: 1 %
Eosinophils Absolute: 0.2 10*3/uL (ref 0.0–0.5)
Eosinophils Relative: 4 %
HCT: 46.1 % — ABNORMAL HIGH (ref 36.0–46.0)
Hemoglobin: 14.6 g/dL (ref 12.0–15.0)
Immature Granulocytes: 0 %
Lymphocytes Relative: 24 %
Lymphs Abs: 1.5 10*3/uL (ref 0.7–4.0)
MCH: 28.9 pg (ref 26.0–34.0)
MCHC: 31.7 g/dL (ref 30.0–36.0)
MCV: 91.3 fL (ref 80.0–100.0)
Monocytes Absolute: 0.5 10*3/uL (ref 0.1–1.0)
Monocytes Relative: 7 %
Neutro Abs: 4 10*3/uL (ref 1.7–7.7)
Neutrophils Relative %: 64 %
Platelet Count: 287 10*3/uL (ref 150–400)
RBC: 5.05 MIL/uL (ref 3.87–5.11)
RDW: 13.1 % (ref 11.5–15.5)
WBC Count: 6.2 10*3/uL (ref 4.0–10.5)
nRBC: 0 % (ref 0.0–0.2)

## 2019-08-13 LAB — PROTIME-INR
INR: 1.9 — ABNORMAL HIGH (ref 0.8–1.2)
Prothrombin Time: 20.9 seconds — ABNORMAL HIGH (ref 11.4–15.2)

## 2019-08-13 MED ORDER — ZOLEDRONIC ACID 4 MG/100ML IV SOLN
4.0000 mg | Freq: Once | INTRAVENOUS | Status: AC
  Administered 2019-08-13: 4 mg via INTRAVENOUS
  Filled 2019-08-13: qty 100

## 2019-08-13 MED ORDER — SODIUM CHLORIDE 0.9 % IV SOLN
Freq: Once | INTRAVENOUS | Status: AC
  Filled 2019-08-13: qty 250

## 2019-08-13 NOTE — Progress Notes (Signed)
Hematology and Oncology Follow Up Visit  Amanda Wiggins [id number] 04-20-1945 74 y.o. 08/13/2019   Principle Diagnosis:  1. Stage I (T1 N0 M0) ductal carcinoma of the left breast. 2. Mechanical aortic valve.  Current Therapy:     Coumadin-lifelong  Zometa 5 mg IV q. Year - given in June 2022     Interim History:  Ms.  Amanda Wiggins is back for followup. She is doing okay.  We see her yearly.  Thankfully, she is making it through the coronavirus.  She has had her vaccines.  She had to go to the emergency room in January.  She thought she may be having a heart attack.  Everything turned out well.  Her INR today is 1.9.  Cardiology monitors this.  She will talk to them.  She has had no bleeding.  There is been no change in bowel or bladder habits.  She has had no swelling in the legs.  She has a mechanical aortic valve.  This is not caused any problems.  Overall, her performance status is ECOG 1.   Medications:  Current Outpatient Medications:  .  Alirocumab (PRALUENT) 75 MG/ML SOAJ, Inject 1 pen into the skin every 14 (fourteen) days., Disp: 2 pen, Rfl: 11 .  BIOTIN PO, Take 1 tablet by mouth every morning. , Disp: , Rfl:  .  Calcium Carbonate-Vitamin D (CALCIUM 600+D) 600-400 MG-UNIT per tablet, Take 1 tablet by mouth 2 (two) times daily.  , Disp: , Rfl:  .  carvedilol (COREG) 3.125 MG tablet, TAKE 1 TABLET (3.125 MG TOTAL) BY MOUTH 2 (TWO) TIMES DAILY WITH A MEAL., Disp: 180 tablet, Rfl: 3 .  clopidogrel (PLAVIX) 75 MG tablet, TAKE 1 TABLET (75 MG TOTAL) BY MOUTH DAILY WITH BREAKFAST., Disp: 90 tablet, Rfl: 3 .  cycloSPORINE (RESTASIS) 0.05 % ophthalmic emulsion, Place 1 drop into both eyes 2 (two) times daily as needed (DRY EYE). , Disp: , Rfl:  .  Desoximetasone 0.05 % GEL, Apply 1 application topically as needed (AS NEEDED TO AFFECTED AREA). , Disp: , Rfl:  .  Glucosamine-Chondroit-Vit C-Mn (GLUCOSAMINE CHONDR 1500 COMPLX) CAPS, Take 1 capsule by mouth every morning. , Disp: , Rfl:   .  loratadine (CLARITIN) 10 MG tablet, Take 10 mg by mouth daily.  , Disp: , Rfl:  .  Multiple Vitamin (MULTIVITAMIN) capsule, Take 1 capsule by mouth daily.  , Disp: , Rfl:  .  pantoprazole (PROTONIX) 40 MG tablet, TAKE 1 TABLET BY MOUTH EVERY DAY, Disp: 90 tablet, Rfl: 3 .  PAZEO 0.7 % SOLN, INSTILL 1 DROP INTO BOTH EYES DAILY AS NEEDED FOR ALLERGIES, Disp: , Rfl: 5 .  temazepam (RESTORIL) 30 MG capsule, TAKE 1 CAPSULE (30 MG TOTAL) BY MOUTH AT BEDTIME AS NEEDED FOR SLEEP., Disp: 30 capsule, Rfl: 0 .  warfarin (COUMADIN) 5 MG tablet, TAKE AS DIRECTED BY COUMADIN CLINIC, Disp: 90 tablet, Rfl: 1 .  amoxicillin (AMOXIL) 500 MG capsule, TAKE 4 CAPSULES BY MOUTH ONE HOUR PRIOR TO APPOINTMENT, Disp: , Rfl:  .  nitroGLYCERIN (NITROSTAT) 0.4 MG SL tablet, Place 1 tablet (0.4 mg total) under the tongue every 5 (five) minutes as needed for chest pain. (Patient not taking: Reported on 08/13/2019), Disp: 25 tablet, Rfl: 6  Allergies:  Allergies  Allergen Reactions  . Latex Hives  . Meperidine Nausea Only  . Sulfa Antibiotics Nausea Only  . Crestor [Rosuvastatin Calcium] Other (See Comments)    Myalgias on 10mg  and 20mg  daily  . Pravastatin Other (See  Comments)    myalgias  . Sulfamethoxazole Nausea And Vomiting  . Repatha [Evolocumab] Other (See Comments)    myalgias    Past Medical History, Surgical history, Social history, and Family History were reviewed and updated.  Review of Systems: Review of Systems  Constitutional: Negative.   HENT: Negative.   Eyes: Negative.   Cardiovascular: Negative.   Gastrointestinal: Negative.   Genitourinary: Negative.   Musculoskeletal: Negative.   Skin: Negative.   Neurological: Negative.   Endo/Heme/Allergies: Negative.   Psychiatric/Behavioral: Negative.      Physical Exam:  height is 5\' 1"  (1.549 m) and weight is 143 lb (64.9 kg). Her temporal temperature is 97.7 F (36.5 C). Her blood pressure is 144/55 (abnormal) and her pulse is 63. Her  respiration is 18 and oxygen saturation is 100%.   Physical Exam Vitals reviewed.  HENT:     Head: Normocephalic and atraumatic.  Eyes:     Pupils: Pupils are equal, round, and reactive to light.  Cardiovascular:     Rate and Rhythm: Normal rate and regular rhythm.     Heart sounds: Normal heart sounds.  Pulmonary:     Effort: Pulmonary effort is normal.     Breath sounds: Normal breath sounds.  Abdominal:     General: Bowel sounds are normal.     Palpations: Abdomen is soft.  Musculoskeletal:        General: No tenderness or deformity. Normal range of motion.     Cervical back: Normal range of motion.  Lymphadenopathy:     Cervical: No cervical adenopathy.  Skin:    General: Skin is warm and dry.     Findings: No erythema or rash.  Neurological:     Mental Status: She is alert and oriented to person, place, and time.  Psychiatric:        Behavior: Behavior normal.        Thought Content: Thought content normal.        Judgment: Judgment normal.     Lab Results  Component Value Date   WBC 6.2 08/13/2019   HGB 14.6 08/13/2019   HCT 46.1 (H) 08/13/2019   MCV 91.3 08/13/2019   PLT 287 08/13/2019     Chemistry      Component Value Date/Time   NA 141 08/13/2019 0931   NA 140 03/20/2019 0849   NA 147 (H) 02/27/2017 0952   NA 142 02/12/2015 1125   K 4.0 08/13/2019 0931   K 4.3 02/27/2017 0952   K 3.8 02/12/2015 1125   CL 105 08/13/2019 0931   CL 106 02/27/2017 0952   CO2 30 08/13/2019 0931   CO2 30 02/27/2017 0952   CO2 24 02/12/2015 1125   BUN 22 08/13/2019 0931   BUN 17 03/20/2019 0849   BUN 14 02/27/2017 0952   BUN 14.7 02/12/2015 1125   CREATININE 0.92 08/13/2019 0931   CREATININE 0.9 02/27/2017 0952   CREATININE 1.0 02/12/2015 1125      Component Value Date/Time   CALCIUM 9.5 08/13/2019 0931   CALCIUM 9.2 02/27/2017 0952   CALCIUM 10.2 02/12/2015 1125   ALKPHOS 55 08/13/2019 0931   ALKPHOS 55 02/27/2017 0952   ALKPHOS 56 02/12/2015 1125   AST 24  08/13/2019 0931   AST 35 (H) 02/12/2015 1125   ALT 18 08/13/2019 0931   ALT 22 02/27/2017 0952   ALT 39 02/12/2015 1125   BILITOT 0.3 08/13/2019 0931   BILITOT 0.45 02/12/2015 1125  Impression and Plan: [name] is 74 year old female with a history of stage I infiltrating duct carcinoma the left breast. She underwent lumpectomy.  She  had been on Femara. She had radiation. It has been 14  years now.  Everything looks good right now.  I do not see any problems from a malignant point of view.  I do not see any evidence of recurrent breast cancer.  We will go ahead and plan to get her back in 12 months.    We will see her back, we will do her Zometa.  Volanda Napoleon, MD 6/8/202110:40 AM

## 2019-08-13 NOTE — Telephone Encounter (Signed)
Appointments scheduled calendar printed per 6/8 los 

## 2019-08-14 DIAGNOSIS — M65312 Trigger thumb, left thumb: Secondary | ICD-10-CM | POA: Diagnosis not present

## 2019-08-14 DIAGNOSIS — M1812 Unilateral primary osteoarthritis of first carpometacarpal joint, left hand: Secondary | ICD-10-CM | POA: Diagnosis not present

## 2019-08-19 ENCOUNTER — Other Ambulatory Visit: Payer: Self-pay | Admitting: Hematology & Oncology

## 2019-08-19 DIAGNOSIS — Z853 Personal history of malignant neoplasm of breast: Secondary | ICD-10-CM

## 2019-08-21 ENCOUNTER — Ambulatory Visit: Payer: Medicare PPO

## 2019-08-22 ENCOUNTER — Ambulatory Visit
Admission: RE | Admit: 2019-08-22 | Discharge: 2019-08-22 | Disposition: A | Discharge status: Home / Self Care | Payer: Medicare PPO | Source: Ambulatory Visit | Attending: Obstetrics | Admitting: Obstetrics

## 2019-08-22 ENCOUNTER — Other Ambulatory Visit: Payer: Self-pay

## 2019-08-22 DIAGNOSIS — Z1231 Encounter for screening mammogram for malignant neoplasm of breast: Secondary | ICD-10-CM | POA: Diagnosis not present

## 2019-09-03 ENCOUNTER — Ambulatory Visit (INDEPENDENT_AMBULATORY_CARE_PROVIDER_SITE_OTHER): Payer: Medicare PPO

## 2019-09-03 ENCOUNTER — Ambulatory Visit (HOSPITAL_COMMUNITY): Payer: Medicare PPO | Attending: Cardiovascular Disease

## 2019-09-03 ENCOUNTER — Other Ambulatory Visit: Payer: Self-pay

## 2019-09-03 DIAGNOSIS — Z5181 Encounter for therapeutic drug level monitoring: Secondary | ICD-10-CM

## 2019-09-03 DIAGNOSIS — I2510 Atherosclerotic heart disease of native coronary artery without angina pectoris: Secondary | ICD-10-CM | POA: Insufficient documentation

## 2019-09-03 DIAGNOSIS — Z952 Presence of prosthetic heart valve: Secondary | ICD-10-CM | POA: Insufficient documentation

## 2019-09-03 LAB — POCT INR: INR: 1.9 — AB (ref 2.0–3.0)

## 2019-09-03 MED ORDER — PERFLUTREN LIPID MICROSPHERE
1.0000 mL | INTRAVENOUS | Status: AC | PRN
  Administered 2019-09-03: 2 mL via INTRAVENOUS

## 2019-09-03 NOTE — Patient Instructions (Signed)
Take 1.5 tablets tonight then start taking 1 tablet daily except 1/2 tablet on Monday, Wednesday, Friday. Recheck in 3 weeks.  [facility] [phone]

## 2019-09-10 NOTE — Progress Notes (Signed)
Cardiology Office Note:    Date:  09/11/2019   ID:  DIVA LEMBERGER, DOB 09-07-1945, MRN [id number]  PCP:  Billie Ruddy, MD  Cardiologist:  Mertie Moores, MD   Electrophysiologist:  None   Referring MD: Billie Ruddy, MD   Chief Complaint:  Follow-up (CHF)    Patient Profile:    Amanda Wiggins is a 74 y.o. female with:   Coronary artery disease   S/p ant STEMI >> PCI: DES to mLAD and BMS to dLAD in 09/784  Systolic CHF  Ischemic CM  EF 3/17: 25-30  EF 7/18: 40  EF 6/21: 30-35  Aortic stenosis  S/p mechanical AVR  Hyperlipidemia   Intol of statins; on Alirocumab   Hypertension   Breast CA  Prior CV studies: Echocardiogram 09/03/19 EF 30-35, mod asymmetric LVH, Gr 1 DD, ant-sept and apical DK, normal RVSF, AVR ok w/o AI and mean gradient 17, mild dilation of ascending aorta (39 mm)  Echocardiogram 09/29/16 EF 40  Echocardiogram 05/21/15 EF 40-45  Echocardiogram 05/18/15 EF 25-30  Cardiac catheterization 05/19/15 LAD mid stent ok; dist 99, 70 PCI: BMS to dLAD  Cardiac catheterization 05/16/15 1. Mid LAD to Dist LAD lesion, 100% stenosed. Post intervention with a single Synergy DES 2.25 mm x 20 mm (2.3 mm), there is a 0% residual stenosis. 2. Dist LAD lesion, 50% stenosed beyond the stented segment. This was not adequately seen pre-stent placement. Post intervention - PTCA with stent balloon at low atmospheres inflation along with IC NTG & Verapamil, there is a 30% residual stenosis. 3. Mechanical aortic valve was not crossed. Fluoroscopically appeared to be functioning well. Both leaflets were not fully visualized, therefore recommend Echocardiogram to assess.    History of Present Illness:    Amanda Wiggins was last seen by Dr. Acie Fredrickson in 03/2019.  She returns for follow up.  A follow up echocardiogram last week demonstrated worsening LVF with EF 30-35.  She is here alone.  She has noted some swelling in her left arm as well as her ankles at times.  She  has not had orthopnea or PND.  She does note shortness of breath with walking up hills.  This has been present since her heart heart attack.  This is unchanged.  She has not had chest discomfort or syncope.  She does have difficulty with balance.  She has an occasional cough that is nonproductive.  No fevers or chills.  She has not had any melena or hematochezia.  Past Medical History:  Diagnosis Date  . Aortic valve replaced   . Breast cancer ([facility]) 2005  . FHx: migraine headaches   . GERD (gastroesophageal reflux disease)   . Hiatal hernia   . Hx Breast Cancer, IDC, Stage I 07/03/2003  . Hx of breast cancer   . Hyperlipidemia   . Hypertension   . Menopausal syndrome   . Osteopenia   . 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 Personal history of radiation therapy   . Status post aortic valve repair     Current Medications: Current Meds  Medication Sig  . Alirocumab (PRALUENT) 75 MG/ML SOAJ Inject 1 pen into the skin every 14 (fourteen) days.  Marland Kitchen amoxicillin (AMOXIL) 500 MG capsule TAKE 4 CAPSULES BY MOUTH ONE HOUR PRIOR TO APPOINTMENT  . BIOTIN PO Take 1 tablet by mouth every morning.   . Calcium Carbonate-Vitamin D (CALCIUM 600+D) 600-400 MG-UNIT per tablet Take 1 tablet by mouth 2 (two) times daily.    Marland Kitchen  carvedilol (COREG) 3.125 MG tablet TAKE 1 TABLET (3.125 MG TOTAL) BY MOUTH 2 (TWO) TIMES DAILY WITH A MEAL.  Marland Kitchen clopidogrel (PLAVIX) 75 MG tablet TAKE 1 TABLET (75 MG TOTAL) BY MOUTH DAILY WITH BREAKFAST.  . cycloSPORINE (RESTASIS) 0.05 % ophthalmic emulsion Place 1 drop into both eyes 2 (two) times daily as needed (DRY EYE).   . Desoximetasone 0.05 % GEL Apply 1 application topically as needed (AS NEEDED TO AFFECTED AREA).   . Glucosamine-Chondroit-Vit C-Mn (GLUCOSAMINE CHONDR 1500 COMPLX) CAPS Take 1 capsule by mouth every morning.   . loratadine (CLARITIN) 10 MG tablet Take 10 mg by mouth daily.    . Multiple Vitamin (MULTIVITAMIN) capsule Take 1 capsule by mouth daily.     . nitroGLYCERIN (NITROSTAT) 0.4 MG SL tablet Place 1 tablet (0.4 mg total) under the tongue every 5 (five) minutes as needed for chest pain.  . pantoprazole (PROTONIX) 40 MG tablet TAKE 1 TABLET BY MOUTH EVERY DAY  . temazepam (RESTORIL) 30 MG capsule TAKE 1 CAPSULE (30 MG TOTAL) BY MOUTH AT BEDTIME AS NEEDED FOR SLEEP.  Marland Kitchen warfarin (COUMADIN) 5 MG tablet TAKE AS DIRECTED BY COUMADIN CLINIC     Allergies:   Latex, Meperidine, Sulfa antibiotics, Crestor [rosuvastatin calcium], Pravastatin, Sulfamethoxazole, and Repatha [evolocumab]   Social History   Tobacco Use  . Smoking status: Never Smoker  . Smokeless tobacco: Never Used  Vaping Use  . Vaping Use: Never used  Substance Use Topics  . Alcohol use: Yes    Alcohol/week: 0.0 standard drinks    Comment: socially  . Drug use: No     Family Hx: The patient's family history includes Heart attack (age of onset: 58) in her mother; Osteoarthritis in her brother.  ROS   EKGs/Labs/Other Test Reviewed:    EKG:  EKG is ordered today.  The ekg ordered today demonstrates normal sinus rhythm, HR 80, inf Q waves, biphasic TW inf leads, early repol, RBBB, PVC, low voltage, QTc 461  Recent Labs: 08/13/2019: ALT 18; Hemoglobin 14.6; Platelet Count 287 09/11/2019: BUN 18; Creatinine, Ser 0.93; NT-Pro BNP 690; Potassium 4.4; Sodium 141   Recent Lipid Panel Lab Results  Component Value Date/Time   CHOL 137 03/20/2019 08:49 AM   TRIG 199 (H) 03/20/2019 08:49 AM   HDL 46 03/20/2019 08:49 AM   CHOLHDL 3.0 03/20/2019 08:49 AM   CHOLHDL 3 09/05/2018 09:03 AM   LDLCALC 58 03/20/2019 08:49 AM   LDLDIRECT 54.0 09/05/2018 09:03 AM    Physical Exam:    VS:  BP (!) 102/58   Pulse 80   Ht 5\' 1"  (1.549 m)   Wt 141 lb 9.6 oz (64.2 kg)   SpO2 96%   BMI 26.76 kg/m     Wt Readings from Last 3 Encounters:  09/11/19 141 lb 9.6 oz (64.2 kg)  08/13/19 143 lb (64.9 kg)  03/15/19 138 lb (62.6 kg)     Constitutional:      Appearance: Healthy  appearance. Not in distress.  Neck:     Thyroid: No thyromegaly.     Vascular: JVD normal.  Pulmonary:     Effort: Pulmonary effort is normal.     Breath sounds: No wheezing. No rales.  Cardiovascular:     Normal rate. Regular rhythm. Normal S1. Normal S2.     Murmurs: There is a grade 2/6 systolic murmur at the URSB.  Edema:    Feet: bilateral trace edema of the feet. Abdominal:     Palpations: Abdomen is  soft. There is no hepatomegaly.  Skin:    General: Skin is warm and dry.  Neurological:     Mental Status: Alert and oriented to person, place and time.     Cranial Nerves: Cranial nerves are intact.       ASSESSMENT & PLAN:    1. Chronic systolic CHF (congestive heart failure) (HCC) EF now 30-35.  Ischemic CM.  NYHA 2-2b.  Low BP has limited med titration in the past.  Dr. Acie Fredrickson had considered Entresto if her BP is high enough. However, I am not convinced she will be able to tolerate Entresto.  I will try her on Losartan 25 mg once daily.  If her pressure runs low with this, we may need to stop it.  I have asked her to contact us if she has symptomatic low BP.  She has some fatigue and this may be due to lack of activity since COVID-19 started.  Her volume status appears stable on exam.  She has some shortness of breath with walking her dog up hills.  This is chronic and stable.  I will get a BMET, BNP. If BNP is high, I will put her on a low dose of Furosemide.  I will get a follow up BMET in 2 weeks.  She will see Dr. Acie Fredrickson in 11/2019 as planned. Continue current dose of Carvedilol.    2. Coronary artery disease involving native coronary artery of native heart without angina pectoris Hx of ant STEMI in 2017 tx with DES to mLAD and BMS to dLAD.  She is not having angina.  Continue clopidogrel, PCSK9 inhibitor.    3. S/P AVR (aortic valve replacement) Mechanical AVR.  Recent echocardiogram with well functioning AVR.  Continue Warfarin, Clopidogrel.    4. Essential  hypertension BP tends to run low.  As noted, I will try to start her on Losartan for CHF.    5. Mixed hyperlipidemia LDL optimal on most recent lab work.  Continue current Rx.      Dispo:  Return in 8 weeks (on 11/07/2019) for Scheduled Follow Up, w/ Dr. Acie Fredrickson.   Medication Adjustments/Labs and Tests Ordered: Current medicines are reviewed at length with the patient today.  Concerns regarding medicines are outlined above.  Tests Ordered: Orders Placed This Encounter  Procedures  . Basic metabolic panel  . Pro b natriuretic peptide (BNP)  . Basic metabolic panel  . EKG 12-Lead   Medication Changes: Meds ordered this encounter  Medications  . losartan (COZAAR) 25 MG tablet    Sig: Take 1 tablet (25 mg total) by mouth daily.    Dispense:  90 tablet    Refill:  1    Signed, Richardson Dopp, PA-C  09/11/2019 9:01 PM    [facility] [address], [place], [place]  [address] Phone: [phone]; Fax: [phone]

## 2019-09-11 ENCOUNTER — Encounter: Payer: Self-pay | Admitting: Physician Assistant

## 2019-09-11 ENCOUNTER — Ambulatory Visit: Payer: Medicare PPO | Admitting: Physician Assistant

## 2019-09-11 ENCOUNTER — Other Ambulatory Visit: Payer: Self-pay

## 2019-09-11 VITALS — BP 102/58 | HR 80 | Ht 61.0 in | Wt 141.6 lb

## 2019-09-11 DIAGNOSIS — I10 Essential (primary) hypertension: Secondary | ICD-10-CM

## 2019-09-11 DIAGNOSIS — Z952 Presence of prosthetic heart valve: Secondary | ICD-10-CM

## 2019-09-11 DIAGNOSIS — I2510 Atherosclerotic heart disease of native coronary artery without angina pectoris: Secondary | ICD-10-CM

## 2019-09-11 DIAGNOSIS — R0602 Shortness of breath: Secondary | ICD-10-CM

## 2019-09-11 DIAGNOSIS — E782 Mixed hyperlipidemia: Secondary | ICD-10-CM

## 2019-09-11 DIAGNOSIS — I5022 Chronic systolic (congestive) heart failure: Secondary | ICD-10-CM | POA: Diagnosis not present

## 2019-09-11 LAB — BASIC METABOLIC PANEL
BUN/Creatinine Ratio: 19 (ref 12–28)
BUN: 18 mg/dL (ref 8–27)
CO2: 25 mmol/L (ref 20–29)
Calcium: 9.2 mg/dL (ref 8.7–10.3)
Chloride: 103 mmol/L (ref 96–106)
Creatinine, Ser: 0.93 mg/dL (ref 0.57–1.00)
GFR calc Af Amer: 71 mL/min/{1.73_m2} (ref >59)
GFR calc non Af Amer: 62 mL/min/{1.73_m2} (ref >59)
Glucose: 89 mg/dL (ref 65–99)
Potassium: 4.4 mmol/L (ref 3.5–5.2)
Sodium: 141 mmol/L (ref 134–144)

## 2019-09-11 LAB — PRO B NATRIURETIC PEPTIDE: NT-Pro BNP: 690 pg/mL — ABNORMAL HIGH (ref 0–301)

## 2019-09-11 MED ORDER — LOSARTAN POTASSIUM 25 MG PO TABS
25.0000 mg | ORAL_TABLET | Freq: Every day | ORAL | 1 refills | Status: DC
Start: 2019-09-11 — End: 2019-11-07

## 2019-09-11 NOTE — Patient Instructions (Addendum)
Medication Instructions:   Your physician has recommended you make the following change in your medication:   1) Start Losartan 25 mg, 1 tablet by mouth once a day  *If you need a refill on your cardiac medications before your next appointment, please call your pharmacy*  Lab Work:  You will have labs drawn today: BMET/BNP  Your physician recommends that you return for lab work in 2 weeks on 09/24/19  Testing/Procedures:  None ordered today  Follow-Up:  Keep follow up with Dr. Acie Fredrickson on 11/07/19  Other Instructions Call if you feel weak, tired, dizzy or systolic blood pressure (top number) drops to 95 or below.

## 2019-09-12 ENCOUNTER — Telehealth: Payer: Self-pay

## 2019-09-12 MED ORDER — FUROSEMIDE 20 MG PO TABS
20.0000 mg | ORAL_TABLET | ORAL | 3 refills | Status: DC
Start: 2019-09-13 — End: 2019-11-07

## 2019-09-12 NOTE — Telephone Encounter (Signed)
The patient has been notified of the result and verbalized understanding.  All questions (if any) were answered. Furosemide 20 mg sent to patients pharmacy, patient aware to take medication 3 times a week and keep follow up lab for 09/24/19. Mady Haagensen, [place] 09/12/2019 11:29 AM

## 2019-09-12 NOTE — Telephone Encounter (Signed)
-----   Message from Liliane Shi, [place] sent at 09/11/2019  4:58 PM EDT ----- Creatinine, potassium normal.  BNP minimally elevated. PLAN:   - Let's try her on low dose Furosemide to see if it helps her symptoms.    - Furosemide 20 mg 3 days a week (not every day)  - She already has a BMET ordered for 2 weeks from now - keep that appt  Richardson Dopp, PA-C    09/11/2019 4:55 PM

## 2019-09-18 ENCOUNTER — Other Ambulatory Visit: Payer: Self-pay | Admitting: Hematology & Oncology

## 2019-09-18 DIAGNOSIS — Z853 Personal history of malignant neoplasm of breast: Secondary | ICD-10-CM

## 2019-09-18 DIAGNOSIS — M1812 Unilateral primary osteoarthritis of first carpometacarpal joint, left hand: Secondary | ICD-10-CM | POA: Diagnosis not present

## 2019-09-24 ENCOUNTER — Ambulatory Visit (INDEPENDENT_AMBULATORY_CARE_PROVIDER_SITE_OTHER): Payer: Medicare PPO | Admitting: *Deleted

## 2019-09-24 ENCOUNTER — Other Ambulatory Visit: Payer: Self-pay

## 2019-09-24 ENCOUNTER — Other Ambulatory Visit: Payer: Medicare PPO

## 2019-09-24 DIAGNOSIS — R0602 Shortness of breath: Secondary | ICD-10-CM

## 2019-09-24 DIAGNOSIS — I10 Essential (primary) hypertension: Secondary | ICD-10-CM | POA: Diagnosis not present

## 2019-09-24 DIAGNOSIS — I5022 Chronic systolic (congestive) heart failure: Secondary | ICD-10-CM

## 2019-09-24 DIAGNOSIS — Z952 Presence of prosthetic heart valve: Secondary | ICD-10-CM

## 2019-09-24 DIAGNOSIS — Z5181 Encounter for therapeutic drug level monitoring: Secondary | ICD-10-CM

## 2019-09-24 LAB — POCT INR: INR: 2.7 (ref 2.0–3.0)

## 2019-09-24 NOTE — Patient Instructions (Signed)
Description   Continue taking 1 tablet daily except 1/2 tablet on Monday, Wednesday, Friday. Recheck in 4 weeks.  [facility] [phone]

## 2019-09-25 LAB — BASIC METABOLIC PANEL
BUN/Creatinine Ratio: 22 (ref 12–28)
BUN: 22 mg/dL (ref 8–27)
CO2: 24 mmol/L (ref 20–29)
Calcium: 9.8 mg/dL (ref 8.7–10.3)
Chloride: 103 mmol/L (ref 96–106)
Creatinine, Ser: 1 mg/dL (ref 0.57–1.00)
GFR calc Af Amer: 65 mL/min/{1.73_m2} (ref >59)
GFR calc non Af Amer: 56 mL/min/{1.73_m2} — ABNORMAL LOW (ref >59)
Glucose: 104 mg/dL — ABNORMAL HIGH (ref 65–99)
Potassium: 4.3 mmol/L (ref 3.5–5.2)
Sodium: 142 mmol/L (ref 134–144)

## 2019-10-03 ENCOUNTER — Ambulatory Visit: Payer: Medicare PPO | Admitting: Family Medicine

## 2019-10-03 ENCOUNTER — Ambulatory Visit (INDEPENDENT_AMBULATORY_CARE_PROVIDER_SITE_OTHER): Payer: Medicare PPO

## 2019-10-03 ENCOUNTER — Other Ambulatory Visit: Payer: Self-pay

## 2019-10-03 ENCOUNTER — Encounter: Payer: Self-pay | Admitting: Family Medicine

## 2019-10-03 VITALS — BP 108/64 | HR 74 | Temp 97.6°F | Ht 61.0 in | Wt 139.0 lb

## 2019-10-03 DIAGNOSIS — Z0000 Encounter for general adult medical examination without abnormal findings: Secondary | ICD-10-CM | POA: Diagnosis not present

## 2019-10-03 DIAGNOSIS — I5022 Chronic systolic (congestive) heart failure: Secondary | ICD-10-CM

## 2019-10-03 DIAGNOSIS — W57XXXA Bitten or stung by nonvenomous insect and other nonvenomous arthropods, initial encounter: Secondary | ICD-10-CM

## 2019-10-03 DIAGNOSIS — R2689 Other abnormalities of gait and mobility: Secondary | ICD-10-CM | POA: Diagnosis not present

## 2019-10-03 DIAGNOSIS — Z7901 Long term (current) use of anticoagulants: Secondary | ICD-10-CM

## 2019-10-03 DIAGNOSIS — I252 Old myocardial infarction: Secondary | ICD-10-CM | POA: Diagnosis not present

## 2019-10-03 DIAGNOSIS — E785 Hyperlipidemia, unspecified: Secondary | ICD-10-CM

## 2019-10-03 DIAGNOSIS — Z780 Asymptomatic menopausal state: Secondary | ICD-10-CM

## 2019-10-03 DIAGNOSIS — S70362A Insect bite (nonvenomous), left thigh, initial encounter: Secondary | ICD-10-CM

## 2019-10-03 NOTE — Progress Notes (Signed)
Subjective:     Amanda Wiggins is a 74 y.o. female and is here for a comprehensive physical exam. The patient reports problems - Balance off.  Patient denies dizziness but notes her balance is "off" and she has no energy.  Patient states her medications have been adjusted by cardiology, Dr. Acie Fredrickson.  Patient was taking Lasix 20 mg 3 times per week but it was causing her to feel bad.  Also taking Coreg 3.125 and losartan 25 mg daily.  BP at home typically 97-98/60s.  Patient had both doses of Covid vaccine on February 2 and March 4. Mammogram done 08/22/2019.  Colonoscopy up-to-date.  Patient has AWV visit with nurse today.  Patient had labs last week. Social History   Socioeconomic History  . Marital status: Widowed    Spouse name: Not on file  . Number of children: Not on file  . Years of education: Not on file  . Highest education level: Not on file  Occupational History  . Not on file  Tobacco Use  . Smoking status: Never Smoker  . Smokeless tobacco: Never Used  Vaping Use  . Vaping Use: Never used  Substance and Sexual Activity  . Alcohol use: Yes    Alcohol/week: 0.0 standard drinks    Comment: socially  . Drug use: No  . Sexual activity: Not on file  Other Topics Concern  . Not on file  Social History Narrative  . Not on file   Social Determinants of Health   Financial Resource Strain:   . Difficulty of Paying Living Expenses:   Food Insecurity:   . Worried About Charity fundraiser in the Last Year:   . Arboriculturist in the Last Year:   Transportation Needs:   . Film/video editor (Medical):   Marland Kitchen Lack of Transportation (Non-Medical):   Physical Activity:   . Days of Exercise per Week:   . Minutes of Exercise per Session:   Stress:   . Feeling of Stress :   Social Connections:   . Frequency of Communication with Friends and Family:   . Frequency of Social Gatherings with Friends and Family:   . Attends Religious Services:   . Active Member of Clubs or  Organizations:   . Attends Archivist Meetings:   Marland Kitchen Marital Status:   Intimate Partner Violence:   . Fear of Current or Ex-Partner:   . Emotionally Abused:   Marland Kitchen Physically Abused:   . Sexually Abused:    Health Maintenance  Topic Date Due  . Fecal DNA (Cologuard)  07/29/2019  . INFLUENZA VACCINE  10/06/2019  . MAMMOGRAM  08/21/2021  . TETANUS/TDAP  02/24/2026  . DEXA SCAN  Completed  . COVID-19 Vaccine  Completed  . Hepatitis C Screening  Completed  . PNA vac Low Risk Adult  Completed    The following portions of the patient's history were reviewed and updated as appropriate: allergies, current medications, past family history, past medical history, past social history, past surgical history and problem list.  Review of Systems Pertinent items noted in HPI and remainder of comprehensive ROS otherwise negative.   Objective:    BP (!) 108/64 (BP Location: Right Arm, Patient Position: Sitting, Cuff Size: Normal)   Pulse 74   Temp 97.6 F (36.4 C) (Oral)   Ht 5\' 1"  (1.549 m)   Wt 139 lb (63 kg)   SpO2 97%   BMI 26.26 kg/m  General appearance: alert, cooperative, fatigued and no  distress Head: Normocephalic, without obvious abnormality, atraumatic Eyes: conjunctivae/corneas clear. PERRL, EOM's intact. Fundi benign. Ears: normal TM's and external ear canals both ears Nose: Nares normal. Septum midline. Mucosa normal. No drainage or sinus tenderness. Throat: lips, mucosa, and tongue normal; teeth and gums normal Neck: no adenopathy, no carotid bruit, no JVD, supple, symmetrical, trachea midline and thyroid not enlarged, symmetric, no tenderness/mass/nodules Lungs: clear to auscultation bilaterally Heart: regular rate and rhythm, S1, S2 normal, no murmur, click, rub or gallop Abdomen: soft, non-tender; bowel sounds normal; no masses,  no organomegaly Extremities: extremities normal, atraumatic, no cyanosis or edema Pulses: 2+ and symmetric Skin: Skin color, texture,  turgor normal. No rashes or lesions Lymph nodes: Cervical, supraclavicular, and axillary nodes normal. Neurologic: Alert and oriented X 3, normal strength and tone. Normal symmetric reflexes. Normal coordination and gait    Gen. Pleasant, well-nourished, in no distress, normal affect   HEENT: Mower/AT, face symmetric, conjunctiva clear, no scleral icterus, PERRLA, EOMI, nares patent without drainage, pharynx without erythema or exudate.  TMs normal b/l. Neck: No JVD, no thyromegaly, no carotid bruits Lungs: no accessory muscle use, CTAB, no wheezes or rales Cardiovascular: RRR, 2/6 murmur heard throughout,  Trace edema in ankles b/l. Abdomen: BS present, soft, NT/ND, no hepatosplenomegaly. Musculoskeletal: No deformities, no cyanosis or clubbing, normal tone Neuro:  A&Ox3, CN II-XII intact, normal gait Skin:  Warm, no lesions/ rash.  Areas of ecchymosis on UEs.  L medial thigh proximal with a erythematous papule with central punctum, no drainage, yellow discoloration of skin surrounding lesion.   Assessment:    Healthy female exam.      Plan:     Anticipatory guidance given including wearing seatbelts, smoke detectors in the home, increasing physical activity, increasing p.o. intake of water and vegetables. -No labs ordered at this visit as patient had recent labs last week with other providers.  Results reviewed in chart -Colonoscopy up-to-date.  Done 08/26/2016 -Mammogram done 08/22/2019 -Immunizations up-to-date -Given handout -Patient has appointment with AWV nurse today. -Next CPE in 1 year See After Visit Summary for Counseling Recommendations   Well adult exam  Chronic systolic CHF (congestive heart failure) (HCC) -Stable -Continue current medications including Coreg 3.125 mg twice daily, losartan 25 mg. -Discussed follow-up with cardiology as Lasix 20 mg 3 times per week given patient's symptoms  History of ST elevation myocardial infarction (STEMI) -Continue anticoagulation,  BP control, lifestyle modifications -Continue follow-up with cardiology, Dr. Acie Fredrickson  Hyperlipidemia, unspecified hyperlipidemia type -Triglycerides 199 03/20/2019 -Discussed lifestyle modifications  Long term current use of anticoagulant therapy -Continue Coumadin  Balance problem -Discussed possible causes including hypertension -Patient encouraged to check BP at home, increase p.o. intake of fluids -Follow-up with cardiology -For continued or worsening symptoms consider additional labs including TSH and possible imaging of brain.  Tick bite of left thigh, initial encounter -Healing -Discussed prolonged resolution may be caused if part of the tick was retained. -Continue to monitor   [name], MD

## 2019-10-03 NOTE — Patient Instructions (Signed)
[name] , Thank you for taking time to come for your Medicare Wellness Visit. I appreciate your ongoing commitment to your health goals. Please review the following plan we discussed and let me know if I can assist you in the future.   Screening recommendations/referrals: Colonoscopy: Up to date, next due 08/27/2026 Mammogram: Up to date, next due 08/22/2019 Bone Density: Currently due, orders placed this visit Recommended yearly ophthalmology/optometry visit for glaucoma screening and checkup Recommended yearly dental visit for hygiene and checkup  Vaccinations: Influenza vaccine: Up to date, next due 10/2019 Pneumococcal vaccine: Completed series  Tdap vaccine: Up to date, next due 02/24/2026 Shingles vaccine: Completed series     Advanced directives: Please bring a copy to your next office visit  Conditions/risks identified: None   Next appointment: None    Preventive Care 73 Years and Older, Female Preventive care refers to lifestyle choices and visits with your health care provider that can promote health and wellness. What does preventive care include?  A yearly physical exam. This is also called an annual well check.  Dental exams once or twice a year.  Routine eye exams. Ask your health care provider how often you should have your eyes checked.  Personal lifestyle choices, including:  Daily care of your teeth and gums.  Regular physical activity.  Eating a healthy diet.  Avoiding tobacco and drug use.  Limiting alcohol use.  Practicing safe sex.  Taking low-dose aspirin every day.  Taking vitamin and mineral supplements as recommended by your health care provider. What happens during an annual well check? The services and screenings done by your health care provider during your annual well check will depend on your age, overall health, lifestyle risk factors, and family history of disease. Counseling  Your health care provider may ask you questions  about your:  Alcohol use.  Tobacco use.  Drug use.  Emotional well-being.  Home and relationship well-being.  Sexual activity.  Eating habits.  History of falls.  Memory and ability to understand (cognition).  Work and work Statistician.  Reproductive health. Screening  You may have the following tests or measurements:  Height, weight, and BMI.  Blood pressure.  Lipid and cholesterol levels. These may be checked every 5 years, or more frequently if you are over 19 years old.  Skin check.  Lung cancer screening. You may have this screening every year starting at age 59 if you have a 30-pack-year history of smoking and currently smoke or have quit within the past 15 years.  Fecal occult blood test (FOBT) of the stool. You may have this test every year starting at age 75.  Flexible sigmoidoscopy or colonoscopy. You may have a sigmoidoscopy every 5 years or a colonoscopy every 10 years starting at age 23.  Hepatitis C blood test.  Hepatitis B blood test.  Sexually transmitted disease (STD) testing.  Diabetes screening. This is done by checking your blood sugar (glucose) after you have not eaten for a while (fasting). You may have this done every 1-3 years.  Bone density scan. This is done to screen for osteoporosis. You may have this done starting at age 43.  Mammogram. This may be done every 1-2 years. Talk to your health care provider about how often you should have regular mammograms. Talk with your health care provider about your test results, treatment options, and if necessary, the need for more tests. Vaccines  Your health care provider may recommend certain vaccines, such as:  Influenza vaccine. This  is recommended every year.  Tetanus, diphtheria, and acellular pertussis (Tdap, Td) vaccine. You may need a Td booster every 10 years.  Zoster vaccine. You may need this after age 43.  Pneumococcal 13-valent conjugate (PCV13) vaccine. One dose is  recommended after age 69.  Pneumococcal polysaccharide (PPSV23) vaccine. One dose is recommended after age 58. Talk to your health care provider about which screenings and vaccines you need and how often you need them. This information is not intended to replace advice given to you by your health care provider. Make sure you discuss any questions you have with your health care provider. Document Released: 03/20/2015 Document Revised: 11/11/2015 Document Reviewed: 12/23/2014 Elsevier Interactive Patient Education  2017 [place] Prevention in the Home Falls can cause injuries. They can happen to people of all ages. There are many things you can do to make your home safe and to help prevent falls. What can I do on the outside of my home?  Regularly fix the edges of walkways and driveways and fix any cracks.  Remove anything that might make you trip as you walk through a door, such as a raised step or threshold.  Trim any bushes or trees on the path to your home.  Use bright outdoor lighting.  Clear any walking paths of anything that might make someone trip, such as rocks or tools.  Regularly check to see if handrails are loose or broken. Make sure that both sides of any steps have handrails.  Any raised decks and porches should have guardrails on the edges.  Have any leaves, snow, or ice cleared regularly.  Use sand or salt on walking paths during winter.  Clean up any spills in your garage right away. This includes oil or grease spills. What can I do in the bathroom?  Use night lights.  Install grab bars by the toilet and in the tub and shower. Do not use towel bars as grab bars.  Use non-skid mats or decals in the tub or shower.  If you need to sit down in the shower, use a plastic, non-slip stool.  Keep the floor dry. Clean up any water that spills on the floor as soon as it happens.  Remove soap buildup in the tub or shower regularly.  Attach bath mats  securely with double-sided non-slip rug tape.  Do not have throw rugs and other things on the floor that can make you trip. What can I do in the bedroom?  Use night lights.  Make sure that you have a light by your bed that is easy to reach.  Do not use any sheets or blankets that are too big for your bed. They should not hang down onto the floor.  Have a firm chair that has side arms. You can use this for support while you get dressed.  Do not have throw rugs and other things on the floor that can make you trip. What can I do in the kitchen?  Clean up any spills right away.  Avoid walking on wet floors.  Keep items that you use a lot in easy-to-reach places.  If you need to reach something above you, use a strong step stool that has a grab bar.  Keep electrical cords out of the way.  Do not use floor polish or wax that makes floors slippery. If you must use wax, use non-skid floor wax.  Do not have throw rugs and other things on the floor that can make you  trip. What can I do with my stairs?  Do not leave any items on the stairs.  Make sure that there are handrails on both sides of the stairs and use them. Fix handrails that are broken or loose. Make sure that handrails are as long as the stairways.  Check any carpeting to make sure that it is firmly attached to the stairs. Fix any carpet that is loose or worn.  Avoid having throw rugs at the top or bottom of the stairs. If you do have throw rugs, attach them to the floor with carpet tape.  Make sure that you have a light switch at the top of the stairs and the bottom of the stairs. If you do not have them, ask someone to add them for you. What else can I do to help prevent falls?  Wear shoes that:  Do not have high heels.  Have rubber bottoms.  Are comfortable and fit you well.  Are closed at the toe. Do not wear sandals.  If you use a stepladder:  Make sure that it is fully opened. Do not climb a closed  stepladder.  Make sure that both sides of the stepladder are locked into place.  Ask someone to hold it for you, if possible.  Clearly mark and make sure that you can see:  Any grab bars or handrails.  First and last steps.  Where the edge of each step is.  Use tools that help you move around (mobility aids) if they are needed. These include:  Canes.  Walkers.  Scooters.  Crutches.  Turn on the lights when you go into a dark area. Replace any light bulbs as soon as they burn out.  Set up your furniture so you have a clear path. Avoid moving your furniture around.  If any of your floors are uneven, fix them.  If there are any pets around you, be aware of where they are.  Review your medicines with your doctor. Some medicines can make you feel dizzy. This can increase your chance of falling. Ask your doctor what other things that you can do to help prevent falls. This information is not intended to replace advice given to you by your health care provider. Make sure you discuss any questions you have with your health care provider. Document Released: 12/18/2008 Document Revised: 07/30/2015 Document Reviewed: 03/28/2014 Elsevier Interactive Patient Education  2017 [employer].

## 2019-10-03 NOTE — Patient Instructions (Signed)
Preventive Care 73 Years and Older, Female Preventive care refers to lifestyle choices and visits with your health care provider that can promote health and wellness. This includes:  A yearly physical exam. This is also called an annual well check.  Regular dental and eye exams.  Immunizations.  Screening for certain conditions.  Healthy lifestyle choices, such as diet and exercise. What can I expect for my preventive care visit? Physical exam Your health care provider will check:  Height and weight. These may be used to calculate body mass index (BMI), which is a measurement that tells if you are at a healthy weight.  Heart rate and blood pressure.  Your skin for abnormal spots. Counseling Your health care provider may ask you questions about:  Alcohol, tobacco, and drug use.  Emotional well-being.  Home and relationship well-being.  Sexual activity.  Eating habits.  History of falls.  Memory and ability to understand (cognition).  Work and work Statistician.  Pregnancy and menstrual history. What immunizations do I need?  Influenza (flu) vaccine  This is recommended every year. Tetanus, diphtheria, and pertussis (Tdap) vaccine  You may need a Td booster every 10 years. Varicella (chickenpox) vaccine  You may need this vaccine if you have not already been vaccinated. Zoster (shingles) vaccine  You may need this after age 73. Pneumococcal conjugate (PCV13) vaccine  One dose is recommended after age 73. Pneumococcal polysaccharide (PPSV23) vaccine  One dose is recommended after age 72. Measles, mumps, and rubella (MMR) vaccine  You may need at least one dose of MMR if you were born in 1957 or later. You may also need a second dose. Meningococcal conjugate (MenACWY) vaccine  You may need this if you have certain conditions. Hepatitis A vaccine  You may need this if you have certain conditions or if you travel or work in places where you may be exposed  to hepatitis A. Hepatitis B vaccine  You may need this if you have certain conditions or if you travel or work in places where you may be exposed to hepatitis B. Haemophilus influenzae type b (Hib) vaccine  You may need this if you have certain conditions. You may receive vaccines as individual doses or as more than one vaccine together in one shot (combination vaccines). Talk with your health care provider about the risks and benefits of combination vaccines. What tests do I need? Blood tests  Lipid and cholesterol levels. These may be checked every 5 years, or more frequently depending on your overall health.  Hepatitis C test.  Hepatitis B test. Screening  Lung cancer screening. You may have this screening every year starting at age 73 if you have a 30-pack-year history of smoking and currently smoke or have quit within the past 15 years.  Colorectal cancer screening. All adults should have this screening starting at age 73 and continuing until age 15. Your health care provider may recommend screening at age 23 if you are at increased risk. You will have tests every 1-10 years, depending on your results and the type of screening test.  Diabetes screening. This is done by checking your blood sugar (glucose) after you have not eaten for a while (fasting). You may have this done every 1-3 years.  Mammogram. This may be done every 1-2 years. Talk with your health care provider about how often you should have regular mammograms.  BRCA-related cancer screening. This may be done if you have a family history of breast, ovarian, tubal, or peritoneal cancers.  Other tests  Sexually transmitted disease (STD) testing.  Bone density scan. This is done to screen for osteoporosis. You may have this done starting at age 73. Follow these instructions at home: Eating and drinking  Eat a diet that includes fresh fruits and vegetables, whole grains, lean protein, and low-fat dairy products. Limit  your intake of foods with high amounts of sugar, saturated fats, and salt.  Take vitamin and mineral supplements as recommended by your health care provider.  Do not drink alcohol if your health care provider tells you not to drink.  If you drink alcohol: ? Limit how much you have to 0-1 drink a day. ? Be aware of how much alcohol is in your drink. In the [place] one drink equals one 12 oz bottle of beer (355 mL), one 5 oz glass of wine (148 mL), or one 1 oz glass of hard liquor (44 mL). Lifestyle  Take daily care of your teeth and gums.  Stay active. Exercise for at least 30 minutes on 5 or more days each week.  Do not use any products that contain nicotine or tobacco, such as cigarettes, e-cigarettes, and chewing tobacco. If you need help quitting, ask your health care provider.  If you are sexually active, practice safe sex. Use a condom or other form of protection in order to prevent STIs (sexually transmitted infections).  Talk with your health care provider about taking a low-dose aspirin or statin. What's next?  Go to your health care provider once a year for a well check visit.  Ask your health care provider how often you should have your eyes and teeth checked.  Stay up to date on all vaccines. This information is not intended to replace advice given to you by your health care provider. Make sure you discuss any questions you have with your health care provider. Document Revised: 02/15/2018 Document Reviewed: 02/15/2018 Elsevier Patient Education  2020 [place], Adult  Ticks are insects that can bite. Most ticks live in shrubs and grassy areas. They climb onto people and animals that go by. Then they bite. Some ticks carry germs that can make you sick. How can I prevent tick bites?  Use an insect repellent that has 20% or higher of the ingredients DEET, picaridin, or IR3535. Put this insect repellent on: ? Bare skin. ? The tops of your  boots. ? Your pant legs. ? The ends of your sleeves.  If you use an insect repellent that has the ingredient permethrin, make sure to follow the instructions on the bottle. Treat the following: ? Clothing. ? Supplies. ? Boots. ? Tents.  Wear long sleeves, long pants, and light colors.  Tuck your pant legs into your socks.  Stay in the middle of the trail.  Try not to walk through long grass.  Before going inside your house, check your clothes, hair, and skin for ticks. Make sure to check your head, neck, armpits, waist, groin, and joint areas.  Check for ticks every day.  When you come indoors: ? Wash your clothes right away. ? Shower right away. ? Dry your clothes in a dryer on high heat for 60 minutes or more. What is the right way to remove a tick? Remove a tick from your skin as soon as possible.  To remove a tick that is crawling on your skin: ? Go outdoors and brush the tick off. ? Use tape or a lint roller.  To remove a tick that  is biting: ? Wash your hands. ? If you have latex gloves, put them on. ? Use tweezers, curved forceps, or a tick-removal tool to grasp the tick. Grasp the tick as close to your skin and as close to the tick's head as possible. ? Gently pull up until the tick lets go.  Try to keep the tick's head attached to its body.  Do not twist or jerk the tick.  Do not squeeze or crush the tick. Do not try to remove a tick with heat, alcohol, petroleum jelly, or fingernail polish. How should I get rid of a tick? Here are some ways to get rid of a tick that is alive:  Place the tick in rubbing alcohol.  Place the tick in a bag or container you can close tightly.  Wrap the tick tightly in tape.  Flush the tick down the toilet. Contact a doctor if:  You have symptoms of a disease, such as: ? Pain in a muscle, joint, or bone. ? Trouble walking or moving your legs. ? Numbness in your legs. ? Inability to move (paralysis). ? A red rash that  makes a circle (bull's-eye rash). ? Redness and swelling where the tick bit you. ? A fever. ? Throwing up (vomiting) over and over. ? Diarrhea. ? Weight loss. ? Tender and swollen lymph glands. ? Shortness of breath. ? Cough. ? Belly pain (abdominal pain). ? Headache. ? Being more tired than normal. ? A change in how alert (conscious) you are. ? Confusion. Get help right away if:  You cannot remove a tick.  A part of a tick breaks off and gets stuck in your skin.  You are feeling worse. Summary  Ticks may carry germs that can make you sick.  To prevent tick bites, wear long sleeves, long pants, and light colors. Use insect repellent. Follow the instructions on the bottle.  If the tick is biting, do not try to remove it with heat, alcohol, petroleum jelly, or fingernail polish.  Use tweezers, curved forceps, or a tick-removal tool to grasp the tick. Gently pull up until the tick lets go. Do not twist or jerk the tick. Do not squeeze or crush the tick.  If you have symptoms, contact a doctor. This information is not intended to replace advice given to you by your health care provider. Make sure you discuss any questions you have with your health care provider. Document Revised: 02/05/2018 Document Reviewed: 06/03/2016 Elsevier Patient Education  2020 [place].  Heart Failure Medicines  Heart failure is a condition in which the heart cannot pump enough blood through the body. This can cause symptoms such as shortness of breath, fatigue, and confusion. There are two types of heart failure:  Heart failure with reduced ejection fraction. In this type, the heart muscle is weak.  Heart failure with preserved ejection fraction. In this type, the heart muscle does not fill with blood properly and may be stiff. There is no cure for heart failure. However, being treated with medicines and following your health care provider's instructions about a healthy lifestyle can help you  stay active, avoid problems, and live longer. Talk to your health care provider about all medicines that you are taking, how often you should take them, and what possible problems (side effects) they may cause. Talk with your health care provider if you have difficulty affording your medicines. What are some common medicines for heart failure? The medicines that are prescribed for you will depend on your  symptoms, the type of heart failure you have, and the cause of your heart failure. In some cases, you may need to take more than one medicine. You will be prescribed the following medicines according to your type of heart failure: Heart failure with reduced ejection fraction  Beta-blockers.  Angiotensin-converting enzyme (ACE) inhibitors.  Angiotensin II receptor blockers (ARBs).  Angiotensin receptor neprilysin inhibitors (ARNIs).  Aldosterone antagonists.  Diuretics.  Digoxin.  Nitrates. Heart failure with preserved ejection fraction  Medicines to control blood pressure, including: ? Beta-blockers. ? Angiotensin-converting enzyme (ACE) inhibitors. ? Angiotensin II receptor blockers (ARBs).  Diuretics.  Aldosterone antagonists. What should I know about beta-blockers?  These medicines lower your blood pressure and slow your heart rate. This helps to lessen your heart's workload.  They can help to relieve chest pain (angina).  They can help to improve your heart's ability to pump.  They may cause asthma attacks and shortness of breath.  Because these medicines slow your heart rate, it is important not to overwork yourself while exercising. Talk to your health care provider about what your target heart rate should be while you exercise.  These medicines can hide the symptoms of low blood sugar (glucose), which is also called hypoglycemia. If you have diabetes, make sure to check your blood glucose carefully. If you have hypoglycemia, talk to your health care provider about  adjusting your medicines.  Beta-blockers may make you feel dizzy or light-headed at first. Do not drive or use heavy machinery when you first start these medicines. Ask your health care provider when it is safe for you to drive. What should I know about ACE inhibitors or ARBs?  These medicines help to widen arteries and veins. This action lowers your blood pressure and lessens the strain on your heart, making it easier for your heart to pump.  They can help to lessen the symptoms of heart failure.  ARBs are often used if a person cannot take ACE inhibitors.  ACE inhibitors may cause a dry cough.  In rare cases, ACE inhibitors and ARBs can cause swelling of the tongue or lips, other swelling, taste problems, and skin rashes. If these symptoms occur, stop taking the medicines and contact your health care provider.  Do not take ACE inhibitors if you are pregnant or may become pregnant. These medicines can cause health problems in an unborn baby.  These medicines may cause dizziness. You may need regular checkups and blood tests to monitor how they are working. What should I know about ARNIs?  These medicines are a combination of an ARB and another medicine. They lower your blood pressure.  Side effects may include dry cough, dizziness, low blood pressure, and kidney problems.  Do not take ARNIs if you are already taking ACE inhibitors or ARBs.  You may notice increased urination when taking these medicines.  ARNIs can raise the amount of potassium in the blood. Your potassium levels will be monitored regularly by your health care provider. What should I know about aldosterone antagonists?  They help the body to remove excess sodium through urination. This helps to lessen the amount of blood that the heart needs to pump.  They can also help to lower blood pressure and improve the heart's ability to pump blood.  They may cause dizziness, diarrhea, coughing, or flu-like symptoms.  They  should not be used if you have type 2 diabetes.  They can raise the amount of potassium in the blood. Your potassium levels will be monitored  regularly by your health care provider.  These medicines can make men's breasts large and tender. What should I know about diuretics?  Diuretics are medicines that help the body get rid of excess fluid through urination. They can also help lessen your heart's workload.  They help to lessen fluid buildup in the lungs, ankles, and feet.  They help to lower your blood pressure.  They can worsen problems with controlling urination (urinary incontinence).  They may cause dizziness, headaches, muscle cramps, and an upset stomach.  They can cause weak muscles, dry mouth, or confusion. It is important to drink plenty of fluids while taking these medicines, especially while exercising or on hot days. What should I know about digoxin?  Digoxin helps the heart pump more blood efficiently. It also lowers your heart rate.  It can help ease heart failure symptoms and may be used if other medicines do not work.  It can also help with irregular heartbeat (arrhythmia).  It may cause stomach problems, fatigue, headache, drowsiness, or vision problems. What should I know about nitrates?  Nitrates relax the blood vessels and increase oxygen and blood supply to the heart. They also lower the blood pressure.  They are usually taken to lessen chest pain.  They may cause headaches, flushing, or irregular heartbeat. Summary  A healthy lifestyle and treatment with medicine will relieve symptoms of heart failure.  In some cases, you may need to take more than one medicine.  It is important to talk to your health care provider about how often you should take your medicines. Do not skip a dose or change your dosage.  Talk to your health care provider about possible side effects of these medicines. This information is not intended to replace advice given to you by  your health care provider. Make sure you discuss any questions you have with your health care provider. Document Revised: 03/08/2017 Document Reviewed: 07/08/2016 Elsevier Patient Education  [place].

## 2019-10-03 NOTE — Progress Notes (Signed)
Subjective:   Amanda Wiggins is a 74 y.o. female who presents for Medicare Annual (Subsequent) preventive examination.  Review of Systems    N/A Cardiac Risk Factors include: advanced age (>21men, >79 women);dyslipidemia;hypertension     Objective:    There were no vitals filed for this visit. There is no height or weight on file to calculate BMI.  Advanced Directives 10/03/2019 08/13/2019 08/13/2018 02/12/2018 01/22/2018 08/14/2017 08/15/2016  Does Patient Have a Medical Advance Directive? Yes No Yes Yes Yes Yes Yes  Type of Paramedic of [place];Living will - [place];Living will [place];Living will [place];Living will [place];Living will [place];Living will  Does patient want to make changes to medical advance directive? No - Patient declined No - Patient declined No - Patient declined No - Patient declined - - -  Copy of [place] in Chart? No - copy requested - No - copy requested No - copy requested - - -  Would patient like information on creating a medical advance directive? - No - Patient declined - - - - -    Current Medications (verified) Outpatient Encounter Medications as of 10/03/2019  Medication Sig  . Alirocumab (PRALUENT) 75 MG/ML SOAJ Inject 1 pen into the skin every 14 (fourteen) days.  Marland Kitchen amoxicillin (AMOXIL) 500 MG capsule TAKE 4 CAPSULES BY MOUTH ONE HOUR PRIOR TO APPOINTMENT  . BIOTIN PO Take 1 tablet by mouth every morning.   . Calcium Carbonate-Vitamin D (CALCIUM 600+D) 600-400 MG-UNIT per tablet Take 1 tablet by mouth 2 (two) times daily.    . carvedilol (COREG) 3.125 MG tablet TAKE 1 TABLET (3.125 MG TOTAL) BY MOUTH 2 (TWO) TIMES DAILY WITH A MEAL.  Marland Kitchen clopidogrel (PLAVIX) 75 MG tablet TAKE 1 TABLET (75 MG TOTAL) BY MOUTH DAILY WITH BREAKFAST.  . cycloSPORINE (RESTASIS) 0.05 % ophthalmic emulsion Place 1 drop into both  eyes 2 (two) times daily as needed (DRY EYE).   . Desoximetasone 0.05 % GEL Apply 1 application topically as needed (AS NEEDED TO AFFECTED AREA).   . furosemide (LASIX) 20 MG tablet Take 1 tablet (20 mg total) by mouth 3 (three) times a week.  . Glucosamine-Chondroit-Vit C-Mn (GLUCOSAMINE CHONDR 1500 COMPLX) CAPS Take 1 capsule by mouth every morning.   . loratadine (CLARITIN) 10 MG tablet Take 10 mg by mouth daily.    Marland Kitchen losartan (COZAAR) 25 MG tablet Take 1 tablet (25 mg total) by mouth daily.  . Multiple Vitamin (MULTIVITAMIN) capsule Take 1 capsule by mouth daily.    . nitroGLYCERIN (NITROSTAT) 0.4 MG SL tablet Place 1 tablet (0.4 mg total) under the tongue every 5 (five) minutes as needed for chest pain.  . pantoprazole (PROTONIX) 40 MG tablet TAKE 1 TABLET BY MOUTH EVERY DAY  . temazepam (RESTORIL) 30 MG capsule TAKE 1 CAPSULE (30 MG TOTAL) BY MOUTH AT BEDTIME AS NEEDED FOR SLEEP.  Marland Kitchen warfarin (COUMADIN) 5 MG tablet TAKE AS DIRECTED BY COUMADIN CLINIC   No facility-administered encounter medications on file as of 10/03/2019.    Allergies (verified) Latex, Meperidine, Sulfa antibiotics, Crestor [rosuvastatin calcium], Pravastatin, Sulfamethoxazole, and Repatha [evolocumab]   History: Past Medical History:  Diagnosis Date  . Aortic valve replaced   . Breast cancer ([place]) 2005  . FHx: migraine headaches   . GERD (gastroesophageal reflux disease)   . Hiatal hernia   . Hx Breast Cancer, IDC, Stage I 07/03/2003  .  Hx of breast cancer   . Hyperlipidemia   . Hypertension   . Menopausal syndrome   . Osteopenia   . 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 Personal history of radiation therapy   . Status post aortic valve repair    Past Surgical History:  Procedure Laterality Date  . AORTIC VALVE REPLACEMENT  2003  . BREAST BIOPSY    . BREAST LUMPECTOMY Left 2005  . CARDIAC CATHETERIZATION     Ejection Fraction   . CARDIAC CATHETERIZATION N/A 05/16/2015   Procedure: Left  Heart Cath and Coronary Angiography;  Surgeon: Leonie Man, MD;  Location: [facility];  Service: Cardiovascular;  Laterality: N/A;  . CARDIAC CATHETERIZATION  05/16/2015   Procedure: Coronary Stent Intervention;  Surgeon: Leonie Man, MD;  Location: [facility];  Service: Cardiovascular;;  . CARDIAC CATHETERIZATION N/A 05/19/2015   Procedure: Left Heart Cath and Coronary Angiography;  Surgeon: Troy Sine, MD;  Location: [facility];  Service: Cardiovascular;  Laterality: N/A;  . CARDIAC CATHETERIZATION N/A 05/19/2015   Procedure: Coronary Stent Intervention;  Surgeon: Troy Sine, MD;  Location: [facility];  Service: Cardiovascular;  Laterality: N/A;  . HIATAL HERNIA REPAIR    . HIATAL HERNIA REPAIR  05/11/2007  . hysterectomy, endometiral cancer  2001  . lummpectomy breast cancer  07/03/2003  . nissan fundoplicaiton  [id number]   post-op hematoma on heparin   . right shouler surgery     Family History  Problem Relation Age of Onset  . Heart attack Mother 54  . Osteoarthritis Brother        hpercholesterolemia   Social History   Socioeconomic History  . Marital status: Widowed    Spouse name: Not on file  . Number of children: Not on file  . Years of education: Not on file  . Highest education level: Not on file  Occupational History  . Not on file  Tobacco Use  . Smoking status: Never Smoker  . Smokeless tobacco: Never Used  Vaping Use  . Vaping Use: Never used  Substance and Sexual Activity  . Alcohol use: Yes    Alcohol/week: 0.0 standard drinks    Comment: socially  . Drug use: No  . Sexual activity: Not on file  Other Topics Concern  . Not on file  Social History Narrative  . Not on file   Social Determinants of Health   Financial Resource Strain: Low Risk   . Difficulty of Paying Living Expenses: Not hard at all  Food Insecurity: No Food Insecurity  . Worried About Charity fundraiser in the Last Year: Never true  . Ran Out  of Food in the Last Year: Never true  Transportation Needs: No Transportation Needs  . Lack of Transportation (Medical): No  . Lack of Transportation (Non-Medical): No  Physical Activity: Insufficiently Active  . Days of Exercise per Week: 3 days  . Minutes of Exercise per Session: 30 min  Stress: No Stress Concern Present  . Feeling of Stress : Not at all  Social Connections: Moderately Integrated  . Frequency of Communication with Friends and Family: More than three times a week  . Frequency of Social Gatherings with Friends and Family: More than three times a week  . Attends Religious Services: More than 4 times per year  . Active Member of Clubs or Organizations: Yes  . Attends Archivist Meetings: More than 4 times per year  .  Marital Status: Widowed    Tobacco Counseling Counseling given: Not Answered   Clinical Intake:  Pre-visit preparation completed: Yes  Pain : No/denies pain     Nutritional Status: BMI 25 -29 Overweight Nutritional Risks: None Diabetes: No  How often do you need to have someone help you when you read instructions, pamphlets, or other written materials from your doctor or pharmacy?: 1 - Never What is the last grade level you completed in school?: Masters Degree  Diabetic?No  Interpreter Needed?: No  Information entered by :: [facility] of Daily Living In your present state of health, do you have any difficulty performing the following activities: 10/03/2019  Hearing? Y  Comment Right hearing loss  Vision? N  Difficulty concentrating or making decisions? N  Walking or climbing stairs? N  Dressing or bathing? N  Doing errands, shopping? N  Preparing Food and eating ? N  Using the Toilet? N  In the past six months, have you accidently leaked urine? N  Do you have problems with loss of bowel control? N  Managing your Medications? N  Managing your Finances? N  Housekeeping or managing your Housekeeping? N  Some  recent data might be hidden    Patient Care Team: Billie Ruddy, MD as PCP - General (Family Medicine) Nahser, Wonda Cheng, MD as PCP - Cardiology (Cardiology) Izora Gala, MD as Consulting Physician (Otolaryngology) Volanda Napoleon, MD as Consulting Physician (Oncology)  Indicate any recent Medical Services you may have received from other than [facility] providers in the past year (date may be approximate).     Assessment:   This is a routine wellness examination for Eldred.  Hearing/Vision screen  Hearing Screening   125Hz  250Hz  500Hz  1000Hz  2000Hz  3000Hz  4000Hz  6000Hz  8000Hz   Right ear:           Left ear:           Vision Screening Comments: Gets eyes examined yearly   Dietary issues and exercise activities discussed: Current Exercise Habits: Home exercise routine, Type of exercise: walking, Time (Minutes): 30, Frequency (Times/Week): 3, Weekly Exercise (Minutes/Week): 90, Intensity: Mild, Exercise limited by: cardiac condition(s)  Goals   None    Depression Screen PHQ 2/9 Scores 10/03/2019 09/05/2018 07/17/2017 09/25/2015 06/17/2015 06/20/2013  PHQ - 2 Score 0 0 0 0 1 0  PHQ- 9 Score 0 - - 1 4 -  Exception Documentation - - - (No Data) (No Data) -    Fall Risk Fall Risk  10/03/2019 09/05/2018 07/17/2017 09/25/2015 08/13/2015  Falls in the past year? 0 1 No No No  Number falls in past yr: 0 0 - - -  Injury with Fall? 0 1 - - -  Risk for fall due to : Medication side effect History of fall(s) - (No Data) -  Risk for fall due to: Comment - - - No history of falls -  Follow up Falls evaluation completed;Falls prevention discussed Falls evaluation completed - - -    Any stairs in or around the home? Yes  If so, are there any without handrails? No  Home free of loose throw rugs in walkways, pet beds, electrical cords, etc? Yes  Adequate lighting in your home to reduce risk of falls? Yes   ASSISTIVE DEVICES UTILIZED TO PREVENT FALLS:  Life alert? No  Use of a cane, walker or w/c?  No  Grab bars in the bathroom? Yes  Shower chair or bench in shower? Yes  Elevated toilet seat  or a handicapped toilet? Yes   TIMED UP AND GO:  Was the test performed? Yes .  Length of time to ambulate 10 feet: 6  sec.   Gait steady and fast without use of assistive device  Cognitive Function: Cognitive screening not performed due to direct observation of normal cognition.        Immunizations Immunization History  Administered Date(s) Administered  . Influenza Split 12/13/2011, 01/16/2013  . Influenza Whole 01/23/2007  . Influenza, High Dose Seasonal PF 11/11/2013, 12/03/2014, 12/08/2016, 12/02/2017, 11/30/2018, 11/30/2018  . Influenza,inj,quad, With Preservative 12/05/2016  . Influenza-Unspecified 12/08/2016, 11/05/2017, 11/06/2018  . Moderna SARS-COVID-2 Vaccination 04/09/2019, 05/09/2019  . Pneumococcal Conjugate-13 07/07/2014  . Pneumococcal Polysaccharide-23 06/20/2013  . Td 02/25/2016  . Tdap 02/05/2016  . Zoster Recombinat (Shingrix) 07/20/2016    TDAP status: Up to date Flu Vaccine status: Up to date Pneumococcal vaccine status: Up to date Covid-19 vaccine status: Completed vaccines  Qualifies for Shingles Vaccine? Yes   Zostavax completed Yes   Shingrix Completed?: Yes  Screening Tests Health Maintenance  Topic Date Due  . Fecal DNA (Cologuard)  07/29/2019  . INFLUENZA VACCINE  10/06/2019  . MAMMOGRAM  08/21/2021  . TETANUS/TDAP  02/24/2026  . DEXA SCAN  Completed  . COVID-19 Vaccine  Completed  . Hepatitis C Screening  Completed  . PNA vac Low Risk Adult  Completed    Health Maintenance  Health Maintenance Due  Topic Date Due  . Fecal DNA (Cologuard)  07/29/2019    Colorectal cancer screening: Completed 08/26/2016. Repeat every 10 years Mammogram status: Completed 08/22/2019. Repeat every year Bone Density status: Ordered 10/03/2019. Pt provided with contact info and advised to call to schedule appt.  Lung Cancer Screening: (Low Dose CT  Chest recommended if Age 77-80 years, 30 pack-year currently smoking OR have quit w/in 15years.) does not qualify.   Lung Cancer Screening Referral: N/A  Additional Screening:  Hepatitis C Screening: does qualify; Completed 07/15/2016  Vision Screening: Recommended annual ophthalmology exams for early detection of glaucoma and other disorders of the eye. Is the patient up to date with their annual eye exam?  Yes  Who is the provider or what is the name of the office in which the patient attends annual eye exams? Dr. Marica Otter If pt is not established with a provider, would they like to be referred to a provider to establish care? No .   Dental Screening: Recommended annual dental exams for proper oral hygiene  Community Resource Referral / Chronic Care Management: CRR required this visit?  No   CCM required this visit?  No      Plan:     I have personally reviewed and noted the following in the patient's chart:   . Medical and social history . Use of alcohol, tobacco or illicit drugs  . Current medications and supplements . Functional ability and status . Nutritional status . Physical activity . Advanced directives . List of other physicians . Hospitalizations, surgeries, and ER visits in previous 12 months . Vitals . Screenings to include cognitive, depression, and falls . Referrals and appointments  In addition, I have reviewed and discussed with patient certain preventive protocols, quality metrics, and best practice recommendations. A written personalized care plan for preventive services as well as general preventive health recommendations were provided to patient.     Ofilia Neas, LPN   0/16/0109   Nurse Notes: None

## 2019-10-07 ENCOUNTER — Encounter: Payer: Self-pay | Admitting: Family Medicine

## 2019-10-17 ENCOUNTER — Other Ambulatory Visit: Payer: Self-pay | Admitting: Hematology & Oncology

## 2019-10-17 DIAGNOSIS — Z853 Personal history of malignant neoplasm of breast: Secondary | ICD-10-CM

## 2019-10-23 ENCOUNTER — Ambulatory Visit (INDEPENDENT_AMBULATORY_CARE_PROVIDER_SITE_OTHER): Payer: Medicare PPO | Admitting: Pharmacist

## 2019-10-23 ENCOUNTER — Other Ambulatory Visit: Payer: Self-pay

## 2019-10-23 DIAGNOSIS — Z5181 Encounter for therapeutic drug level monitoring: Secondary | ICD-10-CM

## 2019-10-23 DIAGNOSIS — Z952 Presence of prosthetic heart valve: Secondary | ICD-10-CM

## 2019-10-23 LAB — POCT INR: INR: 2.8 (ref 2.0–3.0)

## 2019-10-23 NOTE — Patient Instructions (Signed)
Description   Continue taking 1 tablet daily except 1/2 tablet on Monday, Wednesday, Friday. Recheck in 5 weeks.  [facility] [phone]

## 2019-11-06 ENCOUNTER — Other Ambulatory Visit: Payer: Self-pay | Admitting: Dermatology

## 2019-11-06 ENCOUNTER — Encounter: Payer: Self-pay | Admitting: Cardiovascular Disease

## 2019-11-06 ENCOUNTER — Encounter: Payer: Self-pay | Admitting: Dermatology

## 2019-11-06 ENCOUNTER — Ambulatory Visit: Payer: Medicare PPO | Admitting: Dermatology

## 2019-11-06 ENCOUNTER — Other Ambulatory Visit: Payer: Self-pay

## 2019-11-06 DIAGNOSIS — D225 Melanocytic nevi of trunk: Secondary | ICD-10-CM

## 2019-11-06 DIAGNOSIS — B078 Other viral warts: Secondary | ICD-10-CM | POA: Diagnosis not present

## 2019-11-06 DIAGNOSIS — L570 Actinic keratosis: Secondary | ICD-10-CM | POA: Diagnosis not present

## 2019-11-06 DIAGNOSIS — W57XXXA Bitten or stung by nonvenomous insect and other nonvenomous arthropods, initial encounter: Secondary | ICD-10-CM

## 2019-11-06 DIAGNOSIS — L821 Other seborrheic keratosis: Secondary | ICD-10-CM | POA: Diagnosis not present

## 2019-11-06 DIAGNOSIS — D485 Neoplasm of uncertain behavior of skin: Secondary | ICD-10-CM | POA: Diagnosis not present

## 2019-11-06 DIAGNOSIS — D229 Melanocytic nevi, unspecified: Secondary | ICD-10-CM

## 2019-11-06 DIAGNOSIS — S70362A Insect bite (nonvenomous), left thigh, initial encounter: Secondary | ICD-10-CM | POA: Diagnosis not present

## 2019-11-06 DIAGNOSIS — L929 Granulomatous disorder of the skin and subcutaneous tissue, unspecified: Secondary | ICD-10-CM | POA: Diagnosis not present

## 2019-11-06 NOTE — Progress Notes (Signed)
LN2 DR TAFEEN  Left shin x2 wart RIGHT SHIN X 1  Right elbow x1  TICK BITE LEFT INNER UPPER LEG

## 2019-11-06 NOTE — Patient Instructions (Addendum)
Biopsy, Surgery (Curettage) & Surgery (Excision) Aftercare Instructions  1. Okay to remove bandage in 24 hours  2. Wash area with soap and water  3. Apply Vaseline to area twice daily until healed (Not Neosporin)  4. Okay to cover with a Band-Aid to decrease the chance of infection or prevent irritation from clothing; also it's okay to uncover lesion at home.  5. Suture instructions: return to our office in 7-10 or 10-14 days for a nurse visit for suture removal. Variable healing with sutures, if pain or itching occurs call our office. It's okay to shower or bathe 24 hours after sutures are given.  6. The following risks may occur after a biopsy, curettage or excision: bleeding, scarring, discoloration, recurrence, infection (redness, yellow drainage, pain or swelling).  7. For questions, concerns and results call our office at [place] before 4pm & Friday before 3pm. Biopsy results will be available in 1 week.  Follow-up prompted by enlarging growth on scalp.  This cutaneous horn is most likely an irritated verruca.  Shave biopsy done and there is no special care she should not avoid taking her blood thinners.  We will call [facility] if the microscopic shows anything that requires follow-up.  Roughly half dozen crusts represent a mixture of actinic keratoses and habitual picking.  The thickest ones on the right arm and leg were treated with liquid nitrogen freeze.  The upper nose is smooth and requires no more freezing.  There are no atypical moles.  We also discussed a persistent 3 mm bump on the left upper inner leg where she had had a tick bite.  This represents a tick bite granuloma and if it does not spontaneously improve in the next month she has the option of having it injected.  Routine general skin examination annually.

## 2019-11-06 NOTE — Progress Notes (Signed)
Amanda Wiggins Date of Birth  Feb 25, 1946 [facility] [address]    [place], [place]  [address] [phone]  Fax  [phone]  Problem List 1. Aortic valve replacement 2. Hyperlipidemia 3. Breast cancer 4. CAD -  1. Mid LAD to Dist LAD lesion, 100% stenosed. Post intervention with a single Synergy DES 2.25 mm x 20 mm S/p mini vision stent to distal LAD     Amanda Wiggins is a 74 year old female with a history of aortic stenosis-status post aortic valve replacement.  She also has a history of hypercholesterolemia.  She's not had any episodes of chest pain or shortness of breath.  She's had her INR levels checked in our Coumadin clinic. Her INR levels have all been therapeutic.  Nov. 6 , 2014  Amanda Wiggins is doing well.  i saw her a year ago.  She is 10 years our from her breast cancer and is doing well.    No cardiac complaints.   Able to do all of her normal activities.     Nov. 13, 2015:  Amanda Wiggins is doing well.   No CP .  No dyspnea.   Has been stressful.  Her mother died this past year.   Her INR levels have been  Well controlled   Nov. 14, 2016:  Doing well.  No cardiac issues.  INR levels have been stable,  A bit low the last time .  Had an AVR 13 years ago .    June 05, 2015:  Amanda Wiggins was recently seen at the hospital for an acute anterior wall ST segment elevation myocardial infarction. She had stenting of her mid LAD. She had recurrent pain on the day of her original discharge and was taken back to the Cath Lab and had a second stent placed. She continued to have some intermittent episodes of chest discomfort. She has had persistent ST segment elevation. Her left ventricular systolic function is moderately depressed with an EF of 40-45%.   She has akinesis of the distal anterior wall and apex.   She is not having the band like chest pain that she had on presentation Has had some GERD.   Asked about going back on Omeprazole  - we discussed her need for Protonix with  the plavix .   September 01, 2015:  Amanda Wiggins is seen back for follow up visit. Still having trouble getting over her MI .  Still fatigued but is slowly getting better.   No CP.      Dec. 12, 2017:  feeling ok Does not have the energy that she wants ( and used to have prior to her MI )  BP has been in the 90-100 range.   September 21, 2016:    Amanda Wiggins is seen today for her AVR and CAD / MI  Her husband, [name], died last week  Has some leg aches.   Feb. 13, 2019: Amanda Wiggins is seen back today for follow up of her AVR and CHF Echo shows LV EF of 40% , mean AV gradient is 21 mmHg.  Is on coumadin and plavix  Has occasional left shoulder pain  Had stenting of LAD in March 2017.    Has had some balance issues.   Saw ENT. Thinks it may be orthostatic hypotension  Had ears cleaned out.   Feels better  Aug. 26, 2019:  Doing well. Has CAD, AVR   No recent CP , Gets winded with little exertion EF is 40%.    Bruises  quite a bit - is on coumadin and plavix   Feb. 24, 2020:  Seen for follow up of her CAD and AVR. Continues to have balance issues.   Lists to one side. She has been going to PT .   Was thought to be due to muscle weakness possibly related to the crestor.   She is on Pralulent  And her balance and muscle strength is better.  Works out at [employer] and is doing yoga   January, 2021:  Amanda Wiggins is seen today for a follow-up visit.  She was in the emergency room last week with chest pain. The pain was described as an achiness located under both breast.  It did been there for a week.  Pain seems to be different than her angina equivalent.  She had -2 - troponins while in the emergency room and was discharged in satisfactory condition.  The pain settled in her left shoulder blade .  No dyspnea, no pleuretic cp   Sept. 2, 2021  Amanda Wiggins is seen today for follow up of her AVR, CAD, CHF Recent echo revealed reduced LV function with EF 30-35% We brought her back to start Entresto but her BP was too low  to start Entresto.   Was started on Losartan instead.  Was started on Lasix 20 mg 3 times a weeks.    Is avoiding salt and salty foods.    Current Outpatient Medications on File Prior to Visit  Medication Sig Dispense Refill  . Alirocumab (PRALUENT) 75 MG/ML SOAJ Inject 1 pen into the skin every 14 (fourteen) days. 2 pen 11  . amoxicillin (AMOXIL) 500 MG capsule TAKE 4 CAPSULES BY MOUTH ONE HOUR PRIOR TO APPOINTMENT    . BIOTIN PO Take 1 tablet by mouth every morning.     . Calcium Carbonate-Vitamin D (CALCIUM 600+D) 600-400 MG-UNIT per tablet Take 1 tablet by mouth 2 (two) times daily.      . carvedilol (COREG) 3.125 MG tablet TAKE 1 TABLET (3.125 MG TOTAL) BY MOUTH 2 (TWO) TIMES DAILY WITH A MEAL. 180 tablet 3  . clopidogrel (PLAVIX) 75 MG tablet TAKE 1 TABLET (75 MG TOTAL) BY MOUTH DAILY WITH BREAKFAST. 90 tablet 3  . cycloSPORINE (RESTASIS) 0.05 % ophthalmic emulsion Place 1 drop into both eyes 2 (two) times daily as needed (DRY EYE).     . Desoximetasone 0.05 % GEL Apply 1 application topically as needed (AS NEEDED TO AFFECTED AREA).     . furosemide (LASIX) 20 MG tablet Take 1 tablet (20 mg total) by mouth 3 (three) times a week. 15 tablet 3  . Glucosamine-Chondroit-Vit C-Mn (GLUCOSAMINE CHONDR 1500 COMPLX) CAPS Take 1 capsule by mouth every morning.     . loratadine (CLARITIN) 10 MG tablet Take 10 mg by mouth daily.      Marland Kitchen losartan (COZAAR) 25 MG tablet Take 1 tablet (25 mg total) by mouth daily. 90 tablet 1  . Multiple Vitamin (MULTIVITAMIN) capsule Take 1 capsule by mouth daily.      . nitroGLYCERIN (NITROSTAT) 0.4 MG SL tablet Place 1 tablet (0.4 mg total) under the tongue every 5 (five) minutes as needed for chest pain. 25 tablet 6  . pantoprazole (PROTONIX) 40 MG tablet TAKE 1 TABLET BY MOUTH EVERY DAY 90 tablet 3  . temazepam (RESTORIL) 30 MG capsule TAKE 1 CAPSULE (30 MG TOTAL) BY MOUTH AT BEDTIME AS NEEDED FOR SLEEP. 30 capsule 0  . warfarin (COUMADIN) 5 MG tablet TAKE AS  DIRECTED BY  COUMADIN CLINIC 90 tablet 1   No current facility-administered medications on file prior to visit.    Allergies  Allergen Reactions  . Latex Hives  . Meperidine Nausea Only  . Sulfa Antibiotics Nausea Only  . Crestor [Rosuvastatin Calcium] Other (See Comments)    Myalgias on 10mg  and 20mg  daily  . Pravastatin Other (See Comments)    myalgias  . Sulfamethoxazole Nausea And Vomiting  . Repatha [Evolocumab] Other (See Comments)    myalgias    Past Medical History:  Diagnosis Date  . Aortic valve replaced   . Breast cancer ([place]) 2005  . FHx: migraine headaches   . GERD (gastroesophageal reflux disease)   . Hiatal hernia   . Hx Breast Cancer, IDC, Stage I 07/03/2003  . Hx of breast cancer   . Hyperlipidemia   . Hypertension   . Menopausal syndrome   . Osteopenia   . 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 Personal history of radiation therapy   . Status post aortic valve repair     Past Surgical History:  Procedure Laterality Date  . AORTIC VALVE REPLACEMENT  2003  . BREAST BIOPSY    . BREAST LUMPECTOMY Left 2005  . CARDIAC CATHETERIZATION     Ejection Fraction   . CARDIAC CATHETERIZATION N/A 05/16/2015   Procedure: Left Heart Cath and Coronary Angiography;  Surgeon: Leonie Man, MD;  Location: [facility];  Service: Cardiovascular;  Laterality: N/A;  . CARDIAC CATHETERIZATION  05/16/2015   Procedure: Coronary Stent Intervention;  Surgeon: Leonie Man, MD;  Location: [facility];  Service: Cardiovascular;;  . CARDIAC CATHETERIZATION N/A 05/19/2015   Procedure: Left Heart Cath and Coronary Angiography;  Surgeon: Troy Sine, MD;  Location: [facility];  Service: Cardiovascular;  Laterality: N/A;  . CARDIAC CATHETERIZATION N/A 05/19/2015   Procedure: Coronary Stent Intervention;  Surgeon: Troy Sine, MD;  Location: [facility];  Service: Cardiovascular;  Laterality: N/A;  . HIATAL HERNIA REPAIR    . HIATAL  HERNIA REPAIR  05/11/2007  . hysterectomy, endometiral cancer  2001  . lummpectomy breast cancer  07/03/2003  . [employer]an fundoplicaiton  [id number]   post-op hematoma on heparin   . right shouler surgery      Social History   Tobacco Use  Smoking Status Never Smoker  Smokeless Tobacco Never Used    Social History   Substance and Sexual Activity  Alcohol Use Yes  . Alcohol/week: 0.0 standard drinks   Comment: socially    Family History  Problem Relation Age of Onset  . Heart attack Mother 37  . Osteoarthritis Brother        hpercholesterolemia    Reviw of Systems:  Noted in current history, otherwise review of systems is negative.   \Physical Exam: Blood pressure 128/64, pulse 66, height 5\' 1"  (1.549 m), weight 145 lb 3.2 oz (65.9 kg), SpO2 98 %.  GEN:  Well nourished, well developed in no acute distress HEENT: Normal NECK: No JVD; No carotid bruits LYMPHATICS: No lymphadenopathy CARDIAC: RRR , no murmurs, rubs, gallops RESPIRATORY:  Clear to auscultation without rales, wheezing or rhonchi  ABDOMEN: Soft, non-tender, non-distended MUSCULOSKELETAL:  No edema; No deformity  SKIN: Warm and dry NEUROLOGIC:  Alert and oriented x 3   ECG:      Assessment / Plan:   1. Aortic valve replacement -    Stable,  Cont coumadin Valve sounds good.  INR levels are theraputic  2. Hyperlipidemia -    Cont current meds   3. Chest wall pain:    4. CAD  -  No angina .  Cont meds.     5. Chronic systolic CHF:   EF i s 92-23%.  I think her BP is high enough to try entresto  Will DC losartan,  DC lasix  Start entresto 24-26 BID  Needs more protein in her diet .    Advised her to exercise       Mertie Moores, MD  11/07/2019 10:23 AM    [place],  [place], [place]  [address] Pager [phone] Phone: [phone]; Fax: [phone]

## 2019-11-06 NOTE — Progress Notes (Signed)
   Follow-Up Visit   Subjective  Amanda Wiggins is a 74 y.o. female who presents for the following: Skin Problem (scalp spot thats groing).  Growth Location: Front of scalp Duration: Few months Quality: Enlarging Associated Signs/Symptoms: Modifying Factors:  Severity:  Timing: Context:   Objective  Well appearing patient in no apparent distress; mood and affect are within normal limits.  A focused examination was performed including Head, neck, back, arms, legs.. Relevant physical exam findings are noted in the Assessment and Plan.   Assessment & Plan    Neoplasm of uncertain behavior of skin Mid Parietal Scalp  Skin / nail biopsy Type of biopsy: tangential   Informed consent: discussed and consent obtained   Timeout: patient name, date of birth, surgical site, and procedure verified   Procedure prep:  Patient was prepped and draped in usual sterile fashion Prep type:  Chlorhexidine Anesthesia: the lesion was anesthetized in a standard fashion   Anesthetic:  1% lidocaine w/ epinephrine 1-100,000 local infiltration Instrument used: flexible razor blade   Hemostasis achieved with: ferric subsulfate   Outcome: patient tolerated procedure well   Post-procedure details: wound care instructions given    AK (actinic keratosis) (2) Right Upper Arm - Posterior; Left Lower Leg - Anterior  Destruction of lesion - Left Lower Leg - Anterior, Right Upper Arm - Posterior Complexity: simple   Destruction method: cryotherapy   Informed consent: discussed and consent obtained   Timeout:  patient name, date of birth, surgical site, and procedure verified Lesion destroyed using liquid nitrogen: Yes   Region frozen until ice ball extended beyond lesion: Yes   Cryotherapy cycles:  5 Outcome: patient tolerated procedure well with no complications    Seborrheic keratosis Left Thigh - Anterior  Leave if stable  Nevus Mid Back  Annual skin examination  Granuloma due to tick  bite Left Medial Thigh  If the papule persists and is bothersome she may return to have it injected with triamcinolone. Follow-up prompted by enlarging growth on scalp.  This cutaneous horn is most likely an irritated verruca.  Shave biopsy done and there is no special care she should not avoid taking her blood thinners.  We will call Savaya if the microscopic shows anything that requires follow-up.  Roughly half dozen crusts represent a mixture of actinic keratoses and habitual picking.  The thickest ones on the right arm and leg were treated with liquid nitrogen freeze.  The upper nose is smooth and requires no more freezing.  There are no atypical moles.  We also discussed a persistent 3 mm bump on the left upper inner leg where she had had a tick bite.  This represents a tick bite granuloma and if it does not spontaneously improve in the next month she has the option of having it injected.  Routine general skin examination annually.    I, Amanda Monarch, MD, have reviewed all documentation for this visit.  The documentation on 11/06/19 for the exam, diagnosis, procedures, and orders are all accurate and complete.

## 2019-11-07 ENCOUNTER — Encounter: Payer: Self-pay | Admitting: Cardiovascular Disease

## 2019-11-07 ENCOUNTER — Ambulatory Visit: Payer: Medicare PPO | Admitting: Cardiovascular Disease

## 2019-11-07 VITALS — BP 128/64 | HR 66 | Ht 61.0 in | Wt 145.2 lb

## 2019-11-07 DIAGNOSIS — Z9861 Coronary angioplasty status: Secondary | ICD-10-CM

## 2019-11-07 DIAGNOSIS — I2510 Atherosclerotic heart disease of native coronary artery without angina pectoris: Secondary | ICD-10-CM

## 2019-11-07 DIAGNOSIS — I5043 Acute on chronic combined systolic (congestive) and diastolic (congestive) heart failure: Secondary | ICD-10-CM | POA: Diagnosis not present

## 2019-11-07 MED ORDER — SACUBITRIL-VALSARTAN 24-26 MG PO TABS: 1.0000 | ORAL_TABLET | Freq: Two times a day (BID) | ORAL | 0 refills | Status: DC

## 2019-11-07 NOTE — Patient Instructions (Addendum)
Medication Instructions:  Your physician has recommended you make the following change in your medication:  1.) stop lasix (furosemide) 2.) stop losartan 3.) start Entresto (sacubitril-valsartan) 24-26 mg - one tablet by mouth twice a day  *If you need a refill on your cardiac medications before your next appointment, please call your pharmacy*   Lab Work: none If you have labs (blood work) drawn today and your tests are completely normal, you will receive your results only by: Marland Kitchen MyChart Message (if you have MyChart) OR . A paper copy in the mail If you have any lab test that is abnormal or we need to change your treatment, we will call you to review the results.   Testing/Procedures: none   Follow-Up: At [facility], you and your health needs are our priority.  As part of our continuing mission to provide you with exceptional heart care, we have created designated Provider Care Teams.  These Care Teams include your primary Cardiologist (physician) and Advanced Practice Providers (APPs -  Physician Assistants and Nurse Practitioners) who all work together to provide you with the care you need, when you need it.   Your next appointment:   2 month(s)  The format for your next appointment:   In Person  Provider:   You may see Mertie Moores, MD or the following Advanced Practice Provider on your designated Care Team:    Richardson Dopp, [place]     Other Instructions

## 2019-11-15 ENCOUNTER — Other Ambulatory Visit: Payer: Self-pay | Admitting: Hematology & Oncology

## 2019-11-15 DIAGNOSIS — Z853 Personal history of malignant neoplasm of breast: Secondary | ICD-10-CM

## 2019-11-19 DIAGNOSIS — I252 Old myocardial infarction: Secondary | ICD-10-CM | POA: Diagnosis not present

## 2019-11-19 DIAGNOSIS — E785 Hyperlipidemia, unspecified: Secondary | ICD-10-CM | POA: Diagnosis not present

## 2019-11-19 DIAGNOSIS — Z91040 Latex allergy status: Secondary | ICD-10-CM | POA: Diagnosis not present

## 2019-11-19 DIAGNOSIS — M1990 Unspecified osteoarthritis, unspecified site: Secondary | ICD-10-CM | POA: Diagnosis not present

## 2019-11-19 DIAGNOSIS — Z7901 Long term (current) use of anticoagulants: Secondary | ICD-10-CM | POA: Diagnosis not present

## 2019-11-19 DIAGNOSIS — K219 Gastro-esophageal reflux disease without esophagitis: Secondary | ICD-10-CM | POA: Diagnosis not present

## 2019-11-19 DIAGNOSIS — G8929 Other chronic pain: Secondary | ICD-10-CM | POA: Diagnosis not present

## 2019-11-19 DIAGNOSIS — Z7902 Long term (current) use of antithrombotics/antiplatelets: Secondary | ICD-10-CM | POA: Diagnosis not present

## 2019-11-19 DIAGNOSIS — I2510 Atherosclerotic heart disease of native coronary artery without angina pectoris: Secondary | ICD-10-CM | POA: Diagnosis not present

## 2019-11-19 DIAGNOSIS — M8580 Other specified disorders of bone density and structure, unspecified site: Secondary | ICD-10-CM | POA: Diagnosis not present

## 2019-11-27 ENCOUNTER — Ambulatory Visit (INDEPENDENT_AMBULATORY_CARE_PROVIDER_SITE_OTHER): Payer: Medicare PPO | Admitting: *Deleted

## 2019-11-27 ENCOUNTER — Other Ambulatory Visit: Payer: Self-pay

## 2019-11-27 DIAGNOSIS — Z952 Presence of prosthetic heart valve: Secondary | ICD-10-CM | POA: Diagnosis not present

## 2019-11-27 DIAGNOSIS — Z5181 Encounter for therapeutic drug level monitoring: Secondary | ICD-10-CM

## 2019-11-27 LAB — POCT INR: INR: 4.2 — AB (ref 2.0–3.0)

## 2019-11-27 NOTE — Patient Instructions (Signed)
Description   Hold warfarin today and tomorrow, then  continue taking 1 tablet daily except 1/2 tablet on Monday, Wednesday, Friday. Recheck in 2 weeks.  Coumadin Clinic [phone]

## 2019-11-28 ENCOUNTER — Other Ambulatory Visit: Payer: Self-pay | Admitting: Cardiovascular Disease

## 2019-12-01 ENCOUNTER — Encounter: Payer: Self-pay | Admitting: Dermatology

## 2019-12-11 ENCOUNTER — Ambulatory Visit (INDEPENDENT_AMBULATORY_CARE_PROVIDER_SITE_OTHER): Payer: Medicare PPO | Admitting: *Deleted

## 2019-12-11 ENCOUNTER — Other Ambulatory Visit: Payer: Self-pay

## 2019-12-11 DIAGNOSIS — Z5181 Encounter for therapeutic drug level monitoring: Secondary | ICD-10-CM | POA: Diagnosis not present

## 2019-12-11 DIAGNOSIS — Z952 Presence of prosthetic heart valve: Secondary | ICD-10-CM | POA: Diagnosis not present

## 2019-12-11 LAB — POCT INR: INR: 4.2 — AB (ref 2.0–3.0)

## 2019-12-11 NOTE — Patient Instructions (Signed)
Description   Hold warfarin today and hold warfarin tomorrow, then start taking 1/2 tablet daily except 1 tablet on Tuesday, Thursday, and Saturday. Recheck in 2 weeks.  Coumadin Clinic [phone]

## 2019-12-16 ENCOUNTER — Other Ambulatory Visit: Payer: Self-pay | Admitting: Hematology & Oncology

## 2019-12-16 DIAGNOSIS — Z853 Personal history of malignant neoplasm of breast: Secondary | ICD-10-CM

## 2019-12-18 ENCOUNTER — Ambulatory Visit: Payer: Medicare PPO | Admitting: Dermatology

## 2019-12-24 ENCOUNTER — Other Ambulatory Visit: Payer: Self-pay | Admitting: Cardiovascular Disease

## 2019-12-25 ENCOUNTER — Ambulatory Visit (INDEPENDENT_AMBULATORY_CARE_PROVIDER_SITE_OTHER): Payer: Medicare PPO | Admitting: *Deleted

## 2019-12-25 ENCOUNTER — Telehealth: Payer: Self-pay | Admitting: Cardiovascular Disease

## 2019-12-25 ENCOUNTER — Other Ambulatory Visit: Payer: Self-pay

## 2019-12-25 DIAGNOSIS — Z952 Presence of prosthetic heart valve: Secondary | ICD-10-CM | POA: Diagnosis not present

## 2019-12-25 DIAGNOSIS — Z5181 Encounter for therapeutic drug level monitoring: Secondary | ICD-10-CM

## 2019-12-25 LAB — POCT INR: INR: 2.8 (ref 2.0–3.0)

## 2019-12-25 NOTE — Patient Instructions (Signed)
Description   Continue taking Warfarin 1/2 tablet daily except 1 tablet on Tuesday, Thursday, and Saturday. Recheck in 2 weeks with Dr. Acie Fredrickson appt.  Coumadin Clinic [phone]

## 2019-12-25 NOTE — Telephone Encounter (Signed)
Pt c/o medication issue:  1. Name of Medication:   Alirocumab ([place]) 75 MG/ML SOAJ     2. How are you currently taking this medication (dosage and times per day)?   Not currently taking  3. Are you having a reaction (difficulty breathing--STAT)?   no  4. What is your medication issue?   Tam with [employer] with questions regarding   Alirocumab (PRALUENT) 75 MG/ML SOAJ    Questions need to be answered prior to authorization.   Reference number [id number]

## 2019-12-25 NOTE — Telephone Encounter (Signed)
Tier exception for praluent completed by phone

## 2020-01-10 ENCOUNTER — Other Ambulatory Visit: Payer: Self-pay

## 2020-01-10 ENCOUNTER — Ambulatory Visit: Payer: Medicare PPO | Admitting: Cardiovascular Disease

## 2020-01-10 ENCOUNTER — Ambulatory Visit (INDEPENDENT_AMBULATORY_CARE_PROVIDER_SITE_OTHER): Payer: Medicare PPO | Admitting: Pharmacist

## 2020-01-10 ENCOUNTER — Encounter: Payer: Self-pay | Admitting: Cardiovascular Disease

## 2020-01-10 VITALS — BP 96/62 | HR 80 | Ht 61.0 in | Wt 143.0 lb

## 2020-01-10 DIAGNOSIS — I2510 Atherosclerotic heart disease of native coronary artery without angina pectoris: Secondary | ICD-10-CM

## 2020-01-10 DIAGNOSIS — Z79899 Other long term (current) drug therapy: Secondary | ICD-10-CM | POA: Diagnosis not present

## 2020-01-10 DIAGNOSIS — Z952 Presence of prosthetic heart valve: Secondary | ICD-10-CM | POA: Diagnosis not present

## 2020-01-10 DIAGNOSIS — Z5181 Encounter for therapeutic drug level monitoring: Secondary | ICD-10-CM

## 2020-01-10 DIAGNOSIS — I5022 Chronic systolic (congestive) heart failure: Secondary | ICD-10-CM

## 2020-01-10 DIAGNOSIS — I5043 Acute on chronic combined systolic (congestive) and diastolic (congestive) heart failure: Secondary | ICD-10-CM

## 2020-01-10 LAB — POCT INR: INR: 3.1 — AB (ref 2.0–3.0)

## 2020-01-10 MED ORDER — NITROGLYCERIN 0.4 MG SL SUBL: 0.4000 mg | SUBLINGUAL_TABLET | SUBLINGUAL | 6 refills | Status: DC | PRN

## 2020-01-10 MED ORDER — ENTRESTO 24-26 MG PO TABS: 1.0000 | ORAL_TABLET | Freq: Two times a day (BID) | ORAL | 3 refills | Status: DC

## 2020-01-10 NOTE — Progress Notes (Signed)
Amanda Wiggins Date of Birth  Feb 25, 1946 [facility] [address]    [place], [place]  [address] [phone]  Fax  [phone]  Problem List 1. Aortic valve replacement 2. Hyperlipidemia 3. Breast cancer 4. CAD -  1. Mid LAD to Dist LAD lesion, 100% stenosed. Post intervention with a single Synergy DES 2.25 mm x 20 mm S/p mini vision stent to distal LAD     Amanda Wiggins is a 74 year old female with a history of aortic stenosis-status post aortic valve replacement.  She also has a history of hypercholesterolemia.  She's not had any episodes of chest pain or shortness of breath.  She's had her INR levels checked in our Coumadin clinic. Her INR levels have all been therapeutic.  Nov. 6 , 2014  Amanda Wiggins is doing well.  i saw her a year ago.  She is 10 years our from her breast cancer and is doing well.    No cardiac complaints.   Able to do all of her normal activities.     Nov. 13, 2015:  Amanda Wiggins is doing well.   No CP .  No dyspnea.   Has been stressful.  Her mother died this past year.   Her INR levels have been  Well controlled   Nov. 14, 2016:  Doing well.  No cardiac issues.  INR levels have been stable,  A bit low the last time .  Had an AVR 13 years ago .    June 05, 2015:  Amanda Wiggins was recently seen at the hospital for an acute anterior wall ST segment elevation myocardial infarction. She had stenting of her mid LAD. She had recurrent pain on the day of her original discharge and was taken back to the Cath Lab and had a second stent placed. She continued to have some intermittent episodes of chest discomfort. She has had persistent ST segment elevation. Her left ventricular systolic function is moderately depressed with an EF of 40-45%.   She has akinesis of the distal anterior wall and apex.   She is not having the band like chest pain that she had on presentation Has had some GERD.   Asked about going back on Omeprazole  - we discussed her need for Protonix with  the plavix .   September 01, 2015:  Amanda Wiggins is seen back for follow up visit. Still having trouble getting over her MI .  Still fatigued but is slowly getting better.   No CP.      Dec. 12, 2017:  feeling ok Does not have the energy that she wants ( and used to have prior to her MI )  BP has been in the 90-100 range.   September 21, 2016:    Amanda Wiggins is seen today for her AVR and CAD / MI  Her husband, [name], died last week  Has some leg aches.   Feb. 13, 2019: Amanda Wiggins is seen back today for follow up of her AVR and CHF Echo shows LV EF of 40% , mean AV gradient is 21 mmHg.  Is on coumadin and plavix  Has occasional left shoulder pain  Had stenting of LAD in March 2017.    Has had some balance issues.   Saw ENT. Thinks it may be orthostatic hypotension  Had ears cleaned out.   Feels better  Aug. 26, 2019:  Doing well. Has CAD, AVR   No recent CP , Gets winded with little exertion EF is 40%.    Bruises  quite a bit - is on coumadin and plavix   Feb. 24, 2020:  Seen for follow up of her CAD and AVR. Continues to have balance issues.   Lists to one side. She has been going to PT .   Was thought to be due to muscle weakness possibly related to the crestor.   She is on Pralulent  And her balance and muscle strength is better.  Works out at [employer] and is doing yoga   January, 2021:  Amanda Wiggins is seen today for a follow-up visit.  She was in the emergency room last week with chest pain. The pain was described as an achiness located under both breast.  It did been there for a week.  Pain seems to be different than her angina equivalent.  She had -2 - troponins while in the emergency room and was discharged in satisfactory condition.  The pain settled in her left shoulder blade .  No dyspnea, no pleuretic cp   Sept. 2, 2021  Amanda Wiggins is seen today for follow up of her AVR, CAD, CHF Recent echo revealed reduced LV function with EF 30-35% We brought her back to start Entresto but her BP was too low  to start Entresto.   Was started on Losartan instead.  Was started on Lasix 20 mg 3 times a weeks.    Is avoiding salt and salty foods.   January 10, 2020: Amanda Wiggins is seen today for follow-up visit.  She has a history of aortic valve replacement, coronary artery disease, congestive heart failure.  Her echocardiogram shows an ejection fraction of 30 to 35%.  We have started her on losartan and lasix   She has been changed to [facility] .  Is off lasix .   Feeling better on the Entresto   Has had some left arm / neck pain   Current Outpatient Medications on File Prior to Visit  Medication Sig Dispense Refill  . Alirocumab (PRALUENT) 75 MG/ML SOAJ Inject 1 pen into the skin every 14 (fourteen) days. 2 pen 11  . amoxicillin (AMOXIL) 500 MG capsule TAKE 4 CAPSULES BY MOUTH ONE HOUR PRIOR TO APPOINTMENT    . BIOTIN PO Take 1 tablet by mouth every morning.     . Calcium Carbonate-Vitamin D (CALCIUM 600+D) 600-400 MG-UNIT per tablet Take 1 tablet by mouth 2 (two) times daily.      . carvedilol (COREG) 3.125 MG tablet TAKE 1 TABLET (3.125 MG TOTAL) BY MOUTH 2 (TWO) TIMES DAILY WITH A MEAL. 180 tablet 3  . clopidogrel (PLAVIX) 75 MG tablet TAKE 1 TABLET (75 MG TOTAL) BY MOUTH DAILY WITH BREAKFAST. 90 tablet 3  . cycloSPORINE (RESTASIS) 0.05 % ophthalmic emulsion Place 1 drop into both eyes 2 (two) times daily as needed (DRY EYE).     . Desoximetasone 0.05 % GEL Apply 1 application topically as needed (AS NEEDED TO AFFECTED AREA).     Marland Kitchen ENTRESTO 24-26 MG TAKE 1 TABLET BY MOUTH TWICE A DAY 60 tablet 0  . Glucosamine-Chondroit-Vit C-Mn (GLUCOSAMINE CHONDR 1500 COMPLX) CAPS Take 1 capsule by mouth every morning.     . loratadine (CLARITIN) 10 MG tablet Take 10 mg by mouth daily.      . Multiple Vitamin (MULTIVITAMIN) capsule Take 1 capsule by mouth daily.      . nitroGLYCERIN (NITROSTAT) 0.4 MG SL tablet Place 1 tablet (0.4 mg total) under the tongue every 5 (five) minutes as needed for chest pain. 25 tablet 6   .  pantoprazole (PROTONIX) 40 MG tablet TAKE 1 TABLET BY MOUTH EVERY DAY 90 tablet 3  . temazepam (RESTORIL) 30 MG capsule TAKE 1 CAPSULE (30 MG TOTAL) BY MOUTH AT BEDTIME AS NEEDED FOR SLEEP. 30 capsule 0  . warfarin (COUMADIN) 5 MG tablet TAKE AS DIRECTED BY COUMADIN CLINIC 90 tablet 1   No current facility-administered medications on file prior to visit.    Allergies  Allergen Reactions  . Latex Hives  . Meperidine Nausea Only  . Sulfa Antibiotics Nausea Only  . Crestor [Rosuvastatin Calcium] Other (See Comments)    Myalgias on 10mg  and 20mg  daily  . Pravastatin Other (See Comments)    myalgias  . Sulfamethoxazole Nausea And Vomiting  . Repatha [Evolocumab] Other (See Comments)    myalgias    Past Medical History:  Diagnosis Date  . Aortic valve replaced   . Breast cancer ([place]) 2005  . FHx: migraine headaches   . GERD (gastroesophageal reflux disease)   . Hiatal hernia   . Hx Breast Cancer, IDC, Stage I 07/03/2003  . Hx of breast cancer   . Hyperlipidemia   . Hypertension   . Menopausal syndrome   . Osteopenia   . 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 Personal history of radiation therapy   . Status post aortic valve repair     Past Surgical History:  Procedure Laterality Date  . AORTIC VALVE REPLACEMENT  2003  . BREAST BIOPSY    . BREAST LUMPECTOMY Left 2005  . CARDIAC CATHETERIZATION     Ejection Fraction   . CARDIAC CATHETERIZATION N/A 05/16/2015   Procedure: Left Heart Cath and Coronary Angiography;  Surgeon: Leonie Man, MD;  Location: [facility];  Service: Cardiovascular;  Laterality: N/A;  . CARDIAC CATHETERIZATION  05/16/2015   Procedure: Coronary Stent Intervention;  Surgeon: Leonie Man, MD;  Location: [facility];  Service: Cardiovascular;;  . CARDIAC CATHETERIZATION N/A 05/19/2015   Procedure: Left Heart Cath and Coronary Angiography;  Surgeon: Troy Sine, MD;  Location: [facility];  Service:  Cardiovascular;  Laterality: N/A;  . CARDIAC CATHETERIZATION N/A 05/19/2015   Procedure: Coronary Stent Intervention;  Surgeon: Troy Sine, MD;  Location: [facility];  Service: Cardiovascular;  Laterality: N/A;  . HIATAL HERNIA REPAIR    . HIATAL HERNIA REPAIR  05/11/2007  . hysterectomy, endometiral cancer  2001  . lummpectomy breast cancer  07/03/2003  . nissan fundoplicaiton  0254   post-op hematoma on heparin   . right shouler surgery      Social History   Tobacco Use  Smoking Status Never Smoker  Smokeless Tobacco Never Used    Social History   Substance and Sexual Activity  Alcohol Use Yes  . Alcohol/week: 0.0 standard drinks   Comment: socially    Family History  Problem Relation Age of Onset  . Heart attack Mother 45  . Osteoarthritis Brother        hpercholesterolemia    Reviw of Systems:  Noted in current history, otherwise review of systems is negative.   Physical Exam: Blood pressure 96/62, pulse 80, height 5\' 1"  (1.549 m), weight 143 lb (64.9 kg).  GEN:  Well nourished, well developed in no acute distress HEENT: Normal NECK: No JVD; No carotid bruits LYMPHATICS: No lymphadenopathy CARDIAC: RRR , mechanical S2. RESPIRATORY:  Clear to auscultation without rales, wheezing or rhonchi  ABDOMEN: Soft, non-tender, non-distended MUSCULOSKELETAL:  No edema; No deformity ,  Tender  left shoulder  SKIN: Warm and dry NEUROLOGIC:  Alert and oriented x 3   ECG:      Assessment / Plan:   1. Aortic valve replacement -     valve sounds normal heard aortic valve sounds normal.  Nice crisp valve sounds.  2. Hyperlipidemia -    we will check lipids, liver enzymes, basic metabolic profile today.   3.  Chronic systolic congestive heart failure: She is now on carvedilol, Entresto 24-26 twice a day.  Her blood pressure is low.  I do not think that she will tolerate spironolactone.  Continue current medications.   4. CAD  -    She not having any  angina.   5. Chronic systolic CHF:   EF i s 47-07%.  I think her BP is high enough to try entresto  Will DC losartan,  DC lasix  Start entresto 24-26 BID  Needs more protein in her diet .    Advised her to exercise       Mertie Moores, MD  01/10/2020 11:03 AM    [address],  [place], [place]  [address] Pager [phone] Phone: [phone]; Fax: [phone]

## 2020-01-10 NOTE — Patient Instructions (Signed)
Take a 1/2 tablet tomorrow then continue taking Warfarin 1/2 tablet daily except 1 tablet on Tuesday, Thursday, and Saturday. Recheck in 2 weeks.  Coumadin Clinic [phone]

## 2020-01-10 NOTE — Patient Instructions (Signed)
Medication Instructions:  Your physician recommends that you continue on your current medications as directed. Please refer to the Current Medication list given to you today.  *If you need a refill on your cardiac medications before your next appointment, please call your pharmacy*   Lab Work: Today: Lipid profile, liver, BMP If you have labs (blood work) drawn today and your tests are completely normal, you will receive your results only by:  [place] (if you have MyChart) OR  A paper copy in the mail If you have any lab test that is abnormal or we need to change your treatment, we will call you to review the results.   Testing/Procedures: None ordered   Follow-Up: At [facility], you and your health needs are our priority.  As part of our continuing mission to provide you with exceptional heart care, we have created designated Provider Care Teams.  These Care Teams include your primary Cardiologist (physician) and Advanced Practice Providers (APPs -  Physician Assistants and Nurse Practitioners) who all work together to provide you with the care you need, when you need it.  Your next appointment:   6 month(s)  The format for your next appointment:   In Person  Provider:   Mertie Moores, MD    Thank you for choosing [facility]!!

## 2020-01-11 LAB — COMPREHENSIVE METABOLIC PANEL
ALT: 18 IU/L (ref 0–32)
AST: 25 IU/L (ref 0–40)
Albumin/Globulin Ratio: 2.2 (ref 1.2–2.2)
Albumin: 4.4 g/dL (ref 3.7–4.7)
Alkaline Phosphatase: 53 IU/L (ref 44–121)
BUN/Creatinine Ratio: 21 (ref 12–28)
BUN: 19 mg/dL (ref 8–27)
Bilirubin Total: 0.3 mg/dL (ref 0.0–1.2)
CO2: 26 mmol/L (ref 20–29)
Calcium: 9.3 mg/dL (ref 8.7–10.3)
Chloride: 104 mmol/L (ref 96–106)
Creatinine, Ser: 0.89 mg/dL (ref 0.57–1.00)
GFR calc Af Amer: 75 mL/min/{1.73_m2} (ref >59)
GFR calc non Af Amer: 65 mL/min/{1.73_m2} (ref >59)
Globulin, Total: 2 g/dL (ref 1.5–4.5)
Glucose: 116 mg/dL — ABNORMAL HIGH (ref 65–99)
Potassium: 4.3 mmol/L (ref 3.5–5.2)
Sodium: 143 mmol/L (ref 134–144)
Total Protein: 6.4 g/dL (ref 6.0–8.5)

## 2020-01-11 LAB — LIPID PANEL
Chol/HDL Ratio: 3.1 ratio (ref 0.0–4.4)
Cholesterol, Total: 147 mg/dL (ref 100–199)
HDL: 47 mg/dL (ref >39)
LDL Chol Calc (NIH): 65 mg/dL (ref 0–99)
Triglycerides: 212 mg/dL — ABNORMAL HIGH (ref 0–149)
VLDL Cholesterol Cal: 35 mg/dL (ref 5–40)

## 2020-01-15 ENCOUNTER — Other Ambulatory Visit: Payer: Self-pay | Admitting: Hematology & Oncology

## 2020-01-15 DIAGNOSIS — Z853 Personal history of malignant neoplasm of breast: Secondary | ICD-10-CM

## 2020-01-16 ENCOUNTER — Other Ambulatory Visit: Payer: Self-pay | Admitting: *Deleted

## 2020-01-20 DIAGNOSIS — M9903 Segmental and somatic dysfunction of lumbar region: Secondary | ICD-10-CM | POA: Diagnosis not present

## 2020-01-20 DIAGNOSIS — M9901 Segmental and somatic dysfunction of cervical region: Secondary | ICD-10-CM | POA: Diagnosis not present

## 2020-01-20 DIAGNOSIS — M9902 Segmental and somatic dysfunction of thoracic region: Secondary | ICD-10-CM | POA: Diagnosis not present

## 2020-01-20 DIAGNOSIS — M62830 Muscle spasm of back: Secondary | ICD-10-CM | POA: Diagnosis not present

## 2020-01-22 DIAGNOSIS — M9902 Segmental and somatic dysfunction of thoracic region: Secondary | ICD-10-CM | POA: Diagnosis not present

## 2020-01-22 DIAGNOSIS — M62830 Muscle spasm of back: Secondary | ICD-10-CM | POA: Diagnosis not present

## 2020-01-22 DIAGNOSIS — M9901 Segmental and somatic dysfunction of cervical region: Secondary | ICD-10-CM | POA: Diagnosis not present

## 2020-01-22 DIAGNOSIS — M9903 Segmental and somatic dysfunction of lumbar region: Secondary | ICD-10-CM | POA: Diagnosis not present

## 2020-01-23 DIAGNOSIS — M9903 Segmental and somatic dysfunction of lumbar region: Secondary | ICD-10-CM | POA: Diagnosis not present

## 2020-01-23 DIAGNOSIS — M9901 Segmental and somatic dysfunction of cervical region: Secondary | ICD-10-CM | POA: Diagnosis not present

## 2020-01-23 DIAGNOSIS — M62830 Muscle spasm of back: Secondary | ICD-10-CM | POA: Diagnosis not present

## 2020-01-23 DIAGNOSIS — M9902 Segmental and somatic dysfunction of thoracic region: Secondary | ICD-10-CM | POA: Diagnosis not present

## 2020-01-27 DIAGNOSIS — M9902 Segmental and somatic dysfunction of thoracic region: Secondary | ICD-10-CM | POA: Diagnosis not present

## 2020-01-27 DIAGNOSIS — M62830 Muscle spasm of back: Secondary | ICD-10-CM | POA: Diagnosis not present

## 2020-01-27 DIAGNOSIS — M9903 Segmental and somatic dysfunction of lumbar region: Secondary | ICD-10-CM | POA: Diagnosis not present

## 2020-01-27 DIAGNOSIS — M9901 Segmental and somatic dysfunction of cervical region: Secondary | ICD-10-CM | POA: Diagnosis not present

## 2020-01-28 ENCOUNTER — Ambulatory Visit (INDEPENDENT_AMBULATORY_CARE_PROVIDER_SITE_OTHER): Payer: Medicare PPO

## 2020-01-28 ENCOUNTER — Other Ambulatory Visit: Payer: Self-pay

## 2020-01-28 DIAGNOSIS — H5213 Myopia, bilateral: Secondary | ICD-10-CM | POA: Diagnosis not present

## 2020-01-28 DIAGNOSIS — Z952 Presence of prosthetic heart valve: Secondary | ICD-10-CM | POA: Diagnosis not present

## 2020-01-28 DIAGNOSIS — M9903 Segmental and somatic dysfunction of lumbar region: Secondary | ICD-10-CM | POA: Diagnosis not present

## 2020-01-28 DIAGNOSIS — H25013 Cortical age-related cataract, bilateral: Secondary | ICD-10-CM | POA: Diagnosis not present

## 2020-01-28 DIAGNOSIS — Z5181 Encounter for therapeutic drug level monitoring: Secondary | ICD-10-CM | POA: Diagnosis not present

## 2020-01-28 DIAGNOSIS — H524 Presbyopia: Secondary | ICD-10-CM | POA: Diagnosis not present

## 2020-01-28 DIAGNOSIS — M9902 Segmental and somatic dysfunction of thoracic region: Secondary | ICD-10-CM | POA: Diagnosis not present

## 2020-01-28 DIAGNOSIS — H52223 Regular astigmatism, bilateral: Secondary | ICD-10-CM | POA: Diagnosis not present

## 2020-01-28 DIAGNOSIS — M62830 Muscle spasm of back: Secondary | ICD-10-CM | POA: Diagnosis not present

## 2020-01-28 DIAGNOSIS — M9901 Segmental and somatic dysfunction of cervical region: Secondary | ICD-10-CM | POA: Diagnosis not present

## 2020-01-28 LAB — POCT INR: INR: 3.3 — AB (ref 2.0–3.0)

## 2020-01-28 NOTE — Patient Instructions (Signed)
Description   Take a 1/2 tablet today, then start taking Warfarin 1/2 tablet daily except 1 tablet on Tuesdays and Saturdays. Recheck in 2 weeks.  Coumadin Clinic [phone]

## 2020-01-29 DIAGNOSIS — M62830 Muscle spasm of back: Secondary | ICD-10-CM | POA: Diagnosis not present

## 2020-01-29 DIAGNOSIS — M9903 Segmental and somatic dysfunction of lumbar region: Secondary | ICD-10-CM | POA: Diagnosis not present

## 2020-01-29 DIAGNOSIS — M9902 Segmental and somatic dysfunction of thoracic region: Secondary | ICD-10-CM | POA: Diagnosis not present

## 2020-01-29 DIAGNOSIS — M9901 Segmental and somatic dysfunction of cervical region: Secondary | ICD-10-CM | POA: Diagnosis not present

## 2020-02-03 DIAGNOSIS — M9901 Segmental and somatic dysfunction of cervical region: Secondary | ICD-10-CM | POA: Diagnosis not present

## 2020-02-03 DIAGNOSIS — M9902 Segmental and somatic dysfunction of thoracic region: Secondary | ICD-10-CM | POA: Diagnosis not present

## 2020-02-03 DIAGNOSIS — M9903 Segmental and somatic dysfunction of lumbar region: Secondary | ICD-10-CM | POA: Diagnosis not present

## 2020-02-03 DIAGNOSIS — M62830 Muscle spasm of back: Secondary | ICD-10-CM | POA: Diagnosis not present

## 2020-02-09 ENCOUNTER — Other Ambulatory Visit: Payer: Self-pay | Admitting: Cardiovascular Disease

## 2020-02-10 DIAGNOSIS — M9902 Segmental and somatic dysfunction of thoracic region: Secondary | ICD-10-CM | POA: Diagnosis not present

## 2020-02-10 DIAGNOSIS — M62830 Muscle spasm of back: Secondary | ICD-10-CM | POA: Diagnosis not present

## 2020-02-10 DIAGNOSIS — M9903 Segmental and somatic dysfunction of lumbar region: Secondary | ICD-10-CM | POA: Diagnosis not present

## 2020-02-10 DIAGNOSIS — M9901 Segmental and somatic dysfunction of cervical region: Secondary | ICD-10-CM | POA: Diagnosis not present

## 2020-02-12 DIAGNOSIS — M9901 Segmental and somatic dysfunction of cervical region: Secondary | ICD-10-CM | POA: Diagnosis not present

## 2020-02-12 DIAGNOSIS — M9903 Segmental and somatic dysfunction of lumbar region: Secondary | ICD-10-CM | POA: Diagnosis not present

## 2020-02-12 DIAGNOSIS — M62830 Muscle spasm of back: Secondary | ICD-10-CM | POA: Diagnosis not present

## 2020-02-12 DIAGNOSIS — M9902 Segmental and somatic dysfunction of thoracic region: Secondary | ICD-10-CM | POA: Diagnosis not present

## 2020-02-13 ENCOUNTER — Ambulatory Visit (INDEPENDENT_AMBULATORY_CARE_PROVIDER_SITE_OTHER): Payer: Medicare PPO | Admitting: *Deleted

## 2020-02-13 ENCOUNTER — Other Ambulatory Visit: Payer: Self-pay

## 2020-02-13 DIAGNOSIS — Z5181 Encounter for therapeutic drug level monitoring: Secondary | ICD-10-CM | POA: Diagnosis not present

## 2020-02-13 DIAGNOSIS — Z952 Presence of prosthetic heart valve: Secondary | ICD-10-CM

## 2020-02-13 LAB — POCT INR: INR: 2.4 (ref 2.0–3.0)

## 2020-02-13 NOTE — Patient Instructions (Addendum)
Description   Continue taking Warfarin 1/2 tablet daily except 1 tablet on Tuesdays and Saturdays. Recheck in 3 weeks. Coumadin Clinic [phone]

## 2020-02-14 ENCOUNTER — Other Ambulatory Visit: Payer: Self-pay | Admitting: Cardiovascular Disease

## 2020-02-14 ENCOUNTER — Other Ambulatory Visit: Payer: Self-pay | Admitting: Hematology & Oncology

## 2020-02-14 DIAGNOSIS — Z853 Personal history of malignant neoplasm of breast: Secondary | ICD-10-CM

## 2020-02-17 DIAGNOSIS — M9901 Segmental and somatic dysfunction of cervical region: Secondary | ICD-10-CM | POA: Diagnosis not present

## 2020-02-17 DIAGNOSIS — M9902 Segmental and somatic dysfunction of thoracic region: Secondary | ICD-10-CM | POA: Diagnosis not present

## 2020-02-17 DIAGNOSIS — M62830 Muscle spasm of back: Secondary | ICD-10-CM | POA: Diagnosis not present

## 2020-02-17 DIAGNOSIS — M9903 Segmental and somatic dysfunction of lumbar region: Secondary | ICD-10-CM | POA: Diagnosis not present

## 2020-02-20 DIAGNOSIS — M9901 Segmental and somatic dysfunction of cervical region: Secondary | ICD-10-CM | POA: Diagnosis not present

## 2020-02-20 DIAGNOSIS — M9903 Segmental and somatic dysfunction of lumbar region: Secondary | ICD-10-CM | POA: Diagnosis not present

## 2020-02-20 DIAGNOSIS — M62830 Muscle spasm of back: Secondary | ICD-10-CM | POA: Diagnosis not present

## 2020-02-20 DIAGNOSIS — M9902 Segmental and somatic dysfunction of thoracic region: Secondary | ICD-10-CM | POA: Diagnosis not present

## 2020-02-24 DIAGNOSIS — M9901 Segmental and somatic dysfunction of cervical region: Secondary | ICD-10-CM | POA: Diagnosis not present

## 2020-02-24 DIAGNOSIS — M9902 Segmental and somatic dysfunction of thoracic region: Secondary | ICD-10-CM | POA: Diagnosis not present

## 2020-02-24 DIAGNOSIS — M9903 Segmental and somatic dysfunction of lumbar region: Secondary | ICD-10-CM | POA: Diagnosis not present

## 2020-02-24 DIAGNOSIS — M62830 Muscle spasm of back: Secondary | ICD-10-CM | POA: Diagnosis not present

## 2020-02-27 DIAGNOSIS — M62830 Muscle spasm of back: Secondary | ICD-10-CM | POA: Diagnosis not present

## 2020-02-27 DIAGNOSIS — M9901 Segmental and somatic dysfunction of cervical region: Secondary | ICD-10-CM | POA: Diagnosis not present

## 2020-02-27 DIAGNOSIS — M9903 Segmental and somatic dysfunction of lumbar region: Secondary | ICD-10-CM | POA: Diagnosis not present

## 2020-02-27 DIAGNOSIS — M9902 Segmental and somatic dysfunction of thoracic region: Secondary | ICD-10-CM | POA: Diagnosis not present

## 2020-03-03 DIAGNOSIS — M9903 Segmental and somatic dysfunction of lumbar region: Secondary | ICD-10-CM | POA: Diagnosis not present

## 2020-03-03 DIAGNOSIS — M9901 Segmental and somatic dysfunction of cervical region: Secondary | ICD-10-CM | POA: Diagnosis not present

## 2020-03-03 DIAGNOSIS — M62830 Muscle spasm of back: Secondary | ICD-10-CM | POA: Diagnosis not present

## 2020-03-03 DIAGNOSIS — M9902 Segmental and somatic dysfunction of thoracic region: Secondary | ICD-10-CM | POA: Diagnosis not present

## 2020-03-05 ENCOUNTER — Other Ambulatory Visit: Payer: Self-pay

## 2020-03-05 ENCOUNTER — Ambulatory Visit (INDEPENDENT_AMBULATORY_CARE_PROVIDER_SITE_OTHER): Payer: Medicare PPO | Admitting: *Deleted

## 2020-03-05 DIAGNOSIS — Z952 Presence of prosthetic heart valve: Secondary | ICD-10-CM

## 2020-03-05 DIAGNOSIS — M62830 Muscle spasm of back: Secondary | ICD-10-CM | POA: Diagnosis not present

## 2020-03-05 DIAGNOSIS — Z5181 Encounter for therapeutic drug level monitoring: Secondary | ICD-10-CM | POA: Diagnosis not present

## 2020-03-05 DIAGNOSIS — M9902 Segmental and somatic dysfunction of thoracic region: Secondary | ICD-10-CM | POA: Diagnosis not present

## 2020-03-05 DIAGNOSIS — M9901 Segmental and somatic dysfunction of cervical region: Secondary | ICD-10-CM | POA: Diagnosis not present

## 2020-03-05 DIAGNOSIS — M9903 Segmental and somatic dysfunction of lumbar region: Secondary | ICD-10-CM | POA: Diagnosis not present

## 2020-03-05 LAB — POCT INR: INR: 1.8 — AB (ref 2.0–3.0)

## 2020-03-05 NOTE — Patient Instructions (Signed)
Description   Today take 1 tablet then start taking Warfarin 1/2 tablet daily except 1 tablet on Tuesdays, Thursdays, and Saturdays. Recheck in 3 weeks. Coumadin Clinic [phone]

## 2020-03-09 DIAGNOSIS — H6123 Impacted cerumen, bilateral: Secondary | ICD-10-CM | POA: Diagnosis not present

## 2020-03-11 DIAGNOSIS — M9902 Segmental and somatic dysfunction of thoracic region: Secondary | ICD-10-CM | POA: Diagnosis not present

## 2020-03-11 DIAGNOSIS — M62830 Muscle spasm of back: Secondary | ICD-10-CM | POA: Diagnosis not present

## 2020-03-11 DIAGNOSIS — M9903 Segmental and somatic dysfunction of lumbar region: Secondary | ICD-10-CM | POA: Diagnosis not present

## 2020-03-11 DIAGNOSIS — M9901 Segmental and somatic dysfunction of cervical region: Secondary | ICD-10-CM | POA: Diagnosis not present

## 2020-03-15 ENCOUNTER — Other Ambulatory Visit: Payer: Self-pay | Admitting: Hematology & Oncology

## 2020-03-15 DIAGNOSIS — Z853 Personal history of malignant neoplasm of breast: Secondary | ICD-10-CM

## 2020-03-16 ENCOUNTER — Other Ambulatory Visit: Payer: Self-pay | Admitting: *Deleted

## 2020-03-16 DIAGNOSIS — Z853 Personal history of malignant neoplasm of breast: Secondary | ICD-10-CM

## 2020-03-16 MED ORDER — TEMAZEPAM 30 MG PO CAPS: ORAL_CAPSULE | ORAL | 0 refills | Status: DC

## 2020-03-17 DIAGNOSIS — M9903 Segmental and somatic dysfunction of lumbar region: Secondary | ICD-10-CM | POA: Diagnosis not present

## 2020-03-17 DIAGNOSIS — M62830 Muscle spasm of back: Secondary | ICD-10-CM | POA: Diagnosis not present

## 2020-03-17 DIAGNOSIS — M9902 Segmental and somatic dysfunction of thoracic region: Secondary | ICD-10-CM | POA: Diagnosis not present

## 2020-03-17 DIAGNOSIS — M9901 Segmental and somatic dysfunction of cervical region: Secondary | ICD-10-CM | POA: Diagnosis not present

## 2020-03-20 DIAGNOSIS — M65312 Trigger thumb, left thumb: Secondary | ICD-10-CM | POA: Diagnosis not present

## 2020-03-20 DIAGNOSIS — M1812 Unilateral primary osteoarthritis of first carpometacarpal joint, left hand: Secondary | ICD-10-CM | POA: Diagnosis not present

## 2020-03-26 ENCOUNTER — Other Ambulatory Visit: Payer: Self-pay

## 2020-03-26 ENCOUNTER — Ambulatory Visit (INDEPENDENT_AMBULATORY_CARE_PROVIDER_SITE_OTHER): Payer: Medicare PPO | Admitting: *Deleted

## 2020-03-26 DIAGNOSIS — Z5181 Encounter for therapeutic drug level monitoring: Secondary | ICD-10-CM | POA: Diagnosis not present

## 2020-03-26 DIAGNOSIS — Z952 Presence of prosthetic heart valve: Secondary | ICD-10-CM | POA: Diagnosis not present

## 2020-03-26 DIAGNOSIS — M9901 Segmental and somatic dysfunction of cervical region: Secondary | ICD-10-CM | POA: Diagnosis not present

## 2020-03-26 DIAGNOSIS — M9903 Segmental and somatic dysfunction of lumbar region: Secondary | ICD-10-CM | POA: Diagnosis not present

## 2020-03-26 DIAGNOSIS — M9902 Segmental and somatic dysfunction of thoracic region: Secondary | ICD-10-CM | POA: Diagnosis not present

## 2020-03-26 DIAGNOSIS — M62830 Muscle spasm of back: Secondary | ICD-10-CM | POA: Diagnosis not present

## 2020-03-26 LAB — POCT INR: INR: 2.5 (ref 2.0–3.0)

## 2020-03-26 NOTE — Patient Instructions (Signed)
Description   Continue taking Warfarin 1/2 tablet daily except 1 tablet on Tuesdays, Thursdays, and Saturdays. Recheck in 4 weeks. Coumadin Clinic [phone]

## 2020-04-06 ENCOUNTER — Ambulatory Visit: Payer: Medicare PPO | Admitting: Family Medicine

## 2020-04-13 ENCOUNTER — Other Ambulatory Visit: Payer: Self-pay

## 2020-04-13 MED ORDER — CARVEDILOL 3.125 MG PO TABS: 3.1250 mg | ORAL_TABLET | Freq: Two times a day (BID) | ORAL | 3 refills | Status: DC

## 2020-04-14 ENCOUNTER — Other Ambulatory Visit: Payer: Self-pay | Admitting: Hematology & Oncology

## 2020-04-14 DIAGNOSIS — Z853 Personal history of malignant neoplasm of breast: Secondary | ICD-10-CM

## 2020-04-15 ENCOUNTER — Other Ambulatory Visit: Payer: Self-pay

## 2020-04-15 ENCOUNTER — Ambulatory Visit: Payer: Medicare PPO | Admitting: Family Medicine

## 2020-04-15 ENCOUNTER — Encounter: Payer: Self-pay | Admitting: Family Medicine

## 2020-04-15 VITALS — BP 110/64 | HR 74 | Temp 98.4°F | Wt 143.8 lb

## 2020-04-15 DIAGNOSIS — I5043 Acute on chronic combined systolic (congestive) and diastolic (congestive) heart failure: Secondary | ICD-10-CM | POA: Diagnosis not present

## 2020-04-15 DIAGNOSIS — I10 Essential (primary) hypertension: Secondary | ICD-10-CM | POA: Diagnosis not present

## 2020-04-15 DIAGNOSIS — R600 Localized edema: Secondary | ICD-10-CM

## 2020-04-15 DIAGNOSIS — Z7901 Long term (current) use of anticoagulants: Secondary | ICD-10-CM | POA: Diagnosis not present

## 2020-04-15 DIAGNOSIS — R2689 Other abnormalities of gait and mobility: Secondary | ICD-10-CM | POA: Diagnosis not present

## 2020-04-15 NOTE — Progress Notes (Signed)
Subjective:    Patient ID: Amanda Wiggins, female    DOB: 1945/10/14, 75 y.o.   MRN: [id number]  No chief complaint on file.   HPI Patient is a 75 year old female with past medical history significant for HTN, STEMI, CAD s/p PCI, s/p aortic valve replacement, chronic combined CHF, GERD, allergies, migraines, osteoporosis, h/o endometrial cancer and left breast cancer s/p lumpectomy and SLN who was seen today for f/u.   Pt notes LE edema over the wknd after eating more salt and sitting for extended periods while playing cards.  Pt went to the beach with another friends, endorses eating more and at restaurants.  Lower extremities became painful and noticeably swollen, but have since resolved.  Patient denies SOB, CP.  Taking Entresto for CHF.  Notes increase in balance issues and falls.  In the past seen by PT to improve core strength.  No longer exercising 2/2 Covid-19 pandemic.  Denies dizziness, feeling like the room is spinning.  Has ecchymosis 2/2 blood thinner use.  Patient inquires about bone density.  Order placed in July, but pt has not heard about making the appointment.  Past Medical History:  Diagnosis Date  . Aortic valve replaced   . Breast cancer ([place]) 2005  . FHx: migraine headaches   . GERD (gastroesophageal reflux disease)   . Hiatal hernia   . Hx Breast Cancer, IDC, Stage I 07/03/2003  . Hx of breast cancer   . Hyperlipidemia   . Hypertension   . Menopausal syndrome   . Osteopenia   . 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 Personal history of radiation therapy   . Status post aortic valve repair     Allergies  Allergen Reactions  . Latex Hives  . Meperidine Nausea Only  . Sulfa Antibiotics Nausea Only  . Crestor [Rosuvastatin Calcium] Other (See Comments)    Myalgias on 10mg  and 20mg  daily  . Pravastatin Other (See Comments)    myalgias  . Sulfamethoxazole Nausea And Vomiting  . Repatha [Evolocumab] Other (See Comments)    myalgias     ROS General: Denies fever, chills, night sweats, changes in weight, changes in appetite  +balance issues HEENT: Denies headaches, ear pain, changes in vision, rhinorrhea, sore throat CV: Denies CP, palpitations, SOB, orthopnea  +LE edema Pulm: Denies SOB, cough, wheezing GI: Denies abdominal pain, nausea, vomiting, diarrhea, constipation GU: Denies dysuria, hematuria, frequency, vaginal discharge Msk: Denies muscle cramps, joint pains Neuro: Denies weakness, numbness, tingling Skin: Denies rashes, bruising Psych: Denies depression, anxiety, hallucinations     Objective:    Blood pressure 110/64, pulse 74, temperature 98.4 F (36.9 C), temperature source Oral, weight 143 lb 12.8 oz (65.2 kg), SpO2 96 %.  Gen. Pleasant, well-nourished, in no distress, normal affect HEENT: [place]/AT, face symmetric, conjunctiva clear, no scleral icterus, PERRLA, EOMI, nares patent without drainage Lungs: no accessory muscle use, CTAB, no wheezes or rales Cardiovascular: RRR, no m/r/g, trace edema of R ankle. Musculoskeletal: No deformities, no cyanosis or clubbing, normal tone Neuro:  A&Ox3, CN II-XII intact, normal gait Skin:  Warm, no lesions/ rash   Wt Readings from Last 3 Encounters:  01/10/20 143 lb (64.9 kg)  11/07/19 145 lb 3.2 oz (65.9 kg)  10/03/19 139 lb (63 kg)    Lab Results  Component Value Date   WBC 6.2 08/13/2019   HGB 14.6 08/13/2019   HCT 46.1 (H) 08/13/2019   PLT 287 08/13/2019   GLUCOSE 116 (H) 01/10/2020   CHOL 147 01/10/2020  TRIG 212 (H) 01/10/2020   HDL 47 01/10/2020   LDLDIRECT 54.0 09/05/2018   LDLCALC 65 01/10/2020   ALT 18 01/10/2020   AST 25 01/10/2020   NA 143 01/10/2020   K 4.3 01/10/2020   CL 104 01/10/2020   CREATININE 0.89 01/10/2020   BUN 19 01/10/2020   CO2 26 01/10/2020   TSH 3.09 07/17/2017   INR 2.5 03/26/2020   HGBA1C 6.5 09/05/2018    Assessment/Plan:  Essential hypertension -Controlled -Continue current medications including  Coreg 3.215 mg twice daily -Continue lifestyle modifications -Patient encouraged to check BP at home and keep a log to bring with you to clinic  Acute on chronic combined systolic and diastolic CHF (congestive heart failure) (HCC) -Improving -Continue current medications including Entresto 24-26 mg twice daily and Coreg 3.125 mg twice daily -Continue follow-up with cardiology  Bilateral leg edema -Improving, nearly resolved -Likely 2/2 increased sodium intake and prolonged sitting -Continue current medications -Discussed supportive care including elevation of lower extremities when sitting, decreasing sodium intake and p.o. intake of fluids -Discussed compression socks -Given handout -Continue to monitor  Balance problem -Discussed restarting exercises at home to improve core strength - Plan: Ambulatory referral to Physical Therapy  Anticoagulated on Coumadin -Continue current dose of warfarin and Plavix 75 mg daily. -Patient encouraged to keep appointment for INR check  F/u as needed  [name], MD

## 2020-04-15 NOTE — Patient Instructions (Addendum)
You can find compression socks over the counter at your local drug store, [employer], [employer], or online on [email or web address]-[email or web address]    Living With Heart Failure Heart failure is a long-term (chronic) condition in which the heart cannot pump enough blood through the body. When this happens, parts of the body do not get the blood and oxygen they need. There is no cure for heart failure at this time, so it is important for you to take good care of yourself and follow the treatment plan you set with your health care provider. If you are living with heart failure, there are ways to help you manage the disease. How to manage lifestyle changes Living with heart failure requires you to make changes in your life. Your health care team will teach you about the changes you need to make in order to relieve your symptoms and lower your risk of going to the hospital. Work with your health care provider to develop a treatment plan that works for you. Activity  Ask your health care provider about attending cardiac rehabilitation. These programs include aerobic physical activity, which provides many benefits for your heart.  If no cardiac rehabilitation program is available, ask your health care provider what aerobic exercises are safe for you to do.  Return to your normal activities as told by your health care provider. Ask your health care provider what activities are safe for you.  Pace your daily activities and allow time for rest as needed. Managing stress It is normal to have many emotions about your diagnosis, such as fear, sadness, anger, and loss. If you feel any of these emotions and need help coping, contact your health care provider. Here are some ways to help yourself manage these emotions:  Talk to friends and family members about your condition. They can give you support and guidance. Explain your symptoms to them and, if comfortable, invite them to attend appointments or rehabilitation with you.  Join a  support group for people with chronic heart failure. Talking with other people who have the same symptoms may give you new ways of coping with your disease and your emotions.  Accept help from others. Do not be ashamed if you need help with certain tasks.  Use stress management techniques, such as meditation, breathing exercises, or listening to relaxing music. Conditions such as depression and anxiety are common in persons with heart failure. Pay attention to changes in your mood, emotions, and stress levels. Tell your health care provider if you have any of the following symptoms:  Trouble sleeping or a change in your sleeping patterns.  Feeling sad, down, or depressed more often than not, every day for more than 2 weeks.  Losing interest in activities you normally enjoy.  Feeling irritable or crying for no reason.  Finding yourself worrying about the future often. Work You may need to develop a plan with your health care provider if heart failure interferes with your ability to work. This may include:  Reducing your work hours.  Finding functions that are less active or require less effort.  Planning rest periods during your work hours. Travel  Talk with your health care provider if you plan to travel. There may be circumstances in which your health care provider recommends that you do not travel or that you delay travel until your condition is under control.  When you travel, bring your medicine and a list of your medicines. If you are traveling by air, keep your medicines with you in a  carry-on bag.  Consider finding a medical facility in the area you will be traveling to and determine what your health insurance will cover.  If you will be traveling by public transportation (airplane, train, bus), contact the company prior to traveling if you have special needs. This may include needs related to diet, oxygen, a wheelchair, a seating request, or help with luggage.  If you use  oxygen, make sure to bring enough oxygen with you.  If you have a battery-powered device, bring a fully charged extra battery with you.  If you have a device, bring a note from your health care provider and inform all security screening personnel that you have the device. You may need to go through special screening for safety. Sexual activity  Ask your health care provider when it is safe for you to resume sexual activity.  You may need to start slowly and gradually increase intimacy. You can increase intimacy by doing such things as caressing, touching, and holding each other.  Get regular exercise as told by your health care provider. This can benefit your sex life by building strength and endurance.   Sleep If your condition interferes with your sleep, find ways to improve your sleep quality, such as:  Sleep lying on your side, or sleep with your head elevated by raising the head of your bed or using multiple pillows.  Ask your health care provider about screening for sleep apnea.  Try to go to sleep and wake up at the same times every day.  Sleep in a dark, cool room.  Do not do any physical activity or eating for a few hours before bedtime.  Plan rest periods during the day, but do not take long naps during the day.   Where to find support  Consider talking with: ? Family members. ? Close friends. ? A mental health professional or therapist. ? A member of your church, faith, or community group.  Other sources of support include: ? Local support groups. Ask your health care provider about groups near you. ? Online support groups, such as those found through the American Heart Association: supportnetwork.heart.org ? Local home care agencies, community agencies, or social agencies. ? A palliative care specialist. Palliative care can help you manage symptoms, promote comfort, improve quality of life, and maintain dignity. Where to find more information  American Heart  Association: heart.org  National Heart, Lung, and Blood Institute: https://www.hartman-hill.biz/  Centers for Disease Control and Prevention: https://www.reeves.com/  [place]: SolutionApps.it Contact a health care provider if:  You have a rapid weight gain.  You have increasing shortness of breath that is unusual for you.  You are unable to participate in your usual physical activities.  You tire easily.  You have difficulty sleeping, such as: ? You wake up feeling short of breath. ? You have to use more pillows to raise your head in order to sleep.  You cough more than normal, especially with physical activity.  You have any swelling or more swelling in areas such as your hands, feet, ankles, or abdomen.  You become dizzy or light-headed when you stand up.  You have changes to your appetite.  You have symptoms of depression or anxiety. Get help right away if:  You have difficulty breathing.  You notice or your family notices a change in your awareness, such as having trouble staying awake or having difficulty with concentration.  You have pain or discomfort in your chest.  You have an  episode of fainting (syncope).  You feel like your heart is beating quickly (palpitations).  You have extreme feelings of sadness or loss of hope, or you have thoughts about hurting yourself or others. These symptoms may represent a serious problem that is an emergency. Do not wait to see if the symptoms will go away. Get medical help right away. Call your local emergency services (911 in the [place] Do not drive yourself to the hospital. Summary  There is no cure for heart failure, so it is important for you to take good care of yourself and follow the treatment plan set by your health care provider.  Ask your health care provider about attending cardiac rehabilitation. These programs include aerobic physical activity, which provides many benefits for your heart.  It is normal  to have many emotions about your diagnosis, such as fear, sadness, anger, and loss. If you feel any of these emotions and need help coping, contact your health care provider.  You may need to develop a plan with your health care provider if heart failure interferes with your ability to work. This information is not intended to replace advice given to you by your health care provider. Make sure you discuss any questions you have with your health care provider. Document Revised: 10/07/2019 Document Reviewed: 10/07/2019 Elsevier Patient Education  2021 [place].  Heart Failure Exacerbation  Heart failure is a condition in which the heart has trouble pumping blood. This may mean that the heart cannot pump enough blood out to the body or that the heart does not fill up with enough blood. When this happens, parts of the body do not get the blood and oxygen they need to function properly. This can cause symptoms such as breathing problems, tiredness (fatigue), swelling, and confusion. Heart failure exacerbation refers to heart failure symptoms that get worse. The symptoms may get worse suddenly or develop slowly over time. Heart failure exacerbation is a serious medical problem that should be treated right away. What are the causes? A heart failure exacerbation can be triggered by:  Not taking your heart failure medicines correctly.  Infections.  Eating an unhealthy diet or a diet that is high in salt (sodium).  Drinking too much fluid.  Drinking alcohol.  Using drugs, such as cocaine or methamphetamine.  Not exercising. Other causes include:  Other heart conditions such as an irregular heart rhythm (arrhythmia).  Worsening heart valve function.  Low blood counts (anemia).  Other medical problems, such as kidney failure, thyroid problems, or diabetes mellitus. Sometimes the cause of the exacerbation is not known. What are the signs or symptoms? When heart failure symptoms suddenly  or slowly get worse, this may be a sign of heart failure exacerbation. Symptoms of heart failure include:  Shortness of breath during activity or exercise.  A cough that does not go away.  Swelling of the legs, ankles, feet, or abdomen.  Losing or gaining weight for no reason.  Trouble breathing when lying down.  Increased heart rate or irregular heartbeat.  Fatigue.  Feeling light-headed, dizzy, or close to fainting.  Nausea or lack of appetite. How is this diagnosed? This condition is diagnosed based on:  Your symptoms and medical history.  A physical exam. You may also have tests, including:  Electrocardiogram (ECG). This test measures the electrical activity of your heart.  Echocardiogram. This test uses sound waves to take a picture of your heart to see how well it works.  Blood tests.  Imaging tests, such  as: ? Chest X-ray. ? MRI. ? Ultrasound.  Stress test. This test examines how well your heart functions while you exercise on a treadmill or exercise bike. If you cannot exercise, medicines may be used to increase your heartbeat in place of exercise.  Cardiac catheterization. During this test, a thin, flexible tube (catheter) is inserted into a blood vessel and threaded up to your heart. This test allows your health care provider to check the arteries that lead to your heart (coronary arteries).  Right heart catheterization. During this test, the pressure in your heart is measured. How is this treated? This condition may be treated by:  Adjusting your heart medicines.  Maintaining a healthy lifestyle. This includes: ? Eating a heart-healthy diet that is low in sodium. ? Not using products that contain nicotine or tobacco. ? Regular exercise. ? Monitoring your fluid intake. ? Monitoring your weight and reporting changes to your health care provider. ? Not using alcohol or drugs.  Treating sleep apnea, if you have this condition.  Surgery. This may  include: ? Placing a pacemaker to improve heart function (cardiac resynchronization therapy). ? Implanting a device that can correct heart rhythm problems (implantable cardioverter defibrillator). ? Implanting a pulmonary arterial pressure monitor to monitor your fluid balance. ? Connecting a device to your heart to help it pump blood (ventricular assist device). ? Heart transplant. Follow these instructions at home: Medicines  Take over-the-counter and prescription medicines only as told by your health care provider.  Do not stop taking your medicines or change the amount you take. If you are having problems or side effects from your medicines, talk to your health care provider.  If you are having difficulty paying for your medicines, contact a social worker or your clinic. There are many programs to assist with medicine costs.  Talk to your health care provider before starting any new medicines or supplements.  Make sure your health care provider and pharmacist have a list of all the medicines you are taking. Eating and drinking  Avoid drinking alcohol.  Eat a heart-healthy diet as told by your health care provider. This includes: ? Plenty of fruits and vegetables. ? Lean proteins. ? Low-fat dairy. ? Whole grains. ? Foods that are low in sodium.   Activity  Exercise regularly as told by your health care provider. Balance exercise with rest.  Ask your health care provider what activities are safe for you. This includes sexual activity, exercise, and daily tasks at home or work.   Lifestyle  Do not use any products that contain nicotine or tobacco. These products include cigarettes, chewing tobacco, and vaping devices, such as e-cigarettes. If you need help quitting, ask your health care provider.  Maintain a healthy weight. Ask your health care provider what weight is healthy for you.  Consider joining a patient support group. This can help with emotional problems you may  have, such as stress and anxiety.  Do not use drugs. General instructions  Stay up to date with vaccines. Talk to your health care provider about flu and pneumonia vaccines.  Keep a list of medicines that you are taking. This may help in emergency situations.  Keep all follow-up visits. This is important. Contact a health care provider if:  You have questions about your medicines or you miss a dose.  You feel anxious, depressed, or stressed.  You develop swelling in your feet, ankles, legs, or abdomen.  You develop a cough.  You have a fever.  You have  trouble sleeping.  You gain 2-3 lb (1-1.4 kg) in 24 hours or 5 lb (2.3 kg) in a week. Get help right away if:  You have chest pain or pressure.  You have shortness of breath while resting.  You have severe fatigue.  You are confused.  You have severe dizziness.  You have a rapid or irregular heartbeat.  You have nausea or you vomit.  You have a cough that is worse at night or you cannot lie flat.  You have severe depression or sadness. These symptoms may represent a serious problem that is an emergency. Do not wait to see if the symptoms will go away. Get medical help right away. Call your local emergency services (911 in the [place] Do not drive yourself to the hospital. Summary  When heart failure symptoms get worse, it is called heart failure exacerbation.  Common causes of this condition include taking medicines incorrectly, infections, and drinking alcohol.  This condition may be treated by adjusting medicines, maintaining a healthy lifestyle, or surgery.  Do not stop taking your medicines or change the amount you take. If you are having problems or side effects from your medicines, talk to your health care provider. This information is not intended to replace advice given to you by your health care provider. Make sure you discuss any questions you have with your health care provider. Document Revised:  09/14/2019 Document Reviewed: 09/14/2019 Elsevier Patient Education  2021 [place].  Peripheral Edema  Peripheral edema is swelling that is caused by a buildup of fluid. Peripheral edema most often affects the lower legs, ankles, and feet. It can also develop in the arms, hands, and face. The area of the body that has peripheral edema will look swollen. It may also feel heavy or warm. Your clothes may start to feel tight. Pressing on the area may make a temporary dent in your skin. You may not be able to move your swollen arm or leg as much as usual. There are many causes of peripheral edema. It can happen because of a complication of other conditions such as congestive heart failure, kidney disease, or a problem with your blood circulation. It also can be a side effect of certain medicines or because of an infection. It often happens to women during pregnancy. Sometimes, the cause is not known. Follow these instructions at home: Managing pain, stiffness, and swelling  Raise (elevate) your legs while you are sitting or lying down.  Move around often to prevent stiffness and to lessen swelling.  Do not sit or stand for long periods of time.  Wear support stockings as told by your health care provider.   Medicines  Take over-the-counter and prescription medicines only as told by your health care provider.  Your health care provider may prescribe medicine to help your body get rid of excess water (diuretic). General instructions  Pay attention to any changes in your symptoms.  Follow instructions from your health care provider about limiting salt (sodium) in your diet. Sometimes, eating less salt may reduce swelling.  Moisturize skin daily to help prevent skin from cracking and draining.  Keep all follow-up visits as told by your health care provider. This is important. Contact a health care provider if you have:  A fever.  Edema that starts suddenly or is getting worse,  especially if you are pregnant or have a medical condition.  Swelling in only one leg.  Increased swelling, redness, or pain in one or both of your legs.  Drainage or sores at the area where you have edema. Get help right away if you:  Develop shortness of breath, especially when you are lying down.  Have pain in your chest or abdomen.  Feel weak.  Feel faint. Summary  Peripheral edema is swelling that is caused by a buildup of fluid. Peripheral edema most often affects the lower legs, ankles, and feet.  Move around often to prevent stiffness and to lessen swelling. Do not sit or stand for long periods of time.  Pay attention to any changes in your symptoms.  Contact a health care provider if you have edema that starts suddenly or is getting worse, especially if you are pregnant or have a medical condition.  Get help right away if you develop shortness of breath, especially when lying down. This information is not intended to replace advice given to you by your health care provider. Make sure you discuss any questions you have with your health care provider. Document Revised: 11/15/2017 Document Reviewed: 11/15/2017 Elsevier Patient Education  2021 [place] Exercises Ask your health care provider which exercises are safe for you. Do exercises exactly as told by your health care provider and adjust them as directed. It is normal to feel mild stretching, pulling, tightness, or discomfort as you do these exercises. Stop right away if you feel sudden pain or your pain gets worse. Do not begin these exercises until told by your health care provider. Benefits of core strength exercises Core exercises help to build strength in the muscles between your ribs and your hips (abdominal muscles). These muscles help to support your body and keep your spine stable. It is important to maintain strength in your core to prevent injury and pain. Some activities, such as yoga and  Pilates, can help to strengthen core muscles. You can also strengthen core muscles with exercises at home. It is important to talk to your health care provider before you start a new exercise routine. Core strength exercises can:  Reduce back pain.  Help to rebuild strength after a back or spine injury.  Help to prevent injury during physical activity, especially injuries to the back, hips, and knees. How to do core strength exercises Repeat these exercises 10-15 times, or until you are tired. Stop if you feel any pain while doing these exercises. Contact your health care provider if your pain continues or gets worse while doing or after doing core strength exercises. For strength exercises that are done on the floor, use a padded yoga mat or an exercise mat. Bridging 1. Lie on your back on a firm surface with your knees bent and your feet flat on the floor. 2. Raise your hips so that your knees, hips, and shoulders together form a straight line. Do not excessively arch your back. Keep your abdominal muscles tight. 3. Hold this position for 3-5 seconds. 4. Slowly lower your hips to the starting position. 5. Let your muscles relax completely between repetitions.   Single-leg bridge 1. Lie on your back on a firm surface with your knees bent and your feet flat on the floor. 2. Raise your hips so that your knees, hips, and shoulders together form a straight line. Do not excessively arch your back. Keep your abdominal muscles tight. 3. Lift one foot off the floor while maintaining alignment in your knees, hips, and shoulders. Then, completely straighten the lifted leg. 4. Hold this position for 3-5 seconds. 5. Put the straight leg back down in the  bent position. 6. Slowly lower your hips to the starting position. 7. Repeat these steps using your other leg. Side bridge 1. Lie on your side with your knees bent. Prop yourself up on the elbow that is near the floor. 2. Using your abdominal muscles  and the elbow you are propped up on, raise your body off the floor. Raise your hip so that your shoulder, hip, and foot together form a straight line. 3. Hold this position for 10 seconds. Keep your head and neck raised and away from your shoulder (in their normal, neutral position). Keep your abdominal muscles tight. 4. Slowly lower your hip to the starting position. 5. Repeat and try to hold this position longer, working your way up to 30 seconds. Abdominal crunch 1. Lie on your back on a firm surface. Bend your knees and keep your feet flat on the floor. 2. Cross your arms over your chest. 3. Without bending your neck, tip your chin slightly toward your chest. 4. Tighten your abdominal muscles as you lift your chest just high enough to lift your shoulder blades off the floor. Do not hold your breath. You can do this with short lifts or long lifts. 5. Slowly return to the starting position. Bird dog 1. Get on your hands and knees, with your legs shoulder-width apart and your arms under your shoulders. Keep your back straight. 2. Tighten your abdominal muscles. 3. Raise one of your legs off the floor and straighten it. Try to keep it parallel to the floor. 4. Slowly lower your leg to the starting position. 5. Raise one of your arms off the floor and straighten it. Try to keep it parallel to the floor. 6. Slowly lower your arm to the starting position. 7. Repeat with the other arm and leg. If possible, try raising a leg and an arm at the same time, on opposite sides of the body. For example, raise your left hand and your right leg.   Plank 1. Lie on your belly. 2. Prop up your body onto your forearms and your feet, keeping your legs straight. Your body should make a straight line between your shoulders and feet. 3. Hold this position for 10 seconds while keeping your abdominal muscles tight. 4. Lower your body to the starting position. 5. Repeat and try to hold this position longer, working  your way up to 30 seconds.   Cross-core strengthening 1. Stand with your feet shoulder-width apart. 2. Hold a ball out in front of you. Keep your arms straight. 3. Tighten your abdominal muscles and slowly rotate at your waist from side to side. Keep your feet flat. 4. Once you are comfortable, try repeating this exercise with a heavier ball. Top core strengthening 1. Stand about 18 inches (46 cm) out from a wall, with your back to the wall. 2. Keep your feet flat and shoulder-width apart. 3. Tighten your abdominal muscles. 4. Bend your hips and knees. 5. Slowly reach between your legs to touch the wall behind you. 6. Slowly stand back up. 7. Raise your arms over your head and reach behind you. 8. Return to the starting position. General tips  Do not do any exercises that cause pain. If you have pain while exercising, talk to your health care provider.  Always stretch before and after doing these exercises. This can help prevent injury.  Maintain a healthy weight. Ask your health care provider what weight is healthy for you. Contact a health care provider if:  You  have back pain that gets worse or does not go away.  You feel pain while doing core strength exercises. Get help right away if:  You have severe pain that does not get better with medicine. Summary  Core exercises help to build strength in the muscles between your ribs and your waist.  Core muscles help to support your body and keep your spine stable.  Some activities, such as yoga and Pilates, can help to strengthen core muscles.  Core strength exercises can help back pain and can prevent injury.  Stop if you feel any pain while doing core strength exercises. This information is not intended to replace advice given to you by your health care provider. Make sure you discuss any questions you have with your health care provider. Document Revised: 11/19/2019 Document Reviewed: 11/19/2019 Elsevier Patient Education   2021 [employer].

## 2020-04-16 DIAGNOSIS — M9902 Segmental and somatic dysfunction of thoracic region: Secondary | ICD-10-CM | POA: Diagnosis not present

## 2020-04-16 DIAGNOSIS — M9901 Segmental and somatic dysfunction of cervical region: Secondary | ICD-10-CM | POA: Diagnosis not present

## 2020-04-16 DIAGNOSIS — M62830 Muscle spasm of back: Secondary | ICD-10-CM | POA: Diagnosis not present

## 2020-04-16 DIAGNOSIS — M9903 Segmental and somatic dysfunction of lumbar region: Secondary | ICD-10-CM | POA: Diagnosis not present

## 2020-04-21 ENCOUNTER — Other Ambulatory Visit: Payer: Self-pay

## 2020-04-21 ENCOUNTER — Ambulatory Visit (INDEPENDENT_AMBULATORY_CARE_PROVIDER_SITE_OTHER)
Admission: RE | Admit: 2020-04-21 | Discharge: 2020-04-21 | Disposition: A | Discharge status: Home / Self Care | Payer: Medicare PPO | Source: Ambulatory Visit | Attending: Family Medicine | Admitting: Family Medicine

## 2020-04-21 ENCOUNTER — Ambulatory Visit (INDEPENDENT_AMBULATORY_CARE_PROVIDER_SITE_OTHER): Payer: Medicare PPO | Admitting: *Deleted

## 2020-04-21 DIAGNOSIS — Z952 Presence of prosthetic heart valve: Secondary | ICD-10-CM | POA: Diagnosis not present

## 2020-04-21 DIAGNOSIS — Z5181 Encounter for therapeutic drug level monitoring: Secondary | ICD-10-CM

## 2020-04-21 DIAGNOSIS — Z780 Asymptomatic menopausal state: Secondary | ICD-10-CM

## 2020-04-21 LAB — POCT INR: INR: 2.4 (ref 2.0–3.0)

## 2020-04-21 NOTE — Patient Instructions (Signed)
Description   Continue taking Warfarin 1/2 tablet daily except 1 tablet on Tuesdays, Thursdays, and Saturdays. Recheck in 5 weeks. [facility] [phone]

## 2020-04-23 ENCOUNTER — Ambulatory Visit: Payer: Medicare PPO | Attending: Family Medicine | Admitting: Physical Therapy

## 2020-04-23 ENCOUNTER — Encounter: Payer: Self-pay | Admitting: Physical Therapy

## 2020-04-23 ENCOUNTER — Other Ambulatory Visit: Payer: Self-pay

## 2020-04-23 DIAGNOSIS — R296 Repeated falls: Secondary | ICD-10-CM | POA: Insufficient documentation

## 2020-04-23 DIAGNOSIS — M6281 Muscle weakness (generalized): Secondary | ICD-10-CM | POA: Insufficient documentation

## 2020-04-23 DIAGNOSIS — R2681 Unsteadiness on feet: Secondary | ICD-10-CM | POA: Diagnosis not present

## 2020-04-23 NOTE — Therapy (Signed)
[facility] [place], [place], [address] Phone: [phone]   Fax:  [phone]  Physical Therapy Evaluation  Patient Details  Name: Amanda Wiggins MRN: [id number] Date of Birth: 12/23/1945 Referring Provider (PT): [name], MD   Encounter Date: 04/23/2020   PT End of Session - 04/23/20 1408    Visit Number 1    Number of Visits 12    Date for PT Re-Evaluation 06/11/20    Authorization Type [employer] Medicare (CQ & KX modifier) Progress note every 10th visit    PT Start Time 1300    PT Stop Time 1345    PT Time Calculation (min) 45 min    Activity Tolerance Patient tolerated treatment well    Behavior During Therapy [facility] for tasks assessed/performed           Past Medical History:  Diagnosis Date  . Aortic valve replaced   . Breast cancer ([place]) 2005  . FHx: migraine headaches   . GERD (gastroesophageal reflux disease)   . Hiatal hernia   . Hx Breast Cancer, IDC, Stage I 07/03/2003  . Hx of breast cancer   . Hyperlipidemia   . Hypertension   . Menopausal syndrome   . Osteopenia   . 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 Personal history of radiation therapy   . Status post aortic valve repair     Past Surgical History:  Procedure Laterality Date  . AORTIC VALVE REPLACEMENT  2003  . BREAST BIOPSY    . BREAST LUMPECTOMY Left 2005  . CARDIAC CATHETERIZATION     Ejection Fraction   . CARDIAC CATHETERIZATION N/A 05/16/2015   Procedure: Left Heart Cath and Coronary Angiography;  Surgeon: Leonie Man, MD;  Location: [place] [facility];  Service: Cardiovascular;  Laterality: N/A;  . CARDIAC CATHETERIZATION  05/16/2015   Procedure: Coronary Stent Intervention;  Surgeon: Leonie Man, MD;  Location: [place] [facility];  Service: Cardiovascular;;  . CARDIAC CATHETERIZATION N/A 05/19/2015   Procedure: Left Heart Cath and Coronary Angiography;  Surgeon: Troy Sine, MD;  Location: [place]  [facility];  Service: Cardiovascular;  Laterality: N/A;  . CARDIAC CATHETERIZATION N/A 05/19/2015   Procedure: Coronary Stent Intervention;  Surgeon: Troy Sine, MD;  Location: [place] [facility];  Service: Cardiovascular;  Laterality: N/A;  . HIATAL HERNIA REPAIR    . HIATAL HERNIA REPAIR  05/11/2007  . hysterectomy, endometiral cancer  2001  . lummpectomy breast cancer  07/03/2003  . nissan fundoplicaiton  4315   post-op hematoma on heparin   . right shouler surgery      There were no vitals filed for this visit.    Subjective Assessment - 04/23/20 1401    Subjective COVID-19 screening performed upon arrival. Patient arrives to physical therapy with reports of falls and LE weakness that progressively worsened over the past couple of years. Patient had skilled physical therapy with significant improvements with strength and balance but has not been consistent with HEP and exercises program. Patient reports 3 falls in the past 6 months with the last fall occurring when she tripped over the gas line at the service station. Patient is able to come to standing independently though with increased time. Patient reports ability to perform all ADLs and home activities but with reports of soreness after secondary to weakness and muscle fatigue. Patient's goals are to improve movement, improve strength, improve balance, improve standing and walking tolerance, and improve ability to  perform home activities and yard work and return to exercise program.    Pertinent History Latex allergy, fall risk, HTN, CHF, osteopenia, history of heart surgery    Limitations Walking;House hold activities    Patient Stated Goals improve balance and strength and return to exercise group programs.    Currently in Pain? No/denies              [facility] PT Assessment - 04/23/20 0001      Assessment   Medical Diagnosis Balance problem    Referring Provider (PT) [name], MD    Onset Date/Surgical Date --   ongoing  since 2018   Next MD Visit 10/07/2020    Prior Therapy yes      Precautions   Precautions Fall      Restrictions   Weight Bearing Restrictions No      Balance Screen   Has the patient fallen in the past 6 months Yes    How many times? 3    Has the patient had a decrease in activity level because of a fear of falling?  Yes    Is the patient reluctant to leave their home because of a fear of falling?  No      Home Environment   Living Environment Private residence    Living Arrangements Alone    Type of [place]      Prior Function   Level of Independence Independent      ROM / Strength   AROM / PROM / Strength Strength      Strength   Overall Strength Deficits    Strength Assessment Site Hip;Knee;Ankle    Right/Left Hip Right;Left    Right Hip Flexion 3+/5    Right Hip Extension 3-/5    Right Hip ABduction 3-/5    Left Hip Flexion 3+/5    Left Hip Extension 3-/5    Left Hip ABduction 3-/5    Right/Left Knee Left;Right    Right Knee Flexion 4/5    Right Knee Extension 4-/5    Left Knee Flexion 4/5    Left Knee Extension 4-/5    Right/Left Ankle Right;Left    Right Ankle Dorsiflexion 4-/5    Right Ankle Plantar Flexion 3+/5    Right Ankle Inversion 4-/5    Right Ankle Eversion 4-/5    Left Ankle Dorsiflexion 4-/5    Left Ankle Plantar Flexion 3+/5    Left Ankle Inversion 4-/5    Left Ankle Eversion 4-/5      Ambulation/Gait   Gait Pattern Within Functional Limits      Standardized Balance Assessment   Standardized Balance Assessment Five Times Sit to Stand;Berg Balance Test    Five times sit to stand comments  12.33 seconds no UE support      Berg Balance Test   Sit to Stand Able to stand without using hands and stabilize independently    Standing Unsupported Able to stand safely 2 minutes    Sitting with Back Unsupported but Feet Supported on Floor or Stool Able to sit safely and securely 2 minutes    Stand to Sit Sits safely with minimal use of hands     Transfers Able to transfer safely, minor use of hands    Standing Unsupported with Eyes Closed Able to stand 10 seconds with supervision    Standing Unsupported with Feet Together Able to place feet together independently and stand for 1 minute with supervision    From Standing, Reach Forward  with Outstretched Arm Can reach confidently >25 cm (10")    From Standing Position, Pick up Object from [place] to pick up shoe safely and easily    From Standing Position, Turn to Look Behind Over each Shoulder Looks behind from both sides and weight shifts well    Turn 360 Degrees Able to turn 360 degrees safely one side only in 4 seconds or less    Standing Unsupported, Alternately Place Feet on Step/Stool Able to stand independently and safely and complete 8 steps in 20 seconds    Standing Unsupported, One Foot in Front Able to plae foot ahead of the other independently and hold 30 seconds    Standing on One Leg Able to lift leg independently and hold > 10 seconds    Total Score 52    Berg comment: low fall risk                      Objective measurements completed on examination: See above findings.               PT Education - 04/23/20 1407    Education Details heel toe raises with muscle elongation, bridges with pillow squeeze, sidelying clam shells, hip extension    Person(s) Educated Patient    Methods Explanation;Demonstration;Handout    Comprehension Verbalized understanding;Returned demonstration               PT Long Term Goals - 04/23/20 1418      PT LONG TERM GOAL #1   Title Patient will be independent with HEP    Time 6    Period Weeks    Status New      PT LONG TERM GOAL #2   Title Patient will demonstrate 4/5 or greater bilateral LE MMT to improve stability during functional tasks.    Time 6    Period Weeks    Status New      PT LONG TERM GOAL #3   Title Patient will perform 5x sit to stand with no UE support in 10 seconds or less to  improve functional LE strength.    Baseline --    Period Weeks    Status Achieved      PT LONG TERM GOAL #4   Title Patient will decrease risk of falls as noted by a 56/56 score on the Berg Balance Scale    Time 6    Period Weeks    Status New                  Plan - 04/23/20 1409    Clinical Impression Statement Patient is a 75 year old female who presents to physical therapy with decreased balance and decreased LE MMT that has progressed over the last couple of years. Patient's 5x sit to stand without UE support time of 12.3 seconds is within age matched norms. Patient's 52 of 56 score on the Berg Balance scale categorizes her as a low fall risk. Patient and PT discussed HEP and plan of care to which patient reported understanding. Patient would benefit from skilled physical therapy to address deficits and address patient's goals.    Personal Factors and Comorbidities Comorbidity 3+;Age;Time since onset of injury/illness/exacerbation    Comorbidities Latex allergy, fall risk, HTN, CHF, osteopenia, history of heart surgery    Examination-Activity Limitations Locomotion Level    Examination-Participation Restrictions Yard Work    Stability/Clinical Decision Making Stable/Uncomplicated    Clinical Decision Making Low  Rehab Potential Excellent    PT Frequency 2x / week    PT Duration 6 weeks    PT Treatment/Interventions ADLs/Self Care Home Management;Gait training;Stair training;Functional mobility training;Therapeutic activities;Therapeutic exercise;Balance training;Neuromuscular re-education;Passive range of motion;Patient/family education    PT Next Visit Plan review mini BESTest; nustep, LE strengthening, core stability and strengthening, balance activities.    PT Home Exercise Plan see patient education section    Consulted and Agree with Plan of Care Patient           Patient will benefit from skilled therapeutic intervention in order to improve the following deficits  and impairments:  Decreased activity tolerance,Decreased balance,Decreased strength,Decreased mobility,Postural dysfunction,Decreased safety awareness  Visit Diagnosis: Unsteadiness on feet - Plan: PT plan of care cert/re-cert  Muscle weakness (generalized) - Plan: PT plan of care cert/re-cert  Repeated falls - Plan: PT plan of care cert/re-cert     Problem List Patient Active Problem List   Diagnosis Date Noted  . Acute on chronic combined systolic and diastolic CHF (congestive heart failure) ([place]) 11/07/2019  . Chronic systolic CHF (congestive heart failure) ([place]) 05/23/2015  . CAD S/P urgent PCI 05/16/15 05/19/2015  . Cardiomyopathy, ischemic 05/19/2015  . ST elevation (STEMI) myocardial infarction involving left anterior descending coronary artery ([place])   . Acute ST elevation myocardial infarction (STEMI) of anterolateral wall ([place]) 05/16/2015  . Encounter for therapeutic drug monitoring 07/19/2013  . 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 Aortic valve replaced 01/25/2011  . Long term current use of anticoagulant therapy 06/25/2010  . INFECTIOUS DIARRHEA 03/20/2008  . DIARRHEA-PRESUMED INFECTIOUS 03/20/2008  . URTICARIA 03/13/2008  . FATIGUE 09/12/2007  . DIARRHEA 08/29/2007  . ABDOMINAL PAIN, GENERALIZED, CHRONIC 08/29/2007  . Allergic rhinitis 05/09/2007  . HIATAL HERNIA 05/09/2007  . ARTHRITIS 05/09/2007  . OSTEOPENIA 05/09/2007  . GERD 01/23/2007  . CANCER, ENDOMETRIUM 07/25/2006  . Dyslipidemia 07/25/2006  . COMMON MIGRAINE 07/25/2006  . Essential hypertension 07/25/2006  . Hx Breast Cancer, IDC, Stage I 07/03/2003    Gabriela Eves, PT, DPT 04/23/2020, 2:42 PM  [facility] [address] [place], [place], [address] Phone: [phone]   Fax:  [phone]  Name: Amanda Wiggins MRN: [id number] Date of Birth: March 17, 1945

## 2020-04-28 ENCOUNTER — Ambulatory Visit: Payer: Medicare PPO | Admitting: *Deleted

## 2020-04-28 ENCOUNTER — Other Ambulatory Visit: Payer: Self-pay

## 2020-04-28 DIAGNOSIS — R2681 Unsteadiness on feet: Secondary | ICD-10-CM | POA: Diagnosis not present

## 2020-04-28 DIAGNOSIS — M6281 Muscle weakness (generalized): Secondary | ICD-10-CM | POA: Diagnosis not present

## 2020-04-28 DIAGNOSIS — R296 Repeated falls: Secondary | ICD-10-CM

## 2020-04-28 NOTE — Therapy (Signed)
[facility] [place], [place], [address] Phone: [phone]   Fax:  [phone]  Physical Therapy Treatment  Patient Details  Name: Amanda Wiggins MRN: [id number] Date of Birth: 20-Jul-1945 Referring Provider (PT): [name], MD   Encounter Date: 04/28/2020   PT End of Session - 04/28/20 0902    Visit Number 2    Number of Visits 12    Date for PT Re-Evaluation 06/11/20    Authorization Type [employer] Medicare (CQ & KX modifier) Progress note every 10th visit    PT Start Time 0900    PT Stop Time 0948    PT Time Calculation (min) 48 min           Past Medical History:  Diagnosis Date  . Aortic valve replaced   . Breast cancer ([place]) 2005  . FHx: migraine headaches   . GERD (gastroesophageal reflux disease)   . Hiatal hernia   . Hx Breast Cancer, IDC, Stage I 07/03/2003  . Hx of breast cancer   . Hyperlipidemia   . Hypertension   . Menopausal syndrome   . Osteopenia   . 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 Personal history of radiation therapy   . Status post aortic valve repair     Past Surgical History:  Procedure Laterality Date  . AORTIC VALVE REPLACEMENT  2003  . BREAST BIOPSY    . BREAST LUMPECTOMY Left 2005  . CARDIAC CATHETERIZATION     Ejection Fraction   . CARDIAC CATHETERIZATION N/A 05/16/2015   Procedure: Left Heart Cath and Coronary Angiography;  Surgeon: Leonie Man, MD;  Location: [place] [facility];  Service: Cardiovascular;  Laterality: N/A;  . CARDIAC CATHETERIZATION  05/16/2015   Procedure: Coronary Stent Intervention;  Surgeon: Leonie Man, MD;  Location: [place] [facility];  Service: Cardiovascular;;  . CARDIAC CATHETERIZATION N/A 05/19/2015   Procedure: Left Heart Cath and Coronary Angiography;  Surgeon: Troy Sine, MD;  Location: [place] [facility];  Service: Cardiovascular;  Laterality: N/A;  . CARDIAC CATHETERIZATION N/A 05/19/2015   Procedure: Coronary  Stent Intervention;  Surgeon: Troy Sine, MD;  Location: [place] [facility];  Service: Cardiovascular;  Laterality: N/A;  . HIATAL HERNIA REPAIR    . HIATAL HERNIA REPAIR  05/11/2007  . hysterectomy, endometiral cancer  2001  . lummpectomy breast cancer  07/03/2003  . nissan fundoplicaiton  1517   post-op hematoma on heparin   . right shouler surgery      There were no vitals filed for this visit.   Subjective Assessment - 04/28/20 0901    Subjective COVID-19 screening performed upon arrival.    Pertinent History Latex allergy, fall risk, HTN, CHF, osteopenia, history of heart surgery    Limitations Walking;House hold activities    Patient Stated Goals improve balance and strength and return to exercise group programs.    Currently in Pain? No/denies                             [facility] Adult PT Treatment/Exercise - 04/28/20 0001      Exercises   Exercises Lumbar;Knee/Hip      Lumbar Exercises: Aerobic   Nustep L4 x 10 mins      Knee/Hip Exercises: Standing   Heel Raises Limitations Toe raises 2x20    Hip Abduction AROM;Both;2 sets;10 reps;Knee straight    Lateral Step Up Both;1 set;10 reps;Hand Hold:  2;Step Height: 6"    Forward Step Up Both;1 set;10 reps;Step Height: 6";Hand Hold: 0    Rocker Board 3 minutes    Other Standing Knee Exercises 8in step toe taps x10 each    Other Standing Knee Exercises side stepping on balance beam, forward/ retro walk on balance beam      Knee/Hip Exercises: Seated   Sit to Sand 2 sets;10 reps;without UE support                       PT Wiggins Term Goals - 04/23/20 1418      PT Wiggins TERM GOAL #1   Title Patient will be independent with HEP    Time 6    Period Weeks    Status New      PT Wiggins TERM GOAL #2   Title Patient will demonstrate 4/5 or greater bilateral LE MMT to improve stability during functional tasks.    Time 6    Period Weeks    Status New      PT Wiggins TERM GOAL #3   Title Patient  will perform 5x sit to stand with no UE support in 10 seconds or less to improve functional LE strength.    Baseline --    Period Weeks    Status Achieved      PT Wiggins TERM GOAL #4   Title Patient will decrease risk of falls as noted by a 56/56 score on the Berg Balance Scale    Time 6    Period Weeks    Status New                 Plan - 04/28/20 8099    Clinical Impression Statement Pt arrived today doing fairly well. Rx focused on Balance as well as Bil. LE strengthening. She did well with mainly fatigue. CGA / SBA with all balance exs.    Personal Factors and Comorbidities Comorbidity 3+;Age;Time since onset of injury/illness/exacerbation    Comorbidities Latex allergy, fall risk, HTN, CHF, osteopenia, history of heart surgery    Rehab Potential Excellent    PT Frequency 2x / week    PT Duration 6 weeks    PT Treatment/Interventions ADLs/Self Care Home Management;Gait training;Stair training;Functional mobility training;Therapeutic activities;Therapeutic exercise;Balance training;Neuromuscular re-education;Passive range of motion;Patient/family education    PT Next Visit Plan review mini BESTest; nustep, LE strengthening, core stability and strengthening, balance activities.           Patient will benefit from skilled therapeutic intervention in order to improve the following deficits and impairments:  Decreased activity tolerance,Decreased balance,Decreased strength,Decreased mobility,Postural dysfunction,Decreased safety awareness  Visit Diagnosis: Unsteadiness on feet  Muscle weakness (generalized)  Repeated falls     Problem List Patient Active Problem List   Diagnosis Date Noted  . Acute on chronic combined systolic and diastolic CHF (congestive heart failure) ([place]) 11/07/2019  . Chronic systolic CHF (congestive heart failure) ([place]) 05/23/2015  . CAD S/P urgent PCI 05/16/15 05/19/2015  . Cardiomyopathy, ischemic 05/19/2015  . ST elevation (STEMI) myocardial  infarction involving left anterior descending coronary artery ([place])   . Acute ST elevation myocardial infarction (STEMI) of anterolateral wall ([place]) 05/16/2015  . Encounter for therapeutic drug monitoring 07/19/2013  . 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 Aortic valve replaced 01/25/2011  . Wiggins term current use of anticoagulant therapy 06/25/2010  . INFECTIOUS DIARRHEA 03/20/2008  . DIARRHEA-PRESUMED INFECTIOUS 03/20/2008  . URTICARIA 03/13/2008  . FATIGUE 09/12/2007  .  DIARRHEA 08/29/2007  . ABDOMINAL PAIN, GENERALIZED, CHRONIC 08/29/2007  . Allergic rhinitis 05/09/2007  . HIATAL HERNIA 05/09/2007  . ARTHRITIS 05/09/2007  . OSTEOPENIA 05/09/2007  . GERD 01/23/2007  . CANCER, ENDOMETRIUM 07/25/2006  . Dyslipidemia 07/25/2006  . COMMON MIGRAINE 07/25/2006  . Essential hypertension 07/25/2006  . Hx Breast Cancer, IDC, Stage I 07/03/2003    Creig Landin,CHRIS, PTA 04/28/2020, 10:19 AM  [facility] [place], [place], [address] Phone: [phone]   Fax:  [phone]  Name: TATISHA CERINO MRN: [id number] Date of Birth: 04-11-1945

## 2020-05-01 ENCOUNTER — Other Ambulatory Visit: Payer: Self-pay

## 2020-05-01 ENCOUNTER — Encounter: Payer: Self-pay | Admitting: Physical Therapy

## 2020-05-01 ENCOUNTER — Ambulatory Visit: Payer: Medicare PPO | Admitting: Physical Therapy

## 2020-05-01 DIAGNOSIS — R296 Repeated falls: Secondary | ICD-10-CM

## 2020-05-01 DIAGNOSIS — R2681 Unsteadiness on feet: Secondary | ICD-10-CM

## 2020-05-01 DIAGNOSIS — M6281 Muscle weakness (generalized): Secondary | ICD-10-CM | POA: Diagnosis not present

## 2020-05-01 NOTE — Therapy (Signed)
[facility] [place], [place], [address] Phone: [phone]   Fax:  [phone]  Physical Therapy Treatment  Patient Details  Name: Amanda Wiggins MRN: [id number] Date of Birth: [birth year] Referring Provider (PT): [name], MD   Encounter Date: 05/01/2020   PT End of Session - 05/01/20 1034    Visit Number 3    Number of Visits 12    Date for PT Re-Evaluation 06/11/20    Authorization Type [employer] (CQ & KX modifier) Progress note every 10th visit    PT Start Time 1031    PT Stop Time 1113    PT Time Calculation (min) 42 min    Activity Tolerance Patient tolerated treatment well    Behavior During Therapy [facility] for tasks assessed/performed           Past Medical History:  Diagnosis Date  . Aortic valve replaced   . Breast cancer ([place]) 2005  . FHx: migraine headaches   . GERD (gastroesophageal reflux disease)   . Hiatal hernia   . Hx Breast Cancer, IDC, Stage I 07/03/2003  . Hx of breast cancer   . Hyperlipidemia   . Hypertension   . Menopausal syndrome   . Osteopenia   . 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 Personal history of radiation therapy   . Status post aortic valve repair     Past Surgical History:  Procedure Laterality Date  . AORTIC VALVE REPLACEMENT  2003  . BREAST BIOPSY    . BREAST LUMPECTOMY Left 2005  . CARDIAC CATHETERIZATION     Ejection Fraction   . CARDIAC CATHETERIZATION N/A 05/16/2015   Procedure: Left Heart Cath and Coronary Angiography;  Surgeon: Leonie Man, MD;  Location: [place] [facility];  Service: Cardiovascular;  Laterality: N/A;  . CARDIAC CATHETERIZATION  05/16/2015   Procedure: Coronary Stent Intervention;  Surgeon: Leonie Man, MD;  Location: [place] [facility];  Service: Cardiovascular;;  . CARDIAC CATHETERIZATION N/A 05/19/2015   Procedure: Left Heart Cath and Coronary Angiography;  Surgeon: Troy Sine, MD;  Location: [place] [facility]  LAB;  Service: Cardiovascular;  Laterality: N/A;  . CARDIAC CATHETERIZATION N/A 05/19/2015   Procedure: Coronary Stent Intervention;  Surgeon: Troy Sine, MD;  Location: [place] [facility];  Service: Cardiovascular;  Laterality: N/A;  . HIATAL HERNIA REPAIR    . HIATAL HERNIA REPAIR  05/11/2007  . hysterectomy, endometiral cancer  2001  . lummpectomy breast cancer  07/03/2003  . nissan fundoplicaiton  [id number]   post-op hematoma on heparin   . right shouler surgery      There were no vitals filed for this visit.   Subjective Assessment - 05/01/20 1033    Subjective COVID-19 screening performed upon arrival. May try yoga after PT and get back into exercises.    Pertinent History Latex allergy, fall risk, HTN, CHF, osteopenia, history of heart surgery    Limitations Walking;House hold activities    Patient Stated Goals improve balance and strength and return to exercise group programs.    Currently in Pain? No/denies              [facility] PT Assessment - 05/01/20 0001      Assessment   Medical Diagnosis Balance problem    Referring Provider (PT) [name], MD    Next MD Visit 10/07/2020    Prior Therapy yes      Precautions   Precautions Fall  Restrictions   Weight Bearing Restrictions No                         [facility] Adult PT Treatment/Exercise - 05/01/20 0001      Standardized Balance Assessment   Standardized Balance Assessment Mini-BESTest      Knee/Hip Exercises: Aerobic   Nustep L4 x11 min      Knee/Hip Exercises: Standing   Heel Raises Both;20 reps    Heel Raises Limitations Toe raises 2x20    Knee Flexion Strengthening;Both;2 sets;10 reps    Knee Flexion Limitations 2#    Hip Flexion Stengthening;Both;2 sets;10 reps;Knee bent    Hip Flexion Limitations 2#    Hip Abduction Stengthening;Both;2 sets;10 reps;Knee straight;Limitations    Abduction Limitations 2#    Forward Step Up Both;2 sets;10 reps;Hand Hold: 2;Step Height: 6"    Rocker  Board 5 minutes               Balance Exercises - 05/01/20 0001      Balance Exercises: Standing   Standing Eyes Opened Narrow base of support (BOS);Foam/compliant surface;Time    Standing Eyes Opened Time 3 min    Tandem Gait Forward;Intermittent upper extremity support;5 reps;Foam/compliant surface    Sidestepping Foam/compliant support;Upper extremity support;5 reps                  PT Long Term Goals - 04/23/20 1418      PT LONG TERM GOAL #1   Title Patient will be independent with HEP    Time 6    Period Weeks    Status New      PT LONG TERM GOAL #2   Title Patient will demonstrate 4/5 or greater bilateral LE MMT to improve stability during functional tasks.    Time 6    Period Weeks    Status New      PT LONG TERM GOAL #3   Title Patient will perform 5x sit to stand with no UE support in 10 seconds or less to improve functional LE strength.    Baseline --    Period Weeks    Status Achieved      PT LONG TERM GOAL #4   Title Patient will decrease risk of falls as noted by a 56/56 score on the Berg Balance Scale    Time 6    Period Weeks    Status New                 Plan - 05/01/20 1216    Clinical Impression Statement Patient presented in clinic with no complaints. Patient still reporting some imbalance and hip weakness that was notable in last bone density scan. Patient progressed through lightly resisted hip strengthening in which she noted fatigue. Patient able to stabilize well on airex with various tasks.    Personal Factors and Comorbidities Comorbidity 3+;Age;Time since onset of injury/illness/exacerbation    Comorbidities Latex allergy, fall risk, HTN, CHF, osteopenia, history of heart surgery    Examination-Activity Limitations Locomotion Level    Examination-Participation Restrictions Yard Work    Stability/Clinical Decision Making Stable/Uncomplicated    Rehab Potential Excellent    PT Frequency 2x / week    PT Duration 6 weeks     PT Treatment/Interventions ADLs/Self Care Home Management;Gait training;Stair training;Functional mobility training;Therapeutic activities;Therapeutic exercise;Balance training;Neuromuscular re-education;Passive range of motion;Patient/family education    PT Next Visit Plan review mini BESTest and complete;    PT Home Exercise Plan  see patient education section    Consulted and Agree with Plan of Care Patient           Patient will benefit from skilled therapeutic intervention in order to improve the following deficits and impairments:  Decreased activity tolerance,Decreased balance,Decreased strength,Decreased mobility,Postural dysfunction,Decreased safety awareness  Visit Diagnosis: Unsteadiness on feet  Muscle weakness (generalized)  Repeated falls     Problem List Patient Active Problem List   Diagnosis Date Noted  . Acute on chronic combined systolic and diastolic CHF (congestive heart failure) ([place]) 11/07/2019  . Chronic systolic CHF (congestive heart failure) ([place]) 05/23/2015  . CAD S/P urgent PCI 05/16/15 05/19/2015  . Cardiomyopathy, ischemic 05/19/2015  . ST elevation (STEMI) myocardial infarction involving left anterior descending coronary artery ([place])   . Acute ST elevation myocardial infarction (STEMI) of anterolateral wall ([place]) 05/16/2015  . Encounter for therapeutic drug monitoring 07/19/2013  . 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 Aortic valve replaced 01/25/2011  . Long term current use of anticoagulant therapy 06/25/2010  . INFECTIOUS DIARRHEA 03/20/2008  . DIARRHEA-PRESUMED INFECTIOUS 03/20/2008  . URTICARIA 03/13/2008  . FATIGUE 09/12/2007  . DIARRHEA 08/29/2007  . ABDOMINAL PAIN, GENERALIZED, CHRONIC 08/29/2007  . Allergic rhinitis 05/09/2007  . HIATAL HERNIA 05/09/2007  . ARTHRITIS 05/09/2007  . OSTEOPENIA 05/09/2007  . GERD 01/23/2007  . CANCER, ENDOMETRIUM 07/25/2006  . Dyslipidemia 07/25/2006  . COMMON MIGRAINE 07/25/2006  .  Essential hypertension 07/25/2006  . Hx Breast Cancer, IDC, Stage I 07/03/2003    Standley Brooking, PTA 05/01/2020, 12:32 PM  [facility] [address] [place], [place], [address] Phone: [phone]   Fax:  [phone]  Name: Amanda Wiggins MRN: [id number] Date of Birth: 28-Oct-1945

## 2020-05-04 ENCOUNTER — Ambulatory Visit: Payer: Medicare PPO | Admitting: Physical Therapy

## 2020-05-04 ENCOUNTER — Other Ambulatory Visit: Payer: Self-pay

## 2020-05-04 DIAGNOSIS — M6281 Muscle weakness (generalized): Secondary | ICD-10-CM

## 2020-05-04 DIAGNOSIS — R2681 Unsteadiness on feet: Secondary | ICD-10-CM | POA: Diagnosis not present

## 2020-05-04 DIAGNOSIS — R296 Repeated falls: Secondary | ICD-10-CM

## 2020-05-04 NOTE — Therapy (Signed)
[facility] [place], [place], [address] Phone: [phone]   Fax:  [phone]  Physical Therapy Treatment  Patient Details  Name: Amanda Wiggins MRN: [id number] Date of Birth: 1945-09-16 Referring Provider (PT): [name], MD   Encounter Date: 05/04/2020   PT End of Session - 05/04/20 1429    Visit Number 4    Number of Visits 12    Date for PT Re-Evaluation 06/11/20    Authorization Type [employer] Medicare (CQ & KX modifier) Progress note every 10th visit    PT Start Time 0225    PT Stop Time 0317    PT Time Calculation (min) 52 min    Activity Tolerance Patient tolerated treatment well    Behavior During Therapy [facility] for tasks assessed/performed           Past Medical History:  Diagnosis Date  . Aortic valve replaced   . Breast cancer ([place]) 2005  . FHx: migraine headaches   . GERD (gastroesophageal reflux disease)   . Hiatal hernia   . Hx Breast Cancer, IDC, Stage I 07/03/2003  . Hx of breast cancer   . Hyperlipidemia   . Hypertension   . Menopausal syndrome   . Osteopenia   . 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 Personal history of radiation therapy   . Status post aortic valve repair     Past Surgical History:  Procedure Laterality Date  . AORTIC VALVE REPLACEMENT  2003  . BREAST BIOPSY    . BREAST LUMPECTOMY Left 2005  . CARDIAC CATHETERIZATION     Ejection Fraction   . CARDIAC CATHETERIZATION N/A 05/16/2015   Procedure: Left Heart Cath and Coronary Angiography;  Surgeon: Leonie Man, MD;  Location: [place] [facility];  Service: Cardiovascular;  Laterality: N/A;  . CARDIAC CATHETERIZATION  05/16/2015   Procedure: Coronary Stent Intervention;  Surgeon: Leonie Man, MD;  Location: [place] [facility];  Service: Cardiovascular;;  . CARDIAC CATHETERIZATION N/A 05/19/2015   Procedure: Left Heart Cath and Coronary Angiography;  Surgeon: Troy Sine, MD;  Location: [place] [facility]  LAB;  Service: Cardiovascular;  Laterality: N/A;  . CARDIAC CATHETERIZATION N/A 05/19/2015   Procedure: Coronary Stent Intervention;  Surgeon: Troy Sine, MD;  Location: [place] [facility];  Service: Cardiovascular;  Laterality: N/A;  . HIATAL HERNIA REPAIR    . HIATAL HERNIA REPAIR  05/11/2007  . hysterectomy, endometiral cancer  2001  . lummpectomy breast cancer  07/03/2003  . nissan fundoplicaiton  [id number]   post-op hematoma on heparin   . right shouler surgery      There were no vitals filed for this visit.   Subjective Assessment - 05/04/20 1428    Subjective COVID-19 screening performed upon arrival. Patient reported doing well just would like to get better.    Pertinent History Latex allergy, fall risk, HTN, CHF, osteopenia, history of heart surgery    Limitations Walking;House hold activities    Patient Stated Goals improve balance and strength and return to exercise group programs.    Currently in Pain? No/denies                             [facility] Adult PT Treatment/Exercise - 05/04/20 0001      Mini-BESTest   Sit To Stand Normal: Comes to stand without use of hands and stabilizes independently.    Rise to Toes < 3 s.  Stand on one leg (left) Moderate: < 20 s    Stand on one leg (right) Severe: Unable    Stand on one leg - lowest score 0    Compensatory Stepping Correction - Forward Moderate: More than one step is required to recover equilibrium    Compensatory Stepping Correction - Backward Moderate: More than one step is required to recover equilibrium    Compensatory Stepping Correction - Left Lateral Normal: Recovers independently with 1 step (crossover or lateral OK)    Compensatory Stepping Correction - Right Lateral Normal: Recovers independently with 1 step (crossover or lateral OK)    Stepping Corredtion Lateral - lowest score 2    Stance - Feet together, eyes open, firm surface  Normal: 30s    Stance - Feet together, eyes closed, foam surface   Normal: 30s    Incline - Eyes Closed Severe: Unable    Change in Gait Speed Normal: Significantly changes walkling speed without imbalance    Walk with head turns - Horizontal Moderate: performs head turns with reduction in gait speed.    Walk with pivot turns Moderate:Turns with feet close SLOW (>4 steps) with good balance.    Step over obstacles Normal: Able to step over box with minimal change of gait speed and with good balance.    Timed UP & GO with Dual Task Moderate: Dual Task affects either counting OR walking (>10%) when compared to the TUG without Dual Task.    Mini-BEST total score 17      Lumbar Exercises: Aerobic   Nustep L4 x12 min      Knee/Hip Exercises: Standing   Heel Raises Both;20 reps   on airex   Heel Raises Limitations Toe raises x20   on airex   Hip Flexion Stengthening;Both;2 sets;10 reps;Knee bent    Hip Flexion Limitations 2#    Hip Abduction Stengthening;Both;2 sets;10 reps;Knee straight;Limitations    Abduction Limitations 2#    Hip Extension Stengthening;Both;2 sets;10 reps;Knee straight    Extension Limitations 2#    Forward Step Up Both;2 sets;10 reps;Hand Hold: 2;Step Height: 6"    Rocker Board 5 minutes      Knee/Hip Exercises: Seated   Long Arc Quad Strengthening;Both;2 sets;10 reps    Long Arc Quad Weight 3 lbs.    Marching Strengthening;Both;20 reps;Weights    Marching Weights 3 lbs.    Sit to Sand 2 sets;10 reps;without UE support      Knee/Hip Exercises: Supine   Bridges with Clamshell Strengthening;Both;20 reps   with red band                      PT Long Term Goals - 05/04/20 1430      PT LONG TERM GOAL #1   Title Patient will be independent with HEP    Time 6    Period Weeks    Status On-going      PT LONG TERM GOAL #2   Title Patient will demonstrate 4/5 or greater bilateral LE MMT to improve stability during functional tasks.    Time 6    Period Weeks    Status On-going      PT LONG TERM GOAL #3   Title  Patient will perform 5x sit to stand with no UE support in 10 seconds or less to improve functional LE strength.    Time 6    Period Weeks    Status Achieved      PT LONG TERM GOAL #4  Title Patient will decrease risk of falls as noted by a 56/56 score on the Berg Balance Scale    Time 6    Period Weeks    Status On-going                 Plan - 05/04/20 1459    Clinical Impression Statement Patient tolerated treatment well today. Patient able to progress bil LE activities today and doing well with balance activities and progression. Patient had no LOB today. Patient current goals ongoing due to weakness and blanace activities. BEST score complete see assessment.    Personal Factors and Comorbidities Comorbidity 3+;Age;Time since onset of injury/illness/exacerbation    Comorbidities Latex allergy, fall risk, HTN, CHF, osteopenia, history of heart surgery    Examination-Activity Limitations Locomotion Level    Examination-Participation Restrictions Yard Work    Stability/Clinical Decision Making Stable/Uncomplicated    Rehab Potential Excellent    PT Frequency 2x / week    PT Duration 6 weeks    PT Treatment/Interventions ADLs/Self Care Home Management;Gait training;Stair training;Functional mobility training;Therapeutic activities;Therapeutic exercise;Balance training;Neuromuscular re-education;Passive range of motion;Patient/family education    PT Next Visit Plan cont with POC for LE strength and balance exercises, PT to modify goals    Consulted and Agree with Plan of Care Patient           Patient will benefit from skilled therapeutic intervention in order to improve the following deficits and impairments:  Decreased activity tolerance,Decreased balance,Decreased strength,Decreased mobility,Postural dysfunction,Decreased safety awareness  Visit Diagnosis: Unsteadiness on feet  Muscle weakness (generalized)  Repeated falls     Problem List Patient Active Problem  List   Diagnosis Date Noted  . Acute on chronic combined systolic and diastolic CHF (congestive heart failure) ([place]) 11/07/2019  . Chronic systolic CHF (congestive heart failure) ([place]) 05/23/2015  . CAD S/P urgent PCI 05/16/15 05/19/2015  . Cardiomyopathy, ischemic 05/19/2015  . ST elevation (STEMI) myocardial infarction involving left anterior descending coronary artery ([place])   . Acute ST elevation myocardial infarction (STEMI) of anterolateral wall ([place]) 05/16/2015  . Encounter for therapeutic drug monitoring 07/19/2013  . 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 Aortic valve replaced 01/25/2011  . Long term current use of anticoagulant therapy 06/25/2010  . INFECTIOUS DIARRHEA 03/20/2008  . DIARRHEA-PRESUMED INFECTIOUS 03/20/2008  . URTICARIA 03/13/2008  . FATIGUE 09/12/2007  . DIARRHEA 08/29/2007  . ABDOMINAL PAIN, GENERALIZED, CHRONIC 08/29/2007  . Allergic rhinitis 05/09/2007  . HIATAL HERNIA 05/09/2007  . ARTHRITIS 05/09/2007  . OSTEOPENIA 05/09/2007  . GERD 01/23/2007  . CANCER, ENDOMETRIUM 07/25/2006  . Dyslipidemia 07/25/2006  . COMMON MIGRAINE 07/25/2006  . Essential hypertension 07/25/2006  . Hx Breast Cancer, IDC, Stage I 07/03/2003    DUNFORD, CHRISTINA P, PTA 05/04/2020, 3:26 PM  [facility] [place], [place], [address] Phone: [phone]   Fax:  [phone]  Name: Amanda Wiggins MRN: [id number] Date of Birth: 1945/09/14

## 2020-05-07 ENCOUNTER — Other Ambulatory Visit: Payer: Self-pay

## 2020-05-07 ENCOUNTER — Ambulatory Visit: Payer: Medicare PPO | Attending: Family Medicine | Admitting: Physical Therapy

## 2020-05-07 DIAGNOSIS — M6281 Muscle weakness (generalized): Secondary | ICD-10-CM | POA: Diagnosis not present

## 2020-05-07 DIAGNOSIS — R296 Repeated falls: Secondary | ICD-10-CM | POA: Diagnosis not present

## 2020-05-07 DIAGNOSIS — R2681 Unsteadiness on feet: Secondary | ICD-10-CM | POA: Insufficient documentation

## 2020-05-07 NOTE — Therapy (Signed)
[facility] [place], [place], [address] Phone: [phone]   Fax:  [phone]  Physical Therapy Treatment  Patient Details  Name: Amanda Wiggins MRN: [id number] Date of Birth: 31-Jul-1945 Referring Provider (PT): [name], MD   Encounter Date: 05/07/2020   PT End of Session - 05/07/20 1039    Visit Number 5    Number of Visits 12    Date for PT Re-Evaluation 06/11/20    Authorization Type [employer] (CQ & KX modifier) Progress note every 10th visit    PT Start Time 0945    PT Stop Time 1033    PT Time Calculation (min) 48 min    Activity Tolerance Patient tolerated treatment well    Behavior During Therapy [facility] for tasks assessed/performed           Past Medical History:  Diagnosis Date  . Aortic valve replaced   . Breast cancer ([place]) 2005  . FHx: migraine headaches   . GERD (gastroesophageal reflux disease)   . Hiatal hernia   . Hx Breast Cancer, IDC, Stage I 07/03/2003  . Hx of breast cancer   . Hyperlipidemia   . Hypertension   . Menopausal syndrome   . Osteopenia   . 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 Personal history of radiation therapy   . Status post aortic valve repair     Past Surgical History:  Procedure Laterality Date  . AORTIC VALVE REPLACEMENT  2003  . BREAST BIOPSY    . BREAST LUMPECTOMY Left 2005  . CARDIAC CATHETERIZATION     Ejection Fraction   . CARDIAC CATHETERIZATION N/A 05/16/2015   Procedure: Left Heart Cath and Coronary Angiography;  Surgeon: Leonie Man, MD;  Location: [place] CV [facility];  Service: Cardiovascular;  Laterality: N/A;  . CARDIAC CATHETERIZATION  05/16/2015   Procedure: Coronary Stent Intervention;  Surgeon: Leonie Man, MD;  Location: [place] CV [facility];  Service: Cardiovascular;;  . CARDIAC CATHETERIZATION N/A 05/19/2015   Procedure: Left Heart Cath and Coronary Angiography;  Surgeon: Troy Sine, MD;  Location: [place] CV  LAB;  Service: Cardiovascular;  Laterality: N/A;  . CARDIAC CATHETERIZATION N/A 05/19/2015   Procedure: Coronary Stent Intervention;  Surgeon: Troy Sine, MD;  Location: [place] CV LAB;  Service: Cardiovascular;  Laterality: N/A;  . HIATAL HERNIA REPAIR    . HIATAL HERNIA REPAIR  05/11/2007  . hysterectomy, endometiral cancer  2001  . lummpectomy breast cancer  07/03/2003  . nissan fundoplicaiton  [id number]   post-op hematoma on heparin   . right shouler surgery      There were no vitals filed for this visit.   Subjective Assessment - 05/07/20 1000    Subjective COVID-19 screening performed upon arrival. Patient arrived with just some minimal soreness otherwise doing well    Pertinent History Latex allergy, fall risk, HTN, CHF, osteopenia, history of heart surgery    Limitations Walking;House hold activities    Patient Stated Goals improve balance and strength and return to exercise group programs.    Currently in Pain? No/denies                             [facility] Adult PT Treatment/Exercise - 05/07/20 0001      Lumbar Exercises: Aerobic   Nustep L4 x12 min UE/LE      Knee/Hip Exercises: Standing   Hip Abduction Stengthening;Both;2 sets;10 reps;Knee straight;Limitations  Abduction Limitations 2#    Hip Extension Stengthening;Both;2 sets;10 reps;Knee straight    Extension Limitations 2#      Knee/Hip Exercises: Seated   Long Arc Quad Strengthening;Both;2 sets;10 reps    Long Arc Quad Weight 3 lbs.    Marching Strengthening;Both;20 reps;Weights    Marching Weights 3 lbs.               Balance Exercises - 05/07/20 0001      Balance Exercises: Standing   Standing Eyes Opened Wide (BOA)   balance on BOSU x23min   SLS with Vectors Intermittent upper extremity assist;Solid surface   Bil sides using balance pods forward/side back with more difficulty on left side   Rockerboard Anterior/posterior   83min   Other Standing Exercises resistance walking with  blue XTS all 4 directions x5reps each way                  PT Long Term Goals - 05/04/20 1430      PT LONG TERM GOAL #1   Title Patient will be independent with HEP    Time 6    Period Weeks    Status On-going      PT LONG TERM GOAL #2   Title Patient will demonstrate 4/5 or greater bilateral LE MMT to improve stability during functional tasks.    Time 6    Period Weeks    Status On-going      PT LONG TERM GOAL #3   Title Patient will perform 5x sit to stand with no UE support in 10 seconds or less to improve functional LE strength.    Time 6    Period Weeks    Status Achieved      PT LONG TERM GOAL #4   Title Patient will decrease risk of falls as noted by a 56/56 score on the Berg Balance Scale    Time 6    Period Weeks    Status On-going                 Plan - 05/07/20 1041    Clinical Impression Statement Patient tolerated treatment well today. Patient able to progress with balance activities today with increase difficulty with left side weakness with SLS and resisted walking. Patient doing well with HEP and able to perfrom ADL's withngreater ease. Patient current goals ongoing this week.    Personal Factors and Comorbidities Comorbidity 3+;Age;Time since onset of injury/illness/exacerbation    Comorbidities Latex allergy, fall risk, HTN, CHF, osteopenia, history of heart surgery    Examination-Activity Limitations Locomotion Level    Examination-Participation Restrictions Yard Work    Stability/Clinical Decision Making Stable/Uncomplicated    Rehab Potential Excellent    PT Frequency 2x / week    PT Duration 6 weeks    PT Treatment/Interventions ADLs/Self Care Home Management;Gait training;Stair training;Functional mobility training;Therapeutic activities;Therapeutic exercise;Balance training;Neuromuscular re-education;Passive range of motion;Patient/family education    PT Next Visit Plan cont with POC for LE strength and balance exercises, PT to modify  goals    Consulted and Agree with Plan of Care Patient           Patient will benefit from skilled therapeutic intervention in order to improve the following deficits and impairments:  Decreased activity tolerance,Decreased balance,Decreased strength,Decreased mobility,Postural dysfunction,Decreased safety awareness  Visit Diagnosis: Unsteadiness on feet  Muscle weakness (generalized)  Repeated falls     Problem List Patient Active Problem List   Diagnosis Date Noted  . Acute on  chronic combined systolic and diastolic CHF (congestive heart failure) ([place]) 11/07/2019  . Chronic systolic CHF (congestive heart failure) ([place]) 05/23/2015  . CAD S/P urgent PCI 05/16/15 05/19/2015  . Cardiomyopathy, ischemic 05/19/2015  . ST elevation (STEMI) myocardial infarction involving left anterior descending coronary artery ([place])   . Acute ST elevation myocardial infarction (STEMI) of anterolateral wall ([place]) 05/16/2015  . Encounter for therapeutic drug monitoring 07/19/2013  . 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 Aortic valve replaced 01/25/2011  . Long term current use of anticoagulant therapy 06/25/2010  . INFECTIOUS DIARRHEA 03/20/2008  . DIARRHEA-PRESUMED INFECTIOUS 03/20/2008  . URTICARIA 03/13/2008  . FATIGUE 09/12/2007  . DIARRHEA 08/29/2007  . ABDOMINAL PAIN, GENERALIZED, CHRONIC 08/29/2007  . Allergic rhinitis 05/09/2007  . HIATAL HERNIA 05/09/2007  . ARTHRITIS 05/09/2007  . OSTEOPENIA 05/09/2007  . GERD 01/23/2007  . CANCER, ENDOMETRIUM 07/25/2006  . Dyslipidemia 07/25/2006  . COMMON MIGRAINE 07/25/2006  . Essential hypertension 07/25/2006  . Hx Breast Cancer, IDC, Stage I 07/03/2003    Ladean Raya, PTA 05/07/20 10:55 AM   [facility] [place], [place], [address] Phone: [phone]   Fax:  [phone]  Name: Amanda Wiggins MRN: [id number] Date of Birth: 03-08-1945

## 2020-05-08 ENCOUNTER — Other Ambulatory Visit: Payer: Self-pay | Admitting: Cardiovascular Disease

## 2020-05-11 ENCOUNTER — Other Ambulatory Visit: Payer: Self-pay | Admitting: Hematology & Oncology

## 2020-05-11 ENCOUNTER — Other Ambulatory Visit: Payer: Self-pay

## 2020-05-11 ENCOUNTER — Encounter: Payer: Self-pay | Admitting: Physical Therapy

## 2020-05-11 ENCOUNTER — Ambulatory Visit: Payer: Medicare PPO | Admitting: Physical Therapy

## 2020-05-11 DIAGNOSIS — R2681 Unsteadiness on feet: Secondary | ICD-10-CM | POA: Diagnosis not present

## 2020-05-11 DIAGNOSIS — M6281 Muscle weakness (generalized): Secondary | ICD-10-CM

## 2020-05-11 DIAGNOSIS — R296 Repeated falls: Secondary | ICD-10-CM

## 2020-05-11 DIAGNOSIS — Z853 Personal history of malignant neoplasm of breast: Secondary | ICD-10-CM

## 2020-05-11 NOTE — Therapy (Signed)
[facility] [place], [place], [address] Phone: [phone]   Fax:  [phone]  Physical Therapy Treatment  Patient Details  Name: Amanda Wiggins MRN: [id number] Date of Birth: Feb 18, 1946 Referring Provider (PT): [name], MD   Encounter Date: 05/11/2020   PT End of Session - 05/11/20 0957    Visit Number 6    Number of Visits 12    Date for PT Re-Evaluation 06/11/20    Authorization Type [employer] Medicare (CQ & KX modifier) Progress note every 10th visit    PT Start Time 0948    PT Stop Time 1030    PT Time Calculation (min) 42 min    Activity Tolerance Patient tolerated treatment well    Behavior During Therapy [facility] for tasks assessed/performed           Past Medical History:  Diagnosis Date  . Aortic valve replaced   . Breast cancer ([place]) 2005  . FHx: migraine headaches   . GERD (gastroesophageal reflux disease)   . Hiatal hernia   . Hx Breast Cancer, IDC, Stage I 07/03/2003  . Hx of breast cancer   . Hyperlipidemia   . Hypertension   . Menopausal syndrome   . Osteopenia   . 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 Personal history of radiation therapy   . Status post aortic valve repair     Past Surgical History:  Procedure Laterality Date  . AORTIC VALVE REPLACEMENT  2003  . BREAST BIOPSY    . BREAST LUMPECTOMY Left 2005  . CARDIAC CATHETERIZATION     Ejection Fraction   . CARDIAC CATHETERIZATION N/A 05/16/2015   Procedure: Left Heart Cath and Coronary Angiography;  Surgeon: Leonie Man, MD;  Location: [place] CV LAB;  Service: Cardiovascular;  Laterality: N/A;  . CARDIAC CATHETERIZATION  05/16/2015   Procedure: Coronary Stent Intervention;  Surgeon: Leonie Man, MD;  Location: [place] CV LAB;  Service: Cardiovascular;;  . CARDIAC CATHETERIZATION N/A 05/19/2015   Procedure: Left Heart Cath and Coronary Angiography;  Surgeon: Troy Sine, MD;  Location: [place] CV  LAB;  Service: Cardiovascular;  Laterality: N/A;  . CARDIAC CATHETERIZATION N/A 05/19/2015   Procedure: Coronary Stent Intervention;  Surgeon: Troy Sine, MD;  Location: [place] CV LAB;  Service: Cardiovascular;  Laterality: N/A;  . HIATAL HERNIA REPAIR    . HIATAL HERNIA REPAIR  05/11/2007  . hysterectomy, endometiral cancer  2001  . lummpectomy breast cancer  07/03/2003  . nissan fundoplicaiton  [id number]   post-op hematoma on heparin   . right shouler surgery      There were no vitals filed for this visit.   Subjective Assessment - 05/11/20 0954    Subjective COVID-19 screening performed upon arrival. Patient arrived with just some minimal soreness otherwise doing well    Pertinent History Latex allergy, fall risk, HTN, CHF, osteopenia, history of heart surgery    Limitations Walking;House hold activities    Patient Stated Goals improve balance and strength and return to exercise group programs.    Currently in Pain? No/denies              [facility] PT Assessment - 05/11/20 0001      Assessment   Medical Diagnosis Balance problem    Referring Provider (PT) [name], MD    Next MD Visit 10/07/2020    Prior Therapy yes      Precautions   Precautions Fall  [place] Adult PT Treatment/Exercise - 05/11/20 0001      Knee/Hip Exercises: Aerobic   Nustep L4 x12 min      Knee/Hip Exercises: Machines for Strengthening   Cybex Knee Extension 10# 2x10 reps    Cybex Knee Flexion 30# 2x10 reps    Cybex Leg Press 2 pl, seat 5 x30 reps      Knee/Hip Exercises: Standing   Other Standing Knee Exercises Sidestepping in // bars x5 RT 3# anklweights               Balance Exercises - 05/11/20 0001      Balance Exercises: Standing   Standing Eyes Opened Narrow base of support (BOS);Foam/compliant surface;Time;Cognitive challenge    Standing Eyes Opened Time 4 min    SLS Eyes open;Foam/compliant surface;Intermittent upper extremity  support;Time;Cognitive challenge    SLS Time x3 min each    Heel Raises Both;20 reps    Toe Raise Both;20 reps   against wall                 PT Long Term Goals - 05/11/20 1823      PT LONG TERM GOAL #1   Title Patient will be independent with HEP    Time 6    Period Weeks    Status On-going      PT LONG TERM GOAL #2   Title Patient will demonstrate 4/5 or greater bilateral LE MMT to improve stability during functional tasks.    Time 6    Period Weeks    Status On-going      PT LONG TERM GOAL #3   Title Patient will perform 5x sit to stand with no UE support in 10 seconds or less to improve functional LE strength.    Time 6    Period Weeks    Status Achieved      PT LONG TERM GOAL #4   Title Patient will decrease risk of falls as noted by a 56/56 score on the Berg Balance Scale    Time 6    Period Weeks    Status On-going      PT LONG TERM GOAL #5   Title Patient will achieve 20/28 or greater on mini BESTest to decrease risk of falls.    Time 6    Period Weeks    Status New                 Plan - 05/11/20 1148    Clinical Impression Statement Patient eager to return to workouts at local gym and was very happy to get to machine strengthening while in clinic. Patient feels she leans more to left while working and greatest balance limitation is feeling unsteady while walking or turning corners. Greater L ankle weakness noted with SLS on airex. Cognitive challenge of holding conversation utilized throughout balance activites.    Personal Factors and Comorbidities Comorbidity 3+;Age;Time since onset of injury/illness/exacerbation    Comorbidities Latex allergy, fall risk, HTN, CHF, osteopenia, history of heart surgery    Examination-Activity Limitations Locomotion Level    Examination-Participation Restrictions Yard Work    Stability/Clinical Decision Making Stable/Uncomplicated    Rehab Potential Excellent    PT Frequency 2x / week    PT Duration 6 weeks     PT Treatment/Interventions ADLs/Self Care Home Management;Gait training;Stair training;Functional mobility training;Therapeutic activities;Therapeutic exercise;Balance training;Neuromuscular re-education;Passive range of motion;Patient/family education    PT Next Visit Plan cont with POC for LE strength and balance exercises, PT to  modify goals    PT Home Exercise Plan see patient education section    Consulted and Agree with Plan of Care Patient           Patient will benefit from skilled therapeutic intervention in order to improve the following deficits and impairments:  Decreased activity tolerance,Decreased balance,Decreased strength,Decreased mobility,Postural dysfunction,Decreased safety awareness  Visit Diagnosis: Unsteadiness on feet  Muscle weakness (generalized)  Repeated falls     Problem List Patient Active Problem List   Diagnosis Date Noted  . Acute on chronic combined systolic and diastolic CHF (congestive heart failure) ([place]) 11/07/2019  . Chronic systolic CHF (congestive heart failure) ([place]) 05/23/2015  . CAD S/P urgent PCI 05/16/15 05/19/2015  . Cardiomyopathy, ischemic 05/19/2015  . ST elevation (STEMI) myocardial infarction involving left anterior descending coronary artery ([place])   . Acute ST elevation myocardial infarction (STEMI) of anterolateral wall ([place]) 05/16/2015  . Encounter for therapeutic drug monitoring 07/19/2013  . 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 Aortic valve replaced 01/25/2011  . Long term current use of anticoagulant therapy 06/25/2010  . INFECTIOUS DIARRHEA 03/20/2008  . DIARRHEA-PRESUMED INFECTIOUS 03/20/2008  . URTICARIA 03/13/2008  . FATIGUE 09/12/2007  . DIARRHEA 08/29/2007  . ABDOMINAL PAIN, GENERALIZED, CHRONIC 08/29/2007  . Allergic rhinitis 05/09/2007  . HIATAL HERNIA 05/09/2007  . ARTHRITIS 05/09/2007  . OSTEOPENIA 05/09/2007  . GERD 01/23/2007  . CANCER, ENDOMETRIUM 07/25/2006  . Dyslipidemia  07/25/2006  . COMMON MIGRAINE 07/25/2006  . Essential hypertension 07/25/2006  . Hx Breast Cancer, IDC, Stage I 07/03/2003    Standley Brooking, PTA 05/11/2020, 6:25 PM  [facility] [address] [place], [place], [address] Phone: [phone]   Fax:  [phone]  Name: Amanda Wiggins MRN: [id number] Date of Birth: 1945/12/11

## 2020-05-14 ENCOUNTER — Other Ambulatory Visit: Payer: Self-pay

## 2020-05-14 ENCOUNTER — Encounter: Payer: Self-pay | Admitting: Physical Therapy

## 2020-05-14 ENCOUNTER — Ambulatory Visit: Payer: Medicare PPO | Admitting: Physical Therapy

## 2020-05-14 DIAGNOSIS — R296 Repeated falls: Secondary | ICD-10-CM

## 2020-05-14 DIAGNOSIS — R2681 Unsteadiness on feet: Secondary | ICD-10-CM | POA: Diagnosis not present

## 2020-05-14 DIAGNOSIS — M6281 Muscle weakness (generalized): Secondary | ICD-10-CM | POA: Diagnosis not present

## 2020-05-14 NOTE — Therapy (Signed)
[facility] [place], [place], [address] Phone: [phone]   Fax:  [phone]  Physical Therapy Treatment  Patient Details  Name: Amanda Wiggins MRN: [id number] Date of Birth: 09/11/1945 Referring Provider (PT): [name], MD   Encounter Date: 05/14/2020   PT End of Session - 05/14/20 1439    Visit Number 7    Number of Visits 12    Date for PT Re-Evaluation 06/11/20    Authorization Type [employer] Medicare (CQ & KX modifier) Progress note every 10th visit    PT Start Time 1430    PT Stop Time 1518    PT Time Calculation (min) 48 min    Activity Tolerance Patient tolerated treatment well    Behavior During Therapy [facility] for tasks assessed/performed           Past Medical History:  Diagnosis Date  . Aortic valve replaced   . Breast cancer ([place]) 2005  . FHx: migraine headaches   . GERD (gastroesophageal reflux disease)   . Hiatal hernia   . Hx Breast Cancer, IDC, Stage I 07/03/2003  . Hx of breast cancer   . Hyperlipidemia   . Hypertension   . Menopausal syndrome   . Osteopenia   . 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 Personal history of radiation therapy   . Status post aortic valve repair     Past Surgical History:  Procedure Laterality Date  . AORTIC VALVE REPLACEMENT  2003  . BREAST BIOPSY    . BREAST LUMPECTOMY Left 2005  . CARDIAC CATHETERIZATION     Ejection Fraction   . CARDIAC CATHETERIZATION N/A 05/16/2015   Procedure: Left Heart Cath and Coronary Angiography;  Surgeon: Leonie Man, MD;  Location: [place] CV LAB;  Service: Cardiovascular;  Laterality: N/A;  . CARDIAC CATHETERIZATION  05/16/2015   Procedure: Coronary Stent Intervention;  Surgeon: Leonie Man, MD;  Location: [place] CV LAB;  Service: Cardiovascular;;  . CARDIAC CATHETERIZATION N/A 05/19/2015   Procedure: Left Heart Cath and Coronary Angiography;  Surgeon: Troy Sine, MD;  Location: [place] CV  LAB;  Service: Cardiovascular;  Laterality: N/A;  . CARDIAC CATHETERIZATION N/A 05/19/2015   Procedure: Coronary Stent Intervention;  Surgeon: Troy Sine, MD;  Location: [place] CV LAB;  Service: Cardiovascular;  Laterality: N/A;  . HIATAL HERNIA REPAIR    . HIATAL HERNIA REPAIR  05/11/2007  . hysterectomy, endometiral cancer  2001  . lummpectomy breast cancer  07/03/2003  . nissan fundoplicaiton  [id number]   post-op hematoma on heparin   . right shouler surgery      There were no vitals filed for this visit.   Subjective Assessment - 05/14/20 1433    Subjective COVID-19 screening performed upon arrival. Patient arrives feeling better with improvements with no falls or missteps.    Pertinent History Latex allergy, fall risk, HTN, CHF, osteopenia, history of heart surgery    Limitations Walking;House hold activities    Patient Stated Goals improve balance and strength and return to exercise group programs.    Currently in Pain? No/denies              [facility] PT Assessment - 05/14/20 0001      Assessment   Medical Diagnosis Balance problem    Referring Provider (PT) [name], MD    Next MD Visit 10/07/2020    Prior Therapy yes      Precautions   Precautions Fall  [place] Adult PT Treatment/Exercise - 05/14/20 0001      Knee/Hip Exercises: Aerobic   Nustep L4 x12 min      Knee/Hip Exercises: Machines for Strengthening   Cybex Knee Extension 10# 2x10 reps    Cybex Knee Flexion 30# 2x10 reps    Cybex Leg Press 1 pl, seat 5 x30 reps; 1 plate heel raises H82               Balance Exercises - 05/14/20 0001      Balance Exercises: Standing   SLS Eyes open;Intermittent upper extremity support   3x1 min each; no shoe; static balance, around the body 1# weight; followed by 1# reaching SLS   Rockerboard Anterior/posterior;Lateral   x3 minutes each with intermittent UE suppport   Balance Beam lateral stepping with intermittent UE  support x2 minutes; tandem walking fwd and bwd with intermittent UE support x2 minutes                  PT Long Term Goals - 05/11/20 1823      PT LONG TERM GOAL #1   Title Patient will be independent with HEP    Time 6    Period Weeks    Status On-going      PT LONG TERM GOAL #2   Title Patient will demonstrate 4/5 or greater bilateral LE MMT to improve stability during functional tasks.    Time 6    Period Weeks    Status On-going      PT LONG TERM GOAL #3   Title Patient will perform 5x sit to stand with no UE support in 10 seconds or less to improve functional LE strength.    Time 6    Period Weeks    Status Achieved      PT LONG TERM GOAL #4   Title Patient will decrease risk of falls as noted by a 56/56 score on the Berg Balance Scale    Time 6    Period Weeks    Status On-going      PT LONG TERM GOAL #5   Title Patient will achieve 20/28 or greater on mini BESTest to decrease risk of falls.    Time 6    Period Weeks    Status New                 Plan - 05/14/20 1615    Clinical Impression Statement Patient responded well to therapy session with progression of SLS exercises with no shoe on. Patient required intermittent UE support but overall responded well with reports of fatigue, especially L LE. Strength machines performed with excellent technique though fatigued especially with heel raises.    Personal Factors and Comorbidities Comorbidity 3+;Age;Time since onset of injury/illness/exacerbation    Comorbidities Latex allergy, fall risk, HTN, CHF, osteopenia, history of heart surgery    Examination-Activity Limitations Locomotion Level    Examination-Participation Restrictions Yard Work    Stability/Clinical Decision Making Stable/Uncomplicated    Clinical Decision Making Low    Rehab Potential Excellent    PT Frequency 2x / week    PT Duration 6 weeks    PT Treatment/Interventions ADLs/Self Care Home Management;Gait training;Stair  training;Functional mobility training;Therapeutic activities;Therapeutic exercise;Balance training;Neuromuscular re-education;Passive range of motion;Patient/family education    PT Next Visit Plan cont with POC for LE strength and balance exercises    PT Home Exercise Plan see patient education section    Consulted and Agree with Plan of Care Patient  Patient will benefit from skilled therapeutic intervention in order to improve the following deficits and impairments:  Decreased activity tolerance,Decreased balance,Decreased strength,Decreased mobility,Postural dysfunction,Decreased safety awareness  Visit Diagnosis: Unsteadiness on feet  Muscle weakness (generalized)  Repeated falls     Problem List Patient Active Problem List   Diagnosis Date Noted  . Acute on chronic combined systolic and diastolic CHF (congestive heart failure) ([place]) 11/07/2019  . Chronic systolic CHF (congestive heart failure) ([place]) 05/23/2015  . CAD S/P urgent PCI 05/16/15 05/19/2015  . Cardiomyopathy, ischemic 05/19/2015  . ST elevation (STEMI) myocardial infarction involving left anterior descending coronary artery ([place])   . Acute ST elevation myocardial infarction (STEMI) of anterolateral wall ([place]) 05/16/2015  . Encounter for therapeutic drug monitoring 07/19/2013  . 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 Aortic valve replaced 01/25/2011  . Long term current use of anticoagulant therapy 06/25/2010  . INFECTIOUS DIARRHEA 03/20/2008  . DIARRHEA-PRESUMED INFECTIOUS 03/20/2008  . URTICARIA 03/13/2008  . FATIGUE 09/12/2007  . DIARRHEA 08/29/2007  . ABDOMINAL PAIN, GENERALIZED, CHRONIC 08/29/2007  . Allergic rhinitis 05/09/2007  . HIATAL HERNIA 05/09/2007  . ARTHRITIS 05/09/2007  . OSTEOPENIA 05/09/2007  . GERD 01/23/2007  . CANCER, ENDOMETRIUM 07/25/2006  . Dyslipidemia 07/25/2006  . COMMON MIGRAINE 07/25/2006  . Essential hypertension 07/25/2006  . Hx Breast Cancer, IDC,  Stage I 07/03/2003    Gabriela Eves 05/14/2020, 5:05 PM  [facility] [address] [place], [place], [address] Phone: [phone]   Fax:  [phone]  Name: Amanda Wiggins MRN: [id number] Date of Birth: June 07, 1945

## 2020-05-18 ENCOUNTER — Encounter: Payer: Self-pay | Admitting: Physical Therapy

## 2020-05-18 ENCOUNTER — Other Ambulatory Visit: Payer: Self-pay

## 2020-05-18 ENCOUNTER — Ambulatory Visit: Payer: Medicare PPO | Admitting: Physical Therapy

## 2020-05-18 DIAGNOSIS — R2681 Unsteadiness on feet: Secondary | ICD-10-CM | POA: Diagnosis not present

## 2020-05-18 DIAGNOSIS — R296 Repeated falls: Secondary | ICD-10-CM | POA: Diagnosis not present

## 2020-05-18 DIAGNOSIS — M6281 Muscle weakness (generalized): Secondary | ICD-10-CM

## 2020-05-18 DIAGNOSIS — M65312 Trigger thumb, left thumb: Secondary | ICD-10-CM | POA: Diagnosis not present

## 2020-05-18 NOTE — Therapy (Signed)
[facility] [place], [place], [address] Phone: [phone]   Fax:  [phone]  Physical Therapy Treatment  Patient Details  Name: Amanda Wiggins MRN: [id number] Date of Birth: Apr 23, 1945 Referring Provider (PT): [name], MD   Encounter Date: 05/18/2020   PT End of Session - 05/18/20 0955    Visit Number 8    Number of Visits 12    Date for PT Re-Evaluation 06/11/20    Authorization Type [employer] Medicare ([place] modifier) 04/23/2020-06/12/2020 12 approved visits;  Progress note every 10th visit     PT Start Time 0900    PT Stop Time 0946    PT Time Calculation (min) 46 min    Activity Tolerance Patient tolerated treatment well    Behavior During Therapy [facility] for tasks assessed/performed           Past Medical History:  Diagnosis Date  . Aortic valve replaced   . Breast cancer ([place]) 2005  . FHx: migraine headaches   . GERD (gastroesophageal reflux disease)   . Hiatal hernia   . Hx Breast Cancer, IDC, Stage I 07/03/2003  . Hx of breast cancer   . Hyperlipidemia   . Hypertension   . Menopausal syndrome   . Osteopenia   . 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 Personal history of radiation therapy   . Status post aortic valve repair     Past Surgical History:  Procedure Laterality Date  . AORTIC VALVE REPLACEMENT  2003  . BREAST BIOPSY    . BREAST LUMPECTOMY Left 2005  . CARDIAC CATHETERIZATION     Ejection Fraction   . CARDIAC CATHETERIZATION N/A 05/16/2015   Procedure: Left Heart Cath and Coronary Angiography;  Surgeon: Leonie Man, MD;  Location: [place] [facility];  Service: Cardiovascular;  Laterality: N/A;  . CARDIAC CATHETERIZATION  05/16/2015   Procedure: Coronary Stent Intervention;  Surgeon: Leonie Man, MD;  Location: [place] [facility];  Service: Cardiovascular;;  . CARDIAC CATHETERIZATION N/A 05/19/2015   Procedure: Left Heart Cath and Coronary Angiography;  Surgeon:  Troy Sine, MD;  Location: [place] [facility];  Service: Cardiovascular;  Laterality: N/A;  . CARDIAC CATHETERIZATION N/A 05/19/2015   Procedure: Coronary Stent Intervention;  Surgeon: Troy Sine, MD;  Location: [place] [facility];  Service: Cardiovascular;  Laterality: N/A;  . HIATAL HERNIA REPAIR    . HIATAL HERNIA REPAIR  05/11/2007  . hysterectomy, endometiral cancer  2001  . lummpectomy breast cancer  07/03/2003  . nissan fundoplicaiton  1324   post-op hematoma on heparin   . right shouler surgery      There were no vitals filed for this visit.   Subjective Assessment - 05/18/20 0921    Subjective COVID-19 screening performed upon arrival. Patient arrives doing fairly well no falls or missteps over the weekend.    Pertinent History Latex allergy, fall risk, HTN, CHF, osteopenia, history of heart surgery    Limitations Walking;House hold activities    Patient Stated Goals improve balance and strength and return to exercise group programs.    Currently in Pain? No/denies              [facility] PT Assessment - 05/18/20 0001      Assessment   Medical Diagnosis Balance problem    Referring Provider (PT) [name], MD    Next MD Visit 10/07/2020    Prior Therapy yes  Precautions   Precautions Fall                         [facility] Adult PT Treatment/Exercise - 05/18/20 0001      Knee/Hip Exercises: Aerobic   Nustep L4 x12 min      Knee/Hip Exercises: Machines for Strengthening   Cybex Knee Extension 10# 2x10 reps    Cybex Knee Flexion 30# 2x10 reps    Cybex Leg Press 1 pl, seat 5 x30 reps      Knee/Hip Exercises: Standing   Rocker Board 4 minutes               Balance Exercises - 05/18/20 0001      Balance Exercises: Standing   SLS with Vectors Solid surface;Other reps (comment)   x10 to toe tap to colored pods each   Gait with Head Turns Forward;Retro;5 reps;Cognitive challenge   head turns and pace changing   Step Over Hurdles / Cones  foward stepping over pods, small cones and large cone x6; lateral step over colored pod x10    Heel Raises Both;20 reps   on foam   Toe Raise Both;20 reps   on foam   Other Standing Exercises resistance walking with blue XTS all 4 directions x5reps each way                  PT Long Term Goals - 05/11/20 1823      PT LONG TERM GOAL #1   Title Patient will be independent with HEP    Time 6    Period Weeks    Status On-going      PT LONG TERM GOAL #2   Title Patient will demonstrate 4/5 or greater bilateral LE MMT to improve stability during functional tasks.    Time 6    Period Weeks    Status On-going      PT LONG TERM GOAL #3   Title Patient will perform 5x sit to stand with no UE support in 10 seconds or less to improve functional LE strength.    Time 6    Period Weeks    Status Achieved      PT LONG TERM GOAL #4   Title Patient will decrease risk of falls as noted by a 56/56 score on the Berg Balance Scale    Time 6    Period Weeks    Status On-going      PT LONG TERM GOAL #5   Title Patient will achieve 20/28 or greater on mini BESTest to decrease risk of falls.    Time 6    Period Weeks    Status New                 Plan - 05/18/20 0956    Clinical Impression Statement Patient was able to tolerate treatment well with progression of balance activities outsie of parallel bars. Patient provided with contact guard to maintain balance but patient was able to demonstrate use of the appropriate balance strategies. One loss of balance during resisted walking but recovered well with contact guard assist.    Personal Factors and Comorbidities Comorbidity 3+;Age;Time since onset of injury/illness/exacerbation    Comorbidities Latex allergy, fall risk, HTN, CHF, osteopenia, history of heart surgery    Examination-Activity Limitations Locomotion Level    Examination-Participation Restrictions Yard Work    Stability/Clinical Decision Making Stable/Uncomplicated     Designer, jewellery Low    Rehab  Potential Excellent    PT Frequency 2x / week    PT Duration 6 weeks    PT Treatment/Interventions ADLs/Self Care Home Management;Gait training;Stair training;Functional mobility training;Therapeutic activities;Therapeutic exercise;Balance training;Neuromuscular re-education;Passive range of motion;Patient/family education    PT Next Visit Plan cont with POC for LE strength and balance exercises    PT Home Exercise Plan see patient education section    Consulted and Agree with Plan of Care Patient           Patient will benefit from skilled therapeutic intervention in order to improve the following deficits and impairments:  Decreased activity tolerance,Decreased balance,Decreased strength,Decreased mobility,Postural dysfunction,Decreased safety awareness  Visit Diagnosis: Unsteadiness on feet  Muscle weakness (generalized)  Repeated falls     Problem List Patient Active Problem List   Diagnosis Date Noted  . Acute on chronic combined systolic and diastolic CHF (congestive heart failure) ([place]) 11/07/2019  . Chronic systolic CHF (congestive heart failure) ([place]) 05/23/2015  . CAD S/P urgent PCI 05/16/15 05/19/2015  . Cardiomyopathy, ischemic 05/19/2015  . ST elevation (STEMI) myocardial infarction involving left anterior descending coronary artery ([place])   . Acute ST elevation myocardial infarction (STEMI) of anterolateral wall ([place]) 05/16/2015  . Encounter for therapeutic drug monitoring 07/19/2013  . 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 Aortic valve replaced 01/25/2011  . Long term current use of anticoagulant therapy 06/25/2010  . INFECTIOUS DIARRHEA 03/20/2008  . DIARRHEA-PRESUMED INFECTIOUS 03/20/2008  . URTICARIA 03/13/2008  . FATIGUE 09/12/2007  . DIARRHEA 08/29/2007  . ABDOMINAL PAIN, GENERALIZED, CHRONIC 08/29/2007  . Allergic rhinitis 05/09/2007  . HIATAL HERNIA 05/09/2007  . ARTHRITIS 05/09/2007  . OSTEOPENIA  05/09/2007  . GERD 01/23/2007  . CANCER, ENDOMETRIUM 07/25/2006  . Dyslipidemia 07/25/2006  . COMMON MIGRAINE 07/25/2006  . Essential hypertension 07/25/2006  . Hx Breast Cancer, IDC, Stage I 07/03/2003    Gabriela Eves, PT, DPT 05/18/2020, 10:03 AM  [facility] [address] [place], [place], [address] Phone: [phone]   Fax:  [phone]  Name: MEILI KLECKLEY MRN: [id number] Date of Birth: [birth year]

## 2020-05-21 ENCOUNTER — Ambulatory Visit: Payer: Medicare PPO | Admitting: Physical Therapy

## 2020-05-21 ENCOUNTER — Other Ambulatory Visit: Payer: Self-pay

## 2020-05-21 DIAGNOSIS — M6281 Muscle weakness (generalized): Secondary | ICD-10-CM

## 2020-05-21 DIAGNOSIS — R2681 Unsteadiness on feet: Secondary | ICD-10-CM | POA: Diagnosis not present

## 2020-05-21 DIAGNOSIS — R296 Repeated falls: Secondary | ICD-10-CM | POA: Diagnosis not present

## 2020-05-21 NOTE — Therapy (Signed)
[facility] [place], [place], [address] Phone: [phone]   Fax:  [phone]  Physical Therapy Treatment  Patient Details  Name: Amanda Wiggins MRN: [id number] Date of Birth: [birth year] Referring Provider (PT): [name], MD   Encounter Date: 05/21/2020   PT End of Session - 05/21/20 1441    Visit Number 9    Number of Visits 12    Date for PT Re-Evaluation 06/11/20    Authorization Type [employer] Medicare ([place] modifier) 04/23/2020-06/12/2020 12 approved visits;  Progress note every 10th visit     PT Start Time 1345    PT Stop Time 1433    PT Time Calculation (min) 48 min    Activity Tolerance Patient tolerated treatment well    Behavior During Therapy [facility] for tasks assessed/performed           Past Medical History:  Diagnosis Date  . Aortic valve replaced   . Breast cancer ([place]) 2005  . FHx: migraine headaches   . GERD (gastroesophageal reflux disease)   . Hiatal hernia   . Hx Breast Cancer, IDC, Stage I 07/03/2003  . Hx of breast cancer   . Hyperlipidemia   . Hypertension   . Menopausal syndrome   . Osteopenia   . 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 Personal history of radiation therapy   . Status post aortic valve repair     Past Surgical History:  Procedure Laterality Date  . AORTIC VALVE REPLACEMENT  2003  . BREAST BIOPSY    . BREAST LUMPECTOMY Left 2005  . CARDIAC CATHETERIZATION     Ejection Fraction   . CARDIAC CATHETERIZATION N/A 05/16/2015   Procedure: Left Heart Cath and Coronary Angiography;  Surgeon: Leonie Man, MD;  Location: [place] [facility];  Service: Cardiovascular;  Laterality: N/A;  . CARDIAC CATHETERIZATION  05/16/2015   Procedure: Coronary Stent Intervention;  Surgeon: Leonie Man, MD;  Location: [place] [facility];  Service: Cardiovascular;;  . CARDIAC CATHETERIZATION N/A 05/19/2015   Procedure: Left Heart Cath and Coronary Angiography;  Surgeon:  Troy Sine, MD;  Location: [place] [facility];  Service: Cardiovascular;  Laterality: N/A;  . CARDIAC CATHETERIZATION N/A 05/19/2015   Procedure: Coronary Stent Intervention;  Surgeon: Troy Sine, MD;  Location: [place] [facility];  Service: Cardiovascular;  Laterality: N/A;  . HIATAL HERNIA REPAIR    . HIATAL HERNIA REPAIR  05/11/2007  . hysterectomy, endometiral cancer  2001  . lummpectomy breast cancer  07/03/2003  . nissan fundoplicaiton  0086   post-op hematoma on heparin   . right shouler surgery      There were no vitals filed for this visit.   Subjective Assessment - 05/21/20 1528    Subjective COVID-19 screening performed upon arrival. Patient arrives feeling fatigued after last session as well as after cleaning the other day.    Pertinent History Latex allergy, fall risk, HTN, CHF, osteopenia, history of heart surgery    Limitations Walking;House hold activities    Patient Stated Goals improve balance and strength and return to exercise group programs.    Currently in Pain? No/denies              [facility] PT Assessment - 05/21/20 0001      Assessment   Medical Diagnosis Balance problem    Referring Provider (PT) [name], MD    Next MD Visit 10/07/2020    Prior Therapy yes  [place] Adult PT Treatment/Exercise - 05/21/20 0001      Mini-BESTest   Sit To Stand Normal: Comes to stand without use of hands and stabilizes independently.    Rise to Toes Moderate: Heels up, but not full range (smaller than when holding hands), OR noticeable instability for 3 s.    Stand on one leg (left) Normal: 20 s.    Stand on one leg (right) Moderate: < 20 s    Stand on one leg - lowest score 1    Compensatory Stepping Correction - Forward Normal: Recovers independently with a single, large step (second realignement is allowed).    Compensatory Stepping Correction - Backward Moderate: More than one step is required to recover equilibrium     Compensatory Stepping Correction - Left Lateral Moderate: Several steps to recover equilibrium    Compensatory Stepping Correction - Right Lateral Moderate: Several steps to recover equilibrium    Stepping Corredtion Lateral - lowest score 1    Stance - Feet together, eyes open, firm surface  Normal: 30s    Stance - Feet together, eyes closed, foam surface  Moderate: < 30s    Incline - Eyes Closed Severe: Unable    Change in Gait Speed Normal: Significantly changes walkling speed without imbalance    Walk with head turns - Horizontal Moderate: performs head turns with reduction in gait speed.    Walk with pivot turns Normal: Turns with feet close FAST (< 3 steps) with good balance.    Step over obstacles Normal: Able to step over box with minimal change of gait speed and with good balance.    Timed UP & GO with Dual Task Normal: No noticeable change in sitting, standing or walking while backward counting when compared to TUG without    Mini-BEST total score 20      Lumbar Exercises: Standing   Other Standing Lumbar Exercises core press downs on counter top      Knee/Hip Exercises: Aerobic   Nustep L4 x12 min               Balance Exercises - 05/21/20 0001      Balance Exercises: Standing   Sidestepping 5 reps;3 reps   braiding steps   Marching Foam/compliant surface;Static;20 reps   with alternating UEs   Heel Raises Both;10 reps   on foam   Toe Raise Both;10 reps   onfoam                 PT Long Term Goals - 05/21/20 1532      PT LONG TERM GOAL #1   Title Patient will be independent with HEP    Time 6    Period Weeks    Status On-going      PT LONG TERM GOAL #2   Title Patient will demonstrate 4/5 or greater bilateral LE MMT to improve stability during functional tasks.    Time 6    Period Weeks    Status On-going      PT LONG TERM GOAL #3   Title Patient will perform 5x sit to stand with no UE support in 10 seconds or less to improve functional LE  strength.    Time 6    Period Weeks    Status Achieved      PT LONG TERM GOAL #4   Title Patient will decrease risk of falls as noted by a 56/56 score on the Berg Balance Scale    Time 6  Period Weeks    Status On-going      PT LONG TERM GOAL #5   Title Patient will achieve 20/28 or greater on mini BESTest to decrease risk of falls.    Time 6    Period Weeks    Status Achieved                 Plan - 05/21/20 1529    Clinical Impression Statement Patient was able to tolerate treatment well. Patient's mini Bestest performed with improvements in score, 20/28. Patient had difficulties with reactive postural control subcategory but overall did well with other categories. Patient required intermittent contact guard for stability. Patient's HEP progressed to improve standing balance activitie with which patient reported understanding.    Personal Factors and Comorbidities Comorbidity 3+;Age;Time since onset of injury/illness/exacerbation    Comorbidities Latex allergy, fall risk, HTN, CHF, osteopenia, history of heart surgery    Examination-Activity Limitations Locomotion Level    Examination-Participation Restrictions Yard Work    Stability/Clinical Decision Making Stable/Uncomplicated    Clinical Decision Making Low    Rehab Potential Excellent    PT Frequency 2x / week    PT Duration 6 weeks    PT Treatment/Interventions ADLs/Self Care Home Management;Gait training;Stair training;Functional mobility training;Therapeutic activities;Therapeutic exercise;Balance training;Neuromuscular re-education;Passive range of motion;Patient/family education    PT Next Visit Plan cont with POC for LE strength and balance exercises    PT Home Exercise Plan see patient education section    Consulted and Agree with Plan of Care Patient           Patient will benefit from skilled therapeutic intervention in order to improve the following deficits and impairments:  Decreased activity  tolerance,Decreased balance,Decreased strength,Decreased mobility,Postural dysfunction,Decreased safety awareness  Visit Diagnosis: Unsteadiness on feet  Muscle weakness (generalized)  Repeated falls     Problem List Patient Active Problem List   Diagnosis Date Noted  . Acute on chronic combined systolic and diastolic CHF (congestive heart failure) ([place]) 11/07/2019  . Chronic systolic CHF (congestive heart failure) ([place]) 05/23/2015  . CAD S/P urgent PCI 05/16/15 05/19/2015  . Cardiomyopathy, ischemic 05/19/2015  . ST elevation (STEMI) myocardial infarction involving left anterior descending coronary artery ([place])   . Acute ST elevation myocardial infarction (STEMI) of anterolateral wall ([place]) 05/16/2015  . Encounter for therapeutic drug monitoring 07/19/2013  . 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 Aortic valve replaced 01/25/2011  . Long term current use of anticoagulant therapy 06/25/2010  . INFECTIOUS DIARRHEA 03/20/2008  . DIARRHEA-PRESUMED INFECTIOUS 03/20/2008  . URTICARIA 03/13/2008  . FATIGUE 09/12/2007  . DIARRHEA 08/29/2007  . ABDOMINAL PAIN, GENERALIZED, CHRONIC 08/29/2007  . Allergic rhinitis 05/09/2007  . HIATAL HERNIA 05/09/2007  . ARTHRITIS 05/09/2007  . OSTEOPENIA 05/09/2007  . GERD 01/23/2007  . CANCER, ENDOMETRIUM 07/25/2006  . Dyslipidemia 07/25/2006  . COMMON MIGRAINE 07/25/2006  . Essential hypertension 07/25/2006  . Hx Breast Cancer, IDC, Stage I 07/03/2003    Gabriela Eves, PT, DPT 05/21/2020, 3:33 PM  [facility] [address] [place], [place], [address] Phone: [phone]   Fax:  [phone]  Name: Amanda Wiggins MRN: [id number] Date of Birth: [birth year]

## 2020-05-25 ENCOUNTER — Ambulatory Visit: Payer: Medicare PPO | Admitting: Physical Therapy

## 2020-05-25 ENCOUNTER — Encounter: Payer: Self-pay | Admitting: Physical Therapy

## 2020-05-25 ENCOUNTER — Other Ambulatory Visit: Payer: Self-pay

## 2020-05-25 DIAGNOSIS — R2681 Unsteadiness on feet: Secondary | ICD-10-CM | POA: Diagnosis not present

## 2020-05-25 DIAGNOSIS — R296 Repeated falls: Secondary | ICD-10-CM | POA: Diagnosis not present

## 2020-05-25 DIAGNOSIS — M6281 Muscle weakness (generalized): Secondary | ICD-10-CM

## 2020-05-25 NOTE — Therapy (Signed)
[facility] [place], [place], [address] Phone: [phone]   Fax:  [phone]  Physical Therapy Treatment  Progress Note Reporting Period 04/23/2020 to 05/25/2020  See note below for Objective Data and Assessment of Progress/Goals. Patient responded well to therapy with significant gains towards goals.      Patient Details  Name: Amanda Wiggins MRN: [id number] Date of Birth: 11-20-1945 Referring Provider (PT): [name], MD   Encounter Date: 05/25/2020   PT End of Session - 05/25/20 1311    Visit Number 10    Number of Visits 12    Date for PT Re-Evaluation 06/11/20    Authorization Type [employer] Medicare ([place] modifier) 04/23/2020-06/12/2020 12 approved visits;  Progress note every 10th visit     PT Start Time 1300    PT Stop Time 1345    PT Time Calculation (min) 45 min    Activity Tolerance Patient tolerated treatment well    Behavior During Therapy [facility] for tasks assessed/performed           Past Medical History:  Diagnosis Date  . Aortic valve replaced   . Breast cancer ([place]) 2005  . FHx: migraine headaches   . GERD (gastroesophageal reflux disease)   . Hiatal hernia   . Hx Breast Cancer, IDC, Stage I 07/03/2003  . Hx of breast cancer   . Hyperlipidemia   . Hypertension   . Menopausal syndrome   . Osteopenia   . 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 Personal history of radiation therapy   . Status post aortic valve repair     Past Surgical History:  Procedure Laterality Date  . AORTIC VALVE REPLACEMENT  2003  . BREAST BIOPSY    . BREAST LUMPECTOMY Left 2005  . CARDIAC CATHETERIZATION     Ejection Fraction   . CARDIAC CATHETERIZATION N/A 05/16/2015   Procedure: Left Heart Cath and Coronary Angiography;  Surgeon: Leonie Man, MD;  Location: [place] [facility];  Service: Cardiovascular;  Laterality: N/A;  . CARDIAC CATHETERIZATION  05/16/2015   Procedure: Coronary Stent  Intervention;  Surgeon: Leonie Man, MD;  Location: [place] [facility];  Service: Cardiovascular;;  . CARDIAC CATHETERIZATION N/A 05/19/2015   Procedure: Left Heart Cath and Coronary Angiography;  Surgeon: Troy Sine, MD;  Location: [place] [facility];  Service: Cardiovascular;  Laterality: N/A;  . CARDIAC CATHETERIZATION N/A 05/19/2015   Procedure: Coronary Stent Intervention;  Surgeon: Troy Sine, MD;  Location: [place] [facility];  Service: Cardiovascular;  Laterality: N/A;  . HIATAL HERNIA REPAIR    . HIATAL HERNIA REPAIR  05/11/2007  . hysterectomy, endometiral cancer  2001  . lummpectomy breast cancer  07/03/2003  . nissan fundoplicaiton  2423   post-op hematoma on heparin   . right shouler surgery      There were no vitals filed for this visit.   Subjective Assessment - 05/25/20 1521    Subjective COVID-19 screening performed upon arrival. Patient arrives doing well and has started turning her lights on at night when going to the bathroom to keep her balance.    Pertinent History Latex allergy, fall risk, HTN, CHF, osteopenia, history of heart surgery    Limitations Walking;House hold activities    Patient Stated Goals improve balance and strength and return to exercise group programs.    Currently in Pain? No/denies              [facility] PT  Assessment - 05/25/20 0001      Assessment   Medical Diagnosis Balance problem    Referring Provider (PT) [name], MD    Next MD Visit 10/07/2020    Prior Therapy yes      Berg Balance Test   Sit to Stand Able to stand without using hands and stabilize independently    Standing Unsupported Able to stand safely 2 minutes    Sitting with Back Unsupported but Feet Supported on Floor or Stool Able to sit safely and securely 2 minutes    Stand to Sit Sits safely with minimal use of hands    Transfers Able to transfer safely, minor use of hands    Standing Unsupported with Eyes Closed Able to stand 10 seconds safely     Standing Unsupported with Feet Together Able to place feet together independently and stand 1 minute safely    From Standing, Reach Forward with Outstretched Arm Can reach confidently >25 cm (10")    From Standing Position, Pick up Object from Floor Able to pick up shoe safely and easily    From Standing Position, Turn to Look Behind Over each Shoulder Looks behind from both sides and weight shifts well    Turn 360 Degrees Able to turn 360 degrees safely in 4 seconds or less    Standing Unsupported, Alternately Place Feet on Step/Stool Able to stand independently and safely and complete 8 steps in 20 seconds    Standing Unsupported, One Foot in Front Able to plae foot ahead of the other independently and hold 30 seconds    Standing on One Leg Able to lift leg independently and hold > 10 seconds    Total Score 55    Berg comment: low fall risk                         [facility] Adult PT Treatment/Exercise - 05/25/20 0001      Knee/Hip Exercises: Aerobic   Nustep L4 x12 min      Knee/Hip Exercises: Machines for Strengthening   Cybex Knee Extension 10# 3x10 reps    Cybex Knee Flexion 30# 3x10 reps    Cybex Leg Press 1.5 pl, seat 4 x30 reps      Knee/Hip Exercises: Standing   Rocker Board 4 minutes               Balance Exercises - 05/25/20 0001      Balance Exercises: Standing   Standing Eyes Opened Narrow base of support (BOS);Foam/compliant surface;Other reps (comment)   2x10 with ball toss   Balance Beam lateral stepping x5; tandem walking forward x5                  PT Long Term Goals - 05/25/20 1312      PT LONG TERM GOAL #1   Title Patient will be independent with HEP    Time 6    Period Weeks    Status Achieved      PT LONG TERM GOAL #2   Title Patient will demonstrate 4/5 or greater bilateral LE MMT to improve stability during functional tasks.    Time 6    Period Weeks    Status On-going      PT LONG TERM GOAL #3   Title Patient will  perform 5x sit to stand with no UE support in 10 seconds or less to improve functional LE strength.    Time 6  Period Weeks    Status Achieved      PT LONG TERM GOAL #4   Title Patient will decrease risk of falls as noted by a 56/56 score on the Berg Balance Scale    Time 6    Period Weeks    Status On-going      PT LONG TERM GOAL #5   Title Patient will achieve 20/28 or greater on mini BESTest to decrease risk of falls.    Time 6    Period Weeks    Status Achieved                 Plan - 05/25/20 1434    Clinical Impression Statement Patient arrives doing well with reports of compliance with HEP. Patient's Berg improved to 55/56 with limitation attaining full tandem stance. Patient with excellent stepping strategies with balance beam lateral stepping and tandem walking to maintain balance. Goals are ongoing at this time but is responding very well to therapy sessions.    Personal Factors and Comorbidities Comorbidity 3+;Age;Time since onset of injury/illness/exacerbation    Comorbidities Latex allergy, fall risk, HTN, CHF, osteopenia, history of heart surgery    Examination-Activity Limitations Locomotion Level    Examination-Participation Restrictions Yard Work    Stability/Clinical Decision Making Stable/Uncomplicated    Clinical Decision Making Low    Rehab Potential Excellent    PT Frequency 2x / week    PT Duration 6 weeks    PT Treatment/Interventions ADLs/Self Care Home Management;Gait training;Stair training;Functional mobility training;Therapeutic activities;Therapeutic exercise;Balance training;Neuromuscular re-education;Passive range of motion;Patient/family education    PT Next Visit Plan cont with POC for LE strength and balance exercises    PT Home Exercise Plan see patient education section    Consulted and Agree with Plan of Care Patient           Patient will benefit from skilled therapeutic intervention in order to improve the following deficits and  impairments:  Decreased activity tolerance,Decreased balance,Decreased strength,Decreased mobility,Postural dysfunction,Decreased safety awareness  Visit Diagnosis: Unsteadiness on feet  Muscle weakness (generalized)  Repeated falls     Problem List Patient Active Problem List   Diagnosis Date Noted  . Acute on chronic combined systolic and diastolic CHF (congestive heart failure) ([place]) 11/07/2019  . Chronic systolic CHF (congestive heart failure) ([place]) 05/23/2015  . CAD S/P urgent PCI 05/16/15 05/19/2015  . Cardiomyopathy, ischemic 05/19/2015  . ST elevation (STEMI) myocardial infarction involving left anterior descending coronary artery ([place])   . Acute ST elevation myocardial infarction (STEMI) of anterolateral wall ([place]) 05/16/2015  . Encounter for therapeutic drug monitoring 07/19/2013  . 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 Aortic valve replaced 01/25/2011  . Long term current use of anticoagulant therapy 06/25/2010  . INFECTIOUS DIARRHEA 03/20/2008  . DIARRHEA-PRESUMED INFECTIOUS 03/20/2008  . URTICARIA 03/13/2008  . FATIGUE 09/12/2007  . DIARRHEA 08/29/2007  . ABDOMINAL PAIN, GENERALIZED, CHRONIC 08/29/2007  . Allergic rhinitis 05/09/2007  . HIATAL HERNIA 05/09/2007  . ARTHRITIS 05/09/2007  . OSTEOPENIA 05/09/2007  . GERD 01/23/2007  . CANCER, ENDOMETRIUM 07/25/2006  . Dyslipidemia 07/25/2006  . COMMON MIGRAINE 07/25/2006  . Essential hypertension 07/25/2006  . Hx Breast Cancer, IDC, Stage I 07/03/2003    Gabriela Eves, PT, DPT 05/25/2020, 3:22 PM  [facility] [address] [place], [place], [address] Phone: [phone]   Fax:  [phone]  Name: Amanda Wiggins MRN: [id number] Date of Birth: October 16, 1945

## 2020-05-26 ENCOUNTER — Ambulatory Visit (INDEPENDENT_AMBULATORY_CARE_PROVIDER_SITE_OTHER): Payer: Medicare PPO | Admitting: *Deleted

## 2020-05-26 DIAGNOSIS — Z5181 Encounter for therapeutic drug level monitoring: Secondary | ICD-10-CM | POA: Diagnosis not present

## 2020-05-26 DIAGNOSIS — Z952 Presence of prosthetic heart valve: Secondary | ICD-10-CM

## 2020-05-26 LAB — POCT INR: INR: 2.7 (ref 2.0–3.0)

## 2020-05-26 NOTE — Patient Instructions (Signed)
Description   Continue taking Warfarin 1/2 tablet daily except 1 tablet on Tuesdays, Thursdays, and Saturdays. Recheck in 6 weeks. Coumadin Clinic [phone]

## 2020-05-28 ENCOUNTER — Other Ambulatory Visit: Payer: Self-pay

## 2020-05-28 ENCOUNTER — Encounter: Payer: Self-pay | Admitting: Physical Therapy

## 2020-05-28 ENCOUNTER — Ambulatory Visit: Payer: Medicare PPO | Admitting: Physical Therapy

## 2020-05-28 DIAGNOSIS — R296 Repeated falls: Secondary | ICD-10-CM | POA: Diagnosis not present

## 2020-05-28 DIAGNOSIS — M6281 Muscle weakness (generalized): Secondary | ICD-10-CM | POA: Diagnosis not present

## 2020-05-28 DIAGNOSIS — R2681 Unsteadiness on feet: Secondary | ICD-10-CM

## 2020-05-28 NOTE — Therapy (Signed)
[facility] [place], [place], [address] Phone: [phone]   Fax:  [phone]  Physical Therapy Treatment  Patient Details  Name: Amanda Wiggins MRN: [id number] Date of Birth: Feb 11, 1946 Referring Provider (PT): [name], MD   Encounter Date: 05/28/2020   PT End of Session - 05/28/20 1430    Visit Number 11    Number of Visits 12    Date for PT Re-Evaluation 06/11/20    Authorization Type [employer] Medicare ([place] modifier) 04/23/2020-06/12/2020 12 approved visits;  Progress note every 10th visit     PT Start Time 1429    PT Stop Time 1515    PT Time Calculation (min) 46 min    Activity Tolerance Patient tolerated treatment well    Behavior During Therapy [facility] for tasks assessed/performed           Past Medical History:  Diagnosis Date  . Aortic valve replaced   . Breast cancer ([place]) 2005  . FHx: migraine headaches   . GERD (gastroesophageal reflux disease)   . Hiatal hernia   . Hx Breast Cancer, IDC, Stage I 07/03/2003  . Hx of breast cancer   . Hyperlipidemia   . Hypertension   . Menopausal syndrome   . Osteopenia   . 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 Personal history of radiation therapy   . Status post aortic valve repair     Past Surgical History:  Procedure Laterality Date  . AORTIC VALVE REPLACEMENT  2003  . BREAST BIOPSY    . BREAST LUMPECTOMY Left 2005  . CARDIAC CATHETERIZATION     Ejection Fraction   . CARDIAC CATHETERIZATION N/A 05/16/2015   Procedure: Left Heart Cath and Coronary Angiography;  Surgeon: Leonie Man, MD;  Location: [place] [facility];  Service: Cardiovascular;  Laterality: N/A;  . CARDIAC CATHETERIZATION  05/16/2015   Procedure: Coronary Stent Intervention;  Surgeon: Leonie Man, MD;  Location: [place] [facility];  Service: Cardiovascular;;  . CARDIAC CATHETERIZATION N/A 05/19/2015   Procedure: Left Heart Cath and Coronary Angiography;  Surgeon:  Troy Sine, MD;  Location: [place] [facility];  Service: Cardiovascular;  Laterality: N/A;  . CARDIAC CATHETERIZATION N/A 05/19/2015   Procedure: Coronary Stent Intervention;  Surgeon: Troy Sine, MD;  Location: [place] [facility];  Service: Cardiovascular;  Laterality: N/A;  . HIATAL HERNIA REPAIR    . HIATAL HERNIA REPAIR  05/11/2007  . hysterectomy, endometiral cancer  2001  . lummpectomy breast cancer  07/03/2003  . nissan fundoplicaiton  6503   post-op hematoma on heparin   . right shouler surgery      There were no vitals filed for this visit.   Subjective Assessment - 05/28/20 1429    Subjective COVID-19 screening performed upon arrival. No new complaints.    Pertinent History Latex allergy, fall risk, HTN, CHF, osteopenia, history of heart surgery    Limitations Walking;House hold activities    Patient Stated Goals improve balance and strength and return to exercise group programs.    Currently in Pain? No/denies              [facility] PT Assessment - 05/28/20 0001      Assessment   Medical Diagnosis Balance problem    Referring Provider (PT) [name], MD    Next MD Visit 10/07/2020    Prior Therapy yes      Precautions   Precautions Fall  Restrictions   Weight Bearing Restrictions No                         [facility] Adult PT Treatment/Exercise - 05/28/20 0001      Knee/Hip Exercises: Aerobic   Nustep L4 x12 min      Knee/Hip Exercises: Machines for Strengthening   Cybex Knee Extension 10# 3x10 reps    Cybex Knee Flexion 30# 3x10 reps    Cybex Leg Press 1.5 pl, seat 4 x30 reps      Knee/Hip Exercises: Standing   Heel Raises Both;3 sets;10 reps;Limitations   2D   Heel Raises Limitations B toe raise x30 reps               Balance Exercises - 05/28/20 0001      Balance Exercises: Standing   SLS Eyes open;Limitations    SLS Time 2x fatigue for each LE    SLS Limitations ball toss    Tandem Gait Forward;Retro;5 reps     Sidestepping Foam/compliant support;5 reps    Other Standing Exercises SLS balance pod touch x3 RT    Other Standing Exercises Comments DLS BOSU intermittant UE support                  PT Long Term Goals - 05/25/20 1312      PT LONG TERM GOAL #1   Title Patient will be independent with HEP    Time 6    Period Weeks    Status Achieved      PT LONG TERM GOAL #2   Title Patient will demonstrate 4/5 or greater bilateral LE MMT to improve stability during functional tasks.    Time 6    Period Weeks    Status On-going      PT LONG TERM GOAL #3   Title Patient will perform 5x sit to stand with no UE support in 10 seconds or less to improve functional LE strength.    Time 6    Period Weeks    Status Achieved      PT LONG TERM GOAL #4   Title Patient will decrease risk of falls as noted by a 56/56 score on the Berg Balance Scale    Time 6    Period Weeks    Status On-going      PT LONG TERM GOAL #5   Title Patient will achieve 20/28 or greater on mini BESTest to decrease risk of falls.    Time 6    Period Weeks    Status Achieved                 Plan - 05/28/20 1539    Clinical Impression Statement Patient presented in clinic with reports of L ankle weakness from past fracture during SLS. Patient also demonstrated LE fatigue with SLS activities. Appropriate ankle strategy noted during balance activities with main focus on beam exercises of not staring at her feet.    Personal Factors and Comorbidities Comorbidity 3+;Age;Time since onset of injury/illness/exacerbation    Comorbidities Latex allergy, fall risk, HTN, CHF, osteopenia, history of heart surgery    Examination-Activity Limitations Locomotion Level    Examination-Participation Restrictions Yard Work    Stability/Clinical Decision Making Stable/Uncomplicated    Rehab Potential Excellent    PT Frequency 2x / week    PT Duration 6 weeks    PT Treatment/Interventions ADLs/Self Care Home Management;Gait  training;Stair training;Functional mobility training;Therapeutic activities;Therapeutic exercise;Balance training;Neuromuscular re-education;Passive  range of motion;Patient/family education    PT Next Visit Plan cont with POC for LE strength and balance exercises    PT Home Exercise Plan see patient education section    Consulted and Agree with Plan of Care Patient           Patient will benefit from skilled therapeutic intervention in order to improve the following deficits and impairments:  Decreased activity tolerance,Decreased balance,Decreased strength,Decreased mobility,Postural dysfunction,Decreased safety awareness  Visit Diagnosis: Unsteadiness on feet  Muscle weakness (generalized)  Repeated falls     Problem List Patient Active Problem List   Diagnosis Date Noted  . Acute on chronic combined systolic and diastolic CHF (congestive heart failure) ([place]) 11/07/2019  . Chronic systolic CHF (congestive heart failure) ([place]) 05/23/2015  . CAD S/P urgent PCI 05/16/15 05/19/2015  . Cardiomyopathy, ischemic 05/19/2015  . ST elevation (STEMI) myocardial infarction involving left anterior descending coronary artery ([place])   . Acute ST elevation myocardial infarction (STEMI) of anterolateral wall ([place]) 05/16/2015  . Encounter for therapeutic drug monitoring 07/19/2013  . 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 Aortic valve replaced 01/25/2011  . Long term current use of anticoagulant therapy 06/25/2010  . INFECTIOUS DIARRHEA 03/20/2008  . DIARRHEA-PRESUMED INFECTIOUS 03/20/2008  . URTICARIA 03/13/2008  . FATIGUE 09/12/2007  . DIARRHEA 08/29/2007  . ABDOMINAL PAIN, GENERALIZED, CHRONIC 08/29/2007  . Allergic rhinitis 05/09/2007  . HIATAL HERNIA 05/09/2007  . ARTHRITIS 05/09/2007  . OSTEOPENIA 05/09/2007  . GERD 01/23/2007  . CANCER, ENDOMETRIUM 07/25/2006  . Dyslipidemia 07/25/2006  . COMMON MIGRAINE 07/25/2006  . Essential hypertension 07/25/2006  . Hx Breast  Cancer, IDC, Stage I 07/03/2003    Standley Brooking, PTA 05/28/2020, 3:42 PM  [facility] [address] [place], [place], [address] Phone: [phone]   Fax:  [phone]  Name: Amanda Wiggins MRN: [id number] Date of Birth: 1945/08/27

## 2020-06-02 ENCOUNTER — Encounter: Payer: Self-pay | Admitting: Physical Therapy

## 2020-06-02 ENCOUNTER — Other Ambulatory Visit: Payer: Self-pay

## 2020-06-02 ENCOUNTER — Ambulatory Visit: Payer: Medicare PPO | Admitting: Physical Therapy

## 2020-06-02 DIAGNOSIS — R296 Repeated falls: Secondary | ICD-10-CM

## 2020-06-02 DIAGNOSIS — M6281 Muscle weakness (generalized): Secondary | ICD-10-CM | POA: Diagnosis not present

## 2020-06-02 DIAGNOSIS — R2681 Unsteadiness on feet: Secondary | ICD-10-CM

## 2020-06-02 NOTE — Therapy (Signed)
[facility] [place], [place], [address] Phone: [phone]   Fax:  [phone]  Physical Therapy Treatment  PHYSICAL THERAPY DISCHARGE SUMMARY  Visits from Start of Care: 12  Current functional level related to goals / functional outcomes: See below   Remaining deficits: See goals   Education / Equipment: HEP Plan: Patient agrees to discharge.  Patient goals were met. Patient is being discharged due to meeting the stated rehab goals.  ?????     Patient Details  Name: Amanda Wiggins MRN: [id number] Date of Birth: 14-Feb-1946 Referring Provider (PT): [name], MD   Encounter Date: 06/02/2020   PT End of Session - 06/02/20 1121    Visit Number 12    Number of Visits 12    Date for PT Re-Evaluation 06/11/20    Authorization Type [employer] Medicare ([place] modifier) 04/23/2020-06/12/2020 12 approved visits;  Progress note every 10th visit     PT Start Time 1116    PT Stop Time 1156    PT Time Calculation (min) 40 min    Activity Tolerance Patient tolerated treatment well    Behavior During Therapy [facility] for tasks assessed/performed           Past Medical History:  Diagnosis Date  . Aortic valve replaced   . Breast cancer ([place]) 2005  . FHx: migraine headaches   . GERD (gastroesophageal reflux disease)   . Hiatal hernia   . Hx Breast Cancer, IDC, Stage I 07/03/2003  . Hx of breast cancer   . Hyperlipidemia   . Hypertension   . Menopausal syndrome   . Osteopenia   . 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 Personal history of radiation therapy   . Status post aortic valve repair     Past Surgical History:  Procedure Laterality Date  . AORTIC VALVE REPLACEMENT  2003  . BREAST BIOPSY    . BREAST LUMPECTOMY Left 2005  . CARDIAC CATHETERIZATION     Ejection Fraction   . CARDIAC CATHETERIZATION N/A 05/16/2015   Procedure: Left Heart Cath and Coronary Angiography;  Surgeon: Leonie Man, MD;   Location: [place] [facility];  Service: Cardiovascular;  Laterality: N/A;  . CARDIAC CATHETERIZATION  05/16/2015   Procedure: Coronary Stent Intervention;  Surgeon: Leonie Man, MD;  Location: [place] [facility];  Service: Cardiovascular;;  . CARDIAC CATHETERIZATION N/A 05/19/2015   Procedure: Left Heart Cath and Coronary Angiography;  Surgeon: Troy Sine, MD;  Location: [place] [facility];  Service: Cardiovascular;  Laterality: N/A;  . CARDIAC CATHETERIZATION N/A 05/19/2015   Procedure: Coronary Stent Intervention;  Surgeon: Troy Sine, MD;  Location: [place] [facility];  Service: Cardiovascular;  Laterality: N/A;  . HIATAL HERNIA REPAIR    . HIATAL HERNIA REPAIR  05/11/2007  . hysterectomy, endometiral cancer  2001  . lummpectomy breast cancer  07/03/2003  . nissan fundoplicaiton  [id number]   post-op hematoma on heparin   . right shouler surgery      There were no vitals filed for this visit.   Subjective Assessment - 06/02/20 1121    Subjective COVID-19 screening performed upon arrival. No new complaints.    Pertinent History Latex allergy, fall risk, HTN, CHF, osteopenia, history of heart surgery    Limitations Walking;House hold activities    Patient Stated Goals improve balance and strength and return to exercise group programs.    Currently in Pain? No/denies  [facility] PT Assessment - 06/02/20 0001      Assessment   Medical Diagnosis Balance problem    Referring Provider (PT) [name], MD    Next MD Visit 10/07/2020    Prior Therapy yes      Precautions   Precautions Fall      Restrictions   Weight Bearing Restrictions No      ROM / Strength   AROM / PROM / Strength Strength      Strength   Strength Assessment Site Hip;Knee    Right/Left Hip Right;Left    Right Hip Flexion 4+/5    Left Hip Flexion 4/5    Right/Left Knee Right;Left    Right Knee Flexion 4+/5    Right Knee Extension 4+/5    Left Knee Flexion 4/5    Left Knee Extension 4+/5                          [facility] Adult PT Treatment/Exercise - 06/02/20 0001      Standardized Balance Assessment   Standardized Balance Assessment Berg Balance Test      Berg Balance Test   Sit to Stand Able to stand without using hands and stabilize independently    Standing Unsupported Able to stand safely 2 minutes    Sitting with Back Unsupported but Feet Supported on Floor or Stool Able to sit safely and securely 2 minutes    Stand to Sit Sits safely with minimal use of hands    Transfers Able to transfer safely, minor use of hands    Standing Unsupported with Eyes Closed Able to stand 10 seconds safely    Standing Ubsupported with Feet Together Able to place feet together independently and stand 1 minute safely    From Standing, Reach Forward with Outstretched Arm Can reach confidently >25 cm (10")    From Standing Position, Pick up Object from Floor Able to pick up shoe safely and easily    From Standing Position, Turn to Look Behind Over each Shoulder Looks behind from both sides and weight shifts well    Turn 360 Degrees Able to turn 360 degrees safely in 4 seconds or less    Standing Unsupported, Alternately Place Feet on Step/Stool Able to stand independently and safely and complete 8 steps in 20 seconds    Standing Unsupported, One Foot in Front Able to place foot tandem independently and hold 30 seconds    Standing on One Leg Able to lift leg independently and hold > 10 seconds   BLE hold 30 sec   Total Score 56      Knee/Hip Exercises: Aerobic   Nustep L4 x12 min      Knee/Hip Exercises: Machines for Strengthening   Cybex Knee Extension 20# 3x10 reps    Cybex Knee Flexion 30# 3x10 reps    Cybex Leg Press 1.5 pl, seat 5 x40 reps                       PT Long Term Goals - 06/02/20 1131      PT LONG TERM GOAL #1   Title Patient will be independent with HEP    Time 6    Period Weeks    Status Achieved      PT LONG TERM GOAL #2   Title  Patient will demonstrate 4/5 or greater bilateral LE MMT to improve stability during functional tasks.    Time 6  Period Weeks    Status Achieved      PT LONG TERM GOAL #3   Title Patient will perform 5x sit to stand with no UE support in 10 seconds or less to improve functional LE strength.    Time 6    Period Weeks    Status Achieved      PT LONG TERM GOAL #4   Title Patient will decrease risk of falls as noted by a 56/56 score on the Berg Balance Scale    Time 6    Period Weeks    Status Achieved      PT LONG TERM GOAL #5   Title Patient will achieve 20/28 or greater on mini BESTest to decrease risk of falls.    Time 6    Period Weeks    Status Achieved                 Plan - 06/02/20 1213    Clinical Impression Statement Patient presented in clinic with no new complaints in regards to function within her home or community. Patient compliant with HEP and any simple balance activities such as SLS. Patient reports greater weakness of LLE but able to complete SLS on both LEs for 30 sec. Patient reports that she is to return to local gym/rec department after meeting all LTGs. Patient reports fatigue after activity due to cardiac output but knows the importance of returning to activity.    Personal Factors and Comorbidities Comorbidity 3+;Age;Time since onset of injury/illness/exacerbation    Comorbidities Latex allergy, fall risk, HTN, CHF, osteopenia, history of heart surgery    Examination-Activity Limitations Locomotion Level    Examination-Participation Restrictions Yard Work    Stability/Clinical Decision Making Stable/Uncomplicated    Rehab Potential Excellent    PT Frequency 2x / week    PT Duration 6 weeks    PT Treatment/Interventions ADLs/Self Care Home Management;Gait training;Stair training;Functional mobility training;Therapeutic activities;Therapeutic exercise;Balance training;Neuromuscular re-education;Passive range of motion;Patient/family education    PT  Next Visit Plan cont with POC for LE strength and balance exercises    PT Home Exercise Plan see patient education section    Consulted and Agree with Plan of Care Patient           Patient will benefit from skilled therapeutic intervention in order to improve the following deficits and impairments:  Decreased activity tolerance,Decreased balance,Decreased strength,Decreased mobility,Postural dysfunction,Decreased safety awareness  Visit Diagnosis: Unsteadiness on feet  Muscle weakness (generalized)  Repeated falls     Problem List Patient Active Problem List   Diagnosis Date Noted  . Acute on chronic combined systolic and diastolic CHF (congestive heart failure) ([place]) 11/07/2019  . Chronic systolic CHF (congestive heart failure) ([place]) 05/23/2015  . CAD S/P urgent PCI 05/16/15 05/19/2015  . Cardiomyopathy, ischemic 05/19/2015  . ST elevation (STEMI) myocardial infarction involving left anterior descending coronary artery ([place])   . Acute ST elevation myocardial infarction (STEMI) of anterolateral wall ([place]) 05/16/2015  . Encounter for therapeutic drug monitoring 07/19/2013  . 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 Aortic valve replaced 01/25/2011  . Long term current use of anticoagulant therapy 06/25/2010  . INFECTIOUS DIARRHEA 03/20/2008  . DIARRHEA-PRESUMED INFECTIOUS 03/20/2008  . URTICARIA 03/13/2008  . FATIGUE 09/12/2007  . DIARRHEA 08/29/2007  . ABDOMINAL PAIN, GENERALIZED, CHRONIC 08/29/2007  . Allergic rhinitis 05/09/2007  . HIATAL HERNIA 05/09/2007  . ARTHRITIS 05/09/2007  . OSTEOPENIA 05/09/2007  . GERD 01/23/2007  . CANCER, ENDOMETRIUM 07/25/2006  . Dyslipidemia 07/25/2006  .  COMMON MIGRAINE 07/25/2006  . Essential hypertension 07/25/2006  . Hx Breast Cancer, IDC, Stage I 07/03/2003    Standley Brooking, PTA 06/02/20 12:18 PM   [facility] [address] [place], [place], [address] Phone:  [phone]   Fax:  [phone]  Name: SHUNTEL FISHBURN MRN: [id number] Date of Birth: 1945-06-14

## 2020-06-13 ENCOUNTER — Other Ambulatory Visit: Payer: Self-pay | Admitting: Hematology & Oncology

## 2020-06-13 DIAGNOSIS — Z853 Personal history of malignant neoplasm of breast: Secondary | ICD-10-CM

## 2020-07-01 DIAGNOSIS — M65312 Trigger thumb, left thumb: Secondary | ICD-10-CM | POA: Diagnosis not present

## 2020-07-01 DIAGNOSIS — M1812 Unilateral primary osteoarthritis of first carpometacarpal joint, left hand: Secondary | ICD-10-CM | POA: Diagnosis not present

## 2020-07-03 DIAGNOSIS — M542 Cervicalgia: Secondary | ICD-10-CM | POA: Diagnosis not present

## 2020-07-03 DIAGNOSIS — M25511 Pain in right shoulder: Secondary | ICD-10-CM | POA: Diagnosis not present

## 2020-07-09 ENCOUNTER — Encounter: Payer: Self-pay | Admitting: Cardiovascular Disease

## 2020-07-09 ENCOUNTER — Ambulatory Visit (INDEPENDENT_AMBULATORY_CARE_PROVIDER_SITE_OTHER): Payer: Medicare PPO | Admitting: *Deleted

## 2020-07-09 ENCOUNTER — Ambulatory Visit: Payer: Medicare PPO | Admitting: Cardiovascular Disease

## 2020-07-09 ENCOUNTER — Other Ambulatory Visit: Payer: Self-pay

## 2020-07-09 VITALS — BP 100/62 | HR 63 | Ht 61.0 in | Wt 140.8 lb

## 2020-07-09 DIAGNOSIS — Z9861 Coronary angioplasty status: Secondary | ICD-10-CM | POA: Diagnosis not present

## 2020-07-09 DIAGNOSIS — I5043 Acute on chronic combined systolic (congestive) and diastolic (congestive) heart failure: Secondary | ICD-10-CM

## 2020-07-09 DIAGNOSIS — I5042 Chronic combined systolic (congestive) and diastolic (congestive) heart failure: Secondary | ICD-10-CM

## 2020-07-09 DIAGNOSIS — I2510 Atherosclerotic heart disease of native coronary artery without angina pectoris: Secondary | ICD-10-CM

## 2020-07-09 DIAGNOSIS — Z952 Presence of prosthetic heart valve: Secondary | ICD-10-CM | POA: Diagnosis not present

## 2020-07-09 DIAGNOSIS — Z5181 Encounter for therapeutic drug level monitoring: Secondary | ICD-10-CM

## 2020-07-09 LAB — BASIC METABOLIC PANEL
BUN/Creatinine Ratio: 25 (ref 12–28)
BUN: 24 mg/dL (ref 8–27)
CO2: 26 mmol/L (ref 20–29)
Calcium: 9.7 mg/dL (ref 8.7–10.3)
Chloride: 102 mmol/L (ref 96–106)
Creatinine, Ser: 0.97 mg/dL (ref 0.57–1.00)
Glucose: 98 mg/dL (ref 65–99)
Potassium: 4.6 mmol/L (ref 3.5–5.2)
Sodium: 141 mmol/L (ref 134–144)
eGFR: 62 mL/min/{1.73_m2} (ref >59)

## 2020-07-09 LAB — POCT INR: INR: 1.9 — AB (ref 2.0–3.0)

## 2020-07-09 LAB — LIPID PANEL
Chol/HDL Ratio: 2.7 ratio (ref 0.0–4.4)
Cholesterol, Total: 144 mg/dL (ref 100–199)
HDL: 53 mg/dL (ref >39)
LDL Chol Calc (NIH): 61 mg/dL (ref 0–99)
Triglycerides: 183 mg/dL — ABNORMAL HIGH (ref 0–149)
VLDL Cholesterol Cal: 30 mg/dL (ref 5–40)

## 2020-07-09 LAB — ALT: ALT: 21 IU/L (ref 0–32)

## 2020-07-09 NOTE — Progress Notes (Signed)
Amanda Wiggins Date of Birth  Feb 25, 1946 [facility] [address]    [place], [place]  [address] [phone]  Fax  [phone]  Problem List 1. Aortic valve replacement 2. Hyperlipidemia 3. Breast cancer 4. CAD -  1. Mid LAD to Dist LAD lesion, 100% stenosed. Post intervention with a single Synergy DES 2.25 mm x 20 mm S/p mini vision stent to distal LAD     Amanda Wiggins is a 75 year old female with a history of aortic stenosis-status post aortic valve replacement.  She also has a history of hypercholesterolemia.  She's not had any episodes of chest pain or shortness of breath.  She's had her INR levels checked in our Coumadin clinic. Her INR levels have all been therapeutic.  Nov. 6 , 2014  Amanda Wiggins is doing well.  i saw her a year ago.  She is 10 years our from her breast cancer and is doing well.    No cardiac complaints.   Able to do all of her normal activities.     Nov. 13, 2015:  Amanda Wiggins is doing well.   No CP .  No dyspnea.   Has been stressful.  Her mother died this past year.   Her INR levels have been  Well controlled   Nov. 14, 2016:  Doing well.  No cardiac issues.  INR levels have been stable,  A bit low the last time .  Had an AVR 13 years ago .    June 05, 2015:  Amanda Wiggins was recently seen at the hospital for an acute anterior wall ST segment elevation myocardial infarction. She had stenting of her mid LAD. She had recurrent pain on the day of her original discharge and was taken back to the Cath Lab and had a second stent placed. She continued to have some intermittent episodes of chest discomfort. She has had persistent ST segment elevation. Her left ventricular systolic function is moderately depressed with an EF of 40-45%.   She has akinesis of the distal anterior wall and apex.   She is not having the band like chest pain that she had on presentation Has had some GERD.   Asked about going back on Omeprazole  - we discussed her need for Protonix with  the plavix .   September 01, 2015:  Amanda Wiggins is seen back for follow up visit. Still having trouble getting over her MI .  Still fatigued but is slowly getting better.   No CP.      Dec. 12, 2017:  feeling ok Does not have the energy that she wants ( and used to have prior to her MI )  BP has been in the 90-100 range.   September 21, 2016:    Amanda Wiggins is seen today for her AVR and CAD / MI  Her husband, [name], died last week  Has some leg aches.   Feb. 13, 2019: Amanda Wiggins is seen back today for follow up of her AVR and CHF Echo shows LV EF of 40% , mean AV gradient is 21 mmHg.  Is on coumadin and plavix  Has occasional left shoulder pain  Had stenting of LAD in March 2017.    Has had some balance issues.   Saw ENT. Thinks it may be orthostatic hypotension  Had ears cleaned out.   Feels better  Aug. 26, 2019:  Doing well. Has CAD, AVR   No recent CP , Gets winded with little exertion EF is 40%.    Bruises  quite a bit - is on coumadin and plavix   Feb. 24, 2020:  Seen for follow up of her CAD and AVR. Continues to have balance issues.   Lists to one side. She has been going to PT .   Was thought to be due to muscle weakness possibly related to the crestor.   She is on Pralulent  And her balance and muscle strength is better.  Works out at [employer] and is doing yoga   January, 2021:  Amanda Wiggins is seen today for a follow-up visit.  She was in the emergency room last week with chest pain. The pain was described as an achiness located under both breast.  It did been there for a week.  Pain seems to be different than her angina equivalent.  She had -2 - troponins while in the emergency room and was discharged in satisfactory condition.  The pain settled in her left shoulder blade .  No dyspnea, no pleuretic cp   Sept. 2, 2021  Amanda Wiggins is seen today for follow up of her AVR, CAD, CHF Recent echo revealed reduced LV function with EF 30-35% We brought her back to start Entresto but her BP was too low  to start Entresto.   Was started on Losartan instead.  Was started on Lasix 20 mg 3 times a weeks.    Is avoiding salt and salty foods.   January 10, 2020: Amanda Wiggins is seen today for follow-up visit.  She has a history of aortic valve replacement, coronary artery disease, congestive heart failure.  Her echocardiogram shows an ejection fraction of 74 to 35%.  We have started her on losartan and lasix   She has been changed to [facility] .  Is off lasix .   Feeling better on the [employer]   Has had some left arm / neck pain   Jul 09, 2020:  Amanda Wiggins is seen today for follow up of her AVR, CAD, CHF Is on Entresto . Seems to be tolerating it well Was falling  But with PT she is getting stronger and has not fallen in a while  Is going to the gym regularly ,   INR is 1.9 today  Is tolerating the low dose entresto BP is marginal but no orthostasis      Current Outpatient Medications on File Prior to Visit  Medication Sig Dispense Refill  . amoxicillin (AMOXIL) 500 MG capsule For dental work    . BIOTIN PO Take 1 tablet by mouth every morning.    . Calcium Carbonate-Vitamin D 600-400 MG-UNIT tablet Take 1 tablet by mouth 2 (two) times daily.    . carvedilol (COREG) 3.125 MG tablet Take 1 tablet (3.125 mg total) by mouth 2 (two) times daily with a meal. 180 tablet 3  . clopidogrel (PLAVIX) 75 MG tablet TAKE 1 TABLET (75 MG TOTAL) BY MOUTH DAILY WITH BREAKFAST. 90 tablet 3  . cycloSPORINE (RESTASIS) 0.05 % ophthalmic emulsion Place 1 drop into both eyes 2 (two) times daily as needed (DRY EYE).    . Desoximetasone 0.05 % GEL Apply 1 application topically as needed (AS NEEDED TO AFFECTED AREA).     . Glucosamine-Chondroit-Vit C-Mn (GLUCOSAMINE CHONDR 1500 COMPLX) CAPS Take 1 capsule by mouth every morning.     . loratadine (CLARITIN) 10 MG tablet Take 10 mg by mouth daily.    . Multiple Vitamin (MULTIVITAMIN) capsule Take 1 capsule by mouth daily.    . nitroGLYCERIN (NITROSTAT) 0.4 MG SL tablet Place 1  tablet (0.4 mg total) under the tongue every 5 (five) minutes as needed for chest pain. 25 tablet 6  . pantoprazole (PROTONIX) 40 MG tablet TAKE 1 TABLET BY MOUTH EVERY DAY 90 tablet 3  . PRALUENT 75 MG/ML SOAJ INJECT 1 PEN INTO THE SKIN EVERY 14 (FOURTEEN) DAYS. 2 mL 11  . sacubitril-valsartan (ENTRESTO) 24-26 MG Take 1 tablet by mouth 2 (two) times daily. 180 tablet 3  . temazepam (RESTORIL) 30 MG capsule TAKE 1 CAPSULE BY MOUTH AT BEDTIME AS NEEDED FOR SLEEP 30 capsule 0  . warfarin (COUMADIN) 5 MG tablet TAKE AS DIRECTED BY COUMADIN CLINIC 90 tablet 1   No current facility-administered medications on file prior to visit.    Allergies  Allergen Reactions  . Latex Hives  . Meperidine Nausea Only  . Sulfa Antibiotics Nausea Only  . Crestor [Rosuvastatin Calcium] Other (See Comments)    Myalgias on 10mg  and 20mg  daily  . Pravastatin Other (See Comments)    myalgias  . Sulfamethoxazole Nausea And Vomiting  . Repatha [Evolocumab] Other (See Comments)    myalgias    Past Medical History:  Diagnosis Date  . Aortic valve replaced   . Breast cancer ([place]) 2005  . FHx: migraine headaches   . GERD (gastroesophageal reflux disease)   . Hiatal hernia   . Hx Breast Cancer, IDC, Stage I 07/03/2003  . Hx of breast cancer   . Hyperlipidemia   . Hypertension   . Menopausal syndrome   . Osteopenia   . 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 Personal history of radiation therapy   . Status post aortic valve repair     Past Surgical History:  Procedure Laterality Date  . AORTIC VALVE REPLACEMENT  2003  . BREAST BIOPSY    . BREAST LUMPECTOMY Left 2005  . CARDIAC CATHETERIZATION     Ejection Fraction   . CARDIAC CATHETERIZATION N/A 05/16/2015   Procedure: Left Heart Cath and Coronary Angiography;  Surgeon: Leonie Man, MD;  Location: [facility] [facility];  Service: Cardiovascular;  Laterality: N/A;  . CARDIAC CATHETERIZATION  05/16/2015   Procedure: Coronary Stent  Intervention;  Surgeon: Leonie Man, MD;  Location: [place] [facility];  Service: Cardiovascular;;  . CARDIAC CATHETERIZATION N/A 05/19/2015   Procedure: Left Heart Cath and Coronary Angiography;  Surgeon: Troy Sine, MD;  Location: [place] [facility];  Service: Cardiovascular;  Laterality: N/A;  . CARDIAC CATHETERIZATION N/A 05/19/2015   Procedure: Coronary Stent Intervention;  Surgeon: Troy Sine, MD;  Location: [place] [facility];  Service: Cardiovascular;  Laterality: N/A;  . HIATAL HERNIA REPAIR    . HIATAL HERNIA REPAIR  05/11/2007  . hysterectomy, endometiral cancer  2001  . lummpectomy breast cancer  07/03/2003  . nissan fundoplicaiton  [id number]   post-op hematoma on heparin   . right shouler surgery      Social History   Tobacco Use  Smoking Status Never Smoker  Smokeless Tobacco Never Used    Social History   Substance and Sexual Activity  Alcohol Use Yes  . Alcohol/week: 0.0 standard drinks   Comment: socially    Family History  Problem Relation Age of Onset  . Heart attack Mother 84  . Osteoarthritis Brother        hpercholesterolemia    Reviw of Systems:  Noted in current history, otherwise review of systems is negative.   Physical Exam: Blood pressure 100/62, pulse 63, height 5\' 1"  (1.549 m), weight  140 lb 12.8 oz (63.9 kg), SpO2 95 %.  GEN:  Well nourished, well developed in no acute distress HEENT: Normal NECK: No JVD; No carotid bruits LYMPHATICS: No lymphadenopathy CARDIAC: RRR ,  Mechanical S2. , soft systlic murmur  RESPIRATORY:  Clear to auscultation without rales, wheezing or rhonchi  ABDOMEN: Soft, non-tender, non-distended MUSCULOSKELETAL:  No edema; No deformity  SKIN: Warm and dry NEUROLOGIC:  Alert and oriented x 3   ECG:     Assessment / Plan:   1. Aortic valve replacement -       Valve sound great.   INR is 1.9. managed by our coumadin clinic   2. Hyperlipidemia -     Check labs today     3.  Chronic systolic/  diastolic congestive heart failure: She is now on carvedilol, Entresto 24-26 twice a day.    BP is marginal today .   No orthostasis ,     4. CAD  -      No angina .      Mertie Moores, MD  07/09/2020 11:26 AM    [place],  [place], [place]  [address] Pager [phone] Phone: [phone]; Fax: [phone]

## 2020-07-09 NOTE — Patient Instructions (Signed)
Medication Instructions:  Your physician recommends that you continue on your current medications as directed. Please refer to the Current Medication list given to you today.  *If you need a refill on your cardiac medications before your next appointment, please call your pharmacy*   Lab Work: TODAY:BMET, lipids, alt If you have labs (blood work) drawn today and your tests are completely normal, you will receive your results only by: Marland Kitchen MyChart Message (if you have MyChart) OR . A paper copy in the mail If you have any lab test that is abnormal or we need to change your treatment, we will call you to review the results.   Testing/Procedures: DUE IN 5 MONTHS Your physician has requested that you have an echocardiogram. Echocardiography is a painless test that uses sound waves to create images of your heart. It provides your doctor with information about the size and shape of your heart and how well your heart's chambers and valves are working. This procedure takes approximately one hour. There are no restrictions for this procedure.     Follow-Up: At [facility], you and your health needs are our priority.  As part of our continuing mission to provide you with exceptional heart care, we have created designated Provider Care Teams.  These Care Teams include your primary Cardiologist (physician) and Advanced Practice Providers (APPs -  Physician Assistants and Nurse Practitioners) who all work together to provide you with the care you need, when you need it.   Your next appointment:   6 month(s)  The format for your next appointment:   In Person  Provider:   Mertie Moores, MD

## 2020-07-09 NOTE — Patient Instructions (Signed)
Description   Today take 1.5 tablets then continue taking Warfarin 1/2 tablet daily except 1 tablet on Tuesdays, Thursdays, and Saturdays. Recheck in 4 weeks. Coumadin Clinic [phone]

## 2020-07-14 ENCOUNTER — Other Ambulatory Visit: Payer: Self-pay | Admitting: Hematology & Oncology

## 2020-07-14 DIAGNOSIS — Z853 Personal history of malignant neoplasm of breast: Secondary | ICD-10-CM

## 2020-07-20 ENCOUNTER — Other Ambulatory Visit: Payer: Self-pay | Admitting: Obstetrics

## 2020-07-20 DIAGNOSIS — Z1231 Encounter for screening mammogram for malignant neoplasm of breast: Secondary | ICD-10-CM

## 2020-07-31 DIAGNOSIS — M542 Cervicalgia: Secondary | ICD-10-CM | POA: Diagnosis not present

## 2020-07-31 DIAGNOSIS — M25511 Pain in right shoulder: Secondary | ICD-10-CM | POA: Diagnosis not present

## 2020-08-06 ENCOUNTER — Encounter: Payer: Self-pay | Admitting: Family Medicine

## 2020-08-07 ENCOUNTER — Other Ambulatory Visit: Payer: Self-pay

## 2020-08-07 ENCOUNTER — Ambulatory Visit (INDEPENDENT_AMBULATORY_CARE_PROVIDER_SITE_OTHER): Payer: Medicare PPO | Admitting: Pharmacist

## 2020-08-07 DIAGNOSIS — Z952 Presence of prosthetic heart valve: Secondary | ICD-10-CM | POA: Diagnosis not present

## 2020-08-07 DIAGNOSIS — Z5181 Encounter for therapeutic drug level monitoring: Secondary | ICD-10-CM

## 2020-08-07 LAB — POCT INR: INR: 1.8 — AB (ref 2.0–3.0)

## 2020-08-07 NOTE — Patient Instructions (Signed)
Description   Today take 1 tablet, then start taking Warfarin 1 tablet daily except 1/2 tablet on Mondays, Wednesdays, and Fridays. Recheck in 3 weeks. Coumadin Clinic [phone]

## 2020-08-10 DIAGNOSIS — M542 Cervicalgia: Secondary | ICD-10-CM | POA: Diagnosis not present

## 2020-08-10 DIAGNOSIS — M25511 Pain in right shoulder: Secondary | ICD-10-CM | POA: Diagnosis not present

## 2020-08-12 ENCOUNTER — Inpatient Hospital Stay: Payer: Medicare PPO

## 2020-08-12 ENCOUNTER — Other Ambulatory Visit: Payer: Self-pay

## 2020-08-12 ENCOUNTER — Encounter: Payer: Self-pay | Admitting: Hematology & Oncology

## 2020-08-12 ENCOUNTER — Telehealth: Payer: Self-pay

## 2020-08-12 ENCOUNTER — Inpatient Hospital Stay (HOSPITAL_BASED_OUTPATIENT_CLINIC_OR_DEPARTMENT_OTHER): Payer: Medicare PPO | Admitting: Hematology & Oncology

## 2020-08-12 ENCOUNTER — Inpatient Hospital Stay: Payer: Medicare PPO | Attending: Hematology & Oncology

## 2020-08-12 VITALS — HR 66 | Temp 98.2°F | Resp 18 | Ht 61.0 in | Wt 141.1 lb

## 2020-08-12 VITALS — BP 124/56

## 2020-08-12 DIAGNOSIS — Z853 Personal history of malignant neoplasm of breast: Secondary | ICD-10-CM | POA: Insufficient documentation

## 2020-08-12 DIAGNOSIS — Z882 Allergy status to sulfonamides status: Secondary | ICD-10-CM | POA: Diagnosis not present

## 2020-08-12 DIAGNOSIS — Z79899 Other long term (current) drug therapy: Secondary | ICD-10-CM | POA: Insufficient documentation

## 2020-08-12 DIAGNOSIS — Z952 Presence of prosthetic heart valve: Secondary | ICD-10-CM | POA: Insufficient documentation

## 2020-08-12 DIAGNOSIS — T386X5A Adverse effect of antigonadotrophins, antiestrogens, antiandrogens, not elsewhere classified, initial encounter: Secondary | ICD-10-CM

## 2020-08-12 DIAGNOSIS — Z885 Allergy status to narcotic agent status: Secondary | ICD-10-CM | POA: Diagnosis not present

## 2020-08-12 DIAGNOSIS — M818 Other osteoporosis without current pathological fracture: Secondary | ICD-10-CM

## 2020-08-12 LAB — CMP (CANCER CENTER ONLY)
ALT: 22 U/L (ref 0–44)
AST: 29 U/L (ref 15–41)
Albumin: 4.5 g/dL (ref 3.5–5.0)
Alkaline Phosphatase: 46 U/L (ref 38–126)
Anion gap: 6 (ref 5–15)
BUN: 21 mg/dL (ref 8–23)
CO2: 30 mmol/L (ref 22–32)
Calcium: 10 mg/dL (ref 8.9–10.3)
Chloride: 104 mmol/L (ref 98–111)
Creatinine: 0.99 mg/dL (ref 0.44–1.00)
GFR, Estimated: >60 mL/min (ref >60)
Glucose, Bld: 127 mg/dL — ABNORMAL HIGH (ref 70–99)
Potassium: 4.2 mmol/L (ref 3.5–5.1)
Sodium: 140 mmol/L (ref 135–145)
Total Bilirubin: 0.5 mg/dL (ref 0.3–1.2)
Total Protein: 6.7 g/dL (ref 6.5–8.1)

## 2020-08-12 LAB — CBC WITH DIFFERENTIAL (CANCER CENTER ONLY)
Abs Immature Granulocytes: 0.01 10*3/uL (ref 0.00–0.07)
Basophils Absolute: 0 10*3/uL (ref 0.0–0.1)
Basophils Relative: 0 %
Eosinophils Absolute: 0.1 10*3/uL (ref 0.0–0.5)
Eosinophils Relative: 2 %
HCT: 44.5 % (ref 36.0–46.0)
Hemoglobin: 14 g/dL (ref 12.0–15.0)
Immature Granulocytes: 0 %
Lymphocytes Relative: 23 %
Lymphs Abs: 1.3 10*3/uL (ref 0.7–4.0)
MCH: 28.6 pg (ref 26.0–34.0)
MCHC: 31.5 g/dL (ref 30.0–36.0)
MCV: 91 fL (ref 80.0–100.0)
Monocytes Absolute: 0.5 10*3/uL (ref 0.1–1.0)
Monocytes Relative: 8 %
Neutro Abs: 3.9 10*3/uL (ref 1.7–7.7)
Neutrophils Relative %: 67 %
Platelet Count: 324 10*3/uL (ref 150–400)
RBC: 4.89 MIL/uL (ref 3.87–5.11)
RDW: 13.2 % (ref 11.5–15.5)
WBC Count: 5.8 10*3/uL (ref 4.0–10.5)
nRBC: 0 % (ref 0.0–0.2)

## 2020-08-12 LAB — PROTIME-INR
INR: 2.3 — ABNORMAL HIGH (ref 0.8–1.2)
Prothrombin Time: 25.5 seconds — ABNORMAL HIGH (ref 11.4–15.2)

## 2020-08-12 MED ORDER — ZOLEDRONIC ACID 4 MG/100ML IV SOLN
4.0000 mg | Freq: Once | INTRAVENOUS | Status: AC
  Administered 2020-08-12: 4 mg via INTRAVENOUS
  Filled 2020-08-12: qty 100

## 2020-08-12 MED ORDER — SODIUM CHLORIDE 0.9 % IV SOLN
Freq: Once | INTRAVENOUS | Status: AC
Start: 2020-08-12 — End: 2020-08-12
  Filled 2020-08-12: qty 250

## 2020-08-12 NOTE — Patient Instructions (Signed)
Zoledronic Acid injection (Hypercalcemia, Oncology) (Zometa) What is this medicine? ZOLEDRONIC ACID (ZOE le dron ik AS id) lowers the amount of calcium loss from bone. It is used to treat too much calcium in your blood from cancer. It is also used to prevent complications of cancer that has spread to the bone. This medicine may be used for other purposes; ask your health care provider or pharmacist if you have questions. COMMON BRAND NAME(S): Zometa What should I tell my health care provider before I take this medicine? They need to know if you have any of these conditions: -aspirin-sensitive asthma -cancer, especially if you are receiving medicines used to treat cancer -dental disease or wear dentures -infection -kidney disease -receiving corticosteroids like dexamethasone or prednisone -an unusual or allergic reaction to zoledronic acid, other medicines, foods, dyes, or preservatives -pregnant or trying to get pregnant -breast-feeding How should I use this medicine? This medicine is for infusion into a vein. It is given by a health care professional in a hospital or clinic setting. Talk to your pediatrician regarding the use of this medicine in children. Special care may be needed. Overdosage: If you think you have taken too much of this medicine contact a poison control center or emergency room at once. NOTE: This medicine is only for you. Do not share this medicine with others. What if I miss a dose? It is important not to miss your dose. Call your doctor or health care professional if you are unable to keep an appointment. What may interact with this medicine? -certain antibiotics given by injection -NSAIDs, medicines for pain and inflammation, like ibuprofen or naproxen -some diuretics like bumetanide, furosemide -teriparatide -thalidomide This list may not describe all possible interactions. Give your health care provider a list of all the medicines, herbs, non-prescription drugs,  or dietary supplements you use. Also tell them if you smoke, drink alcohol, or use illegal drugs. Some items may interact with your medicine. What should I watch for while using this medicine? Visit your doctor or health care professional for regular checkups. It may be some time before you see the benefit from this medicine. Do not stop taking your medicine unless your doctor tells you to. Your doctor may order blood tests or other tests to see how you are doing. Women should inform their doctor if they wish to become pregnant or think they might be pregnant. There is a potential for serious side effects to an unborn child. Talk to your health care professional or pharmacist for more information. You should make sure that you get enough calcium and vitamin D while you are taking this medicine. Discuss the foods you eat and the vitamins you take with your health care professional. Some people who take this medicine have severe bone, joint, and/or muscle pain. This medicine may also increase your risk for jaw problems or a broken thigh bone. Tell your doctor right away if you have severe pain in your jaw, bones, joints, or muscles. Tell your doctor if you have any pain that does not go away or that gets worse. Tell your dentist and dental surgeon that you are taking this medicine. You should not have major dental surgery while on this medicine. See your dentist to have a dental exam and fix any dental problems before starting this medicine. Take good care of your teeth while on this medicine. Make sure you see your dentist for regular follow-up appointments. What side effects may I notice from receiving this medicine? Side effects   that you should report to your doctor or health care professional as soon as possible: -allergic reactions like skin rash, itching or hives, swelling of the face, lips, or tongue -anxiety, confusion, or depression -breathing problems -changes in vision -eye pain -feeling faint  or lightheaded, falls -jaw pain, especially after dental work -mouth sores -muscle cramps, stiffness, or weakness -redness, blistering, peeling or loosening of the skin, including inside the mouth -trouble passing urine or change in the amount of urine Side effects that usually do not require medical attention (report to your doctor or health care professional if they continue or are bothersome): -bone, joint, or muscle pain -constipation -diarrhea -fever -hair loss -irritation at site where injected -loss of appetite -nausea, vomiting -stomach upset -trouble sleeping -trouble swallowing -weak or tired This list may not describe all possible side effects. Call your doctor for medical advice about side effects. You may report side effects to FDA at 1-800-FDA-1088. Where should I keep my medicine? This drug is given in a hospital or clinic and will not be stored at home. NOTE: This sheet is a summary. It may not cover all possible information. If you have questions about this medicine, talk to your doctor, pharmacist, or health care provider.  2019 Elsevier/Gold Standard (2013-07-20 14:19:39)

## 2020-08-12 NOTE — Telephone Encounter (Signed)
appts made per 08/12/20 los   Savvy Peeters

## 2020-08-12 NOTE — Progress Notes (Signed)
Hematology and Oncology Follow Up Visit  Amanda Wiggins [id number] 02/10/1946 75 y.o. 08/12/2020   Principle Diagnosis:  1. Stage I (T1 N0 M0) ductal carcinoma of the left breast. 2. Mechanical aortic valve.  Current Therapy:     Coumadin-lifelong  Zometa 5 mg IV q. Year - given in June 2022     Interim History:  [name] is back for followup.  As always, she is doing pretty well.  She really has had no problems since we last saw her.  She is doing well with the Coumadin.  She has had no issues with respect to cardiac trouble.  She has had no problems with bowels or bladder.  She has had no headache.  She has had no issues with bowels or bladder..  I think she did fall at 1 point.  Thankfully she not have any fractures.  There is been no issues with COVID.  Overall, her performance status is ECOG 1.    Medications:  Current Outpatient Medications:  .  amoxicillin (AMOXIL) 500 MG capsule, For dental work, Disp: , Rfl:  .  BIOTIN PO, Take 1 tablet by mouth every morning., Disp: , Rfl:  .  Calcium Carbonate-Vitamin D 600-400 MG-UNIT tablet, Take 1 tablet by mouth 2 (two) times daily., Disp: , Rfl:  .  carvedilol (COREG) 3.125 MG tablet, Take 1 tablet (3.125 mg total) by mouth 2 (two) times daily with a meal., Disp: 180 tablet, Rfl: 3 .  clopidogrel (PLAVIX) 75 MG tablet, TAKE 1 TABLET (75 MG TOTAL) BY MOUTH DAILY WITH BREAKFAST., Disp: 90 tablet, Rfl: 3 .  cycloSPORINE (RESTASIS) 0.05 % ophthalmic emulsion, Place 1 drop into both eyes 2 (two) times daily as needed (DRY EYE)., Disp: , Rfl:  .  Desoximetasone 0.05 % GEL, Apply 1 application topically as needed (AS NEEDED TO AFFECTED AREA). , Disp: , Rfl:  .  Glucosamine-Chondroit-Vit C-Mn (GLUCOSAMINE CHONDR 1500 COMPLX) CAPS, Take 1 capsule by mouth every morning. , Disp: , Rfl:  .  loratadine (CLARITIN) 10 MG tablet, Take 10 mg by mouth daily., Disp: , Rfl:  .  Multiple Vitamin (MULTIVITAMIN) capsule, Take 1 capsule by mouth  daily., Disp: , Rfl:  .  nitroGLYCERIN (NITROSTAT) 0.4 MG SL tablet, Place 1 tablet (0.4 mg total) under the tongue every 5 (five) minutes as needed for chest pain., Disp: 25 tablet, Rfl: 6 .  pantoprazole (PROTONIX) 40 MG tablet, TAKE 1 TABLET BY MOUTH EVERY DAY, Disp: 90 tablet, Rfl: 3 .  PRALUENT 75 MG/ML SOAJ, INJECT 1 PEN INTO THE SKIN EVERY 14 (FOURTEEN) DAYS., Disp: 2 mL, Rfl: 11 .  sacubitril-valsartan (ENTRESTO) 24-26 MG, Take 1 tablet by mouth 2 (two) times daily., Disp: 180 tablet, Rfl: 3 .  temazepam (RESTORIL) 30 MG capsule, TAKE 1 CAPSULE BY MOUTH AT BEDTIME AS NEEDED FOR SLEEP, Disp: 30 capsule, Rfl: 0 .  warfarin (COUMADIN) 5 MG tablet, TAKE AS DIRECTED BY COUMADIN CLINIC, Disp: 90 tablet, Rfl: 1  Allergies:  Allergies  Allergen Reactions  . Latex Hives  . Meperidine Nausea Only  . Sulfa Antibiotics Nausea Only  . Crestor [Rosuvastatin Calcium] Other (See Comments)    Myalgias on 10mg  and 20mg  daily  . Pravastatin Other (See Comments)    myalgias  . Sulfamethoxazole Nausea And Vomiting  . Repatha [Evolocumab] Other (See Comments)    myalgias    Past Medical History, Surgical history, Social history, and Family History were reviewed and updated.  Review of Systems: Review of  Systems  Constitutional: Negative.   HENT: Negative.   Eyes: Negative.   Cardiovascular: Negative.   Gastrointestinal: Negative.   Genitourinary: Negative.   Musculoskeletal: Negative.   Skin: Negative.   Neurological: Negative.   Endo/Heme/Allergies: Negative.   Psychiatric/Behavioral: Negative.      Physical Exam:  height is 5\' 1"  (1.549 m) and weight is 64 kg. Her oral temperature is 98.2 F (36.8 C). Her pulse is 66. Her respiration is 18 and oxygen saturation is 94%.   Physical Exam Vitals reviewed.  HENT:     Head: Normocephalic and atraumatic.  Eyes:     Pupils: Pupils are equal, round, and reactive to light.  Cardiovascular:     Rate and Rhythm: Normal rate and regular  rhythm.     Heart sounds: Normal heart sounds.  Pulmonary:     Effort: Pulmonary effort is normal.     Breath sounds: Normal breath sounds.  Abdominal:     General: Bowel sounds are normal.     Palpations: Abdomen is soft.  Musculoskeletal:        General: No tenderness or deformity. Normal range of motion.     Cervical back: Normal range of motion.  Lymphadenopathy:     Cervical: No cervical adenopathy.  Skin:    General: Skin is warm and dry.     Findings: No erythema or rash.  Neurological:     Mental Status: She is alert and oriented to person, place, and time.  Psychiatric:        Behavior: Behavior normal.        Thought Content: Thought content normal.        Judgment: Judgment normal.     Lab Results  Component Value Date   WBC 5.8 08/12/2020   HGB 14.0 08/12/2020   HCT 44.5 08/12/2020   MCV 91.0 08/12/2020   PLT 324 08/12/2020     Chemistry      Component Value Date/Time   NA 141 07/09/2020 1152   NA 147 (H) 02/27/2017 0952   NA 142 02/12/2015 1125   K 4.6 07/09/2020 1152   K 4.3 02/27/2017 0952   K 3.8 02/12/2015 1125   CL 102 07/09/2020 1152   CL 106 02/27/2017 0952   CO2 26 07/09/2020 1152   CO2 30 02/27/2017 0952   CO2 24 02/12/2015 1125   BUN 24 07/09/2020 1152   BUN 14 02/27/2017 0952   BUN 14.7 02/12/2015 1125   CREATININE 0.97 07/09/2020 1152   CREATININE 0.92 08/13/2019 0931   CREATININE 0.9 02/27/2017 0952   CREATININE 1.0 02/12/2015 1125      Component Value Date/Time   CALCIUM 9.7 07/09/2020 1152   CALCIUM 9.2 02/27/2017 0952   CALCIUM 10.2 02/12/2015 1125   ALKPHOS 53 01/10/2020 1133   ALKPHOS 55 02/27/2017 0952   ALKPHOS 56 02/12/2015 1125   AST 25 01/10/2020 1133   AST 24 08/13/2019 0931   AST 35 (H) 02/12/2015 1125   ALT 21 07/09/2020 1152   ALT 18 08/13/2019 0931   ALT 22 02/27/2017 0952   ALT 39 02/12/2015 1125   BILITOT 0.3 01/10/2020 1133   BILITOT 0.3 08/13/2019 0931   BILITOT 0.45 02/12/2015 1125        Impression and Plan: Amanda Wiggins is 75 year old female with a history of stage I infiltrating duct carcinoma the left breast. She underwent lumpectomy.  She  had been on Femara. She had radiation. It has been 15 years now.  Everything looks  good right now.  I do not see any problems from a malignant point of view.  I do not see any evidence of recurrent breast cancer.  We will go ahead and plan to get her back in 12 months.    We will see her back, we will do her Zometa.  Volanda Napoleon, MD 6/8/202210:37 AM

## 2020-08-14 DIAGNOSIS — M542 Cervicalgia: Secondary | ICD-10-CM | POA: Diagnosis not present

## 2020-08-14 LAB — VITAMIN D 25 HYDROXY (VIT D DEFICIENCY, FRACTURES): Vit D, 25-Hydroxy: 48.99 ng/mL (ref 30–100)

## 2020-08-16 ENCOUNTER — Other Ambulatory Visit: Payer: Self-pay | Admitting: Hematology & Oncology

## 2020-08-16 DIAGNOSIS — Z853 Personal history of malignant neoplasm of breast: Secondary | ICD-10-CM

## 2020-08-17 ENCOUNTER — Encounter: Payer: Self-pay | Admitting: Family

## 2020-08-28 ENCOUNTER — Other Ambulatory Visit: Payer: Self-pay

## 2020-08-28 ENCOUNTER — Ambulatory Visit (INDEPENDENT_AMBULATORY_CARE_PROVIDER_SITE_OTHER): Payer: Medicare PPO | Admitting: Pharmacist

## 2020-08-28 DIAGNOSIS — Z952 Presence of prosthetic heart valve: Secondary | ICD-10-CM | POA: Diagnosis not present

## 2020-08-28 DIAGNOSIS — Z5181 Encounter for therapeutic drug level monitoring: Secondary | ICD-10-CM

## 2020-08-28 LAB — POCT INR: INR: 2.5 (ref 2.0–3.0)

## 2020-08-28 NOTE — Patient Instructions (Signed)
Continue taking warfarin 1 tablet daily except 1/2 tablet on Mondays, Wednesdays, and Fridays. Recheck in 4 weeks. [facility] [phone]

## 2020-09-14 ENCOUNTER — Other Ambulatory Visit: Payer: Self-pay | Admitting: Physician Assistant

## 2020-09-14 ENCOUNTER — Other Ambulatory Visit: Payer: Self-pay | Admitting: Hematology & Oncology

## 2020-09-14 DIAGNOSIS — G4701 Insomnia due to medical condition: Secondary | ICD-10-CM

## 2020-09-14 DIAGNOSIS — Z853 Personal history of malignant neoplasm of breast: Secondary | ICD-10-CM

## 2020-09-14 DIAGNOSIS — C549 Malignant neoplasm of corpus uteri, unspecified: Secondary | ICD-10-CM

## 2020-09-14 DIAGNOSIS — Z7901 Long term (current) use of anticoagulants: Secondary | ICD-10-CM

## 2020-09-15 ENCOUNTER — Other Ambulatory Visit: Payer: Self-pay

## 2020-09-15 ENCOUNTER — Encounter: Payer: Self-pay | Admitting: Family

## 2020-09-15 ENCOUNTER — Ambulatory Visit
Admission: RE | Admit: 2020-09-15 | Discharge: 2020-09-15 | Disposition: A | Discharge status: Home / Self Care | Payer: Medicare PPO | Source: Ambulatory Visit | Attending: Obstetrics | Admitting: Obstetrics

## 2020-09-15 DIAGNOSIS — Z1231 Encounter for screening mammogram for malignant neoplasm of breast: Secondary | ICD-10-CM | POA: Diagnosis not present

## 2020-09-24 ENCOUNTER — Other Ambulatory Visit: Payer: Self-pay

## 2020-09-24 ENCOUNTER — Ambulatory Visit (INDEPENDENT_AMBULATORY_CARE_PROVIDER_SITE_OTHER): Payer: Medicare PPO | Admitting: *Deleted

## 2020-09-24 DIAGNOSIS — Z5181 Encounter for therapeutic drug level monitoring: Secondary | ICD-10-CM

## 2020-09-24 DIAGNOSIS — Z952 Presence of prosthetic heart valve: Secondary | ICD-10-CM | POA: Diagnosis not present

## 2020-09-24 LAB — POCT INR: INR: 3.5 — AB (ref 2.0–3.0)

## 2020-09-24 NOTE — Patient Instructions (Signed)
Description   Do not take any Warfarin today then continue taking warfarin 1 tablet daily except 1/2 tablet on Mondays, Wednesdays, and Fridays. Recheck in 2 weeks. [facility]in [facility] [phone]

## 2020-10-07 ENCOUNTER — Encounter: Payer: Medicare PPO | Admitting: Family Medicine

## 2020-10-08 ENCOUNTER — Encounter: Payer: Self-pay | Admitting: Physician Assistant

## 2020-10-08 ENCOUNTER — Ambulatory Visit: Payer: Medicare PPO | Admitting: Physician Assistant

## 2020-10-08 ENCOUNTER — Other Ambulatory Visit: Payer: Self-pay

## 2020-10-08 DIAGNOSIS — Z1283 Encounter for screening for malignant neoplasm of skin: Secondary | ICD-10-CM | POA: Diagnosis not present

## 2020-10-08 DIAGNOSIS — L570 Actinic keratosis: Secondary | ICD-10-CM

## 2020-10-09 ENCOUNTER — Ambulatory Visit (INDEPENDENT_AMBULATORY_CARE_PROVIDER_SITE_OTHER): Payer: Medicare PPO

## 2020-10-09 DIAGNOSIS — Z5181 Encounter for therapeutic drug level monitoring: Secondary | ICD-10-CM

## 2020-10-09 DIAGNOSIS — Z952 Presence of prosthetic heart valve: Secondary | ICD-10-CM | POA: Diagnosis not present

## 2020-10-09 LAB — POCT INR: INR: 3.5 — AB (ref 2.0–3.0)

## 2020-10-09 NOTE — Patient Instructions (Signed)
Description   Do not take any Warfarin today then start taking warfarin 1 tablet daily except 1/2 tablet on Sunday, Mondays, Wednesdays, and Fridays. Recheck in 2 weeks. [facility]in Clinic [phone]

## 2020-10-12 ENCOUNTER — Encounter: Payer: Medicare PPO | Admitting: Family Medicine

## 2020-10-12 ENCOUNTER — Ambulatory Visit: Payer: Medicare PPO

## 2020-10-14 ENCOUNTER — Encounter: Payer: Self-pay | Admitting: Physician Assistant

## 2020-10-14 NOTE — Progress Notes (Signed)
   Follow-Up Visit   Subjective  Amanda Wiggins is a 75 y.o. female who presents for the following: Annual Exam (Patient here today for yearly skin check. No concerns. No personal history or family history of atypical moles, melanoma or non mole skin cancer. ).   The following portions of the chart were reviewed this encounter and updated as appropriate:  Tobacco  Allergies  Meds  Problems  Med Hx  Surg Hx  Fam Hx      Objective  Well appearing patient in no apparent distress; mood and affect are within normal limits.  A full examination was performed including scalp, head, eyes, ears, nose, lips, neck, chest, axillae, abdomen, back, buttocks, bilateral upper extremities, bilateral lower extremities, hands, feet, fingers, toes, fingernails, and toenails. All findings within normal limits unless otherwise noted below.  Chest - Medial [facility]) Erythematous patches with gritty scale.  Assessment & Plan  AK (actinic keratosis) Chest - Medial (Center)  Destruction of lesion - Chest - Medial (Center) Complexity: simple   Destruction method: cryotherapy   Informed consent: discussed and consent obtained   Timeout:  patient name, date of birth, surgical site, and procedure verified Lesion destroyed using liquid nitrogen: Yes   Cryotherapy cycles:  3 Outcome: patient tolerated procedure well with no complications     I, Averil Digman, PA-C, have reviewed all documentation's for this visit.  The documentation on 10/14/20 for the exam, diagnosis, procedures and orders are all accurate and complete.

## 2020-10-15 ENCOUNTER — Other Ambulatory Visit: Payer: Self-pay | Admitting: Hematology & Oncology

## 2020-10-15 DIAGNOSIS — Z853 Personal history of malignant neoplasm of breast: Secondary | ICD-10-CM

## 2020-10-22 ENCOUNTER — Other Ambulatory Visit: Payer: Self-pay

## 2020-10-22 ENCOUNTER — Ambulatory Visit (INDEPENDENT_AMBULATORY_CARE_PROVIDER_SITE_OTHER): Payer: Medicare PPO | Admitting: *Deleted

## 2020-10-22 DIAGNOSIS — Z952 Presence of prosthetic heart valve: Secondary | ICD-10-CM

## 2020-10-22 DIAGNOSIS — Z5181 Encounter for therapeutic drug level monitoring: Secondary | ICD-10-CM | POA: Diagnosis not present

## 2020-10-22 LAB — POCT INR: INR: 3.5 — AB (ref 2.0–3.0)

## 2020-10-22 NOTE — Patient Instructions (Signed)
Description   Do not take any Warfarin today then start taking warfarin 1/2 tablet daily except 1 tablet on Tuesdays and Thursdays. Recheck in 2 weeks. Coumadin Clinic [phone]

## 2020-11-05 ENCOUNTER — Ambulatory Visit (INDEPENDENT_AMBULATORY_CARE_PROVIDER_SITE_OTHER): Payer: Medicare PPO | Admitting: Family Medicine

## 2020-11-05 ENCOUNTER — Ambulatory Visit (INDEPENDENT_AMBULATORY_CARE_PROVIDER_SITE_OTHER): Payer: Medicare PPO

## 2020-11-05 ENCOUNTER — Encounter: Payer: Self-pay | Admitting: Family Medicine

## 2020-11-05 ENCOUNTER — Other Ambulatory Visit: Payer: Self-pay

## 2020-11-05 VITALS — BP 122/82 | HR 66 | Temp 98.1°F | Ht 61.0 in | Wt 142.0 lb

## 2020-11-05 VITALS — BP 122/82 | HR 66 | Temp 98.1°F | Ht 61.25 in | Wt 142.2 lb

## 2020-11-05 DIAGNOSIS — Z5181 Encounter for therapeutic drug level monitoring: Secondary | ICD-10-CM | POA: Diagnosis not present

## 2020-11-05 DIAGNOSIS — R195 Other fecal abnormalities: Secondary | ICD-10-CM | POA: Diagnosis not present

## 2020-11-05 DIAGNOSIS — Z23 Encounter for immunization: Secondary | ICD-10-CM | POA: Diagnosis not present

## 2020-11-05 DIAGNOSIS — E785 Hyperlipidemia, unspecified: Secondary | ICD-10-CM

## 2020-11-05 DIAGNOSIS — M8580 Other specified disorders of bone density and structure, unspecified site: Secondary | ICD-10-CM

## 2020-11-05 DIAGNOSIS — I10 Essential (primary) hypertension: Secondary | ICD-10-CM

## 2020-11-05 DIAGNOSIS — Z0000 Encounter for general adult medical examination without abnormal findings: Secondary | ICD-10-CM

## 2020-11-05 DIAGNOSIS — Z0001 Encounter for general adult medical examination with abnormal findings: Secondary | ICD-10-CM

## 2020-11-05 DIAGNOSIS — M85852 Other specified disorders of bone density and structure, left thigh: Secondary | ICD-10-CM | POA: Diagnosis not present

## 2020-11-05 DIAGNOSIS — I5022 Chronic systolic (congestive) heart failure: Secondary | ICD-10-CM | POA: Diagnosis not present

## 2020-11-05 DIAGNOSIS — Z952 Presence of prosthetic heart valve: Secondary | ICD-10-CM

## 2020-11-05 DIAGNOSIS — M85851 Other specified disorders of bone density and structure, right thigh: Secondary | ICD-10-CM | POA: Diagnosis not present

## 2020-11-05 LAB — LIPID PANEL
Cholesterol: 131 mg/dL (ref 0–200)
HDL: 48.6 mg/dL (ref >39.00)
LDL Cholesterol: 52 mg/dL (ref 0–99)
NonHDL: 82.79
Total CHOL/HDL Ratio: 3
Triglycerides: 156 mg/dL — ABNORMAL HIGH (ref 0.0–149.0)
VLDL: 31.2 mg/dL (ref 0.0–40.0)

## 2020-11-05 LAB — CBC WITH DIFFERENTIAL/PLATELET
Basophils Absolute: 0 10*3/uL (ref 0.0–0.1)
Basophils Relative: 0.5 % (ref 0.0–3.0)
Eosinophils Absolute: 0.2 10*3/uL (ref 0.0–0.7)
Eosinophils Relative: 3 % (ref 0.0–5.0)
HCT: 41.8 % (ref 36.0–46.0)
Hemoglobin: 13.7 g/dL (ref 12.0–15.0)
Lymphocytes Relative: 25.3 % (ref 12.0–46.0)
Lymphs Abs: 1.6 10*3/uL (ref 0.7–4.0)
MCHC: 32.8 g/dL (ref 30.0–36.0)
MCV: 88 fl (ref 78.0–100.0)
Monocytes Absolute: 0.6 10*3/uL (ref 0.1–1.0)
Monocytes Relative: 9.4 % (ref 3.0–12.0)
Neutro Abs: 3.9 10*3/uL (ref 1.4–7.7)
Neutrophils Relative %: 61.8 % (ref 43.0–77.0)
Platelets: 325 10*3/uL (ref 150.0–400.0)
RBC: 4.75 Mil/uL (ref 3.87–5.11)
RDW: 13.3 % (ref 11.5–15.5)
WBC: 6.2 10*3/uL (ref 4.0–10.5)

## 2020-11-05 LAB — T4, FREE: Free T4: 0.96 ng/dL (ref 0.60–1.60)

## 2020-11-05 LAB — COMPREHENSIVE METABOLIC PANEL
ALT: 18 U/L (ref 0–35)
AST: 24 U/L (ref 0–37)
Albumin: 4.3 g/dL (ref 3.5–5.2)
Alkaline Phosphatase: 45 U/L (ref 39–117)
BUN: 18 mg/dL (ref 6–23)
CO2: 29 mEq/L (ref 19–32)
Calcium: 9.6 mg/dL (ref 8.4–10.5)
Chloride: 104 mEq/L (ref 96–112)
Creatinine, Ser: 0.92 mg/dL (ref 0.40–1.20)
GFR: 61.66 mL/min (ref >60.00)
Glucose, Bld: 104 mg/dL — ABNORMAL HIGH (ref 70–99)
Potassium: 4.4 mEq/L (ref 3.5–5.1)
Sodium: 140 mEq/L (ref 135–145)
Total Bilirubin: 0.4 mg/dL (ref 0.2–1.2)
Total Protein: 6.7 g/dL (ref 6.0–8.3)

## 2020-11-05 LAB — HEMOGLOBIN A1C: Hgb A1c MFr Bld: 6.7 % — ABNORMAL HIGH (ref 4.6–6.5)

## 2020-11-05 LAB — POCT INR: INR: 3.1 — AB (ref 2.0–3.0)

## 2020-11-05 LAB — VITAMIN D 25 HYDROXY (VIT D DEFICIENCY, FRACTURES): VITD: 65.18 ng/mL (ref 30.00–100.00)

## 2020-11-05 LAB — TSH: TSH: 2.69 u[IU]/mL (ref 0.35–5.50)

## 2020-11-05 NOTE — Patient Instructions (Signed)
Amanda Wiggins , Thank you for taking time to come for your Medicare Wellness Visit. I appreciate your ongoing commitment to your health goals. Please review the following plan we discussed and let me know if I can assist you in the future.   Screening recommendations/referrals: Colonoscopy: 08/26/2016  due 2028 Mammogram: 09/15/2020 Bone Density: 04/21/2020 Recommended yearly ophthalmology/optometry visit for glaucoma screening and checkup Recommended yearly dental visit for hygiene and checkup  Vaccinations: Influenza vaccine: completed  Pneumococcal vaccine: completed series  Tdap vaccine: 02/05/2016 Shingles vaccine: completed     Advanced directives: will provide copies   Conditions/risks identified: none   Next appointment: none    Preventive Care 34 Years and Older, Female Preventive care refers to lifestyle choices and visits with your health care provider that can promote health and wellness. What does preventive care include? A yearly physical exam. This is also called an annual well check. Dental exams once or twice a year. Routine eye exams. Ask your health care provider how often you should have your eyes checked. Personal lifestyle choices, including: Daily care of your teeth and gums. Regular physical activity. Eating a healthy diet. Avoiding tobacco and drug use. Limiting alcohol use. Practicing safe sex. Taking low-dose aspirin every day. Taking vitamin and mineral supplements as recommended by your health care provider. What happens during an annual well check? The services and screenings done by your health care provider during your annual well check will depend on your age, overall health, lifestyle risk factors, and family history of disease. Counseling  Your health care provider may ask you questions about your: Alcohol use. Tobacco use. Drug use. Emotional well-being. Home and relationship well-being. Sexual activity. Eating habits. History of  falls. Memory and ability to understand (cognition). Work and work Statistician. Reproductive health. Screening  You may have the following tests or measurements: Height, weight, and BMI. Blood pressure. Lipid and cholesterol levels. These may be checked every 5 years, or more frequently if you are over 49 years old. Skin check. Lung cancer screening. You may have this screening every year starting at age 26 if you have a 30-pack-year history of smoking and currently smoke or have quit within the past 15 years. Fecal occult blood test (FOBT) of the stool. You may have this test every year starting at age 34. Flexible sigmoidoscopy or colonoscopy. You may have a sigmoidoscopy every 5 years or a colonoscopy every 10 years starting at age 73. Hepatitis C blood test. Hepatitis B blood test. Sexually transmitted disease (STD) testing. Diabetes screening. This is done by checking your blood sugar (glucose) after you have not eaten for a while (fasting). You may have this done every 1-3 years. Bone density scan. This is done to screen for osteoporosis. You may have this done starting at age 48. Mammogram. This may be done every 1-2 years. Talk to your health care provider about how often you should have regular mammograms. Talk with your health care provider about your test results, treatment options, and if necessary, the need for more tests. Vaccines  Your health care provider may recommend certain vaccines, such as: Influenza vaccine. This is recommended every year. Tetanus, diphtheria, and acellular pertussis (Tdap, Td) vaccine. You may need a Td booster every 10 years. Zoster vaccine. You may need this after age 18. Pneumococcal 13-valent conjugate (PCV13) vaccine. One dose is recommended after age 98. Pneumococcal polysaccharide (PPSV23) vaccine. One dose is recommended after age 64. Talk to your health care provider about which screenings and vaccines  you need and how often you need  them. This information is not intended to replace advice given to you by your health care provider. Make sure you discuss any questions you have with your health care provider. Document Released: 03/20/2015 Document Revised: 11/11/2015 Document Reviewed: 12/23/2014 Elsevier Interactive Patient Education  2017 [place] Prevention in the Home Falls can cause injuries. They can happen to people of all ages. There are many things you can do to make your home safe and to help prevent falls. What can I do on the outside of my home? Regularly fix the edges of walkways and driveways and fix any cracks. Remove anything that might make you trip as you walk through a door, such as a raised step or threshold. Trim any bushes or trees on the path to your home. Use bright outdoor lighting. Clear any walking paths of anything that might make someone trip, such as rocks or tools. Regularly check to see if handrails are loose or broken. Make sure that both sides of any steps have handrails. Any raised decks and porches should have guardrails on the edges. Have any leaves, snow, or ice cleared regularly. Use sand or salt on walking paths during winter. Clean up any spills in your garage right away. This includes oil or grease spills. What can I do in the bathroom? Use night lights. Install grab bars by the toilet and in the tub and shower. Do not use towel bars as grab bars. Use non-skid mats or decals in the tub or shower. If you need to sit down in the shower, use a plastic, non-slip stool. Keep the floor dry. Clean up any water that spills on the floor as soon as it happens. Remove soap buildup in the tub or shower regularly. Attach bath mats securely with double-sided non-slip rug tape. Do not have throw rugs and other things on the floor that can make you trip. What can I do in the bedroom? Use night lights. Make sure that you have a light by your bed that is easy to reach. Do not use  any sheets or blankets that are too big for your bed. They should not hang down onto the floor. Have a firm chair that has side arms. You can use this for support while you get dressed. Do not have throw rugs and other things on the floor that can make you trip. What can I do in the kitchen? Clean up any spills right away. Avoid walking on wet floors. Keep items that you use a lot in easy-to-reach places. If you need to reach something above you, use a strong step stool that has a grab bar. Keep electrical cords out of the way. Do not use floor polish or wax that makes floors slippery. If you must use wax, use non-skid floor wax. Do not have throw rugs and other things on the floor that can make you trip. What can I do with my stairs? Do not leave any items on the stairs. Make sure that there are handrails on both sides of the stairs and use them. Fix handrails that are broken or loose. Make sure that handrails are as long as the stairways. Check any carpeting to make sure that it is firmly attached to the stairs. Fix any carpet that is loose or worn. Avoid having throw rugs at the top or bottom of the stairs. If you do have throw rugs, attach them to the floor with carpet tape. Make sure  that you have a light switch at the top of the stairs and the bottom of the stairs. If you do not have them, ask someone to add them for you. What else can I do to help prevent falls? Wear shoes that: Do not have high heels. Have rubber bottoms. Are comfortable and fit you well. Are closed at the toe. Do not wear sandals. If you use a stepladder: Make sure that it is fully opened. Do not climb a closed stepladder. Make sure that both sides of the stepladder are locked into place. Ask someone to hold it for you, if possible. Clearly mark and make sure that you can see: Any grab bars or handrails. First and last steps. Where the edge of each step is. Use tools that help you move around (mobility aids)  if they are needed. These include: Canes. Walkers. Scooters. Crutches. Turn on the lights when you go into a dark area. Replace any light bulbs as soon as they burn out. Set up your furniture so you have a clear path. Avoid moving your furniture around. If any of your floors are uneven, fix them. If there are any pets around you, be aware of where they are. Review your medicines with your doctor. Some medicines can make you feel dizzy. This can increase your chance of falling. Ask your doctor what other things that you can do to help prevent falls. This information is not intended to replace advice given to you by your health care provider. Make sure you discuss any questions you have with your health care provider. Document Released: 12/18/2008 Document Revised: 07/30/2015 Document Reviewed: 03/28/2014 Elsevier Interactive Patient Education  2017 [employer].

## 2020-11-05 NOTE — Progress Notes (Signed)
Subjective:     Amanda Wiggins is a 75 y.o. female and is here for a comprehensive physical exam. The patient reports loose stools for 2-3 days 2-3 times per week.  Uses Imodium.  Started several weeks ago.  Patient notes recent difficulty regulating INR 2/2 the loose stools.  On Coumadin 2/2 aortic valve replacement.  Patient notes increasing symptoms when nervous/anxious.  Patient had bone density done this year, osteopenia noted.  Patient exercising regularly at the gym.  Patient has an upcoming appointment with cardiology to see if Delene Loll is improving heart function.  Social History   Socioeconomic History   Marital status: Widowed    Spouse name: Not on file   Number of children: Not on file   Years of education: Not on file   Highest education level: Not on file  Occupational History   Not on file  Tobacco Use   Smoking status: Never   Smokeless tobacco: Never  Vaping Use   Vaping Use: Never used  Substance and Sexual Activity   Alcohol use: Yes    Alcohol/week: 0.0 standard drinks    Comment: socially   Drug use: No   Sexual activity: Not on file  Other Topics Concern   Not on file  Social History Narrative   Not on file   Social Determinants of Health   Financial Resource Strain: Not on file  Food Insecurity: Not on file  Transportation Needs: Not on file  Physical Activity: Not on file  Stress: Not on file  Social Connections: Not on file  Intimate Partner Violence: Not on file   Health Maintenance  Topic Date Due   Zoster Vaccines- Shingrix (2 of 2) 09/14/2016   Fecal DNA (Cologuard)  07/29/2019   INFLUENZA VACCINE  10/05/2020   COVID-19 Vaccine (5 - Booster for Moderna series) 10/31/2020   MAMMOGRAM  09/15/2021   TETANUS/TDAP  02/24/2026   DEXA SCAN  Completed   Hepatitis C Screening  Completed   PNA vac Low Risk Adult  Completed   HPV VACCINES  Aged Out    The following portions of the patient's history were reviewed and updated as appropriate:  allergies, current medications, past family history, past medical history, past social history, past surgical history, and problem list.  Review of Systems Pertinent items noted in HPI and remainder of comprehensive ROS otherwise negative.   Objective:    BP 122/82 (BP Location: Left Arm, Patient Position: Sitting, Cuff Size: Normal)   Pulse 66   Temp 98.1 F (36.7 C) (Oral)   Ht 5' 1.25" (1.556 m)   Wt 142 lb 3.2 oz (64.5 kg)   SpO2 98%   BMI 26.65 kg/m  General appearance: alert, cooperative, and no distress Head: Normocephalic, without obvious abnormality, atraumatic Eyes: conjunctivae/corneas clear. PERRL, EOM's intact. Fundi benign. Ears: normal TM's and external ear canals both ears Nose: Nares normal. Septum midline. Mucosa normal. No drainage or sinus tenderness. Throat: lips, mucosa, and tongue normal; teeth and gums normal Neck: no adenopathy, no carotid bruit, no JVD, supple, symmetrical, trachea midline, and thyroid not enlarged, symmetric, no tenderness/mass/nodules Lungs: clear to auscultation bilaterally Heart: regular rate and rhythm, murmur best heard right upper chest, no rub or gallop Abdomen: soft, non-tender; bowel sounds normal; no masses,  no organomegaly Extremities: extremities normal, atraumatic, no cyanosis or edema Pulses: 2+ and symmetric Skin: Skin color, texture, turgor normal. No rashes or lesions Lymph nodes: Cervical, supraclavicular, and axillary nodes normal. Neurologic: Alert and oriented X 3,  normal strength and tone. Normal symmetric reflexes. Normal coordination and gait    Assessment:    Healthy female exam.      Plan:    Anticipatory guidance given including wearing seatbelts, smoke detectors in the home, increasing physical activity, increasing p.o. intake of water and vegetables. -labs -Colonoscopy up-to-date done 08/26/2016 -Mammogram up-to-date done 09/15/2020 Bone density done 04/21/2020 -Given handout -Neck CPE in 1 year See  After Visit Summary for Counseling Recommendations   Loose stools  -Discussed symptoms likely due to IBS-D as needed currently pt is stressed or with increased anxiety -Discussed other possible causes including SIBO, other malabsorption conditions -Consider probiotic -Discussed food diary, self-care, deep breathing, counseling -For continued or worsening symptoms GI follow-up. - Plan: CMP, CBC with Differential/Platelet, TSH, T4, Free, Hemoglobin A1c  Essential hypertension -Controlled -Continue lifestyle modifications -Continue current medications including Coreg 3.125 mg twice daily - Plan: CMP  Chronic systolic CHF (congestive heart failure) (HCC)  -Euvolemic -Continue current medications including Coreg 3.125 mg twice daily, Entresto 24-26 mg twice daily -Continue follow-up cardiology - Plan: CMP  Dyslipidemia  - Plan: Lipid panel  Osteopenia, bilateral femoral neck -Continue vitamin D, calcium, and Reclast -Continue weightbearing exercises -Discussed fall prevention - Plan: Vitamin D, 25-hydroxy  Need for influenza vaccination - Plan: Flu Vaccine QUAD High Dose(Fluad)  F/u prn  [name], MD

## 2020-11-05 NOTE — Patient Instructions (Signed)
Description   Take 1/2 tablet of Warfarin today then continue taking warfarin 1/2 tablet daily except 1 tablet on Tuesdays and Thursdays. Recheck in 3 weeks. Coumadin Clinic [phone]

## 2020-11-05 NOTE — Progress Notes (Signed)
Subjective:   Amanda Wiggins is a 75 y.o. female who presents for Medicare Annual (Subsequent) preventive examination.  Review of Systems    N/a       Objective:    There were no vitals filed for this visit. There is no height or weight on file to calculate BMI.  Advanced Directives 08/12/2020 04/23/2020 10/03/2019 08/13/2019 08/13/2018 02/12/2018 01/22/2018  Does Patient Have a Medical Advance Directive? Yes Yes Yes No Yes Yes Yes  Type of Paramedic of [place];Living will - [place];Living will - [place];Living will [place];Living will [place];Living will  Does patient want to make changes to medical advance directive? - - No - Patient declined No - Patient declined No - Patient declined No - Patient declined -  Copy of [place] in Chart? - - No - copy requested - No - copy requested No - copy requested -  Would patient like information on creating a medical advance directive? - - - No - Patient declined - - -    Current Medications (verified) Outpatient Encounter Medications as of 11/05/2020  Medication Sig   amoxicillin (AMOXIL) 500 MG capsule For dental work   BIOTIN PO Take 1 tablet by mouth every morning.   Calcium Carbonate-Vitamin D 600-400 MG-UNIT tablet Take 1 tablet by mouth 2 (two) times daily.   carvedilol (COREG) 3.125 MG tablet Take 1 tablet (3.125 mg total) by mouth 2 (two) times daily with a meal.   clopidogrel (PLAVIX) 75 MG tablet TAKE 1 TABLET (75 MG TOTAL) BY MOUTH DAILY WITH BREAKFAST.   cycloSPORINE (RESTASIS) 0.05 % ophthalmic emulsion Place 1 drop into both eyes 2 (two) times daily as needed (DRY EYE).   Desoximetasone 0.05 % GEL APPLY TO AFFECTED AREA ONCE TO 2 TIMES DAILY   Glucosamine-Chondroit-Vit C-Mn (GLUCOSAMINE CHONDR 1500 COMPLX) CAPS Take 1 capsule by mouth every morning.    loratadine (CLARITIN) 10 MG tablet Take 10 mg by mouth  daily.   Multiple Vitamin (MULTIVITAMIN) capsule Take 1 capsule by mouth daily.   nitroGLYCERIN (NITROSTAT) 0.4 MG SL tablet Place 1 tablet (0.4 mg total) under the tongue every 5 (five) minutes as needed for chest pain.   pantoprazole (PROTONIX) 40 MG tablet TAKE 1 TABLET BY MOUTH EVERY DAY   PRALUENT 75 MG/ML SOAJ INJECT 1 PEN INTO THE SKIN EVERY 14 (FOURTEEN) DAYS.   sacubitril-valsartan (ENTRESTO) 24-26 MG Take 1 tablet by mouth 2 (two) times daily.   temazepam (RESTORIL) 30 MG capsule TAKE 1 CAPSULE BY MOUTH AT BEDTIME AS NEEDED FOR SLEEP   warfarin (COUMADIN) 5 MG tablet TAKE AS DIRECTED BY COUMADIN CLINIC   No facility-administered encounter medications on file as of 11/05/2020.    Allergies (verified) Latex, Meperidine, Sulfa antibiotics, Crestor [rosuvastatin calcium], Pravastatin, Sulfamethoxazole, and Repatha [evolocumab]   History: Past Medical History:  Diagnosis Date   Aortic valve replaced    Breast cancer ([place]) 2005   FHx: migraine headaches    GERD (gastroesophageal reflux disease)    Hiatal hernia    Hx Breast Cancer, IDC, Stage I 07/03/2003   Hx of breast cancer    Hyperlipidemia    Hypertension    Menopausal syndrome    Osteopenia    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 08/22/2011   Personal history of radiation therapy    Status post aortic valve repair    Past Surgical History:  Procedure Laterality Date  AORTIC VALVE REPLACEMENT  2003   BREAST BIOPSY Left 2005   BREAST LUMPECTOMY Left 2005   CARDIAC CATHETERIZATION     Ejection Fraction    CARDIAC CATHETERIZATION N/A 05/16/2015   Procedure: Left Heart Cath and Coronary Angiography;  Surgeon: Leonie Man, MD;  Location: [place] [facility];  Service: Cardiovascular;  Laterality: N/A;   CARDIAC CATHETERIZATION  05/16/2015   Procedure: Coronary Stent Intervention;  Surgeon: Leonie Man, MD;  Location: [facility];  Service: Cardiovascular;;   CARDIAC CATHETERIZATION N/A 05/19/2015    Procedure: Left Heart Cath and Coronary Angiography;  Surgeon: Troy Sine, MD;  Location: [facility] [facility];  Service: Cardiovascular;  Laterality: N/A;   CARDIAC CATHETERIZATION N/A 05/19/2015   Procedure: Coronary Stent Intervention;  Surgeon: Troy Sine, MD;  Location: [facility];  Service: Cardiovascular;  Laterality: N/A;   HIATAL HERNIA REPAIR     HIATAL HERNIA REPAIR  05/11/2007   hysterectomy, endometiral cancer  2001   lummpectomy breast cancer  [id number]   nissan fundoplicaiton  [id number]   post-op hematoma on heparin    right shouler surgery     Family History  Problem Relation Age of Onset   Heart attack Mother 56   Osteoarthritis Brother        hpercholesterolemia   Social History   Socioeconomic History   Marital status: Widowed    Spouse name: Not on file   Number of children: Not on file   Years of education: Not on file   Highest education level: Not on file  Occupational History   Not on file  Tobacco Use   Smoking status: Never   Smokeless tobacco: Never  Vaping Use   Vaping Use: Never used  Substance and Sexual Activity   Alcohol use: Yes    Alcohol/week: 0.0 standard drinks    Comment: socially   Drug use: No   Sexual activity: Not on file  Other Topics Concern   Not on file  Social History Narrative   Not on file   Social Determinants of Health   Financial Resource Strain: Not on file  Food Insecurity: Not on file  Transportation Needs: Not on file  Physical Activity: Not on file  Stress: Not on file  Social Connections: Not on file    Tobacco Counseling Counseling given: Not Answered   Clinical Intake:                 Diabetic?no         Activities of Daily Living No flowsheet data found.  Patient Care Team: Billie Ruddy, MD as PCP - General (Family Medicine) Nahser, Wonda Cheng, MD as PCP - Cardiology (Cardiology) Izora Gala, MD as Consulting Physician (Otolaryngology) Volanda Napoleon, MD as  Consulting Physician (Oncology) [name], MD as Consulting Physician (Dermatology) Warren Danes, PA-C as Physician Assistant (Dermatology)  Indicate any recent Medical Services you may have received from other than [facility] providers in the past year (date may be approximate).     Assessment:   This is a routine wellness examination for Amanda Wiggins.  Hearing/Vision screen No results found.  Dietary issues and exercise activities discussed:     Goals Addressed   None    Depression Screen PHQ 2/9 Scores 11/05/2020 10/03/2019 09/05/2018 07/17/2017 09/25/2015 06/17/2015 06/20/2013  PHQ - 2 Score 0 0 0 0 0 1 0  PHQ- 9 Score 1 0 - - 1 4 -  Exception Documentation - - - - (  No Data) (No Data) -    Fall Risk Fall Risk  11/05/2020 10/03/2019 09/05/2018 07/17/2017 09/25/2015  Falls in the past year? 1 0 1 No No  Number falls in past yr: 0 0 0 - -  Injury with Fall? 0 0 1 - -  Risk for fall due to : - Medication side effect History of fall(s) - (No Data)  Risk for fall due to: Comment - - - - No history of falls  Follow up - Falls evaluation completed;Falls prevention discussed Falls evaluation completed - -    FALL RISK PREVENTION PERTAINING TO THE HOME:  Any stairs in or around the home? Yes  If so, are there any without handrails? No  Home free of loose throw rugs in walkways, pet beds, electrical cords, etc? Yes  Adequate lighting in your home to reduce risk of falls? Yes   ASSISTIVE DEVICES UTILIZED TO PREVENT FALLS:  Life alert? Yes  Use of a cane, walker or w/c? No  Grab bars in the bathroom? Yes  Shower chair or bench in shower? Yes  Elevated toilet seat or a handicapped toilet? Yes   TIMED UP AND GO:  Was the test performed? Yes .  Length of time to ambulate 10 feet: 8 sec.   Gait steady and fast without use of assistive device  Cognitive Function:  Normal cognitive status assessed by direct observation by this Nurse Health Advisor. No abnormalities found.         Immunizations Immunization History  Administered Date(s) Administered   Influenza Split 12/13/2011, 01/16/2013   Influenza Whole 01/23/2007   Influenza, High Dose Seasonal PF 11/11/2013, 12/03/2014, 12/08/2016, 12/02/2017, 11/30/2018, 11/30/2018   Influenza,inj,quad, With Preservative 12/05/2016   Influenza-Unspecified 12/08/2016, 11/05/2017, 11/06/2018   Moderna Sars-Covid-2 Vaccination 04/09/2019, 05/09/2019, 12/30/2019, 07/01/2020   Pneumococcal Conjugate-13 07/07/2014   Pneumococcal Polysaccharide-23 06/20/2013   Td 02/25/2016   Tdap 02/05/2016   Zoster Recombinat (Shingrix) 07/20/2016    TDAP status: Up to date  Flu Vaccine status: Up to date  Pneumococcal vaccine status: Up to date  Covid-19 vaccine status: Completed vaccines  Qualifies for Shingles Vaccine? Yes   Zostavax completed Yes   Shingrix Completed?: Yes  Screening Tests Health Maintenance  Topic Date Due   Zoster Vaccines- Shingrix (2 of 2) 09/14/2016   Fecal DNA (Cologuard)  07/29/2019   INFLUENZA VACCINE  10/05/2020   COVID-19 Vaccine (5 - Booster for Moderna series) 10/31/2020   MAMMOGRAM  09/16/2022   TETANUS/TDAP  02/24/2026   DEXA SCAN  Completed   Hepatitis C Screening  Completed   PNA vac Low Risk Adult  Completed   HPV VACCINES  Aged Out    Health Maintenance  Health Maintenance Due  Topic Date Due   Zoster Vaccines- Shingrix (2 of 2) 09/14/2016   Fecal DNA (Cologuard)  07/29/2019   INFLUENZA VACCINE  10/05/2020   COVID-19 Vaccine (5 - Booster for Moderna series) 10/31/2020    Colorectal cancer screening: Type of screening: Colonoscopy. Completed 08/26/2016. Repeat every 10 years  Mammogram status: Completed 09/15/2020. Repeat every year  Bone Density status: Completed 09/15/2020. Results reflect: Bone density results: OSTEOPENIA. Repeat every 5 years.  Lung Cancer Screening: (Low Dose CT Chest recommended if Age 22-80 years, 30 pack-year currently smoking OR have quit w/in  15years.) does not qualify.   Lung Cancer Screening Referral: n/a  Additional Screening:  Hepatitis C Screening: does not qualify; Completed 07/15/2016  Vision Screening: Recommended annual ophthalmology exams for early detection of glaucoma  and other disorders of the eye. Is the patient up to date with their annual eye exam?  Yes  Who is the provider or what is the name of the office in which the patient attends annual eye exams? [facility]  If pt is not established with a provider, would they like to be referred to a provider to establish care? Yes .   Dental Screening: Recommended annual dental exams for proper oral hygiene  Community Resource Referral / Chronic Care Management: CRR required this visit?  Yes   CCM required this visit?  Yes      Plan:     I have personally reviewed and noted the following in the patient's chart:   Medical and social history Use of alcohol, tobacco or illicit drugs  Current medications and supplements including opioid prescriptions.  Functional ability and status Nutritional status Physical activity Advanced directives List of other physicians Hospitalizations, surgeries, and ER visits in previous 12 months Vitals Screenings to include cognitive, depression, and falls Referrals and appointments  In addition, I have reviewed and discussed with patient certain preventive protocols, quality metrics, and best practice recommendations. A written personalized care plan for preventive services as well as general preventive health recommendations were provided to patient.     Randel Pigg, LPN   [id number]   Nurse Notes: none

## 2020-11-06 ENCOUNTER — Encounter: Payer: Self-pay | Admitting: Family Medicine

## 2020-11-06 DIAGNOSIS — M8580 Other specified disorders of bone density and structure, unspecified site: Secondary | ICD-10-CM | POA: Insufficient documentation

## 2020-11-12 ENCOUNTER — Other Ambulatory Visit: Payer: Self-pay | Admitting: Hematology & Oncology

## 2020-11-12 DIAGNOSIS — Z853 Personal history of malignant neoplasm of breast: Secondary | ICD-10-CM

## 2020-11-23 ENCOUNTER — Telehealth: Payer: Self-pay | Admitting: *Deleted

## 2020-11-23 NOTE — Telephone Encounter (Signed)
Pt no showed last AWv and is not interested in making an appt for another one.

## 2020-11-26 ENCOUNTER — Other Ambulatory Visit: Payer: Self-pay

## 2020-11-26 ENCOUNTER — Ambulatory Visit (INDEPENDENT_AMBULATORY_CARE_PROVIDER_SITE_OTHER): Payer: Medicare PPO | Admitting: *Deleted

## 2020-11-26 DIAGNOSIS — Z5181 Encounter for therapeutic drug level monitoring: Secondary | ICD-10-CM

## 2020-11-26 DIAGNOSIS — Z952 Presence of prosthetic heart valve: Secondary | ICD-10-CM

## 2020-11-26 LAB — POCT INR: INR: 1.8 — AB (ref 2.0–3.0)

## 2020-11-26 NOTE — Patient Instructions (Addendum)
Description   Today take 1.5 tablets then start taking warfarin 1/2 tablet daily except 1 tablet on Sundays, Tuesdays and Thursdays. Recheck in 2 weeks. Coumadin Clinic [phone]

## 2020-12-05 ENCOUNTER — Other Ambulatory Visit: Payer: Self-pay | Admitting: Cardiovascular Disease

## 2020-12-09 ENCOUNTER — Ambulatory Visit (HOSPITAL_COMMUNITY): Payer: Medicare PPO | Attending: Cardiovascular Disease

## 2020-12-09 ENCOUNTER — Ambulatory Visit (INDEPENDENT_AMBULATORY_CARE_PROVIDER_SITE_OTHER): Payer: Medicare PPO

## 2020-12-09 ENCOUNTER — Other Ambulatory Visit: Payer: Self-pay

## 2020-12-09 ENCOUNTER — Telehealth: Payer: Self-pay | Admitting: Cardiovascular Disease

## 2020-12-09 DIAGNOSIS — Z5181 Encounter for therapeutic drug level monitoring: Secondary | ICD-10-CM

## 2020-12-09 DIAGNOSIS — I5042 Chronic combined systolic (congestive) and diastolic (congestive) heart failure: Secondary | ICD-10-CM | POA: Insufficient documentation

## 2020-12-09 LAB — POCT INR: INR: 2.6 (ref 2.0–3.0)

## 2020-12-09 LAB — ECHOCARDIOGRAM COMPLETE
AV Mean grad: 15.7 mmHg
AV Peak grad: 27.2 mmHg
Ao pk vel: 2.61 m/s
Area-P 1/2: 2.22 cm2
P 1/2 time: 360 msec
S' Lateral: 2.6 cm

## 2020-12-09 MED ORDER — PERFLUTREN LIPID MICROSPHERE
1.0000 mL | INTRAVENOUS | Status: AC | PRN
  Administered 2020-12-09: 1 mL via INTRAVENOUS

## 2020-12-09 NOTE — Telephone Encounter (Signed)
Patient is returning call to discuss echo results. °

## 2020-12-09 NOTE — Patient Instructions (Signed)
Description   Continue taking warfarin 1/2 tablet daily except 1 tablet on Sundays, Tuesdays and Thursdays. Recheck in 3 weeks. Coumadin Clinic [phone]

## 2020-12-09 NOTE — Telephone Encounter (Signed)
Echo results reviewed with patient who verbalized understanding. Questions were answered to patient's satisfaction. She was concerned about the report of moderate stenosis of her mechanical AV. I reviewed the findings in relation to past echos and patient thanked me for the call.

## 2020-12-13 ENCOUNTER — Other Ambulatory Visit: Payer: Self-pay | Admitting: Cardiovascular Disease

## 2020-12-13 ENCOUNTER — Other Ambulatory Visit: Payer: Self-pay | Admitting: Hematology & Oncology

## 2020-12-13 DIAGNOSIS — Z853 Personal history of malignant neoplasm of breast: Secondary | ICD-10-CM

## 2020-12-14 ENCOUNTER — Other Ambulatory Visit: Payer: Self-pay | Admitting: *Deleted

## 2020-12-14 ENCOUNTER — Encounter: Payer: Self-pay | Admitting: Family

## 2020-12-14 DIAGNOSIS — Z853 Personal history of malignant neoplasm of breast: Secondary | ICD-10-CM

## 2020-12-14 MED ORDER — TEMAZEPAM 30 MG PO CAPS: ORAL_CAPSULE | ORAL | 0 refills | Status: DC

## 2020-12-15 MED ORDER — ENTRESTO 24-26 MG PO TABS: 1.0000 | ORAL_TABLET | Freq: Two times a day (BID) | ORAL | 2 refills | Status: DC

## 2020-12-17 DIAGNOSIS — H6123 Impacted cerumen, bilateral: Secondary | ICD-10-CM | POA: Diagnosis not present

## 2020-12-30 ENCOUNTER — Other Ambulatory Visit: Payer: Self-pay

## 2020-12-30 ENCOUNTER — Ambulatory Visit (INDEPENDENT_AMBULATORY_CARE_PROVIDER_SITE_OTHER): Payer: Medicare PPO

## 2020-12-30 DIAGNOSIS — Z5181 Encounter for therapeutic drug level monitoring: Secondary | ICD-10-CM

## 2020-12-30 LAB — POCT INR: INR: 2.5 (ref 2.0–3.0)

## 2020-12-30 NOTE — Patient Instructions (Signed)
Description   Continue taking warfarin 1/2 tablet daily except 1 tablet on Sundays, Tuesdays and Thursdays. Recheck in 4 weeks. Coumadin Clinic [phone]

## 2021-01-04 ENCOUNTER — Other Ambulatory Visit: Payer: Self-pay | Admitting: Cardiovascular Disease

## 2021-01-04 ENCOUNTER — Encounter: Payer: Self-pay | Admitting: Cardiovascular Disease

## 2021-01-04 NOTE — Progress Notes (Signed)
Amanda Wiggins Date of Birth  06-21-1945 [facility] [address]    [place], [place]  [address] [phone]  Fax  [phone]  Problem List 1. Aortic valve replacement 2. Hyperlipidemia 3. Breast cancer 4. CAD -  Mid LAD to Dist LAD lesion, 100% stenosed. Post intervention with a single Synergy DES 2.25 mm x 20 mm S/p mini vision stent to distal LAD   Amanda Wiggins is a 75 year old female with a history of aortic stenosis-status post aortic valve replacement.  She also has a history of hypercholesterolemia.  She's not had any episodes of chest pain or shortness of breath.  She's had her INR levels checked in our Coumadin clinic. Her INR levels have all been therapeutic.  Nov. 6 , 2014  Amanda Wiggins is doing well.  i saw her a year ago.  She is 10 years our from her breast cancer and is doing well.    No cardiac complaints.   Able to do all of her normal activities.     Nov. 13, 2015:  Amanda Wiggins is doing well.   No CP .  No dyspnea.   Has been stressful.  Her mother died this past year.   Her INR levels have been  Well controlled   Nov. 14, 2016:  Doing well.  No cardiac issues.  INR levels have been stable,  A bit low the last time .  Had an AVR 13 years ago .    June 05, 2015:  Amanda Wiggins was recently seen at the hospital for an acute anterior wall ST segment elevation myocardial infarction. She had stenting of her mid LAD. She had recurrent pain on the day of her original discharge and was taken back to the Cath Lab and had a second stent placed. She continued to have some intermittent episodes of chest discomfort. She has had persistent ST segment elevation. Her left ventricular systolic function is moderately depressed with an EF of 40-45%.   She has akinesis of the distal anterior wall and apex.   She is not having the band like chest pain that she had on presentation Has had some GERD.   Asked about going back on Omeprazole  - we discussed her need for Protonix with the  plavix .   September 01, 2015:  Amanda Wiggins is seen back for follow up visit. Still having trouble getting over her MI .  Still fatigued but is slowly getting better.   No CP.      Dec. 12, 2017:  feeling ok Does not have the energy that she wants ( and used to have prior to her MI )  BP has been in the 90-100 range.   September 21, 2016:    Amanda Wiggins is seen today for her AVR and CAD / MI  Her husband, Amanda Wiggins, died last week  Has some leg aches.   Feb. 13, 2019: Amanda Wiggins is seen back today for follow up of her AVR and CHF Echo shows LV EF of 40% , mean AV gradient is 21 mmHg.  Is on coumadin and plavix  Has occasional left shoulder pain  Had stenting of LAD in March 2017.    Has had some balance issues.   Saw ENT. Thinks it may be orthostatic hypotension  Had ears cleaned out.   Feels better  Aug. 26, 2019:  Doing well. Has CAD, AVR   No recent CP , Gets winded with little exertion EF is 40%.    Bruises quite a bit -  is on coumadin and plavix   Feb. 24, 2020:  Seen for follow up of her CAD and AVR. Continues to have balance issues.   Lists to one side. She has been going to PT .   Was thought to be due to muscle weakness possibly related to the crestor.   She is on Pralulent  And her balance and muscle strength is better.  Works out at [employer] and is doing yoga   January, 2021:  Amanda Wiggins is seen today for a follow-up visit.  She was in the emergency room last week with chest pain. The pain was described as an achiness located under both breast.  It did been there for a week.  Pain seems to be different than her angina equivalent.  She had -2 - troponins while in the emergency room and was discharged in satisfactory condition.  The pain settled in her left shoulder blade .  No dyspnea, no pleuretic cp   Sept. 2, 2021  Amanda Wiggins is seen today for follow up of her AVR, CAD, CHF Recent echo revealed reduced LV function with EF 30-35% We brought her back to start Entresto but her BP was too low to  start Entresto.   Was started on Losartan instead.  Was started on Lasix 20 mg 3 times a weeks.    Is avoiding salt and salty foods.   January 10, 2020: Amanda Wiggins is seen today for follow-up visit.  She has a history of aortic valve replacement, coronary artery disease, congestive heart failure.  Her echocardiogram shows an ejection fraction of 30 to 35%.  We have started her on losartan and lasix   She has been changed to [facility] .  Is off lasix .   Feeling better on the [facility]   Has had some left arm / neck pain   Jul 09, 2020:  Amanda Wiggins is seen today for follow up of her AVR, CAD, CHF Is on Entresto . Seems to be tolerating it well Was falling  But with PT she is getting stronger and has not fallen in a while  Is going to the gym regularly ,   INR is 1.9 today  Is tolerating the low dose entresto BP is marginal but no orthostasis   Nov. 1, 2022:  Amanda Wiggins is seen today for follow up of her AVR, CAD, CHF On entresto, LV function has not changed much - 30-35% Grade 1 DD  Mean AV gradient of 17 mmhg     Is back in the gym. Walks outside also  Has some DOE with exercise      Current Outpatient Medications on File Prior to Visit  Medication Sig Dispense Refill   amoxicillin (AMOXIL) 500 MG capsule For dental work     BIOTIN PO Take 1 tablet by mouth every morning.     Calcium Carbonate-Vitamin D 600-400 MG-UNIT tablet Take 1 tablet by mouth 2 (two) times daily.     carvedilol (COREG) 3.125 MG tablet Take 1 tablet (3.125 mg total) by mouth 2 (two) times daily with a meal. 180 tablet 3   clopidogrel (PLAVIX) 75 MG tablet TAKE 1 TABLET (75 MG TOTAL) BY MOUTH DAILY WITH BREAKFAST. 90 tablet 3   cycloSPORINE (RESTASIS) 0.05 % ophthalmic emulsion Place 1 drop into both eyes 2 (two) times daily as needed (DRY EYE).     Desoximetasone 0.05 % GEL APPLY TO AFFECTED AREA ONCE TO 2 TIMES DAILY 45 g 6   Glucosamine-Chondroit-Vit C-Mn (GLUCOSAMINE CHONDR 1500 COMPLX)  CAPS Take 1 capsule by mouth  every morning.      loratadine (CLARITIN) 10 MG tablet Take 10 mg by mouth daily.     Multiple Vitamin (MULTIVITAMIN) capsule Take 1 capsule by mouth daily.     nitroGLYCERIN (NITROSTAT) 0.4 MG SL tablet Place 1 tablet (0.4 mg total) under the tongue every 5 (five) minutes as needed for chest pain. 25 tablet 6   pantoprazole (PROTONIX) 40 MG tablet TAKE 1 TABLET BY MOUTH EVERY DAY 90 tablet 2   PRALUENT 75 MG/ML SOAJ INJECT 1 PEN INTO THE SKIN EVERY 14 (FOURTEEN) DAYS. 2 mL 11   sacubitril-valsartan (ENTRESTO) 24-26 MG Take 1 tablet by mouth 2 (two) times daily. 180 tablet 2   temazepam (RESTORIL) 30 MG capsule TAKE 1 CAPSULE BY MOUTH AT BEDTIME AS NEEDED FOR SLEEP 30 capsule 0   warfarin (COUMADIN) 5 MG tablet TAKE AS DIRECTED BY COUMADIN CLINIC 90 tablet 1   No current facility-administered medications on file prior to visit.    Allergies  Allergen Reactions   Latex Hives   Meperidine Nausea Only   Sulfa Antibiotics Nausea Only   Crestor [Rosuvastatin Calcium] Other (See Comments)    Myalgias on 10mg  and 20mg  daily   Pravastatin Other (See Comments)    myalgias   Sulfamethoxazole Nausea And Vomiting   Repatha [Evolocumab] Other (See Comments)    myalgias    Past Medical History:  Diagnosis Date   Aortic valve replaced    Breast cancer ([place]) 2005   FHx: migraine headaches    GERD (gastroesophageal reflux disease)    Hiatal hernia    Hx Breast Cancer, IDC, Stage I 07/03/2003   Hx of breast cancer    Hyperlipidemia    Hypertension    Menopausal syndrome    Osteopenia    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 08/22/2011   Personal history of radiation therapy    Status post aortic valve repair     Past Surgical History:  Procedure Laterality Date   AORTIC VALVE REPLACEMENT  2003   BREAST BIOPSY Left 2005   BREAST LUMPECTOMY Left 2005   CARDIAC CATHETERIZATION     Ejection Fraction    CARDIAC CATHETERIZATION N/A 05/16/2015   Procedure: Left Heart Cath and Coronary  Angiography;  Surgeon: Leonie Man, MD;  Location: [place] [facility];  Service: Cardiovascular;  Laterality: N/A;   CARDIAC CATHETERIZATION  05/16/2015   Procedure: Coronary Stent Intervention;  Surgeon: Leonie Man, MD;  Location: [place] [facility];  Service: Cardiovascular;;   CARDIAC CATHETERIZATION N/A 05/19/2015   Procedure: Left Heart Cath and Coronary Angiography;  Surgeon: Troy Sine, MD;  Location: [place] [facility];  Service: Cardiovascular;  Laterality: N/A;   CARDIAC CATHETERIZATION N/A 05/19/2015   Procedure: Coronary Stent Intervention;  Surgeon: Troy Sine, MD;  Location: [place] [facility];  Service: Cardiovascular;  Laterality: N/A;   HIATAL HERNIA REPAIR     HIATAL HERNIA REPAIR  05/11/2007   hysterectomy, endometiral cancer  2001   lummpectomy breast cancer  [id number]   nissan fundoplicaiton  0017   post-op hematoma on heparin    right shouler surgery      Social History   Tobacco Use  Smoking Status Never  Smokeless Tobacco Never    Social History   Substance and Sexual Activity  Alcohol Use Yes   Alcohol/week: 0.0 standard drinks   Comment: socially    Family History  Problem Relation Age of Onset  Heart attack Mother 64   Osteoarthritis Brother        hpercholesterolemia    Reviw of Systems:  Noted in current history, otherwise review of systems is negative.  Physical Exam: Blood pressure 128/64, pulse 65, height 5\' 1"  (1.549 m), weight 140 lb (63.5 kg), SpO2 97 %.  GEN:  Well nourished, well developed in no acute distress HEENT: Normal NECK: No JVD; No carotid bruits LYMPHATICS: No lymphadenopathy CARDIAC: RRR , mechanical S2,  soft systolic murmur  RESPIRATORY:  Clear to auscultation without rales, wheezing or rhonchi  ABDOMEN: Soft, non-tender, non-distended MUSCULOSKELETAL:  No edema; No deformity  SKIN: Warm and dry NEUROLOGIC:  Alert and oriented x 3    ECG:  Nov. 1, 2022:  NSR , RBBB , old ANt MI, rare PVCs      Assessment / Plan:   1. Aortic valve replacement -       Seen in our coumadin clinic    2. Hyperlipidemia -      Has reduced her carbs and sweets  Has lost about 8 pounds   3.  Chronic systolic/ diastolic congestive heart failure: She is now on carvedilol, Entresto 24-26 twice a day.    No worsening of her CHF symptoms  Cont current meds.    4. CAD  -   no angina  Seems to stable     Mertie Moores, MD  01/05/2021 9:44 AM    [facility] [place],  [place], [place]  [address] Pager [phone] Phone: [phone]; Fax: [phone]

## 2021-01-05 ENCOUNTER — Ambulatory Visit: Payer: Medicare PPO | Admitting: Cardiovascular Disease

## 2021-01-05 ENCOUNTER — Encounter: Payer: Self-pay | Admitting: Cardiovascular Disease

## 2021-01-05 ENCOUNTER — Other Ambulatory Visit: Payer: Self-pay

## 2021-01-05 VITALS — BP 128/64 | HR 65 | Ht 61.0 in | Wt 140.0 lb

## 2021-01-05 DIAGNOSIS — Z9861 Coronary angioplasty status: Secondary | ICD-10-CM | POA: Diagnosis not present

## 2021-01-05 DIAGNOSIS — I2510 Atherosclerotic heart disease of native coronary artery without angina pectoris: Secondary | ICD-10-CM

## 2021-01-05 DIAGNOSIS — I5043 Acute on chronic combined systolic (congestive) and diastolic (congestive) heart failure: Secondary | ICD-10-CM | POA: Diagnosis not present

## 2021-01-05 DIAGNOSIS — E785 Hyperlipidemia, unspecified: Secondary | ICD-10-CM

## 2021-01-05 NOTE — Patient Instructions (Signed)
Medication Instructions:  Your physician recommends that you continue on your current medications as directed. Please refer to the Current Medication list given to you today.  *If you need a refill on your cardiac medications before your next appointment, please call your pharmacy*   Lab Work: None If you have labs (blood work) drawn today and your tests are completely normal, you will receive your results only by: . MyChart Message (if you have MyChart) OR . A paper copy in the mail If you have any lab test that is abnormal or we need to change your treatment, we will call you to review the results.   Testing/Procedures: None   Follow-Up: At [facility], you and your health needs are our priority.  As part of our continuing mission to provide you with exceptional heart care, we have created designated Provider Care Teams.  These Care Teams include your primary Cardiologist (physician) and Advanced Practice Providers (APPs -  Physician Assistants and Nurse Practitioners) who all work together to provide you with the care you need, when you need it.  We recommend signing up for the patient portal called "MyChart".  Sign up information is provided on this After Visit Summary.  MyChart is used to connect with patients for Virtual Visits (Telemedicine).  Patients are able to view lab/test results, encounter notes, upcoming appointments, etc.  Non-urgent messages can be sent to your provider as well.   To learn more about what you can do with MyChart, go to [email or web address].[email or web address].    Your next appointment:   1 year(s)  The format for your next appointment:   In Person  Provider:   You may see Philip Nahser, MD or one of the following Advanced Practice Providers on your designated Care Team:    Scott Weaver, PA-C  Vin Bhagat, PA-C   Other Instructions   

## 2021-01-07 NOTE — Addendum Note (Signed)
Addended by: Lynn Ito on: 01/07/2021 10:52 AM   Modules accepted: Orders

## 2021-01-12 ENCOUNTER — Other Ambulatory Visit: Payer: Self-pay | Admitting: Hematology & Oncology

## 2021-01-12 DIAGNOSIS — Z853 Personal history of malignant neoplasm of breast: Secondary | ICD-10-CM

## 2021-01-26 ENCOUNTER — Ambulatory Visit (INDEPENDENT_AMBULATORY_CARE_PROVIDER_SITE_OTHER): Payer: Medicare PPO

## 2021-01-26 ENCOUNTER — Other Ambulatory Visit: Payer: Self-pay

## 2021-01-26 DIAGNOSIS — Z5181 Encounter for therapeutic drug level monitoring: Secondary | ICD-10-CM

## 2021-01-26 DIAGNOSIS — H52223 Regular astigmatism, bilateral: Secondary | ICD-10-CM | POA: Diagnosis not present

## 2021-01-26 DIAGNOSIS — Z952 Presence of prosthetic heart valve: Secondary | ICD-10-CM | POA: Diagnosis not present

## 2021-01-26 DIAGNOSIS — H25013 Cortical age-related cataract, bilateral: Secondary | ICD-10-CM | POA: Diagnosis not present

## 2021-01-26 DIAGNOSIS — H5213 Myopia, bilateral: Secondary | ICD-10-CM | POA: Diagnosis not present

## 2021-01-26 DIAGNOSIS — H524 Presbyopia: Secondary | ICD-10-CM | POA: Diagnosis not present

## 2021-01-26 DIAGNOSIS — H31002 Unspecified chorioretinal scars, left eye: Secondary | ICD-10-CM | POA: Diagnosis not present

## 2021-01-26 LAB — POCT INR: INR: 3 (ref 2.0–3.0)

## 2021-01-26 NOTE — Patient Instructions (Signed)
Description   Continue taking warfarin 1/2 tablet daily except 1 tablet on Sundays, Tuesdays and Thursdays. Recheck in 4 weeks. [facility] [phone]

## 2021-02-11 ENCOUNTER — Other Ambulatory Visit: Payer: Self-pay | Admitting: Hematology & Oncology

## 2021-02-11 DIAGNOSIS — Z853 Personal history of malignant neoplasm of breast: Secondary | ICD-10-CM

## 2021-02-12 ENCOUNTER — Encounter: Payer: Self-pay | Admitting: Family

## 2021-02-12 NOTE — Telephone Encounter (Signed)
Refill request for Temazepam #30 with 0 refills. Last refilled 01/12/21 # 30. Last OV 08/12/20 - next 08/12/21. Please advise if ok to refill, thanks!

## 2021-02-24 ENCOUNTER — Ambulatory Visit (INDEPENDENT_AMBULATORY_CARE_PROVIDER_SITE_OTHER): Payer: Medicare PPO

## 2021-02-24 ENCOUNTER — Other Ambulatory Visit: Payer: Self-pay

## 2021-02-24 DIAGNOSIS — Z952 Presence of prosthetic heart valve: Secondary | ICD-10-CM | POA: Diagnosis not present

## 2021-02-24 DIAGNOSIS — Z5181 Encounter for therapeutic drug level monitoring: Secondary | ICD-10-CM

## 2021-02-24 LAB — POCT INR: INR: 2.2 (ref 2.0–3.0)

## 2021-02-24 NOTE — Patient Instructions (Signed)
Description   Continue taking warfarin 1/2 tablet daily except 1 tablet on Sundays, Tuesdays and Thursdays. Recheck in 4 weeks. [facility] [phone]

## 2021-03-13 ENCOUNTER — Other Ambulatory Visit: Payer: Self-pay | Admitting: Hematology & Oncology

## 2021-03-13 DIAGNOSIS — Z853 Personal history of malignant neoplasm of breast: Secondary | ICD-10-CM

## 2021-03-15 ENCOUNTER — Encounter: Payer: Self-pay | Admitting: Family

## 2021-03-23 ENCOUNTER — Other Ambulatory Visit: Payer: Self-pay

## 2021-03-23 ENCOUNTER — Ambulatory Visit (INDEPENDENT_AMBULATORY_CARE_PROVIDER_SITE_OTHER): Payer: Medicare PPO | Admitting: *Deleted

## 2021-03-23 DIAGNOSIS — Z5181 Encounter for therapeutic drug level monitoring: Secondary | ICD-10-CM | POA: Diagnosis not present

## 2021-03-23 DIAGNOSIS — Z952 Presence of prosthetic heart valve: Secondary | ICD-10-CM

## 2021-03-23 LAB — POCT INR: INR: 1.9 — AB (ref 2.0–3.0)

## 2021-03-23 NOTE — Patient Instructions (Signed)
Description   Since you took an extra 1/2 tablet last night, continue taking warfarin 1/2 tablet daily except 1 tablet on Sundays, Tuesdays and Thursdays. Recheck in 3 weeks. [facility] [phone]

## 2021-04-02 ENCOUNTER — Other Ambulatory Visit: Payer: Self-pay | Admitting: Cardiovascular Disease

## 2021-04-06 ENCOUNTER — Other Ambulatory Visit: Payer: Self-pay | Admitting: Cardiovascular Disease

## 2021-04-08 ENCOUNTER — Telehealth: Payer: Self-pay | Admitting: Pharmacist

## 2021-04-08 NOTE — Telephone Encounter (Signed)
Pt called in asking for Praluent form to be filled out ASAP. She already has active prior auth on file, looks to be tier exception request. These are usually not approved for branded medications but has been submitted.

## 2021-04-12 ENCOUNTER — Other Ambulatory Visit: Payer: Self-pay | Admitting: Hematology & Oncology

## 2021-04-12 DIAGNOSIS — Z853 Personal history of malignant neoplasm of breast: Secondary | ICD-10-CM

## 2021-04-13 ENCOUNTER — Ambulatory Visit (INDEPENDENT_AMBULATORY_CARE_PROVIDER_SITE_OTHER): Payer: Medicare PPO | Admitting: *Deleted

## 2021-04-13 ENCOUNTER — Other Ambulatory Visit: Payer: Self-pay

## 2021-04-13 DIAGNOSIS — Z5181 Encounter for therapeutic drug level monitoring: Secondary | ICD-10-CM

## 2021-04-13 DIAGNOSIS — Z952 Presence of prosthetic heart valve: Secondary | ICD-10-CM | POA: Diagnosis not present

## 2021-04-13 LAB — POCT INR: INR: 2.4 (ref 2.0–3.0)

## 2021-04-13 NOTE — Patient Instructions (Signed)
Description   Continue taking warfarin 1/2 tablet daily except 1 tablet on Sundays, Tuesdays and Thursdays. Recheck in 4 weeks. [facility] [phone]

## 2021-05-11 ENCOUNTER — Ambulatory Visit (INDEPENDENT_AMBULATORY_CARE_PROVIDER_SITE_OTHER): Payer: Medicare PPO | Admitting: *Deleted

## 2021-05-11 ENCOUNTER — Other Ambulatory Visit: Payer: Self-pay

## 2021-05-11 DIAGNOSIS — Z5181 Encounter for therapeutic drug level monitoring: Secondary | ICD-10-CM

## 2021-05-11 DIAGNOSIS — Z952 Presence of prosthetic heart valve: Secondary | ICD-10-CM

## 2021-05-11 LAB — POCT INR: INR: 2.9 (ref 2.0–3.0)

## 2021-05-11 NOTE — Patient Instructions (Signed)
Description   ?Continue taking warfarin 1/2 tablet daily except 1 tablet on Sundays, Tuesdays and Thursdays. Recheck in 5 weeks. [facility] [phone] ?  ?  ?

## 2021-05-13 ENCOUNTER — Other Ambulatory Visit: Payer: Self-pay | Admitting: Hematology & Oncology

## 2021-05-13 DIAGNOSIS — Z853 Personal history of malignant neoplasm of breast: Secondary | ICD-10-CM

## 2021-05-14 ENCOUNTER — Encounter: Payer: Self-pay | Admitting: Family

## 2021-06-02 ENCOUNTER — Other Ambulatory Visit: Payer: Self-pay | Admitting: Cardiovascular Disease

## 2021-06-02 DIAGNOSIS — M542 Cervicalgia: Secondary | ICD-10-CM | POA: Diagnosis not present

## 2021-06-02 DIAGNOSIS — Z7901 Long term (current) use of anticoagulants: Secondary | ICD-10-CM

## 2021-06-02 DIAGNOSIS — Z952 Presence of prosthetic heart valve: Secondary | ICD-10-CM

## 2021-06-04 ENCOUNTER — Encounter: Payer: Self-pay | Admitting: Family

## 2021-06-04 ENCOUNTER — Ambulatory Visit (INDEPENDENT_AMBULATORY_CARE_PROVIDER_SITE_OTHER): Payer: Medicare PPO | Admitting: *Deleted

## 2021-06-04 DIAGNOSIS — Z5181 Encounter for therapeutic drug level monitoring: Secondary | ICD-10-CM | POA: Diagnosis not present

## 2021-06-04 DIAGNOSIS — Z952 Presence of prosthetic heart valve: Secondary | ICD-10-CM | POA: Diagnosis not present

## 2021-06-04 LAB — POCT INR: INR: 2.9 (ref 2.0–3.0)

## 2021-06-04 NOTE — Patient Instructions (Signed)
Description   ?Continue taking warfarin 1/2 tablet daily except 1 tablet on Sundays, Tuesdays and Thursdays. Recheck in 4 weeks. [facility] [phone] ?  ?  ?

## 2021-06-13 ENCOUNTER — Other Ambulatory Visit: Payer: Self-pay | Admitting: Hematology & Oncology

## 2021-06-13 DIAGNOSIS — Z853 Personal history of malignant neoplasm of breast: Secondary | ICD-10-CM

## 2021-06-14 ENCOUNTER — Encounter: Payer: Self-pay | Admitting: Family

## 2021-06-29 DIAGNOSIS — G4700 Insomnia, unspecified: Secondary | ICD-10-CM | POA: Diagnosis not present

## 2021-06-29 DIAGNOSIS — E785 Hyperlipidemia, unspecified: Secondary | ICD-10-CM | POA: Diagnosis not present

## 2021-06-29 DIAGNOSIS — M1990 Unspecified osteoarthritis, unspecified site: Secondary | ICD-10-CM | POA: Diagnosis not present

## 2021-06-29 DIAGNOSIS — I509 Heart failure, unspecified: Secondary | ICD-10-CM | POA: Diagnosis not present

## 2021-06-29 DIAGNOSIS — R079 Chest pain, unspecified: Secondary | ICD-10-CM | POA: Diagnosis not present

## 2021-06-29 DIAGNOSIS — I252 Old myocardial infarction: Secondary | ICD-10-CM | POA: Diagnosis not present

## 2021-06-29 DIAGNOSIS — I739 Peripheral vascular disease, unspecified: Secondary | ICD-10-CM | POA: Diagnosis not present

## 2021-06-29 DIAGNOSIS — K219 Gastro-esophageal reflux disease without esophagitis: Secondary | ICD-10-CM | POA: Diagnosis not present

## 2021-06-29 DIAGNOSIS — I110 Hypertensive heart disease with heart failure: Secondary | ICD-10-CM | POA: Diagnosis not present

## 2021-06-30 ENCOUNTER — Ambulatory Visit (INDEPENDENT_AMBULATORY_CARE_PROVIDER_SITE_OTHER): Payer: Medicare PPO | Admitting: *Deleted

## 2021-06-30 DIAGNOSIS — Z952 Presence of prosthetic heart valve: Secondary | ICD-10-CM | POA: Diagnosis not present

## 2021-06-30 DIAGNOSIS — Z5181 Encounter for therapeutic drug level monitoring: Secondary | ICD-10-CM

## 2021-06-30 DIAGNOSIS — M542 Cervicalgia: Secondary | ICD-10-CM | POA: Diagnosis not present

## 2021-06-30 LAB — POCT INR: INR: 2.2 (ref 2.0–3.0)

## 2021-06-30 NOTE — Patient Instructions (Signed)
Description   ?Continue taking warfarin 1/2 tablet daily except 1 tablet on Sundays, Tuesdays and Thursdays. Recheck in 5 weeks. [facility] [phone] ?  ?  ?

## 2021-07-15 ENCOUNTER — Other Ambulatory Visit: Payer: Self-pay | Admitting: Hematology & Oncology

## 2021-07-15 DIAGNOSIS — Z853 Personal history of malignant neoplasm of breast: Secondary | ICD-10-CM

## 2021-07-16 ENCOUNTER — Encounter: Payer: Self-pay | Admitting: Family

## 2021-08-03 ENCOUNTER — Ambulatory Visit (INDEPENDENT_AMBULATORY_CARE_PROVIDER_SITE_OTHER): Payer: Medicare PPO | Admitting: *Deleted

## 2021-08-03 DIAGNOSIS — Z5181 Encounter for therapeutic drug level monitoring: Secondary | ICD-10-CM

## 2021-08-03 LAB — POCT INR: INR: 2.3 (ref 2.0–3.0)

## 2021-08-03 NOTE — Patient Instructions (Signed)
Description   Continue taking warfarin 1/2 tablet daily except 1 tablet on Sundays, Tuesdays and Thursdays. Recheck in 6 weeks. [facility] [phone]     

## 2021-08-04 ENCOUNTER — Other Ambulatory Visit: Payer: Self-pay | Admitting: Hematology & Oncology

## 2021-08-04 DIAGNOSIS — Z853 Personal history of malignant neoplasm of breast: Secondary | ICD-10-CM

## 2021-08-05 ENCOUNTER — Other Ambulatory Visit: Payer: Self-pay | Admitting: Obstetrics

## 2021-08-05 DIAGNOSIS — Z1231 Encounter for screening mammogram for malignant neoplasm of breast: Secondary | ICD-10-CM

## 2021-08-12 ENCOUNTER — Encounter: Payer: Self-pay | Admitting: Hematology & Oncology

## 2021-08-12 ENCOUNTER — Inpatient Hospital Stay: Payer: Medicare PPO | Admitting: Hematology & Oncology

## 2021-08-12 ENCOUNTER — Inpatient Hospital Stay: Payer: Medicare PPO

## 2021-08-12 ENCOUNTER — Other Ambulatory Visit: Payer: Self-pay

## 2021-08-12 ENCOUNTER — Inpatient Hospital Stay: Payer: Medicare PPO | Attending: Hematology & Oncology

## 2021-08-12 ENCOUNTER — Other Ambulatory Visit: Payer: Self-pay | Admitting: Lab

## 2021-08-12 VITALS — BP 110/62 | HR 62 | Temp 98.1°F | Resp 18 | Ht 61.0 in | Wt 131.0 lb

## 2021-08-12 DIAGNOSIS — M818 Other osteoporosis without current pathological fracture: Secondary | ICD-10-CM

## 2021-08-12 DIAGNOSIS — Z853 Personal history of malignant neoplasm of breast: Secondary | ICD-10-CM | POA: Insufficient documentation

## 2021-08-12 DIAGNOSIS — Z79899 Other long term (current) drug therapy: Secondary | ICD-10-CM | POA: Insufficient documentation

## 2021-08-12 DIAGNOSIS — Z885 Allergy status to narcotic agent status: Secondary | ICD-10-CM | POA: Diagnosis not present

## 2021-08-12 DIAGNOSIS — Z882 Allergy status to sulfonamides status: Secondary | ICD-10-CM | POA: Insufficient documentation

## 2021-08-12 DIAGNOSIS — Z923 Personal history of irradiation: Secondary | ICD-10-CM | POA: Insufficient documentation

## 2021-08-12 DIAGNOSIS — Z7901 Long term (current) use of anticoagulants: Secondary | ICD-10-CM | POA: Insufficient documentation

## 2021-08-12 DIAGNOSIS — Z952 Presence of prosthetic heart valve: Secondary | ICD-10-CM | POA: Insufficient documentation

## 2021-08-12 LAB — CMP (CANCER CENTER ONLY)
ALT: 18 U/L (ref 0–44)
AST: 24 U/L (ref 15–41)
Albumin: 4.7 g/dL (ref 3.5–5.0)
Alkaline Phosphatase: 50 U/L (ref 38–126)
Anion gap: 9 (ref 5–15)
BUN: 19 mg/dL (ref 8–23)
CO2: 27 mmol/L (ref 22–32)
Calcium: 9.8 mg/dL (ref 8.9–10.3)
Chloride: 103 mmol/L (ref 98–111)
Creatinine: 0.99 mg/dL (ref 0.44–1.00)
GFR, Estimated: 60 mL/min — ABNORMAL LOW (ref >60)
Glucose, Bld: 109 mg/dL — ABNORMAL HIGH (ref 70–99)
Potassium: 3.8 mmol/L (ref 3.5–5.1)
Sodium: 139 mmol/L (ref 135–145)
Total Bilirubin: 0.6 mg/dL (ref 0.3–1.2)
Total Protein: 7.2 g/dL (ref 6.5–8.1)

## 2021-08-12 LAB — CBC WITH DIFFERENTIAL (CANCER CENTER ONLY)
Abs Immature Granulocytes: 0.03 10*3/uL (ref 0.00–0.07)
Basophils Absolute: 0 10*3/uL (ref 0.0–0.1)
Basophils Relative: 0 %
Eosinophils Absolute: 0.2 10*3/uL (ref 0.0–0.5)
Eosinophils Relative: 3 %
HCT: 46.7 % — ABNORMAL HIGH (ref 36.0–46.0)
Hemoglobin: 15.3 g/dL — ABNORMAL HIGH (ref 12.0–15.0)
Immature Granulocytes: 0 %
Lymphocytes Relative: 18 %
Lymphs Abs: 1.5 10*3/uL (ref 0.7–4.0)
MCH: 29.4 pg (ref 26.0–34.0)
MCHC: 32.8 g/dL (ref 30.0–36.0)
MCV: 89.8 fL (ref 80.0–100.0)
Monocytes Absolute: 0.6 10*3/uL (ref 0.1–1.0)
Monocytes Relative: 8 %
Neutro Abs: 5.7 10*3/uL (ref 1.7–7.7)
Neutrophils Relative %: 71 %
Platelet Count: 366 10*3/uL (ref 150–400)
RBC: 5.2 MIL/uL — ABNORMAL HIGH (ref 3.87–5.11)
RDW: 13.2 % (ref 11.5–15.5)
WBC Count: 8 10*3/uL (ref 4.0–10.5)
nRBC: 0 % (ref 0.0–0.2)

## 2021-08-12 MED ORDER — ZOLEDRONIC ACID 4 MG/100ML IV SOLN
4.0000 mg | Freq: Once | INTRAVENOUS | Status: AC
  Administered 2021-08-12: 4 mg via INTRAVENOUS
  Filled 2021-08-12: qty 100

## 2021-08-12 NOTE — Progress Notes (Signed)
Hematology and Oncology Follow Up Visit  Amanda Wiggins [id number] [birth year] 76 y.o. 08/12/2021   Principle Diagnosis:  1. Stage I (T1 N0 M0) ductal carcinoma of the left breast. 2. Mechanical aortic valve.  Current Therapy:    Coumadin-lifelong Zometa 5 mg IV q. Year - given in June 2023     Interim History:  Ms.  Amanda Wiggins is back for followup.  The big news is that she is heading over to [place] and [place] tomorrow.  She has a Psychiatrist who lives over in [place].  She will go to [place] first and then go to [place] and then back to [place].  She will be over in [place] for 10 days.  She is feels well.  She has had no problems with nausea or vomiting.  She is on Coumadin because of her mechanical heart valve.  There is been no bleeding.  She has had no problems with COVID.  She had a COVID vaccine a couple weeks ago.  She has had no issues with leg swelling.  She has had no change in bowel or bladder habits.  As always, she is doing pretty well.  She really has had no problems since we last saw her.  She is doing well with the Coumadin.  She has had no issues with respect to cardiac trouble.  She has had no problems with bowels or bladder.    Her last mammogram was back in July 2022.  She will have 1 when she returns from [place].  She has had no rashes.  Overall, her performance status is ECOG 1.    Medications:  Current Outpatient Medications:    Alirocumab (PRALUENT) 75 MG/ML SOAJ, INJECT 1 PEN INTO THE SKIN EVERY 14 (FOURTEEN) DAYS., Disp: 6 mL, Rfl: 3   amoxicillin (AMOXIL) 500 MG capsule, For dental work, Disp: , Rfl:    BIOTIN PO, Take 1 tablet by mouth every morning., Disp: , Rfl:    Calcium Carbonate-Vitamin D 600-400 MG-UNIT tablet, Take 1 tablet by mouth 2 (two) times daily., Disp: , Rfl:    carvedilol (COREG) 3.125 MG tablet, TAKE 1 TABLET (3.125 MG TOTAL) BY MOUTH 2 (TWO) TIMES DAILY WITH A MEAL., Disp: 180 tablet, Rfl: 3   clopidogrel (PLAVIX) 75 MG  tablet, TAKE 1 TABLET (75 MG TOTAL) BY MOUTH DAILY WITH BREAKFAST., Disp: 90 tablet, Rfl: 3   cycloSPORINE (RESTASIS) 0.05 % ophthalmic emulsion, Place 1 drop into both eyes 2 (two) times daily as needed (DRY EYE)., Disp: , Rfl:    Desoximetasone 0.05 % GEL, APPLY TO AFFECTED AREA ONCE TO 2 TIMES DAILY, Disp: 45 g, Rfl: 6   Glucosamine-Chondroit-Vit C-Mn (GLUCOSAMINE CHONDR 1500 COMPLX) CAPS, Take 1 capsule by mouth every morning. , Disp: , Rfl:    loratadine (CLARITIN) 10 MG tablet, Take 10 mg by mouth daily., Disp: , Rfl:    Multiple Vitamin (MULTIVITAMIN) capsule, Take 1 capsule by mouth daily., Disp: , Rfl:    pantoprazole (PROTONIX) 40 MG tablet, TAKE 1 TABLET BY MOUTH EVERY DAY, Disp: 90 tablet, Rfl: 2   sacubitril-valsartan (ENTRESTO) 24-26 MG, Take 1 tablet by mouth 2 (two) times daily., Disp: 180 tablet, Rfl: 2   temazepam (RESTORIL) 30 MG capsule, TAKE 1 CAPSULE BY MOUTH AT BEDTIME AS NEEDED FOR SLEEP, Disp: 30 capsule, Rfl: 0   warfarin (COUMADIN) 5 MG tablet, TAKE AS DIRECTED BY COUMADIN CLINIC, Disp: 80 tablet, Rfl: 1   nitroGLYCERIN (NITROSTAT) 0.4 MG SL tablet, Place 1 tablet (0.4 mg total)  under the tongue every 5 (five) minutes as needed for chest pain. (Patient not taking: Reported on 08/12/2021), Disp: 25 tablet, Rfl: 6  Allergies:  Allergies  Allergen Reactions   Latex Hives   Meperidine Nausea Only   Sulfa Antibiotics Nausea Only   Crestor [Rosuvastatin Calcium] Other (See Comments)    Myalgias on '10mg'$  and '20mg'$  daily   Pravastatin Other (See Comments)    myalgias   Sulfamethoxazole Nausea And Vomiting   Repatha [Evolocumab] Other (See Comments)    myalgias    Past Medical History, Surgical history, Social history, and Family History were reviewed and updated.  Review of Systems: Review of Systems  Constitutional: Negative.   HENT: Negative.    Eyes: Negative.   Cardiovascular: Negative.   Gastrointestinal: Negative.   Genitourinary: Negative.   Musculoskeletal:  Negative.   Skin: Negative.   Neurological: Negative.   Endo/Heme/Allergies: Negative.   Psychiatric/Behavioral: Negative.       Physical Exam:  height is '5\' 1"'$  (1.549 m) and weight is 131 lb (59.4 kg). Her oral temperature is 98.1 F (36.7 C). Her blood pressure is 110/62 and her pulse is 62. Her respiration is 18 and oxygen saturation is 100%.   Physical Exam Vitals reviewed.  HENT:     Head: Normocephalic and atraumatic.  Eyes:     Pupils: Pupils are equal, round, and reactive to light.  Cardiovascular:     Rate and Rhythm: Normal rate and regular rhythm.     Heart sounds: Normal heart sounds.  Pulmonary:     Effort: Pulmonary effort is normal.     Breath sounds: Normal breath sounds.  Abdominal:     General: Bowel sounds are normal.     Palpations: Abdomen is soft.  Musculoskeletal:        General: No tenderness or deformity. Normal range of motion.     Cervical back: Normal range of motion.  Lymphadenopathy:     Cervical: No cervical adenopathy.  Skin:    General: Skin is warm and dry.     Findings: No erythema or rash.  Neurological:     Mental Status: She is alert and oriented to person, place, and time.  Psychiatric:        Behavior: Behavior normal.        Thought Content: Thought content normal.        Judgment: Judgment normal.     Lab Results  Component Value Date   WBC 8.0 08/12/2021   HGB 15.3 (H) 08/12/2021   HCT 46.7 (H) 08/12/2021   MCV 89.8 08/12/2021   PLT 366 08/12/2021     Chemistry      Component Value Date/Time   NA 139 08/12/2021 0915   NA 141 07/09/2020 1152   NA 147 (H) 02/27/2017 0952   NA 142 02/12/2015 1125   K 3.8 08/12/2021 0915   K 4.3 02/27/2017 0952   K 3.8 02/12/2015 1125   CL 103 08/12/2021 0915   CL 106 02/27/2017 0952   CO2 27 08/12/2021 0915   CO2 30 02/27/2017 0952   CO2 24 02/12/2015 1125   BUN 19 08/12/2021 0915   BUN 24 07/09/2020 1152   BUN 14 02/27/2017 0952   BUN 14.7 02/12/2015 1125   CREATININE 0.99  08/12/2021 0915   CREATININE 0.9 02/27/2017 0952   CREATININE 1.0 02/12/2015 1125      Component Value Date/Time   CALCIUM 9.8 08/12/2021 0915   CALCIUM 9.2 02/27/2017 0952   CALCIUM 10.2  02/12/2015 1125   ALKPHOS 50 08/12/2021 0915   ALKPHOS 55 02/27/2017 0952   ALKPHOS 56 02/12/2015 1125   AST 24 08/12/2021 0915   AST 35 (H) 02/12/2015 1125   ALT 18 08/12/2021 0915   ALT 22 02/27/2017 0952   ALT 39 02/12/2015 1125   BILITOT 0.6 08/12/2021 0915   BILITOT 0.45 02/12/2015 1125       Impression and Plan: Ms. Mariani is 76 year old female with a history of stage I infiltrating duct carcinoma the left breast. She underwent lumpectomy.  She  had been on Femara. She had radiation. It has been 16 years now.  Everything looks good right now.  I do not see any problems from a malignant point of view.  I do not see any evidence of recurrent breast cancer.  I am sure she will have a wonderful time over in [place].  I know she will do a lot of walking.  I told her that she could even have an adult beverage while over in [place].  We will go ahead and plan to get her back in 12 months.    We will see her back, we will do her Zometa.  Volanda Napoleon, MD 6/8/202310:51 AM

## 2021-08-12 NOTE — Patient Instructions (Addendum)

## 2021-09-13 ENCOUNTER — Other Ambulatory Visit: Payer: Self-pay | Admitting: Hematology & Oncology

## 2021-09-13 DIAGNOSIS — Z853 Personal history of malignant neoplasm of breast: Secondary | ICD-10-CM

## 2021-09-14 ENCOUNTER — Ambulatory Visit (INDEPENDENT_AMBULATORY_CARE_PROVIDER_SITE_OTHER): Payer: Medicare PPO | Admitting: *Deleted

## 2021-09-14 DIAGNOSIS — Z5181 Encounter for therapeutic drug level monitoring: Secondary | ICD-10-CM

## 2021-09-14 DIAGNOSIS — Z952 Presence of prosthetic heart valve: Secondary | ICD-10-CM | POA: Diagnosis not present

## 2021-09-14 LAB — POCT INR: INR: 2 (ref 2.0–3.0)

## 2021-09-14 NOTE — Patient Instructions (Addendum)
Description   Tomorrow take 1 tablet then continue taking warfarin 1/2 tablet daily except 1 tablet on Sundays, Tuesdays and Thursdays. Recheck in 6 weeks. [facility] [phone]

## 2021-09-16 ENCOUNTER — Ambulatory Visit
Admission: RE | Admit: 2021-09-16 | Discharge: 2021-09-16 | Disposition: A | Discharge status: Home / Self Care | Payer: Medicare PPO | Source: Ambulatory Visit | Attending: Obstetrics | Admitting: Obstetrics

## 2021-09-16 DIAGNOSIS — Z1231 Encounter for screening mammogram for malignant neoplasm of breast: Secondary | ICD-10-CM

## 2021-09-22 ENCOUNTER — Other Ambulatory Visit: Payer: Self-pay | Admitting: Cardiovascular Disease

## 2021-09-22 MED ORDER — ENTRESTO 24-26 MG PO TABS: 1.0000 | ORAL_TABLET | Freq: Two times a day (BID) | ORAL | 1 refills | Status: DC

## 2021-09-24 ENCOUNTER — Ambulatory Visit: Payer: Medicare PPO | Admitting: Family

## 2021-09-24 VITALS — BP 98/60 | HR 72 | Temp 98.6°F | Wt 134.0 lb

## 2021-09-24 DIAGNOSIS — L259 Unspecified contact dermatitis, unspecified cause: Secondary | ICD-10-CM | POA: Diagnosis not present

## 2021-09-24 MED ORDER — METHYLPREDNISOLONE ACETATE 40 MG/ML IJ SUSP
40.0000 mg | Freq: Once | INTRAMUSCULAR | Status: AC
  Administered 2021-09-24: 40 mg via INTRAMUSCULAR

## 2021-09-24 NOTE — Progress Notes (Signed)
Acute Office Visit  Subjective:     Patient ID: Amanda Wiggins, female    DOB: Oct 23, 1945, 76 y.o.   MRN: [id number]  Chief Complaint  Patient presents with  . Rash    Patient reports rash with itch on right side abdomen radiates to back side.     HPI Patient is in today with c/o a rash on the right side of the abdomen that radiates around to her back that she woke up with this am. She reports it is itchy. Denies any changes in detergents, soaps, or lotions.No new foods. Has not been working outside.  Review of Systems  Skin:  Positive for itching and rash.  All other systems reviewed and are negative. Past Medical History:  Diagnosis Date  . Aortic valve replaced   . Breast cancer ([place]) 2005  . FHx: migraine headaches   . GERD (gastroesophageal reflux disease)   . Hiatal hernia   . Hx Breast Cancer, IDC, Stage I 07/03/2003  . Hx of breast cancer   . Hyperlipidemia   . Hypertension   . Menopausal syndrome   . Osteopenia   . 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 Personal history of radiation therapy   . Status post aortic valve repair     Social History   Socioeconomic History  . Marital status: Widowed    Spouse name: Not on file  . Number of children: Not on file  . Years of education: Not on file  . Highest education level: Master's degree (e.g., MA, MS, MEng, MEd, MSW, MBA)  Occupational History  . Not on file  Tobacco Use  . Smoking status: Never  . Smokeless tobacco: Never  Vaping Use  . Vaping Use: Never used  Substance and Sexual Activity  . Alcohol use: Yes    Alcohol/week: 0.0 standard drinks of alcohol    Comment: socially  . Drug use: No  . Sexual activity: Not on file  Other Topics Concern  . Not on file  Social History Narrative  . Not on file   Social Determinants of Health   Financial Resource Strain: Low Risk  (09/24/2021)   Overall Financial Resource Strain (CARDIA)   . Difficulty of Paying Living Expenses: Not hard  at all  Food Insecurity: No Food Insecurity (09/24/2021)   Hunger Vital Sign   . Worried About Charity fundraiser in the Last Year: Never true   . Ran Out of Food in the Last Year: Never true  Transportation Needs: No Transportation Needs (09/24/2021)   PRAPARE - Transportation   . Lack of Transportation (Medical): No   . Lack of Transportation (Non-Medical): No  Physical Activity: Insufficiently Active (09/24/2021)   Exercise Vital Sign   . Days of Exercise per Week: 2 days   . Minutes of Exercise per Session: 30 min  Stress: No Stress Concern Present (09/24/2021)   [place]   . Feeling of Stress : Not at all  Social Connections: Moderately Integrated (09/24/2021)   Social Connection and Isolation Panel [NHANES]   . Frequency of Communication with Friends and Family: More than three times a week   . Frequency of Social Gatherings with Friends and Family: More than three times a week   . Attends Religious Services: More than 4 times per year   . Active Member of Clubs or Organizations: Yes   . Attends Archivist Meetings: More than 4 times per year   .  Marital Status: Widowed  Intimate Partner Violence: Not At Risk (11/05/2020)   Humiliation, Afraid, Rape, and Kick questionnaire   . Fear of Current or Ex-Partner: No   . Emotionally Abused: No   . Physically Abused: No   . Sexually Abused: No    Past Surgical History:  Procedure Laterality Date  . AORTIC VALVE REPLACEMENT  2003  . BREAST BIOPSY Left 2005  . BREAST LUMPECTOMY Left 2005  . CARDIAC CATHETERIZATION     Ejection Fraction   . CARDIAC CATHETERIZATION N/A 05/16/2015   Procedure: Left Heart Cath and Coronary Angiography;  Surgeon: Leonie Man, MD;  Location: [place] [facility];  Service: Cardiovascular;  Laterality: N/A;  . CARDIAC CATHETERIZATION  05/16/2015   Procedure: Coronary Stent Intervention;  Surgeon: Leonie Man, MD;  Location:  [place] [facility];  Service: Cardiovascular;;  . CARDIAC CATHETERIZATION N/A 05/19/2015   Procedure: Left Heart Cath and Coronary Angiography;  Surgeon: Troy Sine, MD;  Location: [facility] [facility];  Service: Cardiovascular;  Laterality: N/A;  . CARDIAC CATHETERIZATION N/A 05/19/2015   Procedure: Coronary Stent Intervention;  Surgeon: Troy Sine, MD;  Location: [facility];  Service: Cardiovascular;  Laterality: N/A;  . HIATAL HERNIA REPAIR    . HIATAL HERNIA REPAIR  05/11/2007  . hysterectomy, endometiral cancer  2001  . lummpectomy breast cancer  07/03/2003  . nissan fundoplicaiton  1607   post-op hematoma on heparin   . right shouler surgery      Family History  Problem Relation Age of Onset  . Heart attack Mother 70  . Osteoarthritis Brother        hpercholesterolemia    Allergies  Allergen Reactions  . Latex Hives  . Meperidine Nausea Only  . Sulfa Antibiotics Nausea Only  . Crestor [Rosuvastatin Calcium] Other (See Comments)    Myalgias on '10mg'$  and '20mg'$  daily  . Pravastatin Other (See Comments)    myalgias  . Sulfamethoxazole Nausea And Vomiting  . Repatha [Evolocumab] Other (See Comments)    myalgias    Current Outpatient Medications on File Prior to Visit  Medication Sig Dispense Refill  . Alirocumab (PRALUENT) 75 MG/ML SOAJ INJECT 1 PEN INTO THE SKIN EVERY 14 (FOURTEEN) DAYS. 6 mL 3  . amoxicillin (AMOXIL) 500 MG capsule For dental work    . BIOTIN PO Take 1 tablet by mouth every morning.    . Calcium Carbonate-Vitamin D 600-400 MG-UNIT tablet Take 1 tablet by mouth 2 (two) times daily.    . carvedilol (COREG) 3.125 MG tablet TAKE 1 TABLET (3.125 MG TOTAL) BY MOUTH 2 (TWO) TIMES DAILY WITH A MEAL. 180 tablet 3  . clopidogrel (PLAVIX) 75 MG tablet TAKE 1 TABLET (75 MG TOTAL) BY MOUTH DAILY WITH BREAKFAST. 90 tablet 3  . cycloSPORINE (RESTASIS) 0.05 % ophthalmic emulsion Place 1 drop into both eyes 2 (two) times daily as needed (DRY EYE).    .  Desoximetasone 0.05 % GEL APPLY TO AFFECTED AREA ONCE TO 2 TIMES DAILY 45 g 6  . Glucosamine-Chondroit-Vit C-Mn (GLUCOSAMINE CHONDR 1500 COMPLX) CAPS Take 1 capsule by mouth every morning.     . loratadine (CLARITIN) 10 MG tablet Take 10 mg by mouth daily.    . Multiple Vitamin (MULTIVITAMIN) capsule Take 1 capsule by mouth daily.    . nitroGLYCERIN (NITROSTAT) 0.4 MG SL tablet Place 1 tablet (0.4 mg total) under the tongue every 5 (five) minutes as needed for chest pain. 25 tablet 6  .  pantoprazole (PROTONIX) 40 MG tablet TAKE 1 TABLET BY MOUTH EVERY DAY 90 tablet 2  . sacubitril-valsartan (ENTRESTO) 24-26 MG Take 1 tablet by mouth 2 (two) times daily. 180 tablet 1  . temazepam (RESTORIL) 30 MG capsule TAKE 1 CAPSULE BY MOUTH AT BEDTIME AS NEEDED FOR SLEEP 30 capsule 0  . warfarin (COUMADIN) 5 MG tablet TAKE AS DIRECTED BY COUMADIN CLINIC 80 tablet 1   No current facility-administered medications on file prior to visit.    BP 98/60 (BP Location: Left Arm, Patient Position: Sitting, Cuff Size: Normal)   Pulse 72   Temp 98.6 F (37 C) (Oral)   Wt 134 lb (60.8 kg)   SpO2 97%   BMI 25.32 kg/m chart      Objective:    BP 98/60 (BP Location: Left Arm, Patient Position: Sitting, Cuff Size: Normal)   Pulse 72   Temp 98.6 F (37 C) (Oral)   Wt 134 lb (60.8 kg)   SpO2 97%   BMI 25.32 kg/m    Physical Exam Vitals and nursing note reviewed.  Constitutional:      Appearance: Normal appearance.  Cardiovascular:     Rate and Rhythm: Normal rate and regular rhythm.     Pulses: Normal pulses.     Heart sounds: Normal heart sounds.  Pulmonary:     Effort: Pulmonary effort is normal.     Breath sounds: Normal breath sounds.  Abdominal:       Comments: Fine, granular, erythematous rash noted to the lower abdomen.   Skin:    Findings: Erythema and rash present.  Neurological:     General: No focal deficit present.     Mental Status: She is alert and oriented to person, place, and  time.  Psychiatric:        Mood and Affect: Mood normal.        Behavior: Behavior normal.   No results found for any visits on 09/24/21.      Assessment & Plan:   Problem List Items Addressed This Visit   None Visit Diagnoses     Contact dermatitis, unspecified contact dermatitis type, unspecified trigger    -  Primary   Relevant Medications   methylPREDNISolone acetate (DEPO-MEDROL) injection 40 mg (Completed)       Meds ordered this encounter  Medications  . methylPREDNISolone acetate (DEPO-MEDROL) injection 40 mg   Call the office if symptoms worsen or persist. Recheck as scheduled and as needed.   No follow-ups on file.  Kennyth Arnold, FNP

## 2021-10-14 ENCOUNTER — Other Ambulatory Visit: Payer: Self-pay | Admitting: Hematology & Oncology

## 2021-10-14 DIAGNOSIS — Z853 Personal history of malignant neoplasm of breast: Secondary | ICD-10-CM

## 2021-10-15 ENCOUNTER — Encounter: Payer: Self-pay | Admitting: Family

## 2021-10-19 ENCOUNTER — Ambulatory Visit: Payer: Medicare PPO | Admitting: Physician Assistant

## 2021-10-24 ENCOUNTER — Other Ambulatory Visit: Payer: Self-pay | Admitting: Cardiovascular Disease

## 2021-10-27 ENCOUNTER — Ambulatory Visit (INDEPENDENT_AMBULATORY_CARE_PROVIDER_SITE_OTHER): Payer: Medicare PPO

## 2021-10-27 DIAGNOSIS — Z5181 Encounter for therapeutic drug level monitoring: Secondary | ICD-10-CM | POA: Diagnosis not present

## 2021-10-27 LAB — POCT INR: INR: 2.1 (ref 2.0–3.0)

## 2021-10-27 NOTE — Patient Instructions (Signed)
Description   Today take 1 tablet then continue taking warfarin 1/2 tablet daily except 1 tablet on Sundays, Tuesdays and Thursdays. Recheck in 6 weeks. Cou[facility]in Clinic [phone]

## 2021-11-02 DIAGNOSIS — H6123 Impacted cerumen, bilateral: Secondary | ICD-10-CM | POA: Diagnosis not present

## 2021-11-03 ENCOUNTER — Telehealth: Payer: Self-pay | Admitting: Family Medicine

## 2021-11-03 NOTE — Telephone Encounter (Signed)
Left message for patient to call back and schedule Medicare Annual Wellness Visit (AWV) either virtually or in office. Left  my [name] number [phone]   Last AWV ;11/05/20  please schedule at anytime with [facility] Nurse Health Advisor 1 or 2

## 2021-11-04 DIAGNOSIS — H903 Sensorineural hearing loss, bilateral: Secondary | ICD-10-CM | POA: Diagnosis not present

## 2021-11-15 ENCOUNTER — Other Ambulatory Visit: Payer: Self-pay | Admitting: Hematology & Oncology

## 2021-11-15 DIAGNOSIS — Z853 Personal history of malignant neoplasm of breast: Secondary | ICD-10-CM

## 2021-11-16 DIAGNOSIS — D225 Melanocytic nevi of trunk: Secondary | ICD-10-CM | POA: Diagnosis not present

## 2021-11-16 DIAGNOSIS — X32XXXA Exposure to sunlight, initial encounter: Secondary | ICD-10-CM | POA: Diagnosis not present

## 2021-11-16 DIAGNOSIS — L820 Inflamed seborrheic keratosis: Secondary | ICD-10-CM | POA: Diagnosis not present

## 2021-11-16 DIAGNOSIS — Z1283 Encounter for screening for malignant neoplasm of skin: Secondary | ICD-10-CM | POA: Diagnosis not present

## 2021-11-16 DIAGNOSIS — L570 Actinic keratosis: Secondary | ICD-10-CM | POA: Diagnosis not present

## 2021-11-17 ENCOUNTER — Encounter: Payer: Self-pay | Admitting: Family

## 2021-11-24 ENCOUNTER — Ambulatory Visit (INDEPENDENT_AMBULATORY_CARE_PROVIDER_SITE_OTHER): Payer: Medicare PPO | Admitting: Family Medicine

## 2021-11-24 VITALS — BP 118/68 | HR 62 | Temp 98.5°F | Ht 61.25 in | Wt 134.0 lb

## 2021-11-24 DIAGNOSIS — H9193 Unspecified hearing loss, bilateral: Secondary | ICD-10-CM | POA: Diagnosis not present

## 2021-11-24 DIAGNOSIS — G2581 Restless legs syndrome: Secondary | ICD-10-CM

## 2021-11-24 DIAGNOSIS — M818 Other osteoporosis without current pathological fracture: Secondary | ICD-10-CM

## 2021-11-24 DIAGNOSIS — R2689 Other abnormalities of gait and mobility: Secondary | ICD-10-CM | POA: Diagnosis not present

## 2021-11-24 DIAGNOSIS — I5022 Chronic systolic (congestive) heart failure: Secondary | ICD-10-CM

## 2021-11-24 DIAGNOSIS — I739 Peripheral vascular disease, unspecified: Secondary | ICD-10-CM

## 2021-11-24 DIAGNOSIS — Z0000 Encounter for general adult medical examination without abnormal findings: Secondary | ICD-10-CM | POA: Diagnosis not present

## 2021-11-24 DIAGNOSIS — Z23 Encounter for immunization: Secondary | ICD-10-CM

## 2021-11-24 DIAGNOSIS — T386X5A Adverse effect of antigonadotrophins, antiestrogens, antiandrogens, not elsewhere classified, initial encounter: Secondary | ICD-10-CM

## 2021-11-24 LAB — CBC WITH DIFFERENTIAL/PLATELET
Basophils Absolute: 0 10*3/uL (ref 0.0–0.1)
Basophils Relative: 0.6 % (ref 0.0–3.0)
Eosinophils Absolute: 0.1 10*3/uL (ref 0.0–0.7)
Eosinophils Relative: 1.9 % (ref 0.0–5.0)
HCT: 40.2 % (ref 36.0–46.0)
Hemoglobin: 13.1 g/dL (ref 12.0–15.0)
Lymphocytes Relative: 26.3 % (ref 12.0–46.0)
Lymphs Abs: 1.3 10*3/uL (ref 0.7–4.0)
MCHC: 32.5 g/dL (ref 30.0–36.0)
MCV: 89.7 fl (ref 78.0–100.0)
Monocytes Absolute: 0.4 10*3/uL (ref 0.1–1.0)
Monocytes Relative: 8.6 % (ref 3.0–12.0)
Neutro Abs: 3.2 10*3/uL (ref 1.4–7.7)
Neutrophils Relative %: 62.6 % (ref 43.0–77.0)
Platelets: 359 10*3/uL (ref 150.0–400.0)
RBC: 4.49 Mil/uL (ref 3.87–5.11)
RDW: 13.1 % (ref 11.5–15.5)
WBC: 5 10*3/uL (ref 4.0–10.5)

## 2021-11-24 LAB — BASIC METABOLIC PANEL
BUN: 21 mg/dL (ref 6–23)
CO2: 28 mEq/L (ref 19–32)
Calcium: 10.2 mg/dL (ref 8.4–10.5)
Chloride: 105 mEq/L (ref 96–112)
Creatinine, Ser: 0.92 mg/dL (ref 0.40–1.20)
GFR: 61.21 mL/min (ref >60.00)
Glucose, Bld: 130 mg/dL — ABNORMAL HIGH (ref 70–99)
Potassium: 5.2 mEq/L — ABNORMAL HIGH (ref 3.5–5.1)
Sodium: 141 mEq/L (ref 135–145)

## 2021-11-24 LAB — LIPID PANEL
Cholesterol: 144 mg/dL (ref 0–200)
HDL: 48.9 mg/dL (ref >39.00)
LDL Cholesterol: 58 mg/dL (ref 0–99)
NonHDL: 94.78
Total CHOL/HDL Ratio: 3
Triglycerides: 183 mg/dL — ABNORMAL HIGH (ref 0.0–149.0)
VLDL: 36.6 mg/dL (ref 0.0–40.0)

## 2021-11-24 LAB — TSH: TSH: 3.12 u[IU]/mL (ref 0.35–5.50)

## 2021-11-24 LAB — T4, FREE: Free T4: 1.05 ng/dL (ref 0.60–1.60)

## 2021-11-24 LAB — VITAMIN D 25 HYDROXY (VIT D DEFICIENCY, FRACTURES): VITD: 66.43 ng/mL (ref 30.00–100.00)

## 2021-11-24 LAB — HEMOGLOBIN A1C: Hgb A1c MFr Bld: 6.6 % — ABNORMAL HIGH (ref 4.6–6.5)

## 2021-11-24 LAB — MAGNESIUM: Magnesium: 1.9 mg/dL (ref 1.5–2.5)

## 2021-11-24 NOTE — Progress Notes (Signed)
Subjective:     Amanda Wiggins is a 76 y.o. female and is here for a comprehensive physical exam.  Patient had a wellness check at home via insurance company.  Was told she had PAD, borderline and right leg and severe in left.  Patient denies pain, edema, discoloration of bilateral LEs.  Patient endorses continued balance issues.  Denies dizziness. Veers toward left when walking.  Tripped in backyard.  Advised to decrease core strength likely contributing to symptoms.  Patient does not want more PT.  Patient has upcoming appointment with ENT for hearing.  States asking people to repeat themselves and cannot hear the car blinkers.  Was told she had moderate hearing loss and audiology appointment.  Has eye appointment scheduled 2/2 history of cataracts.  Patient followed by Ortho for neck pain 2/2 cervical disc degeneration.  Walking in Silver sneakers made pain worse.  Given steroid injection, Toradol, steroid pack.  Since last CPE on 11/05/2020 patient lost 15 pounds and has stopped eating [place] as her hemoglobin A1c was 6.7%.  Social History   Socioeconomic History   Marital status: Widowed    Spouse name: Not on file   Number of children: Not on file   Years of education: Not on file   Highest education level: Master's degree (e.g., MA, MS, MEng, MEd, MSW, MBA)  Occupational History   Not on file  Tobacco Use   Smoking status: Never   Smokeless tobacco: Never  Vaping Use   Vaping Use: Never used  Substance and Sexual Activity   Alcohol use: Yes    Alcohol/week: 0.0 standard drinks of alcohol    Comment: socially   Drug use: No   Sexual activity: Not on file  Other Topics Concern   Not on file  Social History Narrative   Not on file   Social Determinants of Health   Financial Resource Strain: Low Risk  (09/24/2021)   Overall Financial Resource Strain (CARDIA)    Difficulty of Paying Living Expenses: Not hard at all  Food Insecurity: No Food Insecurity (09/24/2021)   Hunger Vital  Sign    Worried About Running Out of Food in the Last Year: Never true    Ran Out of Food in the Last Year: Never true  Transportation Needs: No Transportation Needs (09/24/2021)   PRAPARE - Hydrologist (Medical): No    Lack of Transportation (Non-Medical): No  Physical Activity: Insufficiently Active (09/24/2021)   Exercise Vital Sign    Days of Exercise per Week: 2 days    Minutes of Exercise per Session: 30 min  Stress: No Stress Concern Present (09/24/2021)   [place]    Feeling of Stress : Not at all  Social Connections: Moderately Integrated (09/24/2021)   Social Connection and Isolation Panel [NHANES]    Frequency of Communication with Friends and Family: More than three times a week    Frequency of Social Gatherings with Friends and Family: More than three times a week    Attends Religious Services: More than 4 times per year    Active Member of [employer] or Organizations: Yes    Attends Archivist Meetings: More than 4 times per year    Marital Status: Widowed  Intimate Partner Violence: Not At Risk (11/05/2020)   Humiliation, Afraid, Rape, and Kick questionnaire    Fear of Current or Ex-Partner: No    Emotionally Abused: No    Physically  Abused: No    Sexually Abused: No   Health Maintenance  Topic Date Due   Zoster Vaccines- Shingrix (2 of 2) 09/14/2016   Fecal DNA (Cologuard)  07/29/2019   COVID-19 Vaccine (7 - Moderna risk series) 09/24/2021   INFLUENZA VACCINE  10/05/2021   MAMMOGRAM  09/17/2022   TETANUS/TDAP  02/24/2026   Pneumonia Vaccine 33+ Years old  Completed   DEXA SCAN  Completed   Hepatitis C Screening  Completed   HPV VACCINES  Aged Out    The following portions of the patient's history were reviewed and updated as appropriate: allergies, current medications, past family history, past medical history, past social history, past surgical history, and  problem list.  Review of Systems Pertinent items noted in HPI and remainder of comprehensive ROS otherwise negative.   Objective:    BP 118/68 (BP Location: Left Arm, Patient Position: Sitting, Cuff Size: Normal)   Pulse 62   Temp 98.5 F (36.9 C) (Oral)   Ht 5' 1.25" (1.556 m)   Wt 134 lb (60.8 kg)   SpO2 98%   BMI 25.11 kg/m  General appearance: alert, cooperative, and no distress Head: Normocephalic, without obvious abnormality, atraumatic Eyes: conjunctivae/corneas clear. PERRL, EOM's intact. Fundi benign. Ears: normal TM's and external ear canals both ears Nose: Nares normal. Septum midline. Mucosa normal. No drainage or sinus tenderness. Throat: lips, mucosa, and tongue normal; teeth and gums normal Neck: no adenopathy, no carotid bruit, no JVD, supple, symmetrical, trachea midline, and thyroid not enlarged, symmetric, no tenderness/mass/nodules Lungs: clear to auscultation bilaterally Heart: regular rate and rhythm, S1, S2 normal, no murmur, click, rub or gallop Abdomen: soft, non-tender; bowel sounds normal; no masses,  no organomegaly Extremities: extremities normal, atraumatic, no cyanosis or edema 2+ DP pulses Pulses: 2+ and symmetric Skin: Skin color, texture, turgor normal. No rashes or lesions Lymph nodes: Cervical, supraclavicular, and axillary nodes normal. Neurologic: Alert and oriented X 3, normal strength and tone. Normal symmetric reflexes. Normal coordination and gait    Assessment:    Healthy female exam.      Plan:    Anticipatory guidance given including wearing seatbelts, smoke detectors in the home, increasing physical activity, increasing p.o. intake of water and vegetables. -labs -Mammogram done 09/16/2021 -Colonoscopy done 08/26/2016 -Bone density done 04/21/2020-advised process noted -Immunizations reviewed -Given handout -Next CPE in 1 year See After Visit Summary for Counseling Recommendations   Chronic systolic CHF (congestive heart  failure) ([place]) - Plan: Basic metabolic panel  Balance problem -Discussed chair exercises -Patient encouraged to reconsider PT - Plan: CBC with Differential/Platelet, Basic metabolic panel, TSH, T4, Free, Hemoglobin A1c  Bilateral hearing loss, unspecified hearing loss type  PVD (peripheral vascular disease) (HCC) -Asymptomatic -RLE ABI 0.9-0.99 -RLE ABI 0.0-0.29 -Discussed supportive care including elevating LEs, compression socks, medical therapies. 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 Vito Berger - Plan: Vitamin D, 25-hydroxy  Restless leg  - Plan: Basic metabolic panel, Vitamin D, 25-hydroxy, Magnesium  Follow-up as needed  [name], MD

## 2021-11-29 ENCOUNTER — Encounter: Payer: Self-pay | Admitting: Family Medicine

## 2021-12-08 ENCOUNTER — Ambulatory Visit: Payer: Medicare PPO | Attending: Cardiovascular Disease

## 2021-12-08 DIAGNOSIS — Z5181 Encounter for therapeutic drug level monitoring: Secondary | ICD-10-CM

## 2021-12-08 DIAGNOSIS — Z952 Presence of prosthetic heart valve: Secondary | ICD-10-CM | POA: Diagnosis not present

## 2021-12-08 LAB — POCT INR: INR: 3 (ref 2.0–3.0)

## 2021-12-08 NOTE — Patient Instructions (Signed)
Description   Continue taking warfarin 1/2 tablet daily except 1 tablet on Sundays, Tuesdays and Thursdays. Recheck in 6 weeks. [facility] [phone]     

## 2021-12-14 DIAGNOSIS — Z01419 Encounter for gynecological examination (general) (routine) without abnormal findings: Secondary | ICD-10-CM | POA: Diagnosis not present

## 2021-12-14 DIAGNOSIS — C541 Malignant neoplasm of endometrium: Secondary | ICD-10-CM | POA: Diagnosis not present

## 2021-12-15 ENCOUNTER — Other Ambulatory Visit: Payer: Self-pay | Admitting: Hematology & Oncology

## 2021-12-15 DIAGNOSIS — Z853 Personal history of malignant neoplasm of breast: Secondary | ICD-10-CM

## 2021-12-16 ENCOUNTER — Encounter: Payer: Self-pay | Admitting: Family

## 2021-12-30 ENCOUNTER — Other Ambulatory Visit: Payer: Self-pay | Admitting: Cardiovascular Disease

## 2021-12-30 ENCOUNTER — Telehealth: Payer: Self-pay | Admitting: Family Medicine

## 2021-12-30 NOTE — Telephone Encounter (Signed)
Left message for patient to call back and schedule Medicare Annual Wellness Visit (AWV) either virtually or in office. Left  my [name] number [phone]   Last AWV 11/05/20 please schedule with Nurse Health Adviser   45 min for awv-i and in office appointments 30 min for awv-s  phone/virtual appointments

## 2022-01-03 DIAGNOSIS — H903 Sensorineural hearing loss, bilateral: Secondary | ICD-10-CM | POA: Insufficient documentation

## 2022-01-09 ENCOUNTER — Encounter: Payer: Self-pay | Admitting: Cardiovascular Disease

## 2022-01-09 NOTE — Progress Notes (Unsigned)
Amanda Wiggins Date of Birth  06-21-1945 [facility] [address]    [place], [place]  [address] [phone]  Fax  [phone]  Problem List 1. Aortic valve replacement 2. Hyperlipidemia 3. Breast cancer 4. CAD -  Mid LAD to Dist LAD lesion, 100% stenosed. Post intervention with a single Synergy DES 2.25 mm x 20 mm S/p mini vision stent to distal LAD   Amanda Wiggins is a 76 year old female with a history of aortic stenosis-status post aortic valve replacement.  She also has a history of hypercholesterolemia.  She's not had any episodes of chest pain or shortness of breath.  She's had her INR levels checked in our Coumadin clinic. Her INR levels have all been therapeutic.  Nov. 6 , 2014  Amanda Wiggins is doing well.  i saw her a year ago.  She is 10 years our from her breast cancer and is doing well.    No cardiac complaints.   Able to do all of her normal activities.     Nov. 13, 2015:  Amanda Wiggins is doing well.   No CP .  No dyspnea.   Has been stressful.  Her mother died this past year.   Her INR levels have been  Well controlled   Nov. 14, 2016:  Doing well.  No cardiac issues.  INR levels have been stable,  A bit low the last time .  Had an AVR 13 years ago .    June 05, 2015:  Amanda Wiggins was recently seen at the hospital for an acute anterior wall ST segment elevation myocardial infarction. She had stenting of her mid LAD. She had recurrent pain on the day of her original discharge and was taken back to the Cath Lab and had a second stent placed. She continued to have some intermittent episodes of chest discomfort. She has had persistent ST segment elevation. Her left ventricular systolic function is moderately depressed with an EF of 40-45%.   She has akinesis of the distal anterior wall and apex.   She is not having the band like chest pain that she had on presentation Has had some GERD.   Asked about going back on Omeprazole  - we discussed her need for Protonix with the  plavix .   September 01, 2015:  Amanda Wiggins is seen back for follow up visit. Still having trouble getting over her MI .  Still fatigued but is slowly getting better.   No CP.      Dec. 12, 2017:  feeling ok Does not have the energy that she wants ( and used to have prior to her MI )  BP has been in the 90-100 range.   September 21, 2016:    Amanda Wiggins is seen today for her AVR and CAD / MI  Her husband, Amanda Wiggins, died last week  Has some leg aches.   Feb. 13, 2019: Amanda Wiggins is seen back today for follow up of her AVR and CHF Echo shows LV EF of 40% , mean AV gradient is 21 mmHg.  Is on coumadin and plavix  Has occasional left shoulder pain  Had stenting of LAD in March 2017.    Has had some balance issues.   Saw ENT. Thinks it may be orthostatic hypotension  Had ears cleaned out.   Feels better  Aug. 26, 2019:  Doing well. Has CAD, AVR   No recent CP , Gets winded with little exertion EF is 40%.    Bruises quite a bit -  is on coumadin and plavix   Feb. 24, 2020:  Seen for follow up of her CAD and AVR. Continues to have balance issues.   Lists to one side. She has been going to PT .   Was thought to be due to muscle weakness possibly related to the crestor.   She is on Pralulent  And her balance and muscle strength is better.  Works out at [employer] and is doing yoga   January, 2021:  Amanda Wiggins is seen today for a follow-up visit.  She was in the emergency room last week with chest pain. The pain was described as an achiness located under both breast.  It did been there for a week.  Pain seems to be different than her angina equivalent.  She had -2 - troponins while in the emergency room and was discharged in satisfactory condition.  The pain settled in her left shoulder blade .  No dyspnea, no pleuretic cp   Sept. 2, 2021  Amanda Wiggins is seen today for follow up of her AVR, CAD, CHF Recent echo revealed reduced LV function with EF 30-35% We brought her back to start Entresto but her BP was too low to  start Entresto.   Was started on Losartan instead.  Was started on Lasix 20 mg 3 times a weeks.    Is avoiding salt and salty foods.   January 10, 2020: Amanda Wiggins is seen today for follow-up visit.  She has a history of aortic valve replacement, coronary artery disease, congestive heart failure.  Her echocardiogram shows an ejection fraction of 30 to 35%.  We have started her on losartan and lasix   She has been changed to [facility] .  Is off lasix .   Feeling better on the [facility]   Has had some left arm / neck pain   Jul 09, 2020:  Amanda Wiggins is seen today for follow up of her AVR, CAD, CHF Is on Entresto . Seems to be tolerating it well Was falling  But with PT she is getting stronger and has not fallen in a while  Is going to the gym regularly ,   INR is 1.9 today  Is tolerating the low dose entresto BP is marginal but no orthostasis   Nov. 1, 2022:  Amanda Wiggins is seen today for follow up of her AVR, CAD, CHF On entresto, LV function has not changed much - 30-35% Grade 1 DD  Mean AV gradient of 17 mmhg     Is back in the gym. Walks outside also  Has some DOE with exercise    Nov. 6, 2023  Amanda Wiggins is seen today for follow up of AVR, CAD, CHF Asked about the RSV vaccine - not able to give a comment on this .   Has lost weight by avoiding carbs  Has not been exercising much until recently  Is back in the gym 3 days a week .    Has chronic combined CHF On Coreg, Entresto   Her latest BMP revealed a potassium level of 5.2   Recent glucose was 130, HbA1C was 6.6       Current Outpatient Medications on File Prior to Visit  Medication Sig Dispense Refill   Alirocumab (PRALUENT) 75 MG/ML SOAJ INJECT 1 PEN INTO THE SKIN EVERY 14 (FOURTEEN) DAYS. 6 mL 3   amoxicillin (AMOXIL) 500 MG capsule For dental work     BIOTIN PO Take 1 tablet by mouth every morning.     Calcium Carbonate-Vitamin  D 600-400 MG-UNIT tablet Take 1 tablet by mouth 2 (two) times daily.     carvedilol (COREG)  3.125 MG tablet TAKE 1 TABLET (3.125 MG TOTAL) BY MOUTH 2 (TWO) TIMES DAILY WITH A MEAL. 180 tablet 3   clopidogrel (PLAVIX) 75 MG tablet TAKE 1 TABLET BY MOUTH DAILY WITH BREAKFAST. 90 tablet 0   cycloSPORINE (RESTASIS) 0.05 % ophthalmic emulsion Place 1 drop into both eyes 2 (two) times daily as needed (DRY EYE).     Desoximetasone 0.05 % GEL APPLY TO AFFECTED AREA ONCE TO 2 TIMES DAILY 45 g 6   Glucosamine-Chondroit-Vit C-Mn (GLUCOSAMINE CHONDR 1500 COMPLX) CAPS Take 1 capsule by mouth every morning.      loratadine (CLARITIN) 10 MG tablet Take 10 mg by mouth daily.     Multiple Vitamin (MULTIVITAMIN) capsule Take 1 capsule by mouth daily.     nitroGLYCERIN (NITROSTAT) 0.4 MG SL tablet Place 1 tablet (0.4 mg total) under the tongue every 5 (five) minutes as needed for chest pain. 25 tablet 6   pantoprazole (PROTONIX) 40 MG tablet TAKE 1 TABLET BY MOUTH EVERY DAY 90 tablet 1   sacubitril-valsartan (ENTRESTO) 24-26 MG Take 1 tablet by mouth 2 (two) times daily. 180 tablet 1   temazepam (RESTORIL) 30 MG capsule TAKE 1 CAPSULE BY MOUTH AT BEDTIME AS NEEDED FOR SLEEP 30 capsule 0   warfarin (COUMADIN) 5 MG tablet TAKE AS DIRECTED BY COUMADIN CLINIC 80 tablet 1   No current facility-administered medications on file prior to visit.    Allergies  Allergen Reactions   Latex Hives   Meperidine Nausea Only   Sulfa Antibiotics Nausea Only   Crestor [Rosuvastatin Calcium] Other (See Comments)    Myalgias on '10mg'$  and '20mg'$  daily   Pravastatin Other (See Comments)    myalgias   Sulfamethoxazole Nausea And Vomiting   Repatha [Evolocumab] Other (See Comments)    myalgias    Past Medical History:  Diagnosis Date   Aortic valve replaced    Breast cancer ([place]) 2005   FHx: migraine headaches    GERD (gastroesophageal reflux disease)    Hiatal hernia    Hx Breast Cancer, IDC, Stage I 07/03/2003   Hx of breast cancer    Hyperlipidemia    Hypertension    Menopausal syndrome    Osteopenia     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 08/22/2011   Personal history of radiation therapy    Status post aortic valve repair     Past Surgical History:  Procedure Laterality Date   AORTIC VALVE REPLACEMENT  2003   BREAST BIOPSY Left 2005   BREAST LUMPECTOMY Left 2005   CARDIAC CATHETERIZATION     Ejection Fraction    CARDIAC CATHETERIZATION N/A 05/16/2015   Procedure: Left Heart Cath and Coronary Angiography;  Surgeon: Leonie Man, MD;  Location: [facility];  Service: Cardiovascular;  Laterality: N/A;   CARDIAC CATHETERIZATION  05/16/2015   Procedure: Coronary Stent Intervention;  Surgeon: Leonie Man, MD;  Location: [facility];  Service: Cardiovascular;;   CARDIAC CATHETERIZATION N/A 05/19/2015   Procedure: Left Heart Cath and Coronary Angiography;  Surgeon: Troy Sine, MD;  Location: [facility];  Service: Cardiovascular;  Laterality: N/A;   CARDIAC CATHETERIZATION N/A 05/19/2015   Procedure: Coronary Stent Intervention;  Surgeon: Troy Sine, MD;  Location: [facility];  Service: Cardiovascular;  Laterality: N/A;   HIATAL HERNIA REPAIR     HIATAL HERNIA REPAIR  05/11/2007   hysterectomy, endometiral cancer  2001   lummpectomy breast cancer  33/38/3291   nissan fundoplicaiton  9166   post-op hematoma on heparin    right shouler surgery      Social History   Tobacco Use  Smoking Status Never  Smokeless Tobacco Never    Social History   Substance and Sexual Activity  Alcohol Use Yes   Alcohol/week: 0.0 standard drinks of alcohol   Comment: socially    Family History  Problem Relation Age of Onset   Heart attack Mother 73   Osteoarthritis Brother        hpercholesterolemia    Reviw of Systems:  Noted in current history, otherwise review of systems is negative.  Physical Exam: Blood pressure 120/68, pulse 73, height '5\' 1"'$  (1.549 m), weight 133 lb 3.2 oz (60.4 kg), SpO2 92 %.       GEN:  Well nourished, well  developed in no acute distress HEENT: Normal NECK: No JVD; No carotid bruits LYMPHATICS: No lymphadenopathy CARDIAC: RRR , mechanical S1  RESPIRATORY:  Clear to auscultation without rales, wheezing or rhonchi  ABDOMEN: Soft, non-tender, non-distended MUSCULOSKELETAL:  No edema; No deformity  SKIN: Warm and dry NEUROLOGIC:  Alert and oriented x 3    ECG:   6    Assessment / Plan:   1. Aortic valve replacement -      stable,  on warfarin , theraputic      2. Hyperlipidemia -     LDL is 58.  , con t meds.     3.  Chronic systolic/ diastolic congestive heart failure: She is now on carvedilol, Entresto 24-26 twice a day.   Will add Jardiance 10 mg a day for its CV benefits       4. CAD  -   no angina      Mertie Moores, MD  01/10/2022 1:42 PM    [facility] [place],  [place],   [address] Pager [phone] Phone: [phone]; Fax: [phone]

## 2022-01-10 ENCOUNTER — Ambulatory Visit: Payer: Medicare PPO | Attending: Cardiovascular Disease | Admitting: Cardiovascular Disease

## 2022-01-10 ENCOUNTER — Encounter: Payer: Self-pay | Admitting: Cardiovascular Disease

## 2022-01-10 VITALS — BP 120/68 | HR 73 | Ht 61.0 in | Wt 133.2 lb

## 2022-01-10 DIAGNOSIS — Z7901 Long term (current) use of anticoagulants: Secondary | ICD-10-CM

## 2022-01-10 DIAGNOSIS — I5042 Chronic combined systolic (congestive) and diastolic (congestive) heart failure: Secondary | ICD-10-CM

## 2022-01-10 DIAGNOSIS — Z9861 Coronary angioplasty status: Secondary | ICD-10-CM | POA: Diagnosis not present

## 2022-01-10 DIAGNOSIS — E785 Hyperlipidemia, unspecified: Secondary | ICD-10-CM

## 2022-01-10 DIAGNOSIS — I2510 Atherosclerotic heart disease of native coronary artery without angina pectoris: Secondary | ICD-10-CM | POA: Diagnosis not present

## 2022-01-10 MED ORDER — NITROGLYCERIN 0.4 MG SL SUBL: SUBLINGUAL_TABLET | SUBLINGUAL | 6 refills | Status: AC

## 2022-01-10 MED ORDER — EMPAGLIFLOZIN 10 MG PO TABS: 10.0000 mg | ORAL_TABLET | Freq: Every day | ORAL | 11 refills | Status: DC

## 2022-01-10 NOTE — Patient Instructions (Signed)
Medication Instructions:  START Jardiance '10mg'$  daily *If you need a refill on your cardiac medications before your next appointment, please call your pharmacy*   Lab Work: BMET today and repeat in 3 months If you have labs (blood work) drawn today and your tests are completely normal, you will receive your results only by: [place] (if you have MyChart) OR A paper copy in the mail If you have any lab test that is abnormal or we need to change your treatment, we will call you to review the results.   Testing/Procedures: NONE   Follow-Up: At [facility], you and your health needs are our priority.  As part of our continuing mission to provide you with exceptional heart care, we have created designated Provider Care Teams.  These Care Teams include your primary Cardiologist (physician) and Advanced Practice Providers (APPs -  Physician Assistants and Nurse Practitioners) who all work together to provide you with the care you need, when you need it.  Your next appointment:   3 month(s)  The format for your next appointment:   In Person  Provider:   Mertie Moores, MD  or Christen Bame, NP     Important Information About Sugar

## 2022-01-11 LAB — BASIC METABOLIC PANEL
BUN/Creatinine Ratio: 16 (ref 12–28)
BUN: 16 mg/dL (ref 8–27)
CO2: 25 mmol/L (ref 20–29)
Calcium: 9.2 mg/dL (ref 8.7–10.3)
Chloride: 104 mmol/L (ref 96–106)
Creatinine, Ser: 1.01 mg/dL — ABNORMAL HIGH (ref 0.57–1.00)
Glucose: 125 mg/dL — ABNORMAL HIGH (ref 70–99)
Potassium: 3.8 mmol/L (ref 3.5–5.2)
Sodium: 141 mmol/L (ref 134–144)
eGFR: 58 mL/min/{1.73_m2} — ABNORMAL LOW (ref >59)

## 2022-01-16 ENCOUNTER — Other Ambulatory Visit: Payer: Self-pay | Admitting: Hematology & Oncology

## 2022-01-16 DIAGNOSIS — Z853 Personal history of malignant neoplasm of breast: Secondary | ICD-10-CM

## 2022-01-19 ENCOUNTER — Ambulatory Visit: Payer: Medicare PPO | Admitting: Family Medicine

## 2022-01-19 ENCOUNTER — Ambulatory Visit: Payer: Medicare PPO | Attending: Internal Medicine | Admitting: *Deleted

## 2022-01-19 VITALS — BP 108/60 | HR 62 | Temp 97.7°F | Wt 133.4 lb

## 2022-01-19 DIAGNOSIS — E118 Type 2 diabetes mellitus with unspecified complications: Secondary | ICD-10-CM

## 2022-01-19 DIAGNOSIS — I5042 Chronic combined systolic (congestive) and diastolic (congestive) heart failure: Secondary | ICD-10-CM

## 2022-01-19 DIAGNOSIS — Z952 Presence of prosthetic heart valve: Secondary | ICD-10-CM

## 2022-01-19 DIAGNOSIS — E781 Pure hyperglyceridemia: Secondary | ICD-10-CM

## 2022-01-19 DIAGNOSIS — Z5181 Encounter for therapeutic drug level monitoring: Secondary | ICD-10-CM | POA: Diagnosis not present

## 2022-01-19 DIAGNOSIS — Z7901 Long term (current) use of anticoagulants: Secondary | ICD-10-CM

## 2022-01-19 DIAGNOSIS — I10 Essential (primary) hypertension: Secondary | ICD-10-CM

## 2022-01-19 LAB — POCT INR: INR: 2.6 (ref 2.0–3.0)

## 2022-01-19 NOTE — Patient Instructions (Signed)
Description   Continue taking warfarin 1/2 tablet daily except 1 tablet on Sundays, Tuesdays and Thursdays. Recheck in 6 weeks. Coumadin Clinic [phone]

## 2022-01-19 NOTE — Progress Notes (Signed)
Subjective:    Patient ID: Amanda Wiggins, female    DOB: December 08, 1945, 76 y.o.   MRN: [id number]  Chief Complaint  Patient presents with   Results    Discuss results from labs    HPI Patient is a 76 year old female with combined iron CHF, CAD s/p PCI, HTN, HLD, h/o breast cancer, GERD, osteopenia who was seen today for f/u on labs.  Hemoglobin A1c was 6.6%, triglycerides and potassium mildly elevated 11/24/2021.  Patient was advised to follow-up in a few weeks to recheck and discuss further.  Patient endorses making lifestyle modifications.  May have been eating more fast food around the time labs were taken.  Patient had recent follow-up with cardiology, started on Jardiance.  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 08/22/2011   Personal history of radiation therapy    Status post aortic valve repair     Allergies  Allergen Reactions   Latex Hives   Meperidine Nausea Only   Sulfa Antibiotics Nausea Only   Crestor [Rosuvastatin Calcium] Other (See Comments)    Myalgias on '10mg'$  and '20mg'$  daily   Pravastatin Other (See Comments)    myalgias   Sulfamethoxazole Nausea And Vomiting   Repatha [Evolocumab] Other (See Comments)    myalgias    ROS General: Denies fever, chills, night sweats, changes in weight, changes in appetite HEENT: Denies headaches, ear pain, changes in vision, rhinorrhea, sore throat CV: Denies CP, palpitations, SOB, orthopnea Pulm: Denies SOB, cough, wheezing GI: Denies abdominal pain, nausea, vomiting, diarrhea, constipation GU: Denies dysuria, hematuria, frequency, vaginal discharge Msk: Denies muscle cramps, joint pains Neuro: Denies  weakness, numbness, tingling Skin: Denies rashes, bruising Psych: Denies depression, anxiety, hallucinations     Objective:    Blood pressure 108/60, pulse 62, temperature 97.7 F (36.5 C), temperature source Oral, weight 133 lb 6.4 oz (60.5 kg), SpO2 95 %.  Gen. Pleasant, well-nourished, in no distress, normal affect   HEENT: [place]/AT, face symmetric, conjunctiva clear, no scleral icterus, PERRLA, EOMI, nares patent without drainage Lungs: no accessory muscle use, CTAB, no wheezes or rales Cardiovascular: RRR, no m/r/g, no peripheral edema Musculoskeletal: No deformities, no cyanosis or clubbing, normal tone Neuro:  A&Ox3, CN II-XII intact, normal gait Skin:  Warm, no lesions/ rash   Wt Readings from Last 3 Encounters:  01/19/22 133 lb 6.4 oz (60.5 kg)  01/10/22 133 lb 3.2 oz (60.4 kg)  11/24/21 134 lb (60.8 kg)    Lab Results  Component Value Date   WBC 5.0 11/24/2021   HGB 13.1 11/24/2021   HCT 40.2 11/24/2021   PLT 359.0 11/24/2021   GLUCOSE 125 (H) 01/10/2022   CHOL 144 11/24/2021   TRIG 183.0 (H) 11/24/2021   HDL 48.90 11/24/2021   LDLDIRECT 54.0 09/05/2018   LDLCALC 58 11/24/2021   ALT 18 08/12/2021   AST 24 08/12/2021   NA 141 01/10/2022   K 3.8 01/10/2022   CL 104 01/10/2022   CREATININE 1.01 (H) 01/10/2022   BUN 16 01/10/2022   CO2 25 01/10/2022   TSH 3.12 11/24/2021   INR 2.6 01/19/2022  HGBA1C 6.6 (H) 11/24/2021    Assessment/Plan:  Chronic combined systolic and diastolic CHF (congestive heart failure) (HCC) -Euvolemic -Continue current medications including Coreg 3.125 mg twice daily, Entresto 24-26 mg BID, and Jardiance 10 mg.  Essential hypertension -Controlled -Continue Coreg 3.125 mg twice daily -Lifestyle modifications  Controlled type 2 diabetes mellitus with complication, without long-term current use of insulin (HCC) -hgb A1C 6.6.% on 11/24/21 -Discussed the importance of lifestyle modifications -Recheck A1c in 3 months -Continue  Jardiance  Long term current use of anticoagulant therapy -CAD s/p PCI, h/o STEMI, Aortic valve replacement -Continue Coumadin  Pure triglyceridemia -Statin and Repatha intolerance -Continue lifestyle modifications -Continue follow-up cardiology  F/u in 3-6 months, sooner if needed.  [name], MD

## 2022-01-19 NOTE — Patient Instructions (Addendum)
[email or web address]-the-fast-[email or web address]-[email or web address]

## 2022-02-01 DIAGNOSIS — H53143 Visual discomfort, bilateral: Secondary | ICD-10-CM | POA: Diagnosis not present

## 2022-02-01 DIAGNOSIS — H31002 Unspecified chorioretinal scars, left eye: Secondary | ICD-10-CM | POA: Diagnosis not present

## 2022-02-01 DIAGNOSIS — H524 Presbyopia: Secondary | ICD-10-CM | POA: Diagnosis not present

## 2022-02-01 DIAGNOSIS — H04123 Dry eye syndrome of bilateral lacrimal glands: Secondary | ICD-10-CM | POA: Diagnosis not present

## 2022-02-01 DIAGNOSIS — R7303 Prediabetes: Secondary | ICD-10-CM | POA: Diagnosis not present

## 2022-02-01 DIAGNOSIS — H5213 Myopia, bilateral: Secondary | ICD-10-CM | POA: Diagnosis not present

## 2022-02-01 DIAGNOSIS — H52223 Regular astigmatism, bilateral: Secondary | ICD-10-CM | POA: Diagnosis not present

## 2022-02-01 DIAGNOSIS — H25013 Cortical age-related cataract, bilateral: Secondary | ICD-10-CM | POA: Diagnosis not present

## 2022-02-06 ENCOUNTER — Encounter: Payer: Self-pay | Admitting: Family Medicine

## 2022-02-06 DIAGNOSIS — Z789 Other specified health status: Secondary | ICD-10-CM | POA: Insufficient documentation

## 2022-02-09 ENCOUNTER — Telehealth: Payer: Self-pay | Admitting: Family Medicine

## 2022-02-09 NOTE — Telephone Encounter (Signed)
Left message for patient to call back and schedule Medicare Annual Wellness Visit (AWV) either virtually or in office. Left  my Amanda Wiggins number [phone]   Last AWV  11/05/20 please schedule with Nurse Health Adviser   45 min for awv-i and in office appointments 30 min for awv-s  phone/virtual appointments

## 2022-02-15 ENCOUNTER — Other Ambulatory Visit: Payer: Self-pay | Admitting: Hematology & Oncology

## 2022-02-15 DIAGNOSIS — Z853 Personal history of malignant neoplasm of breast: Secondary | ICD-10-CM

## 2022-02-16 DIAGNOSIS — H903 Sensorineural hearing loss, bilateral: Secondary | ICD-10-CM | POA: Diagnosis not present

## 2022-02-24 ENCOUNTER — Other Ambulatory Visit: Payer: Self-pay | Admitting: Cardiovascular Disease

## 2022-02-24 DIAGNOSIS — Z7901 Long term (current) use of anticoagulants: Secondary | ICD-10-CM

## 2022-02-24 DIAGNOSIS — Z952 Presence of prosthetic heart valve: Secondary | ICD-10-CM

## 2022-02-24 NOTE — Telephone Encounter (Signed)
Last OV 01/10/2022 Last INR 01/19/2022

## 2022-03-02 ENCOUNTER — Ambulatory Visit: Payer: Medicare PPO | Attending: Cardiovascular Disease | Admitting: *Deleted

## 2022-03-02 DIAGNOSIS — Z952 Presence of prosthetic heart valve: Secondary | ICD-10-CM

## 2022-03-02 DIAGNOSIS — Z5181 Encounter for therapeutic drug level monitoring: Secondary | ICD-10-CM

## 2022-03-02 LAB — POCT INR: INR: 1.8 — AB (ref 2.0–3.0)

## 2022-03-02 NOTE — Patient Instructions (Signed)
Description   Today take 1 tablet then continue taking warfarin 1/2 tablet daily except 1 tablet on Sundays, Tuesdays and Thursdays. Recheck in 5 weeks. Coumadin Clinic [phone]

## 2022-03-08 ENCOUNTER — Telehealth: Payer: Self-pay | Admitting: Pharmacist

## 2022-03-08 MED ORDER — PRALUENT 75 MG/ML ~~LOC~~ SOAJ: 1.0000 "pen " | SUBCUTANEOUS | 3 refills | Status: DC

## 2022-03-08 NOTE — Telephone Encounter (Signed)
PA for Praluent extended to 03/07/23. Pt made aware. New rx sent to pharmacy.

## 2022-03-09 NOTE — Telephone Encounter (Addendum)
Tier exception submitted for Praluent. Key: [id number]

## 2022-03-10 NOTE — Telephone Encounter (Signed)
Tier exception approved through 03/07/23.

## 2022-03-17 ENCOUNTER — Other Ambulatory Visit: Payer: Self-pay | Admitting: Hematology & Oncology

## 2022-03-17 DIAGNOSIS — Z853 Personal history of malignant neoplasm of breast: Secondary | ICD-10-CM

## 2022-03-19 DIAGNOSIS — J029 Acute pharyngitis, unspecified: Secondary | ICD-10-CM | POA: Diagnosis not present

## 2022-03-19 DIAGNOSIS — Z6824 Body mass index (BMI) 24.0-24.9, adult: Secondary | ICD-10-CM | POA: Diagnosis not present

## 2022-03-20 ENCOUNTER — Other Ambulatory Visit: Payer: Self-pay | Admitting: Cardiovascular Disease

## 2022-03-21 ENCOUNTER — Other Ambulatory Visit: Payer: Self-pay | Admitting: Cardiovascular Disease

## 2022-03-21 MED ORDER — MOLNUPIRAVIR 200 MG PO CAPS: 4.0000 | ORAL_CAPSULE | Freq: Two times a day (BID) | ORAL | 0 refills | Status: AC

## 2022-03-28 ENCOUNTER — Other Ambulatory Visit: Payer: Self-pay | Admitting: Cardiovascular Disease

## 2022-03-28 ENCOUNTER — Ambulatory Visit: Payer: Medicare PPO | Admitting: Family Medicine

## 2022-03-29 DIAGNOSIS — H6123 Impacted cerumen, bilateral: Secondary | ICD-10-CM | POA: Diagnosis not present

## 2022-03-29 DIAGNOSIS — Z974 Presence of external hearing-aid: Secondary | ICD-10-CM | POA: Diagnosis not present

## 2022-04-01 ENCOUNTER — Telehealth: Payer: Self-pay | Admitting: Family Medicine

## 2022-04-01 NOTE — Telephone Encounter (Signed)
Left message for patient to call back and schedule Medicare Annual Wellness Visit (AWV) either virtually or in office. Left  my [name] number [phone]   Last AWV 11/05/20 please schedule with Nurse Health Adviser   45 min for awv-i  in office appointments 30 min for awv-s & awv-i phone/virtual appointments

## 2022-04-08 ENCOUNTER — Ambulatory Visit: Payer: Medicare PPO | Attending: Cardiovascular Disease | Admitting: *Deleted

## 2022-04-08 DIAGNOSIS — Z5181 Encounter for therapeutic drug level monitoring: Secondary | ICD-10-CM | POA: Diagnosis not present

## 2022-04-08 DIAGNOSIS — Z952 Presence of prosthetic heart valve: Secondary | ICD-10-CM | POA: Diagnosis not present

## 2022-04-08 LAB — POCT INR: INR: 2.2 (ref 2.0–3.0)

## 2022-04-08 NOTE — Patient Instructions (Signed)
Description   Continue taking warfarin 1/2 tablet daily except 1 tablet on Sundays, Tuesdays and Thursdays. Recheck in 6 weeks. Coumadin Clinic [phone]

## 2022-04-12 ENCOUNTER — Other Ambulatory Visit: Payer: Medicare PPO

## 2022-04-12 ENCOUNTER — Ambulatory Visit: Payer: Medicare PPO | Admitting: Nurse Practitioner

## 2022-04-13 NOTE — Progress Notes (Unsigned)
Cardiology Office Note:    Date:  04/14/2022   ID:  KELSEA MOUSEL, DOB 1946/01/05, MRN [id number]  PCP:  Billie Ruddy, MD   [facility] Providers Cardiologist:  Mertie Moores, MD     Referring MD: Billie Ruddy, MD   Chief Complaint: follow-up CHF  History of Present Illness:    Amanda Wiggins is a very pleasant 77 y.o. female with a hx of bicuspid aortic valve s/p AVR on chronic anticoagulation with Coumadin, chronic combined CHF, CAD s/p anterior STEMI with PCI/DES to mLAD and BMS to dLAD 2017, HLD, breast cancer  History of mechanical AVR 2003?  In March 2017, hospitalization for acute anterior wall STEMI.  She had stenting of her mid LAD.  She had recurrent pain on the day of her original discharge and was taken back to the Cath Lab with a second stent placed. She continued to have intermittent episodes of chest discomfort and persistent ST segment elevation. LV systolic function moderately depressed with a EF 40 to 45%. She had akinesis of distal anterior wall and apex.   Has maintained consistent follow-up. Had myopathy with statin and felt better once starting on Praluent.  Bruises easily on Coumadin and Plavix.  ER visit 03/2018 for chest pain.  Reported an achiness underneath both breast for about a week.  Workup was reassuring.  2D echo 11/2019 revealed reduced LV function with EF 30 to 35%.  Initially did not tolerate Entresto due to BP, but it was retried and she felt better once starting it.  Had some falls, worked with PT and got stronger.  Most recent echo 12/09/2020 with LVEF 30 to 35%, G1 DD, moderately reduced RV function, mild to moderate aortic stenosis with mean gradient 15.7 mmHg, peak gradient 27.2 mmHg, normally functioning AVR, no significant change per Dr. Acie Fredrickson.   Last cardiology clinic visit was 01/10/2022 with Dr. Acie Fredrickson at which time she was doing well. Jardiance was added for GDMT of CHF and she was advised to return in 3 months.  Today, she is here  alone for follow-up. Reports she is overall feeling well. Just returned from a beach trip with friends. Since last office visit, no negative side effects from Sab[place]. Was very frustrated in the past by elevated A1C despite 7 lb weight loss and complete reduction in simple carbohydrates. Started Jardiance shortly after that and has lost and additional 10 lbs. has follow-up in 1 month with PCP to recheck A1c. She denies chest pain, shortness of breath, lower extremity edema, fatigue, palpitations, melena, hematuria, hemoptysis, diaphoresis, weakness, presyncope, syncope, orthopnea, and PND. Is back at the gym 3 days a week. Took a 50-minute walk a few days ago. Cannot do weight lifting 2/2 cervical spine issues. No concerning symptoms with exercise. Eating a healthy diet, avoiding sugar, fats, and simple carbohydrates.   Past Medical History:  Diagnosis Date   Aortic valve replaced    Breast cancer ([place]) 2005   FHx: migraine headaches    GERD (gastroesophageal reflux disease)    Hiatal hernia    Hx Breast Cancer, IDC, Stage I 07/03/2003   Hx of breast cancer    Hyperlipidemia    Hypertension    Menopausal syndrome    Osteopenia    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 08/22/2011   Personal history of radiation therapy    Status post aortic valve repair     Past Surgical History:  Procedure Laterality Date   AORTIC VALVE REPLACEMENT  2003  BREAST BIOPSY Left 2005   BREAST LUMPECTOMY Left 2005   CARDIAC CATHETERIZATION     Ejection Fraction    CARDIAC CATHETERIZATION N/A 05/16/2015   Procedure: Left Heart Cath and Coronary Angiography;  Surgeon: Leonie Man, MD;  Location: [facility];  Service: Cardiovascular;  Laterality: N/A;   CARDIAC CATHETERIZATION  05/16/2015   Procedure: Coronary Stent Intervention;  Surgeon: Leonie Man, MD;  Location: [facility];  Service: Cardiovascular;;   CARDIAC CATHETERIZATION N/A 05/19/2015   Procedure: Left Heart Cath and  Coronary Angiography;  Surgeon: Troy Sine, MD;  Location: [facility];  Service: Cardiovascular;  Laterality: N/A;   CARDIAC CATHETERIZATION N/A 05/19/2015   Procedure: Coronary Stent Intervention;  Surgeon: Troy Sine, MD;  Location: [facility];  Service: Cardiovascular;  Laterality: N/A;   HIATAL HERNIA REPAIR     HIATAL HERNIA REPAIR  05/11/2007   hysterectomy, endometiral cancer  2001   lummpectomy breast cancer  16/12/9602   nissan fundoplicaiton  5409   post-op hematoma on heparin    right shouler surgery      Current Medications: Current Meds  Medication Sig   Alirocumab (PRALUENT) 75 MG/ML SOAJ Inject 1 pen  into the skin every 14 (fourteen) days.   amoxicillin (AMOXIL) 500 MG capsule For dental work   BIOTIN PO Take 1 tablet by mouth every morning.   Calcium Carbonate-Vitamin D 600-400 MG-UNIT tablet Take 1 tablet by mouth 2 (two) times daily.   carvedilol (COREG) 3.125 MG tablet TAKE 1 TABLET BY MOUTH TWICE A DAY WITH A MEAL   clopidogrel (PLAVIX) 75 MG tablet Take 1 tablet (75 mg total) by mouth daily.   cycloSPORINE (RESTASIS) 0.05 % ophthalmic emulsion Place 1 drop into both eyes 2 (two) times daily as needed (DRY EYE).   Desoximetasone 0.05 % GEL APPLY TO AFFECTED AREA ONCE TO 2 TIMES DAILY   empagliflozin (JARDIANCE) 10 MG TABS tablet Take 1 tablet (10 mg total) by mouth daily before breakfast.   Glucosamine-Chondroit-Vit C-Mn (GLUCOSAMINE CHONDR 1500 COMPLX) CAPS Take 1 capsule by mouth every morning.    loratadine (CLARITIN) 10 MG tablet Take 10 mg by mouth daily.   Multiple Vitamin (MULTIVITAMIN) capsule Take 1 capsule by mouth daily.   nitroGLYCERIN (NITROSTAT) 0.4 MG SL tablet Dissolve 1 tablet under the tongue every 5 minutes as needed for chest pain. Max of 3 doses, then 911.   pantoprazole (PROTONIX) 40 MG tablet TAKE 1 TABLET BY MOUTH EVERY DAY   sacubitril-valsartan (ENTRESTO) 24-26 MG Take 1 tablet by mouth 2 (two) times daily.    temazepam (RESTORIL) 30 MG capsule TAKE 1 CAPSULE BY MOUTH AT BEDTIME AS NEEDED FOR SLEEP   warfarin (COUMADIN) 5 MG tablet TAKE AS DIRECTED BY COUMADIN CLINIC   zinc gluconate 50 MG tablet Take 50 mg by mouth daily.     Allergies:   Latex, Meperidine, Sulfa antibiotics, Crestor [rosuvastatin calcium], Pravastatin, Sulfamethoxazole, and Repatha [evolocumab]   Social History   Socioeconomic History   Marital status: Widowed    Spouse name: Not on file   Number of children: Not on file   Years of education: Not on file   Highest education level: Master's degree (e.g., MA, MS, MEng, MEd, MSW, MBA)  Occupational History   Not on file  Tobacco Use   Smoking status: Never   Smokeless tobacco: Never  Vaping Use   Vaping Use: Never used  Substance and Sexual Activity  Alcohol use: Yes    Alcohol/week: 0.0 standard drinks of alcohol    Comment: socially   Drug use: No   Sexual activity: Not on file  Other Topics Concern   Not on file  Social History Narrative   Not on file   Social Determinants of Health   Financial Resource Strain: Low Risk  (09/24/2021)   Overall Financial Resource Strain (CARDIA)    Difficulty of Paying Living Expenses: Not hard at all  Food Insecurity: No Food Insecurity (09/24/2021)   Hunger Vital Sign    Worried About Running Out of Food in the Last Year: Never true    Ran Out of Food in the Last Year: Never true  Transportation Needs: No Transportation Needs (09/24/2021)   PRAPARE - Hydrologist (Medical): No    Lack of Transportation (Non-Medical): No  Physical Activity: Insufficiently Active (09/24/2021)   Exercise Vital Sign    Days of Exercise per Week: 2 days    Minutes of Exercise per Session: 30 min  Stress: No Stress Concern Present (09/24/2021)   [place]    Feeling of Stress : Not at all  Social Connections: Moderately Integrated (09/24/2021)    Social Connection and Isolation Panel [NHANES]    Frequency of Communication with Friends and Family: More than three times a week    Frequency of Social Gatherings with Friends and Family: More than three times a week    Attends Religious Services: More than 4 times per year    Active Member of [employer] or Organizations: Yes    Attends Archivist Meetings: More than 4 times per year    Marital Status: Widowed     Family History: The patient's family history includes Heart attack (age of onset: 63) in her mother; Osteoarthritis in her brother.  ROS:   Please see the history of present illness.  All other systems reviewed and are negative.  Labs/Other Studies Reviewed:    The following studies were reviewed today:  Echo 12/09/20 1. Definity contrast was used . NO evidence of apical thrombus.      . Left ventricular ejection fraction, by estimation, is 30 to 35%. The  left ventricle has moderately decreased function. The left ventricle  demonstrates regional wall motion abnormalities (see scoring  diagram/findings for description). The left  ventricular internal cavity size was mildly dilated. Left ventricular  diastolic parameters are consistent with Grade I diastolic dysfunction  (impaired relaxation). There is severe akinesis of the left ventricular,  mid-apical anteroseptal wall, apical  segment and lateral wall.   2. Right ventricular systolic function is moderately reduced. The right  ventricular size is normal.   3. The mitral valve is normal in structure. Trivial mitral valve  regurgitation.   4. The aortic valve has been repaired/replaced. Aortic valve  regurgitation is mild. Mild to moderate aortic valve stenosis. There is a  a mechanical valve present in the aortic position.   Comparison(s): 09/03/19 EF 30-35% AV 55mHg mean PG, 267mg peak PG.   LHC 05/19/2015 Dist LAD-2 lesion, 99% stenosed. Dist LAD-1 lesion, 70% stenosed. Post intervention, there is a 0%  residual stenosis.   Widely patent stent in the mid LAD with evidence for diffuse narrowing in a very small distal LAD with muscle bridging and focal narrowing up to 99/100%.   No significant obstructive disease as noted previously in the left circumflex and the large dominant RCA.  Successful percutaneous coronary intervention to the distal LAD treated initially with up to 6 minute inflations with a 1.520 mm balloon, and ultimate stenting with a 2.023 mm mini vision stent with these entire region of stenoses being reduced to 0% and resolution of prior systolic bridging.  During the procedure the patient received numerous doses of intracoronary verapamil and nitroglycerin was started on an intravenous nitroglycerin drip.   RECOMMENDATION: The patient should be maintained on IV nitroglycerin and ultimately consider initiation of low-dose oral nitrates.  Coumadin will be held today.  INR will need to be rechecked prior to sheath removal.     Recent Labs: 08/12/2021: ALT 18 11/24/2021: Hemoglobin 13.1; Magnesium 1.9; Platelets 359.0; TSH 3.12 01/10/2022: BUN 16; Creatinine, Ser 1.01; Potassium 3.8; Sodium 141  Recent Lipid Panel    Component Value Date/Time   CHOL 144 11/24/2021 1000   CHOL 144 07/09/2020 1152   TRIG 183.0 (H) 11/24/2021 1000   HDL 48.90 11/24/2021 1000   HDL 53 07/09/2020 1152   CHOLHDL 3 11/24/2021 1000   VLDL 36.6 11/24/2021 1000   LDLCALC 58 11/24/2021 1000   LDLCALC 61 07/09/2020 1152   LDLDIRECT 54.0 09/05/2018 0903     Risk Assessment/Calculations:         Physical Exam:    VS:  BP 100/70   Pulse (!) 53   Ht '5\' 1"'$  (1.549 m)   Wt 129 lb 12.8 oz (58.9 kg)   SpO2 99%   BMI 24.53 kg/m     Wt Readings from Last 3 Encounters:  04/14/22 129 lb 12.8 oz (58.9 kg)  01/19/22 133 lb 6.4 oz (60.5 kg)  01/10/22 133 lb 3.2 oz (60.4 kg)     GEN:  Well nourished, well developed in no acute distress HEENT: Normal NECK: No JVD; No carotid bruits CARDIAC: RRR,  click of mechanical valve, no murmur. No rubs, gallops RESPIRATORY:  Clear to auscultation without rales, wheezing or rhonchi  ABDOMEN: Soft, non-tender, non-distended MUSCULOSKELETAL:  No edema; No deformity. 2+ pedal pulses, equal bilaterally SKIN: Warm and dry NEUROLOGIC:  Alert and oriented x 3 PSYCHIATRIC:  Normal affect   EKG:  EKG is not ordered today   Diagnoses:    1. Chronic combined systolic and diastolic heart failure ([place])   2. Aortic valve replaced   3. Coronary artery disease involving native coronary artery of native heart without angina pectoris   4. Hyperlipidemia LDL goal <70   5. Long term current use of anticoagulant therapy    Assessment and Plan:     Chronic combined CHF: Echo 12/2020 with LVEF 30-35%. Doing well. No dyspnea, edema, orthopnea, PND. Appears euvolemic on exam. Weight is stable. Intentional weight loss with healthy diet and increased exercise. Consideration given to adding spironolactone, however BP is soft. She agrees that she would like to wait to add another medication if possible. Continue carvedilol, Entresto, Jardiance.  CAD without angina: S/p anterior STEMI with PCI/DES to mLAD and BMS to dLAD 2017. He denies chest pain, dyspnea, or other symptoms concerning for angina. No indication for further ischemic evaluation at this time.  No bleeding concerns.  Continue Plavix.   Hyperlipidemia LDL goal < 70: LDL 58 on 11/24/2021. Well controlled on Praluent. Verified with Pharm D that there is no correlation with increase in blood sugar on Praluent.   AVR on chronic anticoagulation: Hx mechanical AVR. Mild to moderate aortic stenosis on echo 10/22.  She is doing well, is very active and has no concerning symptoms.  INR is followed by our Coumadin clinic, no concerns. Will have her follow-up in 6 months.      Disposition: 6 months with Dr. Acie Fredrickson or me  Medication Adjustments/Labs and Tests Ordered: Current medicines are reviewed at length with the  patient today.  Concerns regarding medicines are outlined above.  No orders of the defined types were placed in this encounter.  No orders of the defined types were placed in this encounter.   Patient Instructions  Medication Instructions:   Your physician recommends that you continue on your current medications as directed. Please refer to the Current Medication list given to you today.   *If you need a refill on your cardiac medications before your next appointment, please call your pharmacy*  Lab Work:  TODAY!!!! BMET  If you have labs (blood work) drawn today and your tests are completely normal, you will receive your results only by: [place] (if you have MyChart) OR A paper copy in the mail If you have any lab test that is abnormal or we need to change your treatment, we will call you to review the results.   Testing/Procedures:  None ordered.   Follow-Up: At [facility], you and your health needs are our priority.  As part of our continuing mission to provide you with exceptional heart care, we have created designated Provider Care Teams.  These Care Teams include your primary Cardiologist (physician) and Advanced Practice Providers (APPs -  Physician Assistants and Nurse Practitioners) who all work together to provide you with the care you need, when you need it.  We recommend signing up for the patient portal called "MyChart".  Sign up information is provided on this After Visit Summary.  MyChart is used to connect with patients for Virtual Visits (Telemedicine).  Patients are able to view lab/test results, encounter notes, upcoming appointments, etc.  Non-urgent messages can be sent to your provider as well.   To learn more about what you can do with MyChart, go to [email or web address].    Your next appointment:   6 month(s)  Provider:   Mertie Moores, MD        Signed, Emmaline Life, NP  04/14/2022 1:19 PM    [place]

## 2022-04-14 ENCOUNTER — Encounter: Payer: Self-pay | Admitting: Nurse Practitioner

## 2022-04-14 ENCOUNTER — Ambulatory Visit: Payer: Medicare PPO | Attending: Nurse Practitioner | Admitting: Nurse Practitioner

## 2022-04-14 ENCOUNTER — Ambulatory Visit: Payer: Medicare PPO

## 2022-04-14 VITALS — BP 100/70 | HR 53 | Ht 61.0 in | Wt 129.8 lb

## 2022-04-14 DIAGNOSIS — Z7901 Long term (current) use of anticoagulants: Secondary | ICD-10-CM

## 2022-04-14 DIAGNOSIS — Z9861 Coronary angioplasty status: Secondary | ICD-10-CM | POA: Diagnosis not present

## 2022-04-14 DIAGNOSIS — I5042 Chronic combined systolic (congestive) and diastolic (congestive) heart failure: Secondary | ICD-10-CM | POA: Diagnosis not present

## 2022-04-14 DIAGNOSIS — Z952 Presence of prosthetic heart valve: Secondary | ICD-10-CM | POA: Diagnosis not present

## 2022-04-14 DIAGNOSIS — I2510 Atherosclerotic heart disease of native coronary artery without angina pectoris: Secondary | ICD-10-CM

## 2022-04-14 DIAGNOSIS — E785 Hyperlipidemia, unspecified: Secondary | ICD-10-CM | POA: Diagnosis not present

## 2022-04-14 LAB — BASIC METABOLIC PANEL
BUN/Creatinine Ratio: 23 (ref 12–28)
BUN: 19 mg/dL (ref 8–27)
CO2: 25 mmol/L (ref 20–29)
Calcium: 9.7 mg/dL (ref 8.7–10.3)
Chloride: 100 mmol/L (ref 96–106)
Creatinine, Ser: 0.83 mg/dL (ref 0.57–1.00)
Glucose: 105 mg/dL — ABNORMAL HIGH (ref 70–99)
Potassium: 4.7 mmol/L (ref 3.5–5.2)
Sodium: 138 mmol/L (ref 134–144)
eGFR: 73 mL/min/{1.73_m2} (ref >59)

## 2022-04-14 NOTE — Patient Instructions (Signed)
Medication Instructions:   Your physician recommends that you continue on your current medications as directed. Please refer to the Current Medication list given to you today.   *If you need a refill on your cardiac medications before your next appointment, please call your pharmacy*  Lab Work:  TODAY!!!! BMET  If you have labs (blood work) drawn today and your tests are completely normal, you will receive your results only by: [place] (if you have MyChart) OR A paper copy in the mail If you have any lab test that is abnormal or we need to change your treatment, we will call you to review the results.   Testing/Procedures:  None ordered.   Follow-Up: At [facility], you and your health needs are our priority.  As part of our continuing mission to provide you with exceptional heart care, we have created designated Provider Care Teams.  These Care Teams include your primary Cardiologist (physician) and Advanced Practice Providers (APPs -  Physician Assistants and Nurse Practitioners) who all work together to provide you with the care you need, when you need it.  We recommend signing up for the patient portal called "MyChart".  Sign up information is provided on this After Visit Summary.  MyChart is used to connect with patients for Virtual Visits (Telemedicine).  Patients are able to view lab/test results, encounter notes, upcoming appointments, etc.  Non-urgent messages can be sent to your provider as well.   To learn more about what you can do with MyChart, go to [email or web address].    Your next appointment:   6 month(s)  Provider:   Mertie Moores, MD

## 2022-04-15 ENCOUNTER — Ambulatory Visit: Payer: Medicare PPO | Admitting: Nurse Practitioner

## 2022-04-15 ENCOUNTER — Other Ambulatory Visit: Payer: Medicare PPO

## 2022-04-15 ENCOUNTER — Telehealth: Payer: Self-pay | Admitting: Cardiovascular Disease

## 2022-04-15 DIAGNOSIS — E785 Hyperlipidemia, unspecified: Secondary | ICD-10-CM

## 2022-04-15 DIAGNOSIS — I5042 Chronic combined systolic (congestive) and diastolic (congestive) heart failure: Secondary | ICD-10-CM

## 2022-04-15 DIAGNOSIS — I2510 Atherosclerotic heart disease of native coronary artery without angina pectoris: Secondary | ICD-10-CM

## 2022-04-15 DIAGNOSIS — Z7901 Long term (current) use of anticoagulants: Secondary | ICD-10-CM

## 2022-04-15 MED ORDER — EMPAGLIFLOZIN 10 MG PO TABS: 10.0000 mg | ORAL_TABLET | Freq: Every day | ORAL | 3 refills | Status: DC

## 2022-04-15 NOTE — Telephone Encounter (Signed)
From Christen Bame, NP to Acie Fredrickson, MD via secure chat 04/15/22:  Kermit Balo morning, I saw Amanda Wiggins yesterday. She would like #90 refill of her Jardiance - needed to make sure kidney fx was good before sending. Will you ask Amanda Wiggins or whoever is covering you to send that today    Per verbal order in clinic-okay to send #90 days with refills to pharmacy on file. Medication sent in at this time.

## 2022-04-17 ENCOUNTER — Other Ambulatory Visit: Payer: Self-pay | Admitting: Hematology & Oncology

## 2022-04-17 DIAGNOSIS — Z853 Personal history of malignant neoplasm of breast: Secondary | ICD-10-CM

## 2022-04-24 ENCOUNTER — Other Ambulatory Visit: Payer: Self-pay | Admitting: Cardiovascular Disease

## 2022-04-26 NOTE — Telephone Encounter (Signed)
Pt last seen by Christen Bame, NP on 04/14/22 with no med changes. Pt up to date on appointments, will send refill in at this time for requested Pantoprazole.

## 2022-05-05 DIAGNOSIS — M79641 Pain in right hand: Secondary | ICD-10-CM | POA: Diagnosis not present

## 2022-05-05 DIAGNOSIS — M65312 Trigger thumb, left thumb: Secondary | ICD-10-CM | POA: Diagnosis not present

## 2022-05-05 DIAGNOSIS — M1812 Unilateral primary osteoarthritis of first carpometacarpal joint, left hand: Secondary | ICD-10-CM | POA: Diagnosis not present

## 2022-05-16 ENCOUNTER — Other Ambulatory Visit: Payer: Self-pay | Admitting: Hematology & Oncology

## 2022-05-16 DIAGNOSIS — Z853 Personal history of malignant neoplasm of breast: Secondary | ICD-10-CM

## 2022-05-17 ENCOUNTER — Ambulatory Visit: Payer: Medicare PPO | Attending: Cardiovascular Disease | Admitting: *Deleted

## 2022-05-17 DIAGNOSIS — Z5181 Encounter for therapeutic drug level monitoring: Secondary | ICD-10-CM | POA: Diagnosis not present

## 2022-05-17 DIAGNOSIS — Z952 Presence of prosthetic heart valve: Secondary | ICD-10-CM | POA: Diagnosis not present

## 2022-05-17 LAB — POCT INR: INR: 2.4 (ref 2.0–3.0)

## 2022-05-17 NOTE — Patient Instructions (Signed)
Description   Continue taking warfarin 1/2 tablet daily except 1 tablet on Sundays, Tuesdays and Thursdays. Recheck in 6 weeks. [facility] [phone]     

## 2022-05-24 ENCOUNTER — Telehealth: Payer: Self-pay | Admitting: Family Medicine

## 2022-05-24 NOTE — Telephone Encounter (Signed)
Called patient to schedule Medicare Annual Wellness Visit (AWV). Left message for patient to call back and schedule Medicare Annual Wellness Visit (AWV).  Last date of AWV: 11/05/20  Please schedule an appointment at any time with [facility] or [employer].  If any questions, please contact me at [phone].  Thank you ,  Barkley Boards AWV direct phone # [phone]   Several message have been left  lm 04/01/22  02/09/22  12/30/21   11/03/21  cpe 9/20 *DUE 11/2021

## 2022-05-30 ENCOUNTER — Ambulatory Visit: Payer: Medicare PPO | Admitting: Family Medicine

## 2022-05-30 ENCOUNTER — Encounter: Payer: Self-pay | Admitting: Family Medicine

## 2022-05-30 VITALS — BP 114/80 | HR 51 | Temp 98.1°F | Ht 61.0 in | Wt 127.6 lb

## 2022-05-30 DIAGNOSIS — I5042 Chronic combined systolic (congestive) and diastolic (congestive) heart failure: Secondary | ICD-10-CM

## 2022-05-30 DIAGNOSIS — Z7984 Long term (current) use of oral hypoglycemic drugs: Secondary | ICD-10-CM | POA: Diagnosis not present

## 2022-05-30 DIAGNOSIS — H903 Sensorineural hearing loss, bilateral: Secondary | ICD-10-CM

## 2022-05-30 DIAGNOSIS — E118 Type 2 diabetes mellitus with unspecified complications: Secondary | ICD-10-CM | POA: Diagnosis not present

## 2022-05-30 LAB — POCT GLYCOSYLATED HEMOGLOBIN (HGB A1C): Hemoglobin A1C: 5.7 % — AB (ref 4.0–5.6)

## 2022-05-30 NOTE — Progress Notes (Signed)
   Established Patient Office Visit   Subjective  Patient ID: Amanda Wiggins, female    DOB: 1945/05/09  Age: 77 y.o. MRN: [id number]  Chief Complaint  Patient presents with  . Medical Management of Chronic Issues    Follow up on high blood glucose. Pt reports her Cardiologist started her on Jardiance.     Pt is a 77 yo female  Got hearing aids which have made a huge difference.  Notes wax blocks ear canals more frequentky.  'having ears cleaned regularly at [facility] office.  Notes occasionally feeling like she has no energy.  Denies dizziness.  BP 90s-1teens/59-71   {History (Optional):23778}  ROS Negative unless stated above    Objective:     BP 114/80 (BP Location: Left Arm, Patient Position: Sitting, Cuff Size: Normal)   Pulse (!) 51   Temp 98.1 F (36.7 C) (Oral)   Ht 5\' 1"  (1.549 m)   Wt 127 lb 9.6 oz (57.9 kg)   SpO2 98%   BMI 24.11 kg/m  {Vitals History (Optional):23777}  Physical Exam Constitutional:      General: She is not in acute distress.    Appearance: Normal appearance.  HENT:     Head: Normocephalic and atraumatic.     Nose: Nose normal.     Mouth/Throat:     Mouth: Mucous membranes are moist.  Cardiovascular:     Rate and Rhythm: Normal rate and regular rhythm.     Heart sounds: Normal heart sounds. No murmur heard.    No gallop.  Pulmonary:     Effort: Pulmonary effort is normal. No respiratory distress.     Breath sounds: Normal breath sounds. No wheezing, rhonchi or rales.  Skin:    General: Skin is warm and dry.  Neurological:     Mental Status: She is alert and oriented to person, place, and time.     Results for orders placed or performed in visit on 05/30/22  POC HgB A1c  Result Value Ref Range   Hemoglobin A1C 5.7 (A) 4.0 - 5.6 %   HbA1c POC (<> result, manual entry)     HbA1c, POC (prediabetic range)     HbA1c, POC (controlled diabetic range)        Assessment & Plan:  Controlled type 2 diabetes mellitus with  complication, without long-term current use of insulin (HCC) -     POCT glycosylated hemoglobin (Hb A1C)  Chronic combined systolic and diastolic CHF (congestive heart failure) (HCC)  Sensorineural hearing loss (SNHL) of both ears    No follow-ups on file.   Deeann Saint, MD

## 2022-06-17 ENCOUNTER — Other Ambulatory Visit: Payer: Self-pay | Admitting: Hematology & Oncology

## 2022-06-17 DIAGNOSIS — Z853 Personal history of malignant neoplasm of breast: Secondary | ICD-10-CM

## 2022-06-28 ENCOUNTER — Ambulatory Visit: Payer: Medicare PPO | Attending: Cardiovascular Disease

## 2022-06-28 DIAGNOSIS — Z952 Presence of prosthetic heart valve: Secondary | ICD-10-CM | POA: Diagnosis not present

## 2022-06-28 DIAGNOSIS — Z5181 Encounter for therapeutic drug level monitoring: Secondary | ICD-10-CM

## 2022-06-28 DIAGNOSIS — H6123 Impacted cerumen, bilateral: Secondary | ICD-10-CM | POA: Diagnosis not present

## 2022-06-28 LAB — POCT INR: INR: 1.8 — AB (ref 2.0–3.0)

## 2022-06-28 NOTE — Patient Instructions (Signed)
Description   Take 1.5 tablets today, then resume same dosage of Warfarin 1/2 tablet daily except 1 tablet on Sundays, Tuesdays and Thursdays. Recheck in 4 weeks. Coumadin Clinic [phone]

## 2022-07-19 ENCOUNTER — Other Ambulatory Visit: Payer: Self-pay | Admitting: Hematology & Oncology

## 2022-07-19 ENCOUNTER — Telehealth: Payer: Self-pay | Admitting: Family Medicine

## 2022-07-19 DIAGNOSIS — Z853 Personal history of malignant neoplasm of breast: Secondary | ICD-10-CM

## 2022-07-19 NOTE — Telephone Encounter (Signed)
Called patient to schedule Medicare Annual Wellness Visit (AWV). Left message for patient to call back and schedule Medicare Annual Wellness Visit (AWV).  Last date of AWV: 11/05/20  Please schedule an appointment at any time with [facility] or [employer].  If any questions, please contact me at [phone].  Thank you ,  Rudell Cobb AWV direct phone # [phone]

## 2022-07-26 ENCOUNTER — Ambulatory Visit: Payer: Medicare PPO

## 2022-07-27 ENCOUNTER — Ambulatory Visit: Payer: Medicare PPO | Attending: Cardiovascular Disease | Admitting: *Deleted

## 2022-07-27 DIAGNOSIS — Z5181 Encounter for therapeutic drug level monitoring: Secondary | ICD-10-CM | POA: Diagnosis not present

## 2022-07-27 DIAGNOSIS — Z952 Presence of prosthetic heart valve: Secondary | ICD-10-CM | POA: Diagnosis not present

## 2022-07-27 LAB — POCT INR: INR: 2.1 (ref 2.0–3.0)

## 2022-07-27 NOTE — Patient Instructions (Addendum)
Description   Today take 1 tablet of warfarin then continue taking Warfarin 1/2 tablet daily except 1 tablet on Sundays, Tuesdays and Thursdays. Recheck in 4 weeks. Coumadin Clinic [phone] or [phone]

## 2022-08-08 ENCOUNTER — Other Ambulatory Visit: Payer: Self-pay | Admitting: Hematology & Oncology

## 2022-08-08 DIAGNOSIS — Z1231 Encounter for screening mammogram for malignant neoplasm of breast: Secondary | ICD-10-CM

## 2022-08-12 ENCOUNTER — Inpatient Hospital Stay: Payer: Medicare PPO

## 2022-08-12 ENCOUNTER — Inpatient Hospital Stay: Payer: Medicare PPO | Attending: Hematology & Oncology

## 2022-08-12 ENCOUNTER — Encounter: Payer: Self-pay | Admitting: Hematology & Oncology

## 2022-08-12 ENCOUNTER — Inpatient Hospital Stay: Payer: Medicare PPO | Admitting: Hematology & Oncology

## 2022-08-12 VITALS — BP 84/60 | HR 64 | Temp 98.2°F | Resp 20 | Ht 61.0 in | Wt 124.0 lb

## 2022-08-12 VITALS — BP 85/60 | HR 51 | Resp 18

## 2022-08-12 DIAGNOSIS — Z853 Personal history of malignant neoplasm of breast: Secondary | ICD-10-CM

## 2022-08-12 DIAGNOSIS — Z7901 Long term (current) use of anticoagulants: Secondary | ICD-10-CM | POA: Insufficient documentation

## 2022-08-12 DIAGNOSIS — Z885 Allergy status to narcotic agent status: Secondary | ICD-10-CM | POA: Diagnosis not present

## 2022-08-12 DIAGNOSIS — Z08 Encounter for follow-up examination after completed treatment for malignant neoplasm: Secondary | ICD-10-CM

## 2022-08-12 DIAGNOSIS — Z923 Personal history of irradiation: Secondary | ICD-10-CM | POA: Diagnosis not present

## 2022-08-12 DIAGNOSIS — M818 Other osteoporosis without current pathological fracture: Secondary | ICD-10-CM | POA: Insufficient documentation

## 2022-08-12 DIAGNOSIS — T386X5D Adverse effect of antigonadotrophins, antiestrogens, antiandrogens, not elsewhere classified, subsequent encounter: Secondary | ICD-10-CM | POA: Insufficient documentation

## 2022-08-12 DIAGNOSIS — Z882 Allergy status to sulfonamides status: Secondary | ICD-10-CM | POA: Diagnosis not present

## 2022-08-12 DIAGNOSIS — Z952 Presence of prosthetic heart valve: Secondary | ICD-10-CM | POA: Insufficient documentation

## 2022-08-12 LAB — CBC WITH DIFFERENTIAL (CANCER CENTER ONLY)
Abs Immature Granulocytes: 0.02 10*3/uL (ref 0.00–0.07)
Basophils Absolute: 0 10*3/uL (ref 0.0–0.1)
Basophils Relative: 0 %
Eosinophils Absolute: 0.2 10*3/uL (ref 0.0–0.5)
Eosinophils Relative: 3 %
HCT: 43.4 % (ref 36.0–46.0)
Hemoglobin: 13.6 g/dL (ref 12.0–15.0)
Immature Granulocytes: 0 %
Lymphocytes Relative: 22 %
Lymphs Abs: 1.2 10*3/uL (ref 0.7–4.0)
MCH: 28.3 pg (ref 26.0–34.0)
MCHC: 31.3 g/dL (ref 30.0–36.0)
MCV: 90.2 fL (ref 80.0–100.0)
Monocytes Absolute: 0.5 10*3/uL (ref 0.1–1.0)
Monocytes Relative: 10 %
Neutro Abs: 3.5 10*3/uL (ref 1.7–7.7)
Neutrophils Relative %: 65 %
Platelet Count: 353 10*3/uL (ref 150–400)
RBC: 4.81 MIL/uL (ref 3.87–5.11)
RDW: 13.2 % (ref 11.5–15.5)
WBC Count: 5.5 10*3/uL (ref 4.0–10.5)
nRBC: 0 % (ref 0.0–0.2)

## 2022-08-12 LAB — CMP (CANCER CENTER ONLY)
ALT: 12 U/L (ref 0–44)
AST: 22 U/L (ref 15–41)
Albumin: 4.4 g/dL (ref 3.5–5.0)
Alkaline Phosphatase: 47 U/L (ref 38–126)
Anion gap: 5 (ref 5–15)
BUN: 25 mg/dL — ABNORMAL HIGH (ref 8–23)
CO2: 30 mmol/L (ref 22–32)
Calcium: 10.1 mg/dL (ref 8.9–10.3)
Chloride: 107 mmol/L (ref 98–111)
Creatinine: 1.17 mg/dL — ABNORMAL HIGH (ref 0.44–1.00)
GFR, Estimated: 49 mL/min — ABNORMAL LOW (ref >60)
Glucose, Bld: 162 mg/dL — ABNORMAL HIGH (ref 70–99)
Potassium: 4.7 mmol/L (ref 3.5–5.1)
Sodium: 142 mmol/L (ref 135–145)
Total Bilirubin: 0.4 mg/dL (ref 0.3–1.2)
Total Protein: 6.9 g/dL (ref 6.5–8.1)

## 2022-08-12 LAB — LACTATE DEHYDROGENASE: LDH: 196 U/L — ABNORMAL HIGH (ref 98–192)

## 2022-08-12 MED ORDER — ZOLEDRONIC ACID 4 MG/100ML IV SOLN
4.0000 mg | Freq: Once | INTRAVENOUS | Status: AC
  Administered 2022-08-12: 4 mg via INTRAVENOUS
  Filled 2022-08-12: qty 100

## 2022-08-12 MED ORDER — SODIUM CHLORIDE 0.9 % IV SOLN: INTRAVENOUS | Status: DC

## 2022-08-12 NOTE — Patient Instructions (Signed)

## 2022-08-12 NOTE — Progress Notes (Signed)
Hematology and Oncology Follow Up Visit  Amanda Wiggins [id number] 10/22/1945 77 y.o. 08/12/2022   Principle Diagnosis:  1. Stage I (T1 N0 M0) ductal carcinoma of the left breast. 2. Mechanical aortic valve.  Current Therapy:    Coumadin-lifelong Zometa 5 mg IV q. Year -next dose on June 2025     Interim History:  Amanda Wiggins is back for followup.  She had a wonderful time over in [place] last year.  She was over there with a stepdaughter.  She had a wonderful time.  She had great food.  She did a lot of walking.  So happy that she enjoyed her cell.  In January, she had COVID.  She just did not feel that good.  She had a sore throat.  She was put on medication for it.  Her last mammogram was back in January 2023.  Everything looks fine on the mammogram.  She is still on Coumadin.  This is for her mechanical aortic valve.  She has had no issues with nausea or vomiting.  There is been no change in bowel or bladder habits.  She has had no rashes.  There is been no bleeding.  Overall, I would say that her performance status is probably ECOG 1.    Medications:  Current Outpatient Medications:    Alirocumab (PRALUENT) 75 MG/ML SOAJ, Inject 1 pen  into the skin every 14 (fourteen) days., Disp: 6 mL, Rfl: 3   BIOTIN PO, Take 1 tablet by mouth every morning., Disp: , Rfl:    Calcium Carbonate-Vitamin D 600-400 MG-UNIT tablet, Take 1 tablet by mouth 2 (two) times daily., Disp: , Rfl:    carvedilol (COREG) 3.125 MG tablet, TAKE 1 TABLET BY MOUTH TWICE A DAY WITH A MEAL, Disp: 180 tablet, Rfl: 3   clopidogrel (PLAVIX) 75 MG tablet, Take 1 tablet (75 mg total) by mouth daily., Disp: 90 tablet, Rfl: 3   cycloSPORINE (RESTASIS) 0.05 % ophthalmic emulsion, Place 1 drop into both eyes 2 (two) times daily as needed (DRY EYE)., Disp: , Rfl:    Desoximetasone 0.05 % GEL, APPLY TO AFFECTED AREA ONCE TO 2 TIMES DAILY, Disp: 45 g, Rfl: 6   empagliflozin (JARDIANCE) 10 MG TABS tablet, Take 1 tablet (10 mg  total) by mouth daily before breakfast., Disp: 90 tablet, Rfl: 3   Glucosamine-Chondroit-Vit C-Mn (GLUCOSAMINE CHONDR 1500 COMPLX) CAPS, Take 1 capsule by mouth every morning. , Disp: , Rfl:    loratadine (CLARITIN) 10 MG tablet, Take 10 mg by mouth daily., Disp: , Rfl:    Multiple Vitamin (MULTIVITAMIN) capsule, Take 1 capsule by mouth daily., Disp: , Rfl:    pantoprazole (PROTONIX) 40 MG tablet, TAKE 1 TABLET BY MOUTH EVERY DAY, Disp: 90 tablet, Rfl: 3   sacubitril-valsartan (ENTRESTO) 24-26 MG, Take 1 tablet by mouth 2 (two) times daily., Disp: 180 tablet, Rfl: 3   temazepam (RESTORIL) 30 MG capsule, TAKE 1 CAPSULE BY MOUTH AT BEDTIME AS NEEDED FOR SLEEP, Disp: 30 capsule, Rfl: 0   warfarin (COUMADIN) 5 MG tablet, TAKE AS DIRECTED BY COUMADIN CLINIC (Patient taking differently: Take 5 mg by mouth. TAKE AS DIRECTED BY COUMADIN CLINIC 08/12/2022 Takes 5 mg 3 days a week and 2.5 mg 4 days per week.), Disp: 80 tablet, Rfl: 1   amoxicillin (AMOXIL) 500 MG capsule, For dental work (Patient not taking: Reported on 08/12/2022), Disp: , Rfl:    nitroGLYCERIN (NITROSTAT) 0.4 MG SL tablet, Dissolve 1 tablet under the tongue every 5 minutes  as needed for chest pain. Max of 3 doses, then 911. (Patient not taking: Reported on 08/12/2022), Disp: 25 tablet, Rfl: 6  Allergies:  Allergies  Allergen Reactions   Latex Hives   Meperidine Nausea Only   Sulfa Antibiotics Nausea Only   Crestor [Rosuvastatin Calcium] Other (See Comments)    Myalgias on 10mg  and 20mg  daily   Pravastatin Other (See Comments)    myalgias   Sulfamethoxazole Nausea And Vomiting   Repatha [Evolocumab] Other (See Comments)    myalgias    Past Medical History, Surgical history, Social history, and Family History were reviewed and updated.  Review of Systems: Review of Systems  Constitutional: Negative.   HENT: Negative.    Eyes: Negative.   Cardiovascular: Negative.   Gastrointestinal: Negative.   Genitourinary: Negative.    Musculoskeletal: Negative.   Skin: Negative.   Neurological: Negative.   Endo/Heme/Allergies: Negative.   Psychiatric/Behavioral: Negative.       Physical Exam:  height is 5\' 1"  (1.549 m) and weight is 124 lb (56.2 kg). Her oral temperature is 98.2 F (36.8 C). Her blood pressure is 84/60 (abnormal) and her pulse is 64. Her respiration is 20 and oxygen saturation is 99%.   Physical Exam Vitals reviewed.  HENT:     Head: Normocephalic and atraumatic.  Eyes:     Pupils: Pupils are equal, round, and reactive to light.  Cardiovascular:     Rate and Rhythm: Normal rate and regular rhythm.     Heart sounds: Normal heart sounds.  Pulmonary:     Effort: Pulmonary effort is normal.     Breath sounds: Normal breath sounds.  Abdominal:     General: Bowel sounds are normal.     Palpations: Abdomen is soft.  Musculoskeletal:        General: No tenderness or deformity. Normal range of motion.     Cervical back: Normal range of motion.  Lymphadenopathy:     Cervical: No cervical adenopathy.  Skin:    General: Skin is warm and dry.     Findings: No erythema or rash.  Neurological:     Mental Status: She is alert and oriented to person, place, and time.  Psychiatric:        Behavior: Behavior normal.        Thought Content: Thought content normal.        Judgment: Judgment normal.     Lab Results  Component Value Date   WBC 5.5 08/12/2022   HGB 13.6 08/12/2022   HCT 43.4 08/12/2022   MCV 90.2 08/12/2022   PLT 353 08/12/2022     Chemistry      Component Value Date/Time   NA 138 04/14/2022 0955   NA 147 (H) 02/27/2017 0952   NA 142 02/12/2015 1125   K 4.7 04/14/2022 0955   K 4.3 02/27/2017 0952   K 3.8 02/12/2015 1125   CL 100 04/14/2022 0955   CL 106 02/27/2017 0952   CO2 25 04/14/2022 0955   CO2 30 02/27/2017 0952   CO2 24 02/12/2015 1125   BUN 19 04/14/2022 0955   BUN 14 02/27/2017 0952   BUN 14.7 02/12/2015 1125   CREATININE 0.83 04/14/2022 0955   CREATININE  0.99 08/12/2021 0915   CREATININE 0.9 02/27/2017 0952   CREATININE 1.0 02/12/2015 1125      Component Value Date/Time   CALCIUM 9.7 04/14/2022 0955   CALCIUM 9.2 02/27/2017 0952   CALCIUM 10.2 02/12/2015 1125   ALKPHOS 50 08/12/2021 0915  ALKPHOS 55 02/27/2017 0952   ALKPHOS 56 02/12/2015 1125   AST 24 08/12/2021 0915   AST 35 (H) 02/12/2015 1125   ALT 18 08/12/2021 0915   ALT 22 02/27/2017 0952   ALT 39 02/12/2015 1125   BILITOT 0.6 08/12/2021 0915   BILITOT 0.45 02/12/2015 1125       Impression and Plan: Amanda Wiggins is 77 year old female with a history of stage I infiltrating duct carcinoma the left breast. She underwent lumpectomy.  She  had been on Femara. She had radiation. It has been 17 years now.  Everything looks good right now.  I do not see any problems from a malignant point of view.  I do not see any evidence of recurrent breast cancer.  I know that she has had a lot of expenses around the house lately.  Because of these expensive, she really is not able to do much traveling.  As always, she gets her Zometa today.  We will see her back in 1 year.   Josph Macho, MD 6/7/20249:16 AM

## 2022-08-18 ENCOUNTER — Other Ambulatory Visit: Payer: Self-pay | Admitting: Hematology & Oncology

## 2022-08-18 DIAGNOSIS — Z853 Personal history of malignant neoplasm of breast: Secondary | ICD-10-CM

## 2022-08-31 ENCOUNTER — Ambulatory Visit: Payer: Medicare PPO

## 2022-09-01 ENCOUNTER — Encounter: Payer: Self-pay | Admitting: Family Medicine

## 2022-09-02 ENCOUNTER — Other Ambulatory Visit: Payer: Self-pay | Admitting: Family Medicine

## 2022-09-02 DIAGNOSIS — R051 Acute cough: Secondary | ICD-10-CM

## 2022-09-02 DIAGNOSIS — U071 COVID-19: Secondary | ICD-10-CM

## 2022-09-02 MED ORDER — BENZONATATE 100 MG PO CAPS
100.0000 mg | ORAL_CAPSULE | Freq: Two times a day (BID) | ORAL | 0 refills | Status: AC | PRN
Start: 2022-09-02 — End: ?

## 2022-09-12 ENCOUNTER — Ambulatory Visit: Payer: Medicare PPO | Attending: Cardiovascular Disease | Admitting: *Deleted

## 2022-09-12 DIAGNOSIS — Z952 Presence of prosthetic heart valve: Secondary | ICD-10-CM | POA: Diagnosis not present

## 2022-09-12 DIAGNOSIS — Z5181 Encounter for therapeutic drug level monitoring: Secondary | ICD-10-CM | POA: Diagnosis not present

## 2022-09-12 LAB — POCT INR: INR: 2 (ref 2.0–3.0)

## 2022-09-12 NOTE — Patient Instructions (Signed)
Description   Today take 1 tablet of warfarin then continue taking Warfarin 1/2 tablet daily except 1 tablet on Sundays, Tuesdays and Thursdays. Recheck in 4 weeks. Coumadin Clinic [phone] or [phone]      

## 2022-09-16 ENCOUNTER — Other Ambulatory Visit: Payer: Self-pay | Admitting: Hematology & Oncology

## 2022-09-16 DIAGNOSIS — Z853 Personal history of malignant neoplasm of breast: Secondary | ICD-10-CM

## 2022-09-21 ENCOUNTER — Ambulatory Visit
Admission: RE | Admit: 2022-09-21 | Discharge: 2022-09-21 | Disposition: A | Discharge status: Home / Self Care | Payer: Medicare PPO | Source: Ambulatory Visit | Attending: Hematology & Oncology | Admitting: Hematology & Oncology

## 2022-09-21 DIAGNOSIS — Z1231 Encounter for screening mammogram for malignant neoplasm of breast: Secondary | ICD-10-CM

## 2022-09-28 DIAGNOSIS — H903 Sensorineural hearing loss, bilateral: Secondary | ICD-10-CM | POA: Diagnosis not present

## 2022-09-28 DIAGNOSIS — H938X3 Other specified disorders of ear, bilateral: Secondary | ICD-10-CM | POA: Diagnosis not present

## 2022-10-11 ENCOUNTER — Ambulatory Visit: Payer: Medicare PPO | Attending: Internal Medicine | Admitting: *Deleted

## 2022-10-11 DIAGNOSIS — Z5181 Encounter for therapeutic drug level monitoring: Secondary | ICD-10-CM

## 2022-10-11 DIAGNOSIS — Z952 Presence of prosthetic heart valve: Secondary | ICD-10-CM | POA: Diagnosis not present

## 2022-10-11 LAB — POCT INR: INR: 2.2 (ref 2.0–3.0)

## 2022-10-11 NOTE — Patient Instructions (Signed)
Description   Continue taking Warfarin 1/2 tablet daily except 1 tablet on Sundays, Tuesdays and Thursdays. Recheck in 5 weeks. Coumadin Clinic [phone] or [phone]

## 2022-10-16 ENCOUNTER — Encounter: Payer: Self-pay | Admitting: Cardiovascular Disease

## 2022-10-16 NOTE — Progress Notes (Unsigned)
Amanda Wiggins Date of Birth  1945-10-14 [facility] [address]    [address] [place], [place]  [address] [phone]  Fax  [phone]  Problem List 1. Aortic valve replacement 2. Hyperlipidemia 3. Breast cancer 4. CAD -  Mid LAD to Dist LAD lesion, 100% stenosed. Post intervention with a single Synergy DES 2.25 mm x 20 mm S/p mini vision stent to distal LAD   Amanda Wiggins is a 77 year old female with a history of aortic stenosis-status post aortic valve replacement.  She also has a history of hypercholesterolemia.  She's not had any episodes of chest pain or shortness of breath.  She's had her INR levels checked in our Coumadin clinic. Her INR levels have all been therapeutic.  Nov. 6 , 2014  Amanda Wiggins is doing well.  i saw her a year ago.  She is 10 years our from her breast cancer and is doing well.    No cardiac complaints.   Able to do all of her normal activities.     Nov. 13, 2015:  Amanda Wiggins is doing well.   No CP .  No dyspnea.   Has been stressful.  Her mother died this past year.   Her INR levels have been  Well controlled   Nov. 14, 2016:  Doing well.  No cardiac issues.  INR levels have been stable,  A bit low the last time .  Had an AVR 13 years ago .    June 05, 2015:  Amanda Wiggins was recently seen at the hospital for an acute anterior wall ST segment elevation myocardial infarction. She had stenting of her mid LAD. She had recurrent pain on the day of her original discharge and was taken back to the Cath Lab and had a second stent placed. She continued to have some intermittent episodes of chest discomfort. She has had persistent ST segment elevation. Her left ventricular systolic function is moderately depressed with an EF of 40-45%.   She has akinesis of the distal anterior wall and apex.   She is not having the band like chest pain that she had on presentation Has had some GERD.   Asked about going back on Omeprazole  - we discussed her need for Protonix with the  plavix .   September 01, 2015:  Amanda Wiggins is seen back for follow up visit. Still having trouble getting over her MI .  Still fatigued but is slowly getting better.   No CP.      Dec. 12, 2017:  feeling ok Does not have the energy that she wants ( and used to have prior to her MI )  BP has been in the 90-100 range.   September 21, 2016:    Amanda Wiggins is seen today for her AVR and CAD / MI  Her husband, Amanda Wiggins, died last week  Has some leg aches.   Feb. 13, 2019: Amanda Wiggins is seen back today for follow up of her AVR and CHF Echo shows LV EF of 40% , mean AV gradient is 21 mmHg.  Is on coumadin and plavix  Has occasional left shoulder pain  Had stenting of LAD in March 2017.    Has had some balance issues.   Saw ENT. Thinks it may be orthostatic hypotension  Had ears cleaned out.   Feels better  Aug. 26, 2019:  Doing well. Has CAD, AVR   No recent CP , Gets winded with little exertion EF is 40%.    Bruises quite a bit -  is on coumadin and plavix   Feb. 24, 2020:  Seen for follow up of her CAD and AVR. Continues to have balance issues.   Lists to one side. She has been going to PT .   Was thought to be due to muscle weakness possibly related to the crestor.   She is on Pralulent  And her balance and muscle strength is better.  Works out at [employer] and is doing yoga   January, 2021:  Amanda Wiggins is seen today for a follow-up visit.  She was in the emergency room last week with chest pain. The pain was described as an achiness located under both breast.  It did been there for a week.  Pain seems to be different than her angina equivalent.  She had -2 - troponins while in the emergency room and was discharged in satisfactory condition.  The pain settled in her left shoulder blade .  No dyspnea, no pleuretic cp   Sept. 2, 2021  Amanda Wiggins is seen today for follow up of her AVR, CAD, CHF Recent echo revealed reduced LV function with EF 30-35% We brought her back to start Entresto but her BP was too low to  start Entresto.   Was started on Losartan instead.  Was started on Lasix 20 mg 3 times a weeks.    Is avoiding salt and salty foods.   January 10, 2020: Amanda Wiggins is seen today for follow-up visit.  She has a history of aortic valve replacement, coronary artery disease, congestive heart failure.  Her echocardiogram shows an ejection fraction of 30 to 35%.  We have started her on losartan and lasix   She has been changed to [facility] .  Is off lasix .   Feeling better on the [facility]   Has had some left arm / neck pain   Jul 09, 2020:  Amanda Wiggins is seen today for follow up of her AVR, CAD, CHF Is on Entresto . Seems to be tolerating it well Was falling  But with PT she is getting stronger and has not fallen in a while  Is going to the gym regularly ,   INR is 1.9 today  Is tolerating the low dose entresto BP is marginal but no orthostasis   Nov. 1, 2022:  Amanda Wiggins is seen today for follow up of her AVR, CAD, CHF On entresto, LV function has not changed much - 30-35% Grade 1 DD  Mean AV gradient of 17 mmhg     Is back in the gym. Walks outside also  Has some DOE with exercise    Nov. 6, 2023  Amanda Wiggins is seen today for follow up of AVR, CAD, CHF Asked about the RSV vaccine - not able to give a comment on this .   Has lost weight by avoiding carbs  Has not been exercising much until recently  Is back in the gym 3 days a week .    Has chronic combined CHF On Coreg, Entresto   Her latest BMP revealed a potassium level of 5.2   Recent glucose was 130, HbA1C was 6.6    Aug. 12, 2024 Amanda Wiggins is seen for follow up of her AVR, CHF, CAD     Current Outpatient Medications on File Prior to Visit  Medication Sig Dispense Refill   Alirocumab (PRALUENT) 75 MG/ML SOAJ Inject 1 pen  into the skin every 14 (fourteen) days. 6 mL 3   amoxicillin (AMOXIL) 500 MG capsule For dental work (Patient not taking:  Reported on 08/12/2022)     benzonatate (TESSALON) 100 MG capsule Take 1 capsule (100 mg total)  by mouth 2 (two) times daily as needed for cough. 20 capsule 0   BIOTIN PO Take 1 tablet by mouth every morning.     Calcium Carbonate-Vitamin D 600-400 MG-UNIT tablet Take 1 tablet by mouth 2 (two) times daily.     carvedilol (COREG) 3.125 MG tablet TAKE 1 TABLET BY MOUTH TWICE A DAY WITH A MEAL 180 tablet 3   clopidogrel (PLAVIX) 75 MG tablet Take 1 tablet (75 mg total) by mouth daily. 90 tablet 3   cycloSPORINE (RESTASIS) 0.05 % ophthalmic emulsion Place 1 drop into both eyes 2 (two) times daily as needed (DRY EYE).     Desoximetasone 0.05 % GEL APPLY TO AFFECTED AREA ONCE TO 2 TIMES DAILY 45 g 6   empagliflozin (JARDIANCE) 10 MG TABS tablet Take 1 tablet (10 mg total) by mouth daily before breakfast. 90 tablet 3   Glucosamine-Chondroit-Vit C-Mn (GLUCOSAMINE CHONDR 1500 COMPLX) CAPS Take 1 capsule by mouth every morning.      loratadine (CLARITIN) 10 MG tablet Take 10 mg by mouth daily.     Multiple Vitamin (MULTIVITAMIN) capsule Take 1 capsule by mouth daily.     nitroGLYCERIN (NITROSTAT) 0.4 MG SL tablet Dissolve 1 tablet under the tongue every 5 minutes as needed for chest pain. Max of 3 doses, then 911. (Patient not taking: Reported on 08/12/2022) 25 tablet 6   pantoprazole (PROTONIX) 40 MG tablet TAKE 1 TABLET BY MOUTH EVERY DAY 90 tablet 3   sacubitril-valsartan (ENTRESTO) 24-26 MG Take 1 tablet by mouth 2 (two) times daily. 180 tablet 3   temazepam (RESTORIL) 30 MG capsule TAKE 1 CAPSULE BY MOUTH AT BEDTIME AS NEEDED FOR SLEEP 30 capsule 0   warfarin (COUMADIN) 5 MG tablet TAKE AS DIRECTED BY COUMADIN CLINIC (Patient taking differently: Take 5 mg by mouth. TAKE AS DIRECTED BY COUMADIN CLINIC 08/12/2022 Takes 5 mg 3 days a week and 2.5 mg 4 days per week.) 80 tablet 1   No current facility-administered medications on file prior to visit.    Allergies  Allergen Reactions   Latex Hives   Meperidine Nausea Only   Sulfa Antibiotics Nausea Only   Crestor [Rosuvastatin Calcium] Other (See  Comments)    Myalgias on 10mg  and 20mg  daily   Pravastatin Other (See Comments)    myalgias   Sulfamethoxazole Nausea And Vomiting   Repatha [Evolocumab] Other (See Comments)    myalgias    Past Medical History:  Diagnosis Date   Aortic valve replaced    Breast cancer (HCC) 2005   FHx: migraine headaches    GERD (gastroesophageal reflux disease)    Hiatal hernia    Hx Breast Cancer, IDC, Stage I 07/03/2003   Hx of breast cancer    Hyperlipidemia    Hypertension    Menopausal syndrome    Osteopenia    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 08/22/2011   Personal history of radiation therapy    Status post aortic valve repair     Past Surgical History:  Procedure Laterality Date   AORTIC VALVE REPLACEMENT  2003   BREAST BIOPSY Left 2005   BREAST LUMPECTOMY Left 2005   CARDIAC CATHETERIZATION     Ejection Fraction    CARDIAC CATHETERIZATION N/A 05/16/2015   Procedure: Left Heart Cath and Coronary Angiography;  Surgeon: Marykay Lex, MD;  Location[facility] INVASIVE CV LAB;  Service: Cardiovascular;  Laterality:  N/A;   CARDIAC CATHETERIZATION  05/16/2015   Procedure: Coronary Stent Intervention;  Surgeon: Marykay Lex, MD;  Location: [facility] INVASIVE CV LAB;  Service: Cardiovascular;;   CARDIAC CATHETERIZATION N/A 05/19/2015   Procedure: Left Heart Cath and Coronary Angiography;  Surgeon: Lennette Bihari, MD;  Location: [facility] INVASIVE CV LAB;  Service: Cardiovascular;  Laterality: N/A;   CARDIAC CATHETERIZATION N/A 05/19/2015   Procedure: Coronary Stent Intervention;  Surgeon: Lennette Bihari, MD;  Location: [facility] INVASIVE CV LAB;  Service: Cardiovascular;  Laterality: N/A;   HIATAL HERNIA REPAIR     HIATAL HERNIA REPAIR  05/11/2007   hysterectomy, endometiral cancer  2001   lummpectomy breast cancer  07/03/2003   nissan fundoplicaiton  2009   post-op hematoma on heparin    right shouler surgery      Social History   Tobacco Use  Smoking Status Never  Smokeless Tobacco Never     Social History   Substance and Sexual Activity  Alcohol Use Yes   Alcohol/week: 0.0 standard drinks of alcohol   Comment: socially    Family History  Problem Relation Age of Onset   Heart attack Mother 69   Osteoarthritis Brother        hpercholesterolemia    Reviw of Systems:  Noted in current history, otherwise review of systems is negative.  Physical Exam: There were no vitals taken for this visit.  No BP recorded.  {Refresh Note OR Click here to enter BP  :1}***    GEN:  Well nourished, well developed in no acute distress HEENT: Normal NECK: No JVD; No carotid bruits LYMPHATICS: No lymphadenopathy CARDIAC: RRR ***, no murmurs, rubs, gallops RESPIRATORY:  Clear to auscultation without rales, wheezing or rhonchi  ABDOMEN: Soft, non-tender, non-distended MUSCULOSKELETAL:  No edema; No deformity  SKIN: Warm and dry NEUROLOGIC:  Alert and oriented x 3     ECG:          Assessment / Plan:   1. Aortic valve replacement -            2. Hyperlipidemia -          3.  Chronic systolic/ diastolic congestive heart failure:       4. CAD  -   no angina      Kristeen Miss, MD  10/16/2022 8:36 AM    [facility] [address] [place],  [address] [place], [place]  [address] Pager [phone] Phone: [phone]; Fax: [phone]

## 2022-10-17 ENCOUNTER — Ambulatory Visit: Payer: Medicare PPO | Attending: Cardiovascular Disease | Admitting: Cardiovascular Disease

## 2022-10-17 ENCOUNTER — Other Ambulatory Visit: Payer: Self-pay | Admitting: Hematology & Oncology

## 2022-10-17 ENCOUNTER — Encounter: Payer: Self-pay | Admitting: Cardiovascular Disease

## 2022-10-17 VITALS — BP 100/58 | HR 63 | Ht 61.0 in | Wt 123.2 lb

## 2022-10-17 DIAGNOSIS — Z853 Personal history of malignant neoplasm of breast: Secondary | ICD-10-CM

## 2022-10-17 DIAGNOSIS — I5042 Chronic combined systolic (congestive) and diastolic (congestive) heart failure: Secondary | ICD-10-CM | POA: Diagnosis not present

## 2022-10-17 DIAGNOSIS — E785 Hyperlipidemia, unspecified: Secondary | ICD-10-CM

## 2022-10-17 DIAGNOSIS — Z952 Presence of prosthetic heart valve: Secondary | ICD-10-CM | POA: Diagnosis not present

## 2022-10-17 NOTE — Patient Instructions (Signed)
Medication Instructions:  Your physician recommends that you continue on your current medications as directed. Please refer to the Current Medication list given to you today.  *If you need a refill on your cardiac medications before your next appointment, please call your pharmacy*  Lab Work: BMET, Lipids, ALT today If you have labs (blood work) drawn today and your tests are completely normal, you will receive your results only by: MyChart Message (if you have MyChart) OR A paper copy in the mail If you have any lab test that is abnormal or we need to change your treatment, we will call you to review the results.  Testing/Procedures: ECHO Your physician has requested that you have an echocardiogram. Echocardiography is a painless test that uses sound waves to create images of your heart. It provides your doctor with information about the size and shape of your heart and how well your heart's chambers and valves are working. This procedure takes approximately one hour. There are no restrictions for this procedure. Please do NOT wear cologne, perfume, aftershave, or lotions (deodorant is allowed). Please arrive 15 minutes prior to your appointment time.  Follow-Up: At [facility], you and your health needs are our priority.  As part of our continuing mission to provide you with exceptional heart care, we have created designated Provider Care Teams.  These Care Teams include your primary Cardiologist (physician) and Advanced Practice Providers (APPs -  Physician Assistants and Nurse Practitioners) who all work together to provide you with the care you need, when you need it.  Your next appointment:   1 year(s)  Provider:   Kristeen Miss, MD

## 2022-10-18 ENCOUNTER — Encounter: Payer: Self-pay | Admitting: Family

## 2022-10-20 ENCOUNTER — Encounter (INDEPENDENT_AMBULATORY_CARE_PROVIDER_SITE_OTHER): Payer: Self-pay

## 2022-11-02 ENCOUNTER — Other Ambulatory Visit (HOSPITAL_COMMUNITY): Payer: Medicare PPO

## 2022-11-04 ENCOUNTER — Ambulatory Visit (HOSPITAL_COMMUNITY)
Admission: RE | Admit: 2022-11-04 | Discharge: 2022-11-04 | Disposition: A | Discharge status: Home / Self Care | Payer: Medicare PPO | Source: Ambulatory Visit | Attending: Cardiovascular Disease | Admitting: Cardiovascular Disease

## 2022-11-04 DIAGNOSIS — I5042 Chronic combined systolic (congestive) and diastolic (congestive) heart failure: Secondary | ICD-10-CM | POA: Diagnosis not present

## 2022-11-04 DIAGNOSIS — Z952 Presence of prosthetic heart valve: Secondary | ICD-10-CM | POA: Diagnosis not present

## 2022-11-04 DIAGNOSIS — I2510 Atherosclerotic heart disease of native coronary artery without angina pectoris: Secondary | ICD-10-CM

## 2022-11-04 DIAGNOSIS — E785 Hyperlipidemia, unspecified: Secondary | ICD-10-CM

## 2022-11-04 DIAGNOSIS — I252 Old myocardial infarction: Secondary | ICD-10-CM | POA: Diagnosis not present

## 2022-11-04 DIAGNOSIS — I3481 Nonrheumatic mitral (valve) annulus calcification: Secondary | ICD-10-CM | POA: Insufficient documentation

## 2022-11-04 MED ORDER — PERFLUTREN LIPID MICROSPHERE
1.0000 mL | INTRAVENOUS | Status: AC | PRN
  Administered 2022-11-04: 2 mL via INTRAVENOUS

## 2022-11-06 LAB — ECHOCARDIOGRAM COMPLETE
AR max vel: 0.83 cm2
AV Area VTI: 0.84 cm2
AV Area mean vel: 0.86 cm2
AV Mean grad: 16.5 mmHg
AV Peak grad: 27.7 mmHg
Ao pk vel: 2.63 m/s
Area-P 1/2: 1.65 cm2
S' Lateral: 2.7 cm

## 2022-11-15 ENCOUNTER — Encounter: Payer: Self-pay | Admitting: Cardiovascular Disease

## 2022-11-16 ENCOUNTER — Ambulatory Visit: Payer: Medicare PPO | Attending: Internal Medicine | Admitting: *Deleted

## 2022-11-16 DIAGNOSIS — Z952 Presence of prosthetic heart valve: Secondary | ICD-10-CM

## 2022-11-16 DIAGNOSIS — Z5181 Encounter for therapeutic drug level monitoring: Secondary | ICD-10-CM | POA: Diagnosis not present

## 2022-11-16 LAB — POCT INR: INR: 1.9 — AB (ref 2.0–3.0)

## 2022-11-16 NOTE — Patient Instructions (Signed)
Description   Today take 1 tablet of warfarin then START taking Warfarin 1 tablet daily except 1/2 tablet on Monday, Wednesday, and Friday. Recheck in 4 weeks. Coumadin Clinic [phone] or [phone]

## 2022-11-17 ENCOUNTER — Other Ambulatory Visit: Payer: Self-pay | Admitting: Cardiovascular Disease

## 2022-11-17 ENCOUNTER — Other Ambulatory Visit: Payer: Self-pay | Admitting: Hematology & Oncology

## 2022-11-17 DIAGNOSIS — Z853 Personal history of malignant neoplasm of breast: Secondary | ICD-10-CM

## 2022-11-17 DIAGNOSIS — Z7901 Long term (current) use of anticoagulants: Secondary | ICD-10-CM

## 2022-11-17 DIAGNOSIS — Z952 Presence of prosthetic heart valve: Secondary | ICD-10-CM

## 2022-11-18 ENCOUNTER — Encounter: Payer: Self-pay | Admitting: Family

## 2022-11-18 DIAGNOSIS — B078 Other viral warts: Secondary | ICD-10-CM | POA: Diagnosis not present

## 2022-11-18 DIAGNOSIS — Z1283 Encounter for screening for malignant neoplasm of skin: Secondary | ICD-10-CM | POA: Diagnosis not present

## 2022-11-18 DIAGNOSIS — D225 Melanocytic nevi of trunk: Secondary | ICD-10-CM | POA: Diagnosis not present

## 2022-11-28 ENCOUNTER — Encounter: Payer: Medicare PPO | Admitting: Family Medicine

## 2022-11-30 ENCOUNTER — Encounter: Payer: Medicare PPO | Admitting: Family Medicine

## 2022-12-01 ENCOUNTER — Ambulatory Visit (INDEPENDENT_AMBULATORY_CARE_PROVIDER_SITE_OTHER): Payer: Medicare PPO | Admitting: Family Medicine

## 2022-12-01 ENCOUNTER — Encounter: Payer: Self-pay | Admitting: Family Medicine

## 2022-12-01 VITALS — BP 120/72 | HR 73 | Temp 98.6°F | Ht 61.02 in | Wt 125.0 lb

## 2022-12-01 DIAGNOSIS — E781 Pure hyperglyceridemia: Secondary | ICD-10-CM

## 2022-12-01 DIAGNOSIS — Z789 Other specified health status: Secondary | ICD-10-CM

## 2022-12-01 DIAGNOSIS — Z23 Encounter for immunization: Secondary | ICD-10-CM

## 2022-12-01 DIAGNOSIS — Z0000 Encounter for general adult medical examination without abnormal findings: Secondary | ICD-10-CM

## 2022-12-01 NOTE — Progress Notes (Signed)
Established Patient Office Visit   Subjective  Patient ID: Amanda Wiggins, female    DOB: 1946/02/10  Age: 77 y.o. MRN: [id number]  Chief Complaint  Patient presents with   Annual Exam    Patient is a 77 year old female seen for CPE.  Patient initially on schedule for wellness visit but will reschedule back.  Patient states she has been doing well overall.  Had recent labs with specialist.  Followed by cardiology for heart failure and aortic valve repair.  Stable on current meds.  Feeling better since starting Jardiance.  Followed by ENT for sensorineural hearing loss.    Patient Active Problem List   Diagnosis Date Noted   Statin intolerance 02/06/2022   Osteopenia 11/06/2020   Acute on chronic combined systolic and diastolic CHF (congestive heart failure) (HCC) 11/07/2019   Chronic combined systolic and diastolic CHF (congestive heart failure) (HCC) 05/23/2015   CAD S/P urgent PCI 05/16/15 05/19/2015   Cardiomyopathy, ischemic 05/19/2015   ST elevation (STEMI) myocardial infarction involving left anterior descending coronary artery (HCC)    Acute ST elevation myocardial infarction (STEMI) of anterolateral wall (HCC) 05/16/2015   Encounter for therapeutic drug monitoring 07/19/2013   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 08/22/2011   Aortic valve replaced 01/25/2011   Long term current use of anticoagulant therapy 06/25/2010   INFECTIOUS DIARRHEA 03/20/2008   DIARRHEA-PRESUMED INFECTIOUS 03/20/2008   URTICARIA 03/13/2008   FATIGUE 09/12/2007   DIARRHEA 08/29/2007   ABDOMINAL PAIN, GENERALIZED, CHRONIC 08/29/2007   Allergic rhinitis 05/09/2007   HIATAL HERNIA 05/09/2007   ARTHRITIS 05/09/2007   OSTEOPENIA 05/09/2007   GERD 01/23/2007   CANCER, ENDOMETRIUM 07/25/2006   Dyslipidemia 07/25/2006   COMMON MIGRAINE 07/25/2006   Essential hypertension 07/25/2006   Hx Breast Cancer, IDC, Stage I 07/03/2003   Past Medical History:  Diagnosis Date   Aortic valve replaced     Breast cancer (HCC) 2005   FHx: migraine headaches    GERD (gastroesophageal reflux disease)    Hiatal hernia    Hx Breast Cancer, IDC, Stage I 07/03/2003   Hx of breast cancer    Hyperlipidemia    Hypertension    Menopausal syndrome    Osteopenia    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 08/22/2011   Personal history of radiation therapy    Status post aortic valve repair    Past Surgical History:  Procedure Laterality Date   AORTIC VALVE REPLACEMENT  2003   BREAST BIOPSY Left 2005   BREAST LUMPECTOMY Left 2005   CARDIAC CATHETERIZATION     Ejection Fraction    CARDIAC CATHETERIZATION N/A 05/16/2015   Procedure: Left Heart Cath and Coronary Angiography;  Surgeon: Marykay Lex, MD;  Location: [facility] INVASIVE CV LAB;  Service: Cardiovascular;  Laterality: N/A;   CARDIAC CATHETERIZATION  05/16/2015   Procedure: Coronary Stent Intervention;  Surgeon: Marykay Lex, MD;  Location: [facility] INVASIVE CV LAB;  Service: Cardiovascular;;   CARDIAC CATHETERIZATION N/A 05/19/2015   Procedure: Left Heart Cath and Coronary Angiography;  Surgeon: Lennette Bihari, MD;  Location: [facility] INVASIVE CV LAB;  Service: Cardiovascular;  Laterality: N/A;   CARDIAC CATHETERIZATION N/A 05/19/2015   Procedure: Coronary Stent Intervention;  Surgeon: Lennette Bihari, MD;  Location: [facility] INVASIVE CV LAB;  Service: Cardiovascular;  Laterality: N/A;   HIATAL HERNIA REPAIR     HIATAL HERNIA REPAIR  05/11/2007   hysterectomy, endometiral cancer  2001   lummpectomy breast cancer  07/03/2003   nissan fundoplicaiton  2009  post-op hematoma on heparin    right shouler surgery     Social History   Tobacco Use   Smoking status: Never   Smokeless tobacco: Never  Vaping Use   Vaping status: Never Used  Substance Use Topics   Alcohol use: Yes    Alcohol/week: 0.0 standard drinks of alcohol    Comment: socially   Drug use: No   Family History  Problem Relation Age of Onset   Heart attack Mother 76   Osteoarthritis  Brother        hpercholesterolemia   Allergies  Allergen Reactions   Latex Hives   Meperidine Nausea Only, Nausea And Vomiting and Other (See Comments)    Unknown reaction   Meperidine Hcl Nausea Only and Nausea And Vomiting   Sulfa Antibiotics Nausea Only   Crestor [Rosuvastatin Calcium] Other (See Comments)    Myalgias on 10mg  and 20mg  daily   Pravastatin Other (See Comments)    myalgias   Sulfamethoxazole Nausea And Vomiting   Repatha [Evolocumab] Other (See Comments)    myalgias      ROS Negative unless stated above    Objective:     BP 120/72 (BP Location: Left Arm, Patient Position: Sitting, Cuff Size: Normal)   Pulse 73   Temp 98.6 F (37 C) (Oral)   Ht 5' 1.02" (1.55 m)   Wt 125 lb (56.7 kg)   SpO2 95%   BMI 23.60 kg/m  BP Readings from Last 3 Encounters:  12/01/22 120/72  10/17/22 (!) 100/58  08/12/22 (!) 85/60   Wt Readings from Last 3 Encounters:  12/01/22 125 lb (56.7 kg)  10/17/22 123 lb 3.2 oz (55.9 kg)  08/12/22 124 lb (56.2 kg)      Physical Exam Constitutional:      Appearance: Normal appearance.  HENT:     Head: Normocephalic and atraumatic.     Right Ear: Tympanic membrane, ear canal and external ear normal.     Left Ear: Tympanic membrane, ear canal and external ear normal.     Nose: Nose normal.     Mouth/Throat:     Mouth: Mucous membranes are moist.     Pharynx: No oropharyngeal exudate or posterior oropharyngeal erythema.  Eyes:     General: No scleral icterus.    Extraocular Movements: Extraocular movements intact.     Conjunctiva/sclera: Conjunctivae normal.     Pupils: Pupils are equal, round, and reactive to light.  Neck:     Thyroid: No thyromegaly.  Cardiovascular:     Rate and Rhythm: Normal rate and regular rhythm.     Pulses: Normal pulses.     Heart sounds: Normal heart sounds. No murmur heard.    No friction rub.  Pulmonary:     Effort: Pulmonary effort is normal.     Breath sounds: Normal breath sounds. No  wheezing, rhonchi or rales.  Abdominal:     General: Bowel sounds are normal.     Palpations: Abdomen is soft.     Tenderness: There is no abdominal tenderness.  Musculoskeletal:        General: No deformity. Normal range of motion.  Lymphadenopathy:     Cervical: No cervical adenopathy.  Skin:    General: Skin is warm and dry.     Findings: No lesion.  Neurological:     General: No focal deficit present.     Mental Status: She is alert and oriented to person, place, and time.  Psychiatric:  Mood and Affect: Mood normal.        Thought Content: Thought content normal.      No results found for any visits on 12/01/22.    Assessment & Plan:  Well adult exam  Need for influenza vaccination -     Flu Vaccine Trivalent High Dose (Fluad)  Pure hypertriglyceridemia  Statin intolerance  Age-appropriate health screenings discussed.  Recent labs from 10/17/2022 and 11/16/2022 reviewed.   Triglycerides elevated at 197 on 10/17/2022.  Discussed the importance of lifestyle modifications.  Consider medication such as fenofibrate for continued elevation given statin intolerance.  Has  appointment with specialist in the next few months to repeat labs.  Flu vaccine given this visit.  No follow-ups on file.   Deeann Saint, MD

## 2022-12-02 ENCOUNTER — Other Ambulatory Visit: Payer: Medicare PPO

## 2022-12-06 ENCOUNTER — Other Ambulatory Visit: Payer: Medicare PPO

## 2022-12-07 ENCOUNTER — Encounter: Payer: Self-pay | Admitting: Family Medicine

## 2022-12-13 ENCOUNTER — Ambulatory Visit: Payer: Medicare PPO | Attending: Cardiovascular Disease | Admitting: *Deleted

## 2022-12-13 DIAGNOSIS — Z5181 Encounter for therapeutic drug level monitoring: Secondary | ICD-10-CM | POA: Diagnosis not present

## 2022-12-13 DIAGNOSIS — Z952 Presence of prosthetic heart valve: Secondary | ICD-10-CM

## 2022-12-13 LAB — POCT INR: INR: 2.1 (ref 2.0–3.0)

## 2022-12-13 NOTE — Patient Instructions (Signed)
Description   Continue taking Warfarin 1 tablet daily except 1/2 tablet on Monday, Wednesday, and Friday. Recheck in 4 weeks. [facility] [phone] or [phone]

## 2022-12-18 ENCOUNTER — Other Ambulatory Visit: Payer: Self-pay | Admitting: Hematology & Oncology

## 2022-12-18 DIAGNOSIS — Z853 Personal history of malignant neoplasm of breast: Secondary | ICD-10-CM

## 2022-12-19 ENCOUNTER — Encounter: Payer: Self-pay | Admitting: Family

## 2022-12-29 DIAGNOSIS — H6123 Impacted cerumen, bilateral: Secondary | ICD-10-CM | POA: Diagnosis not present

## 2022-12-31 ENCOUNTER — Other Ambulatory Visit: Payer: Self-pay | Admitting: Cardiovascular Disease

## 2023-01-10 ENCOUNTER — Ambulatory Visit: Payer: Medicare PPO | Attending: Cardiovascular Disease | Admitting: *Deleted

## 2023-01-10 DIAGNOSIS — Z952 Presence of prosthetic heart valve: Secondary | ICD-10-CM

## 2023-01-10 DIAGNOSIS — Z5181 Encounter for therapeutic drug level monitoring: Secondary | ICD-10-CM

## 2023-01-10 LAB — POCT INR: INR: 2.5 (ref 2.0–3.0)

## 2023-01-10 NOTE — Patient Instructions (Signed)
Description   Continue taking Warfarin 1 tablet daily except 1/2 tablet on Monday, Wednesday, and Friday. Recheck in 5 weeks. [facility] [phone] or [phone]

## 2023-01-17 ENCOUNTER — Other Ambulatory Visit: Payer: Self-pay | Admitting: Hematology & Oncology

## 2023-01-17 DIAGNOSIS — Z853 Personal history of malignant neoplasm of breast: Secondary | ICD-10-CM

## 2023-01-18 ENCOUNTER — Encounter: Payer: Self-pay | Admitting: Family

## 2023-02-09 ENCOUNTER — Telehealth: Payer: Self-pay | Admitting: Pharmacist

## 2023-02-09 ENCOUNTER — Encounter: Payer: Self-pay | Admitting: Pharmacist

## 2023-02-09 DIAGNOSIS — E119 Type 2 diabetes mellitus without complications: Secondary | ICD-10-CM | POA: Diagnosis not present

## 2023-02-09 DIAGNOSIS — H04123 Dry eye syndrome of bilateral lacrimal glands: Secondary | ICD-10-CM | POA: Diagnosis not present

## 2023-02-09 DIAGNOSIS — H53143 Visual discomfort, bilateral: Secondary | ICD-10-CM | POA: Diagnosis not present

## 2023-02-09 DIAGNOSIS — H25013 Cortical age-related cataract, bilateral: Secondary | ICD-10-CM | POA: Diagnosis not present

## 2023-02-09 DIAGNOSIS — H524 Presbyopia: Secondary | ICD-10-CM | POA: Diagnosis not present

## 2023-02-09 DIAGNOSIS — H5213 Myopia, bilateral: Secondary | ICD-10-CM | POA: Diagnosis not present

## 2023-02-09 DIAGNOSIS — H31002 Unspecified chorioretinal scars, left eye: Secondary | ICD-10-CM | POA: Diagnosis not present

## 2023-02-09 DIAGNOSIS — H52223 Regular astigmatism, bilateral: Secondary | ICD-10-CM | POA: Diagnosis not present

## 2023-02-09 MED ORDER — PRALUENT 75 MG/ML ~~LOC~~ SOAJ: 75.0000 mg | SUBCUTANEOUS | 3 refills | Status: DC

## 2023-02-09 NOTE — Telephone Encounter (Signed)
Received VM from pt that she will need a refill on Prauent and her PA will need to be renewed. Expires at the end of the month.   She is intolerant to repatha. Last year we got a tier exception approved along with a formulary exception. This will need to be done again. However, its a little too soon to renew- PA expired 03/07/23.  Tried to call pt back. No answer. Will send mychart message.

## 2023-02-13 DIAGNOSIS — M25512 Pain in left shoulder: Secondary | ICD-10-CM | POA: Diagnosis not present

## 2023-02-13 DIAGNOSIS — M542 Cervicalgia: Secondary | ICD-10-CM | POA: Diagnosis not present

## 2023-02-14 ENCOUNTER — Ambulatory Visit: Payer: Medicare PPO | Attending: Cardiovascular Disease | Admitting: *Deleted

## 2023-02-14 DIAGNOSIS — Z5181 Encounter for therapeutic drug level monitoring: Secondary | ICD-10-CM

## 2023-02-14 DIAGNOSIS — Z952 Presence of prosthetic heart valve: Secondary | ICD-10-CM

## 2023-02-14 LAB — POCT INR: INR: 4.3 — AB (ref 2.0–3.0)

## 2023-02-14 NOTE — Patient Instructions (Signed)
Description   Do not take any warfarin today and no warfarin tomorrow (methylprednisolone 4mg  taper dose) then continue taking Warfarin 1 tablet daily except 1/2 tablet on Monday, Wednesday, and Friday. Recheck in 1 week (normally 1 week). [facility] [phone] or [phone]

## 2023-02-16 ENCOUNTER — Other Ambulatory Visit: Payer: Self-pay | Admitting: Hematology & Oncology

## 2023-02-16 DIAGNOSIS — Z853 Personal history of malignant neoplasm of breast: Secondary | ICD-10-CM

## 2023-02-22 ENCOUNTER — Ambulatory Visit: Payer: Medicare PPO | Attending: Cardiovascular Disease

## 2023-02-22 DIAGNOSIS — Z5181 Encounter for therapeutic drug level monitoring: Secondary | ICD-10-CM | POA: Diagnosis not present

## 2023-02-22 LAB — POCT INR: INR: 2.4 (ref 2.0–3.0)

## 2023-02-22 NOTE — Patient Instructions (Signed)
Description   Continue taking Warfarin 1 tablet daily except 1/2 tablet on Monday, Wednesday, and Friday. Recheck in 3 weeks  [facility] [phone] or [phone]

## 2023-03-03 ENCOUNTER — Other Ambulatory Visit (HOSPITAL_COMMUNITY): Payer: Self-pay

## 2023-03-03 ENCOUNTER — Encounter: Payer: Self-pay | Admitting: Family

## 2023-03-03 ENCOUNTER — Telehealth: Payer: Self-pay | Admitting: Pharmacy Technician

## 2023-03-03 NOTE — Telephone Encounter (Signed)
Pharmacy Patient Advocate Encounter   Received notification from Physician's Office that prior authorization for praluent is required/requested.   Insurance verification completed.   The patient is insured through [employer] .   Per test claim: Refill too soon. PA is not needed at this time. Medication was filled 02/20/23. Next eligible fill date is 05/01/23. PA on file extended until 03/06/24--  Tier exception paperwork faxed over on 03/03/23

## 2023-03-16 ENCOUNTER — Ambulatory Visit: Payer: Medicare PPO | Attending: Cardiology | Admitting: *Deleted

## 2023-03-16 DIAGNOSIS — Z5181 Encounter for therapeutic drug level monitoring: Secondary | ICD-10-CM | POA: Diagnosis not present

## 2023-03-16 DIAGNOSIS — Z952 Presence of prosthetic heart valve: Secondary | ICD-10-CM

## 2023-03-16 LAB — POCT INR: INR: 3 (ref 2.0–3.0)

## 2023-03-16 NOTE — Patient Instructions (Signed)
 Description   Today take 1/2 tablet of warfarin then continue taking Warfarin 1 tablet daily except 1/2 tablet on Monday, Wednesday, and Friday. Recheck in 3 weeks  [facility] [phone] or [phone]

## 2023-03-19 ENCOUNTER — Other Ambulatory Visit: Payer: Self-pay | Admitting: Hematology & Oncology

## 2023-03-19 DIAGNOSIS — Z853 Personal history of malignant neoplasm of breast: Secondary | ICD-10-CM

## 2023-03-20 ENCOUNTER — Encounter: Payer: Self-pay | Admitting: Family

## 2023-03-31 DIAGNOSIS — H6123 Impacted cerumen, bilateral: Secondary | ICD-10-CM | POA: Diagnosis not present

## 2023-03-31 DIAGNOSIS — R040 Epistaxis: Secondary | ICD-10-CM | POA: Diagnosis not present

## 2023-03-31 DIAGNOSIS — H903 Sensorineural hearing loss, bilateral: Secondary | ICD-10-CM | POA: Diagnosis not present

## 2023-04-02 ENCOUNTER — Other Ambulatory Visit: Payer: Self-pay | Admitting: Cardiovascular Disease

## 2023-04-06 ENCOUNTER — Ambulatory Visit: Payer: Medicare PPO | Attending: Cardiovascular Disease | Admitting: *Deleted

## 2023-04-06 DIAGNOSIS — Z5181 Encounter for therapeutic drug level monitoring: Secondary | ICD-10-CM

## 2023-04-06 DIAGNOSIS — Z952 Presence of prosthetic heart valve: Secondary | ICD-10-CM | POA: Diagnosis not present

## 2023-04-06 LAB — POCT INR: INR: 2.9 (ref 2.0–3.0)

## 2023-04-06 NOTE — Patient Instructions (Signed)
Description   Today take 1/2 tablet of warfarin then continue taking Warfarin 1 tablet daily except 1/2 tablet on Monday, Wednesday, and Friday. Recheck in 3 weeks  [facility] [phone] or [phone]

## 2023-04-19 ENCOUNTER — Other Ambulatory Visit: Payer: Self-pay | Admitting: Cardiovascular Disease

## 2023-04-19 ENCOUNTER — Other Ambulatory Visit: Payer: Self-pay | Admitting: Hematology & Oncology

## 2023-04-19 DIAGNOSIS — Z853 Personal history of malignant neoplasm of breast: Secondary | ICD-10-CM

## 2023-04-27 ENCOUNTER — Ambulatory Visit: Payer: Medicare PPO | Attending: Cardiovascular Disease

## 2023-04-27 DIAGNOSIS — Z5181 Encounter for therapeutic drug level monitoring: Secondary | ICD-10-CM

## 2023-04-27 LAB — POCT INR: INR: 2.2 (ref 2.0–3.0)

## 2023-04-27 NOTE — Patient Instructions (Signed)
Description   Continue taking Warfarin 1 tablet daily except 1/2 tablet on Monday, Wednesday, and Friday.  Recheck in 4 weeks  [facility] [phone]

## 2023-05-02 ENCOUNTER — Other Ambulatory Visit: Payer: Self-pay | Admitting: Cardiovascular Disease

## 2023-05-02 DIAGNOSIS — E785 Hyperlipidemia, unspecified: Secondary | ICD-10-CM

## 2023-05-02 DIAGNOSIS — Z7901 Long term (current) use of anticoagulants: Secondary | ICD-10-CM

## 2023-05-02 DIAGNOSIS — I2510 Atherosclerotic heart disease of native coronary artery without angina pectoris: Secondary | ICD-10-CM

## 2023-05-02 DIAGNOSIS — I5042 Chronic combined systolic (congestive) and diastolic (congestive) heart failure: Secondary | ICD-10-CM

## 2023-05-14 ENCOUNTER — Other Ambulatory Visit: Payer: Self-pay | Admitting: Cardiovascular Disease

## 2023-05-14 DIAGNOSIS — Z952 Presence of prosthetic heart valve: Secondary | ICD-10-CM

## 2023-05-14 DIAGNOSIS — Z7901 Long term (current) use of anticoagulants: Secondary | ICD-10-CM

## 2023-05-16 ENCOUNTER — Other Ambulatory Visit: Payer: Self-pay | Admitting: Hematology & Oncology

## 2023-05-16 DIAGNOSIS — Z853 Personal history of malignant neoplasm of breast: Secondary | ICD-10-CM

## 2023-05-17 ENCOUNTER — Encounter: Payer: Self-pay | Admitting: Family

## 2023-05-17 DIAGNOSIS — M25512 Pain in left shoulder: Secondary | ICD-10-CM | POA: Diagnosis not present

## 2023-05-23 DIAGNOSIS — H47323 Drusen of optic disc, bilateral: Secondary | ICD-10-CM | POA: Diagnosis not present

## 2023-05-23 DIAGNOSIS — H25043 Posterior subcapsular polar age-related cataract, bilateral: Secondary | ICD-10-CM | POA: Diagnosis not present

## 2023-05-23 DIAGNOSIS — H35372 Puckering of macula, left eye: Secondary | ICD-10-CM | POA: Diagnosis not present

## 2023-05-23 DIAGNOSIS — H18413 Arcus senilis, bilateral: Secondary | ICD-10-CM | POA: Diagnosis not present

## 2023-05-23 DIAGNOSIS — H2511 Age-related nuclear cataract, right eye: Secondary | ICD-10-CM | POA: Diagnosis not present

## 2023-05-23 DIAGNOSIS — H2513 Age-related nuclear cataract, bilateral: Secondary | ICD-10-CM | POA: Diagnosis not present

## 2023-05-25 ENCOUNTER — Ambulatory Visit: Payer: Medicare PPO | Attending: Cardiovascular Disease

## 2023-05-25 DIAGNOSIS — Z952 Presence of prosthetic heart valve: Secondary | ICD-10-CM

## 2023-05-25 DIAGNOSIS — Z5181 Encounter for therapeutic drug level monitoring: Secondary | ICD-10-CM | POA: Diagnosis not present

## 2023-05-25 LAB — POCT INR: INR: 2 (ref 2.0–3.0)

## 2023-05-25 NOTE — Patient Instructions (Signed)
 Description   Take 1.5 tablets today, then resume same dosage of Warfarin 1 tablet daily except 1/2 tablet on Monday, Wednesday, and Friday.  Recheck in 4 weeks  [facility] [phone]

## 2023-05-31 ENCOUNTER — Encounter: Payer: Self-pay | Admitting: Family Medicine

## 2023-05-31 ENCOUNTER — Ambulatory Visit: Payer: Medicare PPO | Admitting: Family Medicine

## 2023-05-31 VITALS — BP 104/58 | HR 54 | Temp 97.8°F | Ht 61.0 in | Wt 123.4 lb

## 2023-05-31 DIAGNOSIS — E118 Type 2 diabetes mellitus with unspecified complications: Secondary | ICD-10-CM

## 2023-05-31 DIAGNOSIS — Z7984 Long term (current) use of oral hypoglycemic drugs: Secondary | ICD-10-CM | POA: Diagnosis not present

## 2023-05-31 DIAGNOSIS — I5042 Chronic combined systolic (congestive) and diastolic (congestive) heart failure: Secondary | ICD-10-CM | POA: Diagnosis not present

## 2023-05-31 DIAGNOSIS — R252 Cramp and spasm: Secondary | ICD-10-CM | POA: Diagnosis not present

## 2023-05-31 DIAGNOSIS — Z789 Other specified health status: Secondary | ICD-10-CM

## 2023-05-31 DIAGNOSIS — Z853 Personal history of malignant neoplasm of breast: Secondary | ICD-10-CM

## 2023-05-31 DIAGNOSIS — E781 Pure hyperglyceridemia: Secondary | ICD-10-CM | POA: Diagnosis not present

## 2023-05-31 DIAGNOSIS — C50919 Malignant neoplasm of unspecified site of unspecified female breast: Secondary | ICD-10-CM | POA: Insufficient documentation

## 2023-05-31 DIAGNOSIS — I10 Essential (primary) hypertension: Secondary | ICD-10-CM

## 2023-05-31 DIAGNOSIS — C55 Malignant neoplasm of uterus, part unspecified: Secondary | ICD-10-CM | POA: Insufficient documentation

## 2023-05-31 DIAGNOSIS — M19049 Primary osteoarthritis, unspecified hand: Secondary | ICD-10-CM | POA: Insufficient documentation

## 2023-05-31 DIAGNOSIS — R519 Headache, unspecified: Secondary | ICD-10-CM | POA: Insufficient documentation

## 2023-05-31 LAB — BASIC METABOLIC PANEL
BUN: 24 mg/dL — ABNORMAL HIGH (ref 6–23)
CO2: 26 meq/L (ref 19–32)
Calcium: 9.8 mg/dL (ref 8.4–10.5)
Chloride: 104 meq/L (ref 96–112)
Creatinine, Ser: 0.84 mg/dL (ref 0.40–1.20)
GFR: 67.55 mL/min (ref >60.00)
Glucose, Bld: 93 mg/dL (ref 70–99)
Potassium: 4.1 meq/L (ref 3.5–5.1)
Sodium: 140 meq/L (ref 135–145)

## 2023-05-31 LAB — MICROALBUMIN / CREATININE URINE RATIO
Creatinine,U: 74.7 mg/dL
Microalb Creat Ratio: 28.5 mg/g (ref 0.0–30.0)
Microalb, Ur: 2.1 mg/dL — ABNORMAL HIGH (ref 0.0–1.9)

## 2023-05-31 LAB — HEMOGLOBIN A1C: Hgb A1c MFr Bld: 6.6 % — ABNORMAL HIGH (ref 4.6–6.5)

## 2023-05-31 LAB — MAGNESIUM: Magnesium: 2.3 mg/dL (ref 1.5–2.5)

## 2023-05-31 NOTE — Progress Notes (Signed)
 Established Patient Office Visit   Subjective  Patient ID: Amanda Wiggins, female    DOB: [birth year]  Age: 78 y.o. MRN: [id number]  Chief Complaint  Patient presents with   Medical Management of Chronic Issues    Pt is here for 6 month med mgnt Pt reports she is sneezing, nasal drip. SX started 2 wks ago. She was working in her yard yesterday. OTC Benadryl  Pt reports she is FASTING    Patient is a 78 year old female seen for 1-month follow-up on chronic conditions.  Patient states she is doing well overall.  Has noticed some muscle cramping.  Denies any SOB, LE edema, elevation in BP, CP, changes in vision.  Patient had a fall late 2024.  Still having left shoulder issues.  Working on balance.    Patient Active Problem List   Diagnosis Date Noted   Breast cancer (HCC) 05/31/2023   Degenerative arthritis of finger 05/31/2023   Headache 05/31/2023   Uterine cancer (HCC) 05/31/2023   Epistaxis 03/31/2023   Statin intolerance 02/06/2022   Sensorineural hearing loss (SNHL), bilateral 01/03/2022   Osteopenia 11/06/2020   Acute on chronic combined systolic and diastolic CHF (congestive heart failure) (HCC) 11/07/2019   Trigger finger of left thumb 08/14/2019   Anticoagulated by anticoagulation treatment 10/30/2018   Bilateral impacted cerumen 03/16/2017   Lightheadedness 03/16/2017   Chronic combined systolic and diastolic CHF (congestive heart failure) (HCC) 05/23/2015   CAD S/P urgent PCI 05/16/15 05/19/2015   Cardiomyopathy, ischemic 05/19/2015   ST elevation (STEMI) myocardial infarction involving left anterior descending coronary artery (HCC)    Acute ST elevation myocardial infarction (STEMI) of anterolateral wall (HCC) 05/16/2015   Encounter for therapeutic drug monitoring 07/19/2013   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 08/22/2011   Aortic valve replaced 01/25/2011   Long term current use of anticoagulant therapy 06/25/2010   INFECTIOUS DIARRHEA 03/20/2008    DIARRHEA-PRESUMED INFECTIOUS 03/20/2008   URTICARIA 03/13/2008   FATIGUE 09/12/2007   DIARRHEA 08/29/2007   ABDOMINAL PAIN, GENERALIZED, CHRONIC 08/29/2007   Allergic rhinitis 05/09/2007   HIATAL HERNIA 05/09/2007   ARTHRITIS 05/09/2007   OSTEOPENIA 05/09/2007   GERD 01/23/2007   CANCER, ENDOMETRIUM 07/25/2006   Dyslipidemia 07/25/2006   COMMON MIGRAINE 07/25/2006   Essential hypertension 07/25/2006   Hx Breast Cancer, IDC, Stage I 07/03/2003   Endometrial carcinoma (HCC) 03/08/1999   Past Medical History:  Diagnosis Date   Aortic valve replaced    Breast cancer (HCC) 2005   FHx: migraine headaches    GERD (gastroesophageal reflux disease)    Hiatal hernia    Hx Breast Cancer, IDC, Stage I 07/03/2003   Hx of breast cancer    Hyperlipidemia    Hypertension    Menopausal syndrome    Osteopenia    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 08/22/2011   Personal history of radiation therapy    Status post aortic valve repair    Past Surgical History:  Procedure Laterality Date   AORTIC VALVE REPLACEMENT  2003   BREAST BIOPSY Left 2005   BREAST LUMPECTOMY Left 2005   CARDIAC CATHETERIZATION     Ejection Fraction    CARDIAC CATHETERIZATION N/A 05/16/2015   Procedure: Left Heart Cath and Coronary Angiography;  Surgeon: Marykay Lex, MD;  Location: [facility] INVASIVE CV LAB;  Service: Cardiovascular;  Laterality: N/A;   CARDIAC CATHETERIZATION  05/16/2015   Procedure: Coronary Stent Intervention;  Surgeon: Marykay Lex, MD;  Location: [facility] INVASIVE CV LAB;  Service: Cardiovascular;;  CARDIAC CATHETERIZATION N/A 05/19/2015   Procedure: Left Heart Cath and Coronary Angiography;  Surgeon: Lennette Bihari, MD;  Location: [facility] INVASIVE CV LAB;  Service: Cardiovascular;  Laterality: N/A;   CARDIAC CATHETERIZATION N/A 05/19/2015   Procedure: Coronary Stent Intervention;  Surgeon: Lennette Bihari, MD;  Location: [facility] INVASIVE CV LAB;  Service: Cardiovascular;  Laterality: N/A;   HIATAL HERNIA  REPAIR     HIATAL HERNIA REPAIR  05/11/2007   hysterectomy, endometiral cancer  2001   lummpectomy breast cancer  07/03/2003   nissan fundoplicaiton  2009   post-op hematoma on heparin    right shouler surgery     Social History   Tobacco Use   Smoking status: Never   Smokeless tobacco: Never  Vaping Use   Vaping status: Never Used  Substance Use Topics   Alcohol use: Yes    Alcohol/week: 0.0 standard drinks of alcohol    Comment: socially   Drug use: No   Family History  Problem Relation Age of Onset   Heart attack Mother 50   Osteoarthritis Brother        hpercholesterolemia   Allergies  Allergen Reactions   Latex Hives   Meperidine Nausea Only, Nausea And Vomiting and Other (See Comments)    Unknown reaction   Meperidine Hcl Nausea Only and Nausea And Vomiting   Sulfa Antibiotics Nausea Only and Nausea And Vomiting   Crestor [Rosuvastatin Calcium] Other (See Comments)    Myalgias on 10mg  and 20mg  daily   Pravastatin Other (See Comments)    myalgias   Sulfamethoxazole Nausea And Vomiting   Repatha [Evolocumab] Other (See Comments)    myalgias      ROS Negative unless stated above    Objective:     BP (!) 104/58   Pulse (!) 54   Temp 97.8 F (36.6 C)   Ht 5\' 1"  (1.549 m)   Wt 123 lb 6 oz (56 kg)   SpO2 95%   BMI 23.31 kg/m  BP Readings from Last 3 Encounters:  06/06/23 110/60  05/31/23 (!) 104/58  12/01/22 120/72   Wt Readings from Last 3 Encounters:  06/06/23 121 lb 12.8 oz (55.2 kg)  05/31/23 123 lb 6 oz (56 kg)  12/01/22 125 lb (56.7 kg)      Physical Exam Constitutional:      General: She is not in acute distress.    Appearance: Normal appearance.  HENT:     Head: Normocephalic and atraumatic.     Nose: Nose normal.     Mouth/Throat:     Mouth: Mucous membranes are moist.  Cardiovascular:     Rate and Rhythm: Normal rate and regular rhythm.     Heart sounds: Normal heart sounds. No murmur heard.    No gallop.  Pulmonary:      Effort: Pulmonary effort is normal. No respiratory distress.     Breath sounds: Normal breath sounds. No wheezing, rhonchi or rales.  Skin:    General: Skin is warm and dry.  Neurological:     Mental Status: She is alert and oriented to person, place, and time.    No results found for any visits on 05/31/23.    Assessment & Plan:  Chronic combined systolic and diastolic CHF (congestive heart failure) (HCC)  Controlled type 2 diabetes mellitus with complication, without long-term current use of insulin (HCC) -     Microalbumin / creatinine urine ratio -     Hemoglobin A1c  Muscle cramps -  Basic metabolic panel -     Magnesium  Essential hypertension -     Basic metabolic panel  Pure hypertriglyceridemia  Statin intolerance  Pt seen for chronic conditions which are stable.  Euvolemic on exam.  EF 40-45% on echo from 11/04/2022.  Continue current medications and follow-up with cardiology.  A1c 5.7% on 05/30/2022.  Repeat A1c this visit.  Continue Jardiance 10 mg.  Encouraged to schedule eye exam.  Foot exam at next [facility].  Return in about 6 months (around 12/01/2023).   Deeann Saint, MD

## 2023-06-05 ENCOUNTER — Encounter: Payer: Self-pay | Admitting: Family Medicine

## 2023-06-05 ENCOUNTER — Encounter: Payer: Self-pay | Admitting: Cardiovascular Disease

## 2023-06-05 NOTE — Progress Notes (Unsigned)
 Amanda Wiggins Date of Birth  Jun 21, 1945 [facility] [address]    [address] [place], [place]  [address]    Problem List 1. Aortic valve replacement 2. Hyperlipidemia 3. Breast cancer 4. CAD -  Mid LAD to Dist LAD lesion, 100% stenosed. Post intervention with a single Synergy DES 2.25 mm x 20 mm S/p mini vision stent to distal LAD   Amanda Wiggins is a 78 year old female with a history of aortic stenosis-status post aortic valve replacement.  She also has a history of hypercholesterolemia.  She's not had any episodes of chest pain or shortness of breath.  She's had her INR levels checked in our Coumadin clinic. Her INR levels have all been therapeutic.  Nov. 6 , 2014  Amanda Wiggins is doing well.  i saw her a year ago.  She is 10 years our from her breast cancer and is doing well.    No cardiac complaints.   Able to do all of her normal activities.     Nov. 13, 2015:  Amanda Wiggins is doing well.   No CP .  No dyspnea.   Has been stressful.  Her mother died this past year.   Her INR levels have been  Well controlled   Nov. 14, 2016:  Doing well.  No cardiac issues.  INR levels have been stable,  A bit low the last time .  Had an AVR 13 years ago .    June 05, 2015:  Amanda Wiggins was recently seen at the hospital for an acute anterior wall ST segment elevation myocardial infarction. She had stenting of her mid LAD. She had recurrent pain on the day of her original discharge and was taken back to the Cath Lab and had a second stent placed. She continued to have some intermittent episodes of chest discomfort. She has had persistent ST segment elevation. Her left ventricular systolic function is moderately depressed with an EF of 40-45%.   She has akinesis of the distal anterior wall and apex.   She is not having the band like chest pain that she had on presentation Has had some GERD.   Asked about going back on Omeprazole  - we discussed her need for Protonix with the plavix .   September 01, 2015:  Amanda Wiggins is seen back for follow up visit. Still having trouble getting over her MI .  Still fatigued but is slowly getting better.   No CP.      Dec. 12, 2017:  feeling ok Does not have the energy that she wants ( and used to have prior to her MI )  BP has been in the 90-100 range.   September 21, 2016:    Amanda Wiggins is seen today for her AVR and CAD / MI  Her husband, Amanda Wiggins, died last week  Has some leg aches.   Feb. 13, 2019: Amanda Wiggins is seen back today for follow up of her AVR and CHF Echo shows LV EF of 40% , mean AV gradient is 21 mmHg.  Is on coumadin and plavix  Has occasional left shoulder pain  Had stenting of LAD in March 2017.    Has had some balance issues.   Saw ENT. Thinks it may be orthostatic hypotension  Had ears cleaned out.   Feels better  Aug. 26, 2019:  Doing well. Has CAD, AVR   No recent CP , Gets winded with little exertion EF is 40%.    Bruises quite a bit - is on  coumadin and plavix   Feb. 24, 2020:  Seen for follow up of her CAD and AVR. Continues to have balance issues.   Lists to one side. She has been going to PT .   Was thought to be due to muscle weakness possibly related to the crestor.   She is on Pralulent  And her balance and muscle strength is better.  Works out at [employer] and is doing yoga   January, 2021:  Amanda Wiggins is seen today for a follow-up visit.  She was in the emergency room last week with chest pain. The pain was described as an achiness located under both breast.  It did been there for a week.  Pain seems to be different than her angina equivalent.  She had -2 - troponins while in the emergency room and was discharged in satisfactory condition.  The pain settled in her left shoulder blade .  No dyspnea, no pleuretic cp   Sept. 2, 2021  Amanda Wiggins is seen today for follow up of her AVR, CAD, CHF Recent echo revealed reduced LV function with EF 30-35% We brought her back to start Entresto but her BP was too low to start Entresto.   Was  started on Losartan instead.  Was started on Lasix 20 mg 3 times a weeks.    Is avoiding salt and salty foods.   January 10, 2020: Amanda Wiggins is seen today for follow-up visit.  She has a history of aortic valve replacement, coronary artery disease, congestive heart failure.  Her echocardiogram shows an ejection fraction of 30 to 35%.  We have started her on losartan and lasix   She has been changed to [facility] .  Is off lasix .   Feeling better on the [facility]   Has had some left arm / neck pain   Jul 09, 2020:  Amanda Wiggins is seen today for follow up of her AVR, CAD, CHF Is on Entresto . Seems to be tolerating it well Was falling  But with PT she is getting stronger and has not fallen in a while  Is going to the gym regularly ,   INR is 1.9 today  Is tolerating the low dose entresto BP is marginal but no orthostasis   Nov. 1, 2022:  Amanda Wiggins is seen today for follow up of her AVR, CAD, CHF On entresto, LV function has not changed much - 30-35% Grade 1 DD  Mean AV gradient of 17 mmhg     Is back in the gym. Walks outside also  Has some DOE with exercise    Nov. 6, 2023  Amanda Wiggins is seen today for follow up of AVR, CAD, CHF Asked about the RSV vaccine - not able to give a comment on this .   Has lost weight by avoiding carbs  Has not been exercising much until recently  Is back in the gym 3 days a week .    Has chronic combined CHF On Coreg, Entresto   Her latest BMP revealed a potassium level of 5.2   Recent glucose was 130, HbA1C was 6.6    Aug. 12, 2024 Amanda Wiggins is seen for follow up of her AVR, CHF, CAD  Overall , is feeling better  Wonders if its the addition of the  jardiance  Is having more good days than bad days   Her A1C is better  Goes to the gym 3 days a week   Will check echo to evaluate her LV function  Lipids, BMP,  ALT today  Will see her in 1 year   June 06, 2023 Amanda Wiggins is seen for her AVR, CHF, CAD      Current Outpatient Medications on File Prior to  Visit  Medication Sig Dispense Refill   Alirocumab (PRALUENT) 75 MG/ML SOAJ Inject 1 mL (75 mg total) into the skin every 14 (fourteen) days. 6 mL 3   amoxicillin (AMOXIL) 500 MG capsule For dental work (Patient not taking: Reported on 05/31/2023)     benzonatate (TESSALON) 100 MG capsule Take 1 capsule (100 mg total) by mouth 2 (two) times daily as needed for cough. (Patient not taking: Reported on 05/31/2023) 20 capsule 0   BIOTIN PO Take 1 tablet by mouth every morning.     Calcium Carbonate-Vitamin D 600-400 MG-UNIT tablet Take 1 tablet by mouth 2 (two) times daily.     carvedilol (COREG) 3.125 MG tablet TAKE 1 TABLET BY MOUTH TWICE A DAY WITH FOOD 180 tablet 3   clopidogrel (PLAVIX) 75 MG tablet TAKE 1 TABLET BY MOUTH EVERY DAY 90 tablet 3   cycloSPORINE (RESTASIS) 0.05 % ophthalmic emulsion Place 1 drop into both eyes 2 (two) times daily as needed (DRY EYE).     Desoximetasone 0.05 % GEL APPLY TO AFFECTED AREA ONCE TO 2 TIMES DAILY 45 g 6   empagliflozin (JARDIANCE) 10 MG TABS tablet TAKE 1 TABLET BY MOUTH DAILY BEFORE BREAKFAST. 90 tablet 1   Glucosamine-Chondroit-Vit C-Mn (GLUCOSAMINE CHONDR 1500 COMPLX) CAPS Take 1 capsule by mouth every morning.      loratadine (CLARITIN) 10 MG tablet Take 10 mg by mouth daily.     Multiple Vitamin (MULTIVITAMIN) capsule Take 1 capsule by mouth daily.     nitroGLYCERIN (NITROSTAT) 0.4 MG SL tablet Dissolve 1 tablet under the tongue every 5 minutes as needed for chest pain. Max of 3 doses, then 911. 25 tablet 6   pantoprazole (PROTONIX) 40 MG tablet TAKE 1 TABLET BY MOUTH EVERY DAY 90 tablet 2   sacubitril-valsartan (ENTRESTO) 24-26 MG TAKE 1 TABLET BY MOUTH TWICE A DAY 180 tablet 1   temazepam (RESTORIL) 30 MG capsule TAKE 1 CAPSULE BY MOUTH AT BEDTIME AS NEEDED FOR SLEEP 30 capsule 0   warfarin (COUMADIN) 5 MG tablet TAKE AS DIRECTED BY COUMADIN CLINIC 80 tablet 1   No current facility-administered medications on file prior to visit.    Allergies   Allergen Reactions   Latex Hives   Meperidine Nausea Only, Nausea And Vomiting and Other (See Comments)    Unknown reaction   Meperidine Hcl Nausea Only and Nausea And Vomiting   Sulfa Antibiotics Nausea Only and Nausea And Vomiting   Crestor [Rosuvastatin Calcium] Other (See Comments)    Myalgias on 10mg  and 20mg  daily   Pravastatin Other (See Comments)    myalgias   Sulfamethoxazole Nausea And Vomiting   Repatha [Evolocumab] Other (See Comments)    myalgias    Past Medical History:  Diagnosis Date   Aortic valve replaced    Breast cancer (HCC) 2005   FHx: migraine headaches    GERD (gastroesophageal reflux disease)    Hiatal hernia    Hx Breast Cancer, IDC, Stage I 07/03/2003   Hx of breast cancer    Hyperlipidemia    Hypertension    Menopausal syndrome    Osteopenia    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 08/22/2011   Personal history of radiation therapy    Status post aortic valve repair     Past Surgical History:  Procedure Laterality Date   AORTIC VALVE REPLACEMENT  2003   BREAST BIOPSY Left 2005   BREAST LUMPECTOMY Left 2005   CARDIAC CATHETERIZATION     Ejection Fraction    CARDIAC CATHETERIZATION N/A 05/16/2015   Procedure: Left Heart Cath and Coronary Angiography;  Surgeon: Marykay Lex, MD;  Location[facility] INVASIVE CV LAB;  Service: Cardiovascular;  Laterality: N/A;   CARDIAC CATHETERIZATION  05/16/2015   Procedure: Coronary Stent Intervention;  Surgeon: Marykay Lex, MD;  Location: [facility] INVASIVE CV LAB;  Service: Cardiovascular;;   CARDIAC CATHETERIZATION N/A 05/19/2015   Procedure: Left Heart Cath and Coronary Angiography;  Surgeon: Lennette Bihari, MD;  [facility];  Service: Cardiovascular;  Laterality: N/A;   CARDIAC CATHETERIZATION N/A 05/19/2015   Procedure: Coronary Stent Intervention;  Surgeon: Lennette Bihari, MD;  Location: [facility] INVASIVE CV LAB;  Service: Cardiovascular;  Laterality: N/A;   HIATAL HERNIA REPAIR     HIATAL  HERNIA REPAIR  05/11/2007   hysterectomy, endometiral cancer  2001   lummpectomy breast cancer  07/03/2003   nissan fundoplicaiton  2009   post-op hematoma on heparin    right shouler surgery      Social History   Tobacco Use  Smoking Status Never  Smokeless Tobacco Never    Social History   Substance and Sexual Activity  Alcohol Use Yes   Alcohol/week: 0.0 standard drinks of alcohol   Comment: socially    Family History  Problem Relation Age of Onset   Heart attack Mother 67   Osteoarthritis Brother        hpercholesterolemia    Reviw of Systems:  Noted in current history, otherwise review of systems is negative.   Physical Exam: There were no vitals taken for this visit.  No BP recorded.  {Refresh Note OR Click here to enter BP  :1}***    GEN:  Well nourished, well developed in no acute distress HEENT: Normal NECK: No JVD; No carotid bruits LYMPHATICS: No lymphadenopathy CARDIAC: RRR ***, no murmurs, rubs, gallops RESPIRATORY:  Clear to auscultation without rales, wheezing or rhonchi  ABDOMEN: Soft, non-tender, non-distended MUSCULOSKELETAL:  No edema; No deformity  SKIN: Warm and dry NEUROLOGIC:  Alert and oriented x 3     ECG:          Assessment / Plan:   1. Aortic valve replacement -      2. Hyperlipidemia -           3.  Chronic systolic/ diastolic congestive heart failure:        4. CAD  -   no angina      Kristeen Miss, MD  06/05/2023 6:29 PM    [facility] [address] [place],  [address] [place], [place]  [address] Pager [phone] Phone: [phone]; Fax: [phone]

## 2023-06-06 ENCOUNTER — Encounter: Payer: Self-pay | Admitting: Cardiovascular Disease

## 2023-06-06 ENCOUNTER — Ambulatory Visit: Payer: Medicare PPO | Attending: Cardiovascular Disease | Admitting: Cardiovascular Disease

## 2023-06-06 VITALS — BP 110/60 | HR 65 | Ht 61.0 in | Wt 121.8 lb

## 2023-06-06 DIAGNOSIS — Z952 Presence of prosthetic heart valve: Secondary | ICD-10-CM

## 2023-06-06 DIAGNOSIS — Z09 Encounter for follow-up examination after completed treatment for conditions other than malignant neoplasm: Secondary | ICD-10-CM | POA: Diagnosis not present

## 2023-06-06 DIAGNOSIS — I2510 Atherosclerotic heart disease of native coronary artery without angina pectoris: Secondary | ICD-10-CM

## 2023-06-06 NOTE — Patient Instructions (Addendum)
 Labs: Lipids, ALT, BMET today  Follow-Up: At [facility], you and your health needs are our priority.  As part of our continuing mission to provide you with exceptional heart care, our providers are all part of one team.  This team includes your primary Cardiologist (physician) and Advanced Practice Providers or APPs (Physician Assistants and Nurse Practitioners) who all work together to provide you with the care you need, when you need it.  Your next appointment:   1 year(s)  Provider:   Kristeen Miss, MD  Epifanio Lesches, MD)      1st Floor: - Lobby - Registration  - Pharmacy  - Lab - Cafe  2nd Floor: - PV Lab - Diagnostic Testing (echo, CT, nuclear med)  3rd Floor: - Vacant  4th Floor: - TCTS (cardiothoracic surgery) - AFib Clinic - Structural Heart Clinic - Vascular Surgery  - Vascular Ultrasound  5th Floor: - HeartCare Cardiology (general and EP) - Clinical Pharmacy for coumadin, hypertension, lipid, weight-loss medications, and med management appointments    Valet parking services will be available as well.

## 2023-06-12 DIAGNOSIS — E118 Type 2 diabetes mellitus with unspecified complications: Secondary | ICD-10-CM | POA: Insufficient documentation

## 2023-06-12 DIAGNOSIS — E781 Pure hyperglyceridemia: Secondary | ICD-10-CM | POA: Insufficient documentation

## 2023-06-14 DIAGNOSIS — M25512 Pain in left shoulder: Secondary | ICD-10-CM | POA: Diagnosis not present

## 2023-06-14 DIAGNOSIS — M542 Cervicalgia: Secondary | ICD-10-CM | POA: Diagnosis not present

## 2023-06-19 ENCOUNTER — Other Ambulatory Visit: Payer: Self-pay | Admitting: Hematology & Oncology

## 2023-06-19 DIAGNOSIS — Z853 Personal history of malignant neoplasm of breast: Secondary | ICD-10-CM

## 2023-06-20 ENCOUNTER — Encounter: Payer: Self-pay | Admitting: Family

## 2023-06-22 ENCOUNTER — Ambulatory Visit: Attending: Cardiovascular Disease

## 2023-06-22 DIAGNOSIS — Z952 Presence of prosthetic heart valve: Secondary | ICD-10-CM | POA: Diagnosis not present

## 2023-06-22 DIAGNOSIS — Z5181 Encounter for therapeutic drug level monitoring: Secondary | ICD-10-CM | POA: Diagnosis not present

## 2023-06-22 DIAGNOSIS — I2510 Atherosclerotic heart disease of native coronary artery without angina pectoris: Secondary | ICD-10-CM | POA: Diagnosis not present

## 2023-06-22 DIAGNOSIS — Z09 Encounter for follow-up examination after completed treatment for conditions other than malignant neoplasm: Secondary | ICD-10-CM | POA: Diagnosis not present

## 2023-06-22 LAB — POCT INR: INR: 2.6 (ref 2.0–3.0)

## 2023-06-22 NOTE — Patient Instructions (Addendum)
 Description   Continue of Warfarin 1 tablet daily except 1/2 tablet on Monday, Wednesday, and Friday.  Recheck in 5 weeks  Coumadin Clinic [phone]

## 2023-06-23 ENCOUNTER — Other Ambulatory Visit (HOSPITAL_COMMUNITY): Payer: Self-pay

## 2023-06-23 ENCOUNTER — Telehealth: Payer: Self-pay | Admitting: Pharmacy Technician

## 2023-06-23 ENCOUNTER — Encounter: Payer: Self-pay | Admitting: Cardiovascular Disease

## 2023-06-23 LAB — BASIC METABOLIC PANEL WITH GFR
BUN/Creatinine Ratio: 19 (ref 12–28)
BUN: 18 mg/dL (ref 8–27)
CO2: 25 mmol/L (ref 20–29)
Calcium: 10.1 mg/dL (ref 8.7–10.3)
Chloride: 102 mmol/L (ref 96–106)
Creatinine, Ser: 0.93 mg/dL (ref 0.57–1.00)
Glucose: 102 mg/dL — ABNORMAL HIGH (ref 70–99)
Potassium: 4.5 mmol/L (ref 3.5–5.2)
Sodium: 143 mmol/L (ref 134–144)
eGFR: 64 mL/min/{1.73_m2} (ref >59)

## 2023-06-23 LAB — LIPID PANEL
Chol/HDL Ratio: 2.8 ratio (ref 0.0–4.4)
Cholesterol, Total: 165 mg/dL (ref 100–199)
HDL: 59 mg/dL (ref >39)
LDL Chol Calc (NIH): 83 mg/dL (ref 0–99)
Triglycerides: 135 mg/dL (ref 0–149)
VLDL Cholesterol Cal: 23 mg/dL (ref 5–40)

## 2023-06-23 LAB — ALT: ALT: 23 IU/L (ref 0–32)

## 2023-06-23 NOTE — Telephone Encounter (Signed)
 Pharmacy Patient Advocate Encounter   Received notification from Fax that prior authorization for Praluent  is required/requested.   Insurance verification completed.   The patient is insured through [employer] .   Per test claim: Refill too soon. PA is not needed at this time. Medication was filled 05/15/23. Next eligible fill date is 07/24/23.   PA on file until 03/06/24

## 2023-07-05 DIAGNOSIS — H6123 Impacted cerumen, bilateral: Secondary | ICD-10-CM | POA: Diagnosis not present

## 2023-07-05 DIAGNOSIS — Z974 Presence of external hearing-aid: Secondary | ICD-10-CM | POA: Diagnosis not present

## 2023-07-19 ENCOUNTER — Other Ambulatory Visit: Payer: Self-pay | Admitting: Hematology & Oncology

## 2023-07-19 DIAGNOSIS — Z853 Personal history of malignant neoplasm of breast: Secondary | ICD-10-CM

## 2023-07-26 ENCOUNTER — Ambulatory Visit: Attending: Cardiovascular Disease | Admitting: *Deleted

## 2023-07-26 DIAGNOSIS — Z5181 Encounter for therapeutic drug level monitoring: Secondary | ICD-10-CM

## 2023-07-26 DIAGNOSIS — Z952 Presence of prosthetic heart valve: Secondary | ICD-10-CM

## 2023-07-26 LAB — POCT INR: INR: 3 (ref 2.0–3.0)

## 2023-07-26 NOTE — Patient Instructions (Addendum)
 Description   Tomorrow take 1/2 tablet of warfarin then continue taking warfarin 1 tablet daily except 1/2 tablet on Monday, Wednesday, and Friday.  Recheck in 4 weeks  Coumadin  Clinic [phone]

## 2023-08-08 ENCOUNTER — Other Ambulatory Visit: Payer: Self-pay | Admitting: Family Medicine

## 2023-08-08 DIAGNOSIS — Z1231 Encounter for screening mammogram for malignant neoplasm of breast: Secondary | ICD-10-CM

## 2023-08-10 ENCOUNTER — Other Ambulatory Visit: Payer: Self-pay

## 2023-08-10 DIAGNOSIS — Z7901 Long term (current) use of anticoagulants: Secondary | ICD-10-CM

## 2023-08-10 DIAGNOSIS — Z853 Personal history of malignant neoplasm of breast: Secondary | ICD-10-CM

## 2023-08-11 ENCOUNTER — Inpatient Hospital Stay: Payer: Medicare PPO | Attending: Hematology & Oncology

## 2023-08-11 ENCOUNTER — Inpatient Hospital Stay: Payer: Medicare PPO

## 2023-08-11 ENCOUNTER — Inpatient Hospital Stay

## 2023-08-11 ENCOUNTER — Inpatient Hospital Stay: Payer: Medicare PPO | Admitting: Hematology & Oncology

## 2023-08-11 ENCOUNTER — Encounter: Payer: Self-pay | Admitting: Hematology & Oncology

## 2023-08-11 VITALS — BP 84/48 | HR 61 | Temp 98.1°F | Resp 18 | Ht 61.0 in | Wt 123.1 lb

## 2023-08-11 VITALS — BP 86/52 | HR 66

## 2023-08-11 DIAGNOSIS — Z885 Allergy status to narcotic agent status: Secondary | ICD-10-CM | POA: Insufficient documentation

## 2023-08-11 DIAGNOSIS — Z923 Personal history of irradiation: Secondary | ICD-10-CM | POA: Insufficient documentation

## 2023-08-11 DIAGNOSIS — Z170 Estrogen receptor positive status [ER+]: Secondary | ICD-10-CM

## 2023-08-11 DIAGNOSIS — C50312 Malignant neoplasm of lower-inner quadrant of left female breast: Secondary | ICD-10-CM | POA: Diagnosis not present

## 2023-08-11 DIAGNOSIS — Z882 Allergy status to sulfonamides status: Secondary | ICD-10-CM | POA: Insufficient documentation

## 2023-08-11 DIAGNOSIS — Z952 Presence of prosthetic heart valve: Secondary | ICD-10-CM | POA: Diagnosis not present

## 2023-08-11 DIAGNOSIS — R2689 Other abnormalities of gait and mobility: Secondary | ICD-10-CM | POA: Diagnosis not present

## 2023-08-11 DIAGNOSIS — M818 Other osteoporosis without current pathological fracture: Secondary | ICD-10-CM

## 2023-08-11 DIAGNOSIS — Z853 Personal history of malignant neoplasm of breast: Secondary | ICD-10-CM | POA: Diagnosis not present

## 2023-08-11 DIAGNOSIS — Z7901 Long term (current) use of anticoagulants: Secondary | ICD-10-CM | POA: Insufficient documentation

## 2023-08-11 DIAGNOSIS — C549 Malignant neoplasm of corpus uteri, unspecified: Secondary | ICD-10-CM

## 2023-08-11 LAB — CBC WITH DIFFERENTIAL (CANCER CENTER ONLY)
Abs Immature Granulocytes: 0.01 10*3/uL (ref 0.00–0.07)
Basophils Absolute: 0 10*3/uL (ref 0.0–0.1)
Basophils Relative: 1 %
Eosinophils Absolute: 0.1 10*3/uL (ref 0.0–0.5)
Eosinophils Relative: 3 %
HCT: 39 % (ref 36.0–46.0)
Hemoglobin: 12.4 g/dL (ref 12.0–15.0)
Immature Granulocytes: 0 %
Lymphocytes Relative: 30 %
Lymphs Abs: 1.4 10*3/uL (ref 0.7–4.0)
MCH: 28.6 pg (ref 26.0–34.0)
MCHC: 31.8 g/dL (ref 30.0–36.0)
MCV: 90.1 fL (ref 80.0–100.0)
Monocytes Absolute: 0.5 10*3/uL (ref 0.1–1.0)
Monocytes Relative: 10 %
Neutro Abs: 2.5 10*3/uL (ref 1.7–7.7)
Neutrophils Relative %: 56 %
Platelet Count: 305 10*3/uL (ref 150–400)
RBC: 4.33 MIL/uL (ref 3.87–5.11)
RDW: 14.2 % (ref 11.5–15.5)
WBC Count: 4.5 10*3/uL (ref 4.0–10.5)
nRBC: 0 % (ref 0.0–0.2)

## 2023-08-11 LAB — CMP (CANCER CENTER ONLY)
ALT: 13 U/L (ref 0–44)
AST: 22 U/L (ref 15–41)
Albumin: 4.2 g/dL (ref 3.5–5.0)
Alkaline Phosphatase: 41 U/L (ref 38–126)
Anion gap: 6 (ref 5–15)
BUN: 18 mg/dL (ref 8–23)
CO2: 28 mmol/L (ref 22–32)
Calcium: 9.2 mg/dL (ref 8.9–10.3)
Chloride: 104 mmol/L (ref 98–111)
Creatinine: 0.98 mg/dL (ref 0.44–1.00)
GFR, Estimated: 60 mL/min — ABNORMAL LOW (ref >60)
Glucose, Bld: 150 mg/dL — ABNORMAL HIGH (ref 70–99)
Potassium: 4.2 mmol/L (ref 3.5–5.1)
Sodium: 138 mmol/L (ref 135–145)
Total Bilirubin: 0.4 mg/dL (ref 0.0–1.2)
Total Protein: 6.4 g/dL — ABNORMAL LOW (ref 6.5–8.1)

## 2023-08-11 LAB — LACTATE DEHYDROGENASE: LDH: 161 U/L (ref 98–192)

## 2023-08-11 MED ORDER — SODIUM CHLORIDE 0.9 % IV SOLN: INTRAVENOUS | Status: DC

## 2023-08-11 MED ORDER — ZOLEDRONIC ACID 4 MG/100ML IV SOLN
4.0000 mg | Freq: Once | INTRAVENOUS | Status: AC
  Administered 2023-08-11: 4 mg via INTRAVENOUS
  Filled 2023-08-11: qty 100

## 2023-08-11 NOTE — Patient Instructions (Signed)

## 2023-08-11 NOTE — Progress Notes (Signed)
 Hematology and Oncology Follow Up Visit  Amanda Wiggins [id number] 1945-08-25 78 y.o. 08/11/2023   Principle Diagnosis:  1. Stage I (T1 N0 M0) ductal carcinoma of the left breast. 2. Mechanical aortic valve.  Current Therapy:    Coumadin -lifelong Zometa  5 mg IV q. Year -next dose on June 2026     Interim History:  [name] is back for followup.  We see her yearly.  She cannot have cataract surgery.  I think she will have this in July or August.  She is doing well on the Coumadin .  She is followed by the Coumadin  clinic for this..  She has had no issues with bleeding.  However, she does have some imbalance issues.  She has had some falling.  Thankfully, she has had no falls that hit her head.  She has had no problems with rashes.  She has had no leg swelling.  She has had no problems with nausea or vomiting.  I think she is due for mammogram in July.  Overall, I would say that her performance status is probably ECOG 1.    Medications:  Current Outpatient Medications:    Alirocumab  (PRALUENT ) 75 MG/ML SOAJ, Inject 1 mL (75 mg total) into the skin every 14 (fourteen) days., Disp: 6 mL, Rfl: 3   benzonatate  (TESSALON ) 100 MG capsule, Take 1 capsule (100 mg total) by mouth 2 (two) times daily as needed for cough., Disp: 20 capsule, Rfl: 0   BIOTIN PO, Take 1 tablet by mouth every morning., Disp: , Rfl:    Calcium  Carbonate-Vitamin D  600-400 MG-UNIT tablet, Take 1 tablet by mouth 2 (two) times daily., Disp: , Rfl:    carvedilol  (COREG ) 3.125 MG tablet, TAKE 1 TABLET BY MOUTH TWICE A DAY WITH FOOD, Disp: 180 tablet, Rfl: 3   clopidogrel  (PLAVIX ) 75 MG tablet, TAKE 1 TABLET BY MOUTH EVERY DAY, Disp: 90 tablet, Rfl: 3   cycloSPORINE  (RESTASIS ) 0.05 % ophthalmic emulsion, Place 1 drop into both eyes 2 (two) times daily as needed (DRY EYE)., Disp: , Rfl:    Desoximetasone  0.05 % GEL, APPLY TO AFFECTED AREA ONCE TO 2 TIMES DAILY, Disp: 45 g, Rfl: 6   empagliflozin  (JARDIANCE ) 10 MG TABS  tablet, TAKE 1 TABLET BY MOUTH DAILY BEFORE BREAKFAST., Disp: 90 tablet, Rfl: 1   fexofenadine (ALLEGRA) 60 MG tablet, Take 60 mg by mouth 2 (two) times daily., Disp: , Rfl:    Glucosamine-Chondroit-Vit C-Mn (GLUCOSAMINE CHONDR 1500 COMPLX) CAPS, Take 1 capsule by mouth every morning. , Disp: , Rfl:    loratadine  (CLARITIN ) 10 MG tablet, Take 10 mg by mouth daily., Disp: , Rfl:    Multiple Vitamin (MULTIVITAMIN) capsule, Take 1 capsule by mouth daily., Disp: , Rfl:    pantoprazole  (PROTONIX ) 40 MG tablet, TAKE 1 TABLET BY MOUTH EVERY DAY, Disp: 90 tablet, Rfl: 2   sacubitril -valsartan  (ENTRESTO ) 24-26 MG, TAKE 1 TABLET BY MOUTH TWICE A DAY, Disp: 180 tablet, Rfl: 1   temazepam  (RESTORIL ) 30 MG capsule, TAKE 1 CAPSULE BY MOUTH AT BEDTIME AS NEEDED FOR SLEEP, Disp: 30 capsule, Rfl: 0   warfarin (COUMADIN ) 5 MG tablet, TAKE AS DIRECTED BY COUMADIN  CLINIC, Disp: 80 tablet, Rfl: 1   amoxicillin  (AMOXIL ) 500 MG capsule, For dental work (Patient not taking: Reported on 05/31/2023), Disp: , Rfl:    nitroGLYCERIN  (NITROSTAT ) 0.4 MG SL tablet, Dissolve 1 tablet under the tongue every 5 minutes as needed for chest pain. Max of 3 doses, then 911. (Patient not taking:  Reported on 08/11/2023), Disp: 25 tablet, Rfl: 6  Allergies:  Allergies  Allergen Reactions   Latex Hives   Meperidine Nausea Only, Nausea And Vomiting and Other (See Comments)    Unknown reaction   Meperidine Hcl Nausea And Vomiting and Nausea Only   Sulfa Antibiotics Nausea Only and Nausea And Vomiting   Crestor  [Rosuvastatin  Calcium ] Other (See Comments)    Myalgias on 10mg  and 20mg  daily   Pravastatin  Other (See Comments)    myalgias   Sulfamethoxazole Nausea And Vomiting   Repatha  [Evolocumab ] Other (See Comments)    myalgias    Past Medical History, Surgical history, Social history, and Family History were reviewed and updated.  Review of Systems: Review of Systems  Constitutional: Negative.   HENT: Negative.    Eyes: Negative.    Cardiovascular: Negative.   Gastrointestinal: Negative.   Genitourinary: Negative.   Musculoskeletal: Negative.   Skin: Negative.   Neurological: Negative.   Endo/Heme/Allergies: Negative.   Psychiatric/Behavioral: Negative.       Physical Exam:  height is 5\' 1"  (1.549 m) and weight is 123 lb 1.9 oz (55.8 kg). Her oral temperature is 98.1 F (36.7 C). Her blood pressure is 84/48 (abnormal) and her pulse is 61. Her respiration is 18 and oxygen saturation is 98%.   Physical Exam Vitals reviewed.  HENT:     Head: Normocephalic and atraumatic.  Eyes:     Pupils: Pupils are equal, round, and reactive to light.  Cardiovascular:     Rate and Rhythm: Normal rate and regular rhythm.     Heart sounds: Normal heart sounds.  Pulmonary:     Effort: Pulmonary effort is normal.     Breath sounds: Normal breath sounds.  Abdominal:     General: Bowel sounds are normal.     Palpations: Abdomen is soft.  Musculoskeletal:        General: No tenderness or deformity. Normal range of motion.     Cervical back: Normal range of motion.  Lymphadenopathy:     Cervical: No cervical adenopathy.  Skin:    General: Skin is warm and dry.     Findings: No erythema or rash.  Neurological:     Mental Status: She is alert and oriented to person, place, and time.  Psychiatric:        Behavior: Behavior normal.        Thought Content: Thought content normal.        Judgment: Judgment normal.     Lab Results  Component Value Date   WBC 4.5 08/11/2023   HGB 12.4 08/11/2023   HCT 39.0 08/11/2023   MCV 90.1 08/11/2023   PLT 305 08/11/2023     Chemistry      Component Value Date/Time   NA 138 08/11/2023 0848   NA 143 06/22/2023 1356   NA 147 (H) 02/27/2017 0952   NA 142 02/12/2015 1125   K 4.2 08/11/2023 0848   K 4.3 02/27/2017 0952   K 3.8 02/12/2015 1125   CL 104 08/11/2023 0848   CL 106 02/27/2017 0952   CO2 28 08/11/2023 0848   CO2 30 02/27/2017 0952   CO2 24 02/12/2015 1125   BUN  18 08/11/2023 0848   BUN 18 06/22/2023 1356   BUN 14 02/27/2017 0952   BUN 14.7 02/12/2015 1125   CREATININE 0.98 08/11/2023 0848   CREATININE 0.9 02/27/2017 0952   CREATININE 1.0 02/12/2015 1125      Component Value Date/Time   CALCIUM  9.2  08/11/2023 0848   CALCIUM  9.2 02/27/2017 0952   CALCIUM  10.2 02/12/2015 1125   ALKPHOS 41 08/11/2023 0848   ALKPHOS 55 02/27/2017 0952   ALKPHOS 56 02/12/2015 1125   AST 22 08/11/2023 0848   AST 35 (H) 02/12/2015 1125   ALT 13 08/11/2023 0848   ALT 22 02/27/2017 0952   ALT 39 02/12/2015 1125   BILITOT 0.4 08/11/2023 0848   BILITOT 0.45 02/12/2015 1125       Impression and Plan: Ms. Butrum is 78 year old female with a history of stage I infiltrating duct carcinoma the left breast. She underwent lumpectomy.  She  had been on Femara . She had radiation. It has been 17 years now.  Everything looks good right now.  I do not see any problems from a malignant point of view.  I do not see any evidence of recurrent breast cancer.  Hopefully, she will do okay without having problems with balance.  We will still get her back in 1 year.  She gets her Zometa  whenever we see her back.     Ivor Mars, MD 6/6/20259:50 AM

## 2023-08-18 ENCOUNTER — Other Ambulatory Visit: Payer: Self-pay | Admitting: Hematology & Oncology

## 2023-08-18 DIAGNOSIS — Z853 Personal history of malignant neoplasm of breast: Secondary | ICD-10-CM

## 2023-08-23 ENCOUNTER — Ambulatory Visit: Attending: Cardiovascular Disease

## 2023-08-23 DIAGNOSIS — Z5181 Encounter for therapeutic drug level monitoring: Secondary | ICD-10-CM

## 2023-08-23 LAB — POCT INR: INR: 3 (ref 2.0–3.0)

## 2023-08-23 NOTE — Patient Instructions (Addendum)
 Description   Continue taking warfarin 1 tablet daily except 1/2 tablet on Monday, Wednesday, and Friday.  Recheck in 4 weeks  Coumadin  Clinic [phone]

## 2023-09-11 DIAGNOSIS — H2511 Age-related nuclear cataract, right eye: Secondary | ICD-10-CM | POA: Diagnosis not present

## 2023-09-12 DIAGNOSIS — H25042 Posterior subcapsular polar age-related cataract, left eye: Secondary | ICD-10-CM | POA: Diagnosis not present

## 2023-09-12 DIAGNOSIS — H2512 Age-related nuclear cataract, left eye: Secondary | ICD-10-CM | POA: Diagnosis not present

## 2023-09-12 DIAGNOSIS — H25012 Cortical age-related cataract, left eye: Secondary | ICD-10-CM | POA: Diagnosis not present

## 2023-09-17 ENCOUNTER — Other Ambulatory Visit: Payer: Self-pay | Admitting: Hematology & Oncology

## 2023-09-17 DIAGNOSIS — Z853 Personal history of malignant neoplasm of breast: Secondary | ICD-10-CM

## 2023-09-18 ENCOUNTER — Encounter: Payer: Self-pay | Admitting: Family

## 2023-09-20 ENCOUNTER — Ambulatory Visit: Attending: Cardiovascular Disease

## 2023-09-20 DIAGNOSIS — Z5181 Encounter for therapeutic drug level monitoring: Secondary | ICD-10-CM | POA: Diagnosis not present

## 2023-09-20 LAB — POCT INR: INR: 3 (ref 2.0–3.0)

## 2023-09-20 NOTE — Patient Instructions (Signed)
 Description   START taking warfarin 1 tablet daily except 1/2 tablet on Monday, Wednesday, Friday, and Saturday Recheck in 2 weeks  Coumadin  Clinic [phone]

## 2023-09-20 NOTE — Progress Notes (Signed)
Please see anticoagulation encounter.

## 2023-09-22 ENCOUNTER — Ambulatory Visit
Admission: RE | Admit: 2023-09-22 | Discharge: 2023-09-22 | Disposition: A | Discharge status: Home / Self Care | Source: Ambulatory Visit | Attending: Family Medicine | Admitting: Family Medicine

## 2023-09-22 DIAGNOSIS — Z1231 Encounter for screening mammogram for malignant neoplasm of breast: Secondary | ICD-10-CM | POA: Diagnosis not present

## 2023-09-22 DIAGNOSIS — D225 Melanocytic nevi of trunk: Secondary | ICD-10-CM | POA: Diagnosis not present

## 2023-09-22 DIAGNOSIS — L820 Inflamed seborrheic keratosis: Secondary | ICD-10-CM | POA: Diagnosis not present

## 2023-09-22 DIAGNOSIS — Z1283 Encounter for screening for malignant neoplasm of skin: Secondary | ICD-10-CM | POA: Diagnosis not present

## 2023-09-25 DIAGNOSIS — H25012 Cortical age-related cataract, left eye: Secondary | ICD-10-CM | POA: Diagnosis not present

## 2023-09-25 DIAGNOSIS — H2512 Age-related nuclear cataract, left eye: Secondary | ICD-10-CM | POA: Diagnosis not present

## 2023-09-25 DIAGNOSIS — H25042 Posterior subcapsular polar age-related cataract, left eye: Secondary | ICD-10-CM | POA: Diagnosis not present

## 2023-10-03 ENCOUNTER — Telehealth: Payer: Self-pay | Admitting: Cardiology

## 2023-10-03 DIAGNOSIS — I5042 Chronic combined systolic (congestive) and diastolic (congestive) heart failure: Secondary | ICD-10-CM

## 2023-10-03 DIAGNOSIS — E785 Hyperlipidemia, unspecified: Secondary | ICD-10-CM

## 2023-10-03 DIAGNOSIS — Z7901 Long term (current) use of anticoagulants: Secondary | ICD-10-CM

## 2023-10-03 DIAGNOSIS — I2510 Atherosclerotic heart disease of native coronary artery without angina pectoris: Secondary | ICD-10-CM

## 2023-10-03 MED ORDER — EMPAGLIFLOZIN 10 MG PO TABS: 10.0000 mg | ORAL_TABLET | Freq: Every day | ORAL | 2 refills | Status: AC

## 2023-10-03 MED ORDER — SACUBITRIL-VALSARTAN 24-26 MG PO TABS: 1.0000 | ORAL_TABLET | Freq: Two times a day (BID) | ORAL | 2 refills | Status: AC

## 2023-10-03 NOTE — Telephone Encounter (Signed)
*  STAT* If patient is at the pharmacy, call can be transferred to refill team.   1. Which medications need to be refilled? (please list name of each medication and dose if known)   empagliflozin  (JARDIANCE ) 10 MG TABS tablet    sacubitril -valsartan  (ENTRESTO ) 24-26 MG    2. Which pharmacy/location (including street and city if local pharmacy) is medication to be sent to?  [id number] - [place], [place] - [address]      3. Do they need a 30 day or 90 day supply? 90 day

## 2023-10-04 ENCOUNTER — Ambulatory Visit: Attending: Cardiovascular Disease | Admitting: *Deleted

## 2023-10-04 DIAGNOSIS — Z5181 Encounter for therapeutic drug level monitoring: Secondary | ICD-10-CM

## 2023-10-04 DIAGNOSIS — Z952 Presence of prosthetic heart valve: Secondary | ICD-10-CM

## 2023-10-04 DIAGNOSIS — H6123 Impacted cerumen, bilateral: Secondary | ICD-10-CM | POA: Diagnosis not present

## 2023-10-04 LAB — POCT INR: INR: 3 (ref 2.0–3.0)

## 2023-10-04 NOTE — Patient Instructions (Signed)
 Description   START taking warfarin 1/2 tablet daily except 1 tablet on Sunday and Thursday.  Recheck in 2 weeks. Coumadin  Clinic [phone]

## 2023-10-04 NOTE — Progress Notes (Signed)
 INR 3.0; Please see anticoagulation encounter

## 2023-10-18 ENCOUNTER — Ambulatory Visit: Attending: Cardiovascular Disease

## 2023-10-18 ENCOUNTER — Other Ambulatory Visit: Payer: Self-pay | Admitting: Hematology & Oncology

## 2023-10-18 DIAGNOSIS — Z853 Personal history of malignant neoplasm of breast: Secondary | ICD-10-CM

## 2023-10-18 DIAGNOSIS — Z5181 Encounter for therapeutic drug level monitoring: Secondary | ICD-10-CM | POA: Diagnosis not present

## 2023-10-18 LAB — POCT INR: INR: 2.5 (ref 2.0–3.0)

## 2023-10-18 NOTE — Patient Instructions (Signed)
 Description   Continue taking warfarin 1/2 tablet daily except 1 tablet on Sunday and Thursday.  Recheck in 4 weeks.  Coumadin  Clinic [phone]

## 2023-10-18 NOTE — Progress Notes (Signed)
 INR 2.5. Please see anticoagulation encounter

## 2023-11-14 ENCOUNTER — Ambulatory Visit: Attending: Cardiology | Admitting: *Deleted

## 2023-11-14 DIAGNOSIS — Z5181 Encounter for therapeutic drug level monitoring: Secondary | ICD-10-CM | POA: Diagnosis not present

## 2023-11-14 DIAGNOSIS — Z952 Presence of prosthetic heart valve: Secondary | ICD-10-CM | POA: Diagnosis not present

## 2023-11-14 DIAGNOSIS — H903 Sensorineural hearing loss, bilateral: Secondary | ICD-10-CM | POA: Diagnosis not present

## 2023-11-14 LAB — POCT INR: INR: 2 (ref 2.0–3.0)

## 2023-11-14 NOTE — Progress Notes (Signed)
 Description   INR-2.0 Today take 1 tablet of warfarin then continue taking warfarin 1/2 tablet daily except 1 tablet on Sunday and Thursday.  Recheck in 2 weeks.  Coumadin  Clinic [phone]

## 2023-11-14 NOTE — Patient Instructions (Signed)
 Description   INR-2.0 Today take 1 tablet of warfarin then continue taking warfarin 1/2 tablet daily except 1 tablet on Sunday and Thursday.  Recheck in 2 weeks.  Coumadin  Clinic [phone]

## 2023-11-16 ENCOUNTER — Other Ambulatory Visit: Payer: Self-pay | Admitting: Hematology & Oncology

## 2023-11-16 DIAGNOSIS — Z853 Personal history of malignant neoplasm of breast: Secondary | ICD-10-CM

## 2023-11-17 ENCOUNTER — Encounter: Payer: Self-pay | Admitting: Family

## 2023-11-29 ENCOUNTER — Ambulatory Visit: Attending: Cardiology | Admitting: *Deleted

## 2023-11-29 DIAGNOSIS — Z5181 Encounter for therapeutic drug level monitoring: Secondary | ICD-10-CM | POA: Diagnosis not present

## 2023-11-29 DIAGNOSIS — Z952 Presence of prosthetic heart valve: Secondary | ICD-10-CM | POA: Diagnosis not present

## 2023-11-29 LAB — POCT INR: INR: 2.3 (ref 2.0–3.0)

## 2023-11-29 NOTE — Progress Notes (Signed)
 Description   INR-2.3; Continue taking warfarin 1/2 tablet daily except 1 tablet on Sunday and Thursday. Recheck in 3 weeks. Coumadin  Clinic [phone]

## 2023-11-29 NOTE — Patient Instructions (Signed)
 Description   INR-2.3; Continue taking warfarin 1/2 tablet daily except 1 tablet on Sunday and Thursday. Recheck in 3 weeks. Coumadin  Clinic [phone]

## 2023-11-30 ENCOUNTER — Encounter: Admitting: Family Medicine

## 2023-12-01 DIAGNOSIS — M25512 Pain in left shoulder: Secondary | ICD-10-CM | POA: Diagnosis not present

## 2023-12-01 DIAGNOSIS — M542 Cervicalgia: Secondary | ICD-10-CM | POA: Diagnosis not present

## 2023-12-04 ENCOUNTER — Telehealth: Payer: Self-pay | Admitting: Pharmacist

## 2023-12-04 ENCOUNTER — Ambulatory Visit: Admitting: Family Medicine

## 2023-12-04 ENCOUNTER — Encounter: Payer: Self-pay | Admitting: Family Medicine

## 2023-12-04 VITALS — BP 126/74 | HR 63 | Temp 98.7°F | Ht 61.0 in | Wt 126.4 lb

## 2023-12-04 DIAGNOSIS — Z0000 Encounter for general adult medical examination without abnormal findings: Secondary | ICD-10-CM | POA: Diagnosis not present

## 2023-12-04 DIAGNOSIS — I5042 Chronic combined systolic (congestive) and diastolic (congestive) heart failure: Secondary | ICD-10-CM | POA: Diagnosis not present

## 2023-12-04 DIAGNOSIS — E785 Hyperlipidemia, unspecified: Secondary | ICD-10-CM | POA: Diagnosis not present

## 2023-12-04 DIAGNOSIS — R1310 Dysphagia, unspecified: Secondary | ICD-10-CM | POA: Diagnosis not present

## 2023-12-04 DIAGNOSIS — E119 Type 2 diabetes mellitus without complications: Secondary | ICD-10-CM | POA: Diagnosis not present

## 2023-12-04 DIAGNOSIS — Z952 Presence of prosthetic heart valve: Secondary | ICD-10-CM

## 2023-12-04 DIAGNOSIS — I110 Hypertensive heart disease with heart failure: Secondary | ICD-10-CM

## 2023-12-04 DIAGNOSIS — I10 Essential (primary) hypertension: Secondary | ICD-10-CM

## 2023-12-04 DIAGNOSIS — Z780 Asymptomatic menopausal state: Secondary | ICD-10-CM

## 2023-12-04 DIAGNOSIS — Z8719 Personal history of other diseases of the digestive system: Secondary | ICD-10-CM | POA: Diagnosis not present

## 2023-12-04 DIAGNOSIS — K219 Gastro-esophageal reflux disease without esophagitis: Secondary | ICD-10-CM

## 2023-12-04 DIAGNOSIS — Z23 Encounter for immunization: Secondary | ICD-10-CM

## 2023-12-04 DIAGNOSIS — E118 Type 2 diabetes mellitus with unspecified complications: Secondary | ICD-10-CM

## 2023-12-04 LAB — T4, FREE: Free T4: 0.84 ng/dL (ref 0.60–1.60)

## 2023-12-04 NOTE — Patient Instructions (Signed)
 An order for a bone density scan was placed as it was last done in 2022.  A referral to GI was also placed.  You should receive phone calls about setting up these appointments.

## 2023-12-04 NOTE — Progress Notes (Signed)
 Established Patient Office Visit   Subjective  Patient ID: Amanda Wiggins, female    DOB: 1945/04/14  Age: 78 y.o. MRN: [id number]  Chief Complaint  Patient presents with   Annual Exam    Heart burn     Pt is a 78 yo female seen for CPE and ongoing concerns.  Patient states she is doing well overall.  Followed by Ortho for left shoulder pain thought 2/2 history of rotator cuff tear.  Receiving steroid injections and p.o. steroids as needed.  Trying to avoid surgery as lives alone.  Advised against PT.  Had b/l cataract removal in July.  Eating more fruit.  Noticing more GI symptoms.  Taking protonix .  Food feels like it is going down more slowly.  Has h/o hiatal hernia.  Pt seen by audiology 2 wks ago, an anchor was attached to hearing aid and has improved pt's hearing.  Pt would like to have pap done next yr. Has h/o endometrial cancer and L breast ca.  Was seeing OB/Gyn.  Seen by Derm for removal of various skin lesions.  Also followed by Cardiology, has yet to meet new provider since Amanda Wiggins retirement.    Patient Active Problem List   Diagnosis Date Noted   Controlled type 2 diabetes mellitus with complication, without long-term current use of insulin (HCC) 06/12/2023   Pure hypertriglyceridemia 06/12/2023   Breast cancer (HCC) 05/31/2023   Degenerative arthritis of finger 05/31/2023   Headache 05/31/2023   Uterine cancer (HCC) 05/31/2023   Epistaxis 03/31/2023   Statin intolerance 02/06/2022   Sensorineural hearing loss (SNHL), bilateral 01/03/2022   Osteopenia 11/06/2020   Acute on chronic combined systolic and diastolic CHF (congestive heart failure) (HCC) 11/07/2019   Trigger finger of left thumb 08/14/2019   Anticoagulated by anticoagulation treatment 10/30/2018   Bilateral impacted cerumen 03/16/2017   Lightheadedness 03/16/2017   Chronic combined systolic and diastolic CHF (congestive heart failure) (HCC) 05/23/2015   CAD S/P urgent PCI 05/16/15 05/19/2015    Cardiomyopathy, ischemic 05/19/2015   ST elevation (STEMI) myocardial infarction involving left anterior descending coronary artery (HCC)    Acute ST elevation myocardial infarction (STEMI) of anterolateral wall (HCC) 05/16/2015   Encounter for therapeutic drug monitoring 07/19/2013   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 08/22/2011   Aortic valve replaced 01/25/2011   Long term current use of anticoagulant therapy 06/25/2010   INFECTIOUS DIARRHEA 03/20/2008   DIARRHEA-PRESUMED INFECTIOUS 03/20/2008   URTICARIA 03/13/2008   FATIGUE 09/12/2007   DIARRHEA 08/29/2007   ABDOMINAL PAIN, GENERALIZED, CHRONIC 08/29/2007   Allergic rhinitis 05/09/2007   HIATAL HERNIA 05/09/2007   ARTHRITIS 05/09/2007   OSTEOPENIA 05/09/2007   GERD 01/23/2007   CANCER, ENDOMETRIUM 07/25/2006   Dyslipidemia 07/25/2006   COMMON MIGRAINE 07/25/2006   Essential hypertension 07/25/2006   Hx Breast Cancer, IDC, Stage I 07/03/2003   Endometrial carcinoma (HCC) 03/08/1999   Past Medical History:  Diagnosis Date   Allergy 1980   Aortic valve replaced    Arthritis Thumbs   Breast cancer (HCC) 2005   CHF (congestive heart failure) (HCC) 2017   FHx: migraine headaches    GERD (gastroesophageal reflux disease)    Hiatal hernia    Hx Breast Cancer, IDC, Stage I 07/03/2003   Hx of breast cancer    Hyperlipidemia    Hypertension    Menopausal syndrome    Myocardial infarction (HCC) 2017   Osteopenia    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 08/22/2011   Personal history of radiation  therapy    Status post aortic valve repair    Past Surgical History:  Procedure Laterality Date   ABDOMINAL HYSTERECTOMY  May 2001   2001   AORTIC VALVE REPLACEMENT  2003   BREAST BIOPSY Left 2005   BREAST LUMPECTOMY Left 2005   CARDIAC CATHETERIZATION     Ejection Fraction    CARDIAC CATHETERIZATION N/A 05/16/2015   Procedure: Left Heart Cath and Coronary Angiography;  Surgeon: Alm LELON Clay, MD;  Location: [facility]  INVASIVE CV LAB;  Service: Cardiovascular;  Laterality: N/A;   CARDIAC CATHETERIZATION  05/16/2015   Procedure: Coronary Stent Intervention;  Surgeon: Alm LELON Clay, MD;  Location: [facility] INVASIVE CV LAB;  Service: Cardiovascular;;   CARDIAC CATHETERIZATION N/A 05/19/2015   Procedure: Left Heart Cath and Coronary Angiography;  Surgeon: Debby DELENA Sor, MD;  Location[facility] INVASIVE CV LAB;  Service: Cardiovascular;  Laterality: N/A;   CARDIAC CATHETERIZATION N/A 05/19/2015   Procedure: Coronary Stent Intervention;  Surgeon: Debby DELENA Sor, MD;  Location: [facility] INVASIVE CV LAB;  Service: Cardiovascular;  Laterality: N/A;   CARDIAC VALVE REPLACEMENT  October 2003   2003   EYE SURGERY  2025   Cataracts   HERNIA REPAIR  March 2009   2009   HIATAL HERNIA REPAIR     HIATAL HERNIA REPAIR  05/11/2007   hysterectomy, endometiral cancer  2001   lummpectomy breast cancer  07/03/2003   nissan fundoplicaiton  2009   post-op hematoma on heparin     right shouler surgery     Social History   Tobacco Use   Smoking status: Never   Smokeless tobacco: Never  Vaping Use   Vaping status: Never Used  Substance Use Topics   Alcohol use: Yes    Alcohol/week: 1.0 standard drink of alcohol    Types: 1 Glasses of wine per week    Comment: Very occasional   Drug use: No   Family History  Problem Relation Age of Onset   Heart attack Mother 23   Heart disease Mother    Hyperlipidemia Mother    Hypertension Mother    Osteoarthritis Brother        hpercholesterolemia   Hearing loss Father    Allergies  Allergen Reactions   Latex Hives   Meperidine Nausea Only, Nausea And Vomiting and Other (See Comments)    Unknown reaction   Meperidine Hcl Nausea And Vomiting and Nausea Only   Sulfa Antibiotics Nausea Only and Nausea And Vomiting   Crestor  [Rosuvastatin  Calcium ] Other (See Comments)    Myalgias on 10mg  and 20mg  daily   Pravastatin  Other (See Comments)    myalgias   Sulfamethoxazole Nausea And Vomiting    Repatha  [Evolocumab ] Other (See Comments)    myalgias    ROS Negative unless stated above    Objective:     BP 126/74 (BP Location: Left Arm, Patient Position: Sitting, Cuff Size: Normal)   Pulse 63   Temp 98.7 F (37.1 C) (Oral)   Ht 5' 1 (1.549 m)   Wt 126 lb 6.4 oz (57.3 kg)   SpO2 94%   BMI 23.88 kg/m  BP Readings from Last 3 Encounters:  12/04/23 126/74  08/11/23 (!) 86/52  08/11/23 (!) 84/48   Wt Readings from Last 3 Encounters:  12/04/23 126 lb 6.4 oz (57.3 kg)  08/11/23 123 lb 1.9 oz (55.8 kg)  06/06/23 121 lb 12.8 oz (55.2 kg)      Physical Exam Constitutional:      Appearance: Normal appearance.  HENT:     Head: Normocephalic and atraumatic.     Comments: Hearing aids in place.    Right Ear: External ear normal.     Left Ear: External ear normal.     Nose: Nose normal.     Mouth/Throat:     Mouth: Mucous membranes are moist.     Pharynx: No oropharyngeal exudate or posterior oropharyngeal erythema.  Eyes:     General: No scleral icterus.    Extraocular Movements: Extraocular movements intact.     Conjunctiva/sclera: Conjunctivae normal.     Pupils: Pupils are equal, round, and reactive to light.  Neck:     Thyroid : No thyromegaly.     Vascular: No carotid bruit.  Cardiovascular:     Rate and Rhythm: Normal rate and regular rhythm.     Pulses: Normal pulses.     Heart sounds: Normal heart sounds. No murmur heard.    No friction rub.  Pulmonary:     Effort: Pulmonary effort is normal.     Breath sounds: Normal breath sounds. No wheezing, rhonchi or rales.  Abdominal:     General: Bowel sounds are normal.     Palpations: Abdomen is soft.     Tenderness: There is no abdominal tenderness.  Musculoskeletal:        General: No deformity. Normal range of motion.  Lymphadenopathy:     Cervical: No cervical adenopathy.  Skin:    General: Skin is warm and dry.     Findings: No lesion.  Neurological:     General: No focal deficit present.      Mental Status: She is alert and oriented to person, place, and time.  Psychiatric:        Mood and Affect: Mood normal.        Thought Content: Thought content normal.        12/04/2023    9:20 AM 12/01/2022    4:56 PM 05/30/2022    9:11 AM  Depression screen PHQ 2/9  Decreased Interest 0 0 0  Down, Depressed, Hopeless 0 0 0  PHQ - 2 Score 0 0 0  Altered sleeping 0 0 0  Tired, decreased energy 1 0 1  Change in appetite 0 0 0  Feeling bad or failure about yourself  0 0 0  Trouble concentrating 0 0 0  Moving slowly or fidgety/restless 0 0 0  Suicidal thoughts 0 0 0  PHQ-9 Score 1 0 1  Difficult doing work/chores Not difficult at all Not difficult at all Not difficult at all      12/04/2023    9:20 AM 12/01/2022    4:57 PM 05/30/2022    9:11 AM 11/05/2020    8:36 AM  GAD 7 : Generalized Anxiety Score  Nervous, Anxious, on Edge 0 0 0 0  Control/stop worrying 0 0 0 0  Worry too much - different things 0 0 0 0  Trouble relaxing 0 0 0 0  Restless 0 0 0 0  Easily annoyed or irritable 0 0 0 0  Afraid - awful might happen 0 0 0 0  Total GAD 7 Score 0 0 0 0  Anxiety Difficulty  Not difficult at all       No results found for any visits on 12/04/23.    Assessment & Plan:   Well adult exam -     CBC with Differential/Platelet; Future -     Comprehensive metabolic panel with GFR; Future -  Hemoglobin A1c; Future -     Lipid panel; Future -     T4, free; Future -     TSH; Future  Controlled type 2 diabetes mellitus with complication, without long-term current use of insulin (HCC) -     Hemoglobin A1c; Future  Essential hypertension -     Comprehensive metabolic panel with GFR; Future -     T4, free; Future -     TSH; Future  Dyslipidemia -     Lipid panel; Future  Need for influenza vaccination -     Flu vaccine HIGH DOSE PF(Fluzone Trivalent); Future  Dysphagia, unspecified type -     Ambulatory referral to Gastroenterology  Gastroesophageal reflux disease,  unspecified whether esophagitis present -     Ambulatory referral to Gastroenterology  History of hiatal hernia -     Ambulatory referral to Gastroenterology  Chronic combined systolic and diastolic CHF (congestive heart failure) (HCC)  S/P aortic valve replacement   Age-appropriate health screenings discussed.  Obtain labs.  Immunizations reviewed.  Influenza vaccine given this visit.  Mammogram done 09/22/2023.  Colonoscopy done 08/26/2016.  Patient with history of endometrial cancer status post hysterectomy.  Would like to have pelvic exam next year.  Previously seen by OB/GYN.  Bone density scan due.  Euvolemic on exam.  Continue current meds including coumadin  and plavix .  F/u with Cards.  Referral to GI for dysphagia.  Decrease intake of fruit as may elevated A1C.  Return in about 6 months (around 06/02/2024), or if symptoms worsen or fail to improve, for diabetes, chronic conditions.   Amanda JONELLE Single, MD

## 2023-12-04 NOTE — Telephone Encounter (Signed)
 Patient called to report she went to orthopedic appt on Friday and was started on a short course of prednisone. Advised to eat extra greens this week. Next INR check in 3 weeks.

## 2023-12-05 LAB — CBC WITH DIFFERENTIAL/PLATELET
Absolute Lymphocytes: 1407 {cells}/uL (ref 850–3900)
Absolute Monocytes: 737 {cells}/uL (ref 200–950)
Basophils Absolute: 19 {cells}/uL (ref 0–200)
Basophils Relative: 0.2 %
Eosinophils Absolute: 0 {cells}/uL — ABNORMAL LOW (ref 15–500)
Eosinophils Relative: 0 %
HCT: 42.4 % (ref 35.0–45.0)
Hemoglobin: 13.9 g/dL (ref 11.7–15.5)
MCH: 28.9 pg (ref 27.0–33.0)
MCHC: 32.8 g/dL (ref 32.0–36.0)
MCV: 88.1 fL (ref 80.0–100.0)
MPV: 10.5 fL (ref 7.5–12.5)
Monocytes Relative: 7.6 %
Neutro Abs: 7537 {cells}/uL (ref 1500–7800)
Neutrophils Relative %: 77.7 %
Platelets: 374 Thousand/uL (ref 140–400)
RBC: 4.81 Million/uL (ref 3.80–5.10)
RDW: 13.1 % (ref 11.0–15.0)
Total Lymphocyte: 14.5 %
WBC: 9.7 Thousand/uL (ref 3.8–10.8)

## 2023-12-05 LAB — HEMOGLOBIN A1C
Hgb A1c MFr Bld: 6.3 % — ABNORMAL HIGH (ref <5.7)
Mean Plasma Glucose: 134 mg/dL
eAG (mmol/L): 7.4 mmol/L

## 2023-12-05 LAB — COMPREHENSIVE METABOLIC PANEL WITH GFR
AG Ratio: 2.2 (calc) (ref 1.0–2.5)
ALT: 18 U/L (ref 6–29)
AST: 22 U/L (ref 10–35)
Albumin: 4.7 g/dL (ref 3.6–5.1)
Alkaline phosphatase (APISO): 39 U/L (ref 37–153)
BUN/Creatinine Ratio: 38 (calc) — ABNORMAL HIGH (ref 6–22)
BUN: 30 mg/dL — ABNORMAL HIGH (ref 7–25)
CO2: 29 mmol/L (ref 20–32)
Calcium: 10.1 mg/dL (ref 8.6–10.4)
Chloride: 101 mmol/L (ref 98–110)
Creat: 0.79 mg/dL (ref 0.60–1.00)
Globulin: 2.1 g/dL (ref 1.9–3.7)
Glucose, Bld: 103 mg/dL — ABNORMAL HIGH (ref 65–99)
Potassium: 4.6 mmol/L (ref 3.5–5.3)
Sodium: 137 mmol/L (ref 135–146)
Total Bilirubin: 0.5 mg/dL (ref 0.2–1.2)
Total Protein: 6.8 g/dL (ref 6.1–8.1)
eGFR: 77 mL/min/1.73m2 (ref >=60)

## 2023-12-05 LAB — LIPID PANEL
Cholesterol: 173 mg/dL (ref <200)
HDL: 68 mg/dL (ref >=50)
LDL Cholesterol (Calc): 78 mg/dL
Non-HDL Cholesterol (Calc): 105 mg/dL (ref <130)
Total CHOL/HDL Ratio: 2.5 (calc) (ref <5.0)
Triglycerides: 174 mg/dL — ABNORMAL HIGH (ref <150)

## 2023-12-05 LAB — TSH: TSH: 1.12 m[IU]/L (ref 0.40–4.50)

## 2023-12-13 ENCOUNTER — Ambulatory Visit: Payer: Self-pay | Admitting: Family Medicine

## 2023-12-17 ENCOUNTER — Other Ambulatory Visit: Payer: Self-pay | Admitting: Hematology & Oncology

## 2023-12-17 DIAGNOSIS — Z853 Personal history of malignant neoplasm of breast: Secondary | ICD-10-CM

## 2023-12-18 ENCOUNTER — Encounter: Payer: Self-pay | Admitting: Family

## 2023-12-20 ENCOUNTER — Other Ambulatory Visit: Payer: Self-pay | Admitting: Pharmacist

## 2023-12-20 ENCOUNTER — Ambulatory Visit: Attending: Cardiovascular Disease | Admitting: *Deleted

## 2023-12-20 DIAGNOSIS — Z952 Presence of prosthetic heart valve: Secondary | ICD-10-CM

## 2023-12-20 DIAGNOSIS — E785 Hyperlipidemia, unspecified: Secondary | ICD-10-CM

## 2023-12-20 DIAGNOSIS — I2510 Atherosclerotic heart disease of native coronary artery without angina pectoris: Secondary | ICD-10-CM

## 2023-12-20 DIAGNOSIS — Z5181 Encounter for therapeutic drug level monitoring: Secondary | ICD-10-CM | POA: Diagnosis not present

## 2023-12-20 LAB — POCT INR: INR: 1.9 — AB (ref 2.0–3.0)

## 2023-12-20 MED ORDER — PRALUENT 75 MG/ML ~~LOC~~ SOAJ: 75.0000 mg | SUBCUTANEOUS | 3 refills | Status: AC

## 2023-12-20 NOTE — Patient Instructions (Signed)
 Description   INR-1.9; Today take 1 tablet of warfarin then continue taking warfarin 1/2 tablet daily except 1 tablet on Sunday and Thursday. Recheck in 2 weeks. Coumadin  Clinic [phone]

## 2023-12-20 NOTE — Progress Notes (Signed)
 Description   INR-1.9; Today take 1 tablet of warfarin then continue taking warfarin 1/2 tablet daily except 1 tablet on Sunday and Thursday. Recheck in 2 weeks. Coumadin  Clinic [phone]

## 2024-01-01 ENCOUNTER — Ambulatory Visit: Attending: Cardiology | Admitting: *Deleted

## 2024-01-01 DIAGNOSIS — M25512 Pain in left shoulder: Secondary | ICD-10-CM | POA: Diagnosis not present

## 2024-01-01 DIAGNOSIS — Z5181 Encounter for therapeutic drug level monitoring: Secondary | ICD-10-CM | POA: Diagnosis not present

## 2024-01-01 DIAGNOSIS — G8929 Other chronic pain: Secondary | ICD-10-CM | POA: Diagnosis not present

## 2024-01-01 DIAGNOSIS — Z952 Presence of prosthetic heart valve: Secondary | ICD-10-CM

## 2024-01-01 LAB — POCT INR: INR: 1.4 — AB (ref 2.0–3.0)

## 2024-01-01 NOTE — Patient Instructions (Signed)
 Description   INR-1.4; Today take 1 tablet of warfarin and tomorrow take 1 tablet of warfarin  then START taking warfarin 1/2 tablet daily except 1 tablet on Sunday, Tuesday, and Thursday. Recheck in 1 week. Coumadin  Clinic [phone]

## 2024-01-01 NOTE — Progress Notes (Signed)
 Description   INR-1.4; Today take 1 tablet of warfarin and tomorrow take 1 tablet of warfarin  then START taking warfarin 1/2 tablet daily except 1 tablet on Sunday, Tuesday, and Thursday. Recheck in 1 week. Coumadin  Clinic [phone]

## 2024-01-04 DIAGNOSIS — H6123 Impacted cerumen, bilateral: Secondary | ICD-10-CM | POA: Diagnosis not present

## 2024-01-09 ENCOUNTER — Ambulatory Visit: Attending: Cardiology

## 2024-01-09 DIAGNOSIS — Z952 Presence of prosthetic heart valve: Secondary | ICD-10-CM | POA: Diagnosis not present

## 2024-01-09 DIAGNOSIS — Z5181 Encounter for therapeutic drug level monitoring: Secondary | ICD-10-CM

## 2024-01-09 LAB — POCT INR: INR: 1.4 — AB (ref 2.0–3.0)

## 2024-01-09 NOTE — Progress Notes (Signed)
 Description   INR-1.4; Today take 1.5 tablets and take 1 tablet tomorrow, then start taking warfarin 1 tablet daily except 1/2 tablet on Mondays, Wednesdays and Fridays.  Recheck in 1 week. Coumadin  Clinic [phone]

## 2024-01-09 NOTE — Patient Instructions (Signed)
 Description   INR-1.4; Today take 1.5 tablets and take 1 tablet tomorrow, then start taking warfarin 1 tablet daily except 1/2 tablet on Mondays, Wednesdays and Fridays.  Recheck in 1 week. Coumadin  Clinic [phone]

## 2024-01-15 ENCOUNTER — Other Ambulatory Visit: Payer: Self-pay | Admitting: Hematology & Oncology

## 2024-01-15 DIAGNOSIS — Z853 Personal history of malignant neoplasm of breast: Secondary | ICD-10-CM

## 2024-01-16 ENCOUNTER — Other Ambulatory Visit: Payer: Self-pay | Admitting: Cardiology

## 2024-01-16 ENCOUNTER — Encounter: Payer: Self-pay | Admitting: Family

## 2024-01-16 DIAGNOSIS — Z952 Presence of prosthetic heart valve: Secondary | ICD-10-CM

## 2024-01-16 DIAGNOSIS — K219 Gastro-esophageal reflux disease without esophagitis: Secondary | ICD-10-CM

## 2024-01-16 DIAGNOSIS — Z7901 Long term (current) use of anticoagulants: Secondary | ICD-10-CM

## 2024-01-16 NOTE — Telephone Encounter (Signed)
 Warfarin 5mg  refill  AV Replaced Last INR 01/09/24 Last OV 06/06/23-Nahser and recall for next year with Dr. Kate

## 2024-01-17 ENCOUNTER — Ambulatory Visit: Attending: Student in an Organized Health Care Education/Training Program | Admitting: *Deleted

## 2024-01-17 ENCOUNTER — Ambulatory Visit

## 2024-01-17 DIAGNOSIS — Z5181 Encounter for therapeutic drug level monitoring: Secondary | ICD-10-CM | POA: Diagnosis not present

## 2024-01-17 DIAGNOSIS — Z952 Presence of prosthetic heart valve: Secondary | ICD-10-CM | POA: Diagnosis not present

## 2024-01-17 LAB — POCT INR: INR: 2.2 (ref 2.0–3.0)

## 2024-01-17 NOTE — Progress Notes (Signed)
 Description   INR-2.2; Continue taking warfarin 1 tablet daily except 1/2 tablet on Mondays, Wednesdays and Fridays.  Recheck in 1 week. Coumadin  Clinic [phone]

## 2024-01-17 NOTE — Patient Instructions (Signed)
 Description   INR-2.2; Continue taking warfarin 1 tablet daily except 1/2 tablet on Mondays, Wednesdays and Fridays.  Recheck in 1 week. Coumadin  Clinic [phone]

## 2024-01-24 ENCOUNTER — Ambulatory Visit: Attending: Cardiovascular Disease | Admitting: *Deleted

## 2024-01-24 DIAGNOSIS — Z952 Presence of prosthetic heart valve: Secondary | ICD-10-CM | POA: Diagnosis not present

## 2024-01-24 DIAGNOSIS — Z5181 Encounter for therapeutic drug level monitoring: Secondary | ICD-10-CM

## 2024-01-24 LAB — POCT INR: INR: 2.8 (ref 2.0–3.0)

## 2024-01-24 NOTE — Progress Notes (Signed)
 Description   INR-2.8; Continue taking warfarin 1 tablet daily except 1/2 tablet on Mondays, Wednesdays and Fridays. Recheck in 12 days. Coumadin  Clinic [phone]

## 2024-01-24 NOTE — Patient Instructions (Signed)
 Description   INR-2.8; Continue taking warfarin 1 tablet daily except 1/2 tablet on Mondays, Wednesdays and Fridays. Recheck in 12 days. Coumadin  Clinic [phone]

## 2024-02-05 ENCOUNTER — Ambulatory Visit

## 2024-02-06 ENCOUNTER — Ambulatory Visit: Attending: Cardiology | Admitting: *Deleted

## 2024-02-06 DIAGNOSIS — Z952 Presence of prosthetic heart valve: Secondary | ICD-10-CM | POA: Diagnosis not present

## 2024-02-06 DIAGNOSIS — Z5181 Encounter for therapeutic drug level monitoring: Secondary | ICD-10-CM | POA: Diagnosis not present

## 2024-02-06 LAB — POCT INR: INR: 2.4 (ref 2.0–3.0)

## 2024-02-06 NOTE — Patient Instructions (Signed)
 Description   INR-2.4; Continue taking warfarin 1 tablet daily except 1/2 tablet on Mondays, Wednesdays and Fridays. Recheck in 2 weeks. Coumadin  Clinic [phone]

## 2024-02-06 NOTE — Progress Notes (Signed)
 Description   INR-2.4; Continue taking warfarin 1 tablet daily except 1/2 tablet on Mondays, Wednesdays and Fridays. Recheck in 2 weeks. Coumadin  Clinic [phone]

## 2024-02-14 ENCOUNTER — Other Ambulatory Visit: Payer: Self-pay | Admitting: Hematology & Oncology

## 2024-02-14 DIAGNOSIS — Z853 Personal history of malignant neoplasm of breast: Secondary | ICD-10-CM

## 2024-02-20 ENCOUNTER — Ambulatory Visit: Attending: Internal Medicine

## 2024-02-20 DIAGNOSIS — Z5181 Encounter for therapeutic drug level monitoring: Secondary | ICD-10-CM

## 2024-02-20 DIAGNOSIS — Z952 Presence of prosthetic heart valve: Secondary | ICD-10-CM

## 2024-02-20 LAB — POCT INR: INR: 2.3 (ref 2.0–3.0)

## 2024-02-20 NOTE — Patient Instructions (Signed)
 Description   INR-2.3; Continue taking warfarin 1 tablet daily except 1/2 tablet on Mondays, Wednesdays and Fridays. Recheck in 3 weeks. Coumadin  Clinic [phone]

## 2024-02-20 NOTE — Progress Notes (Signed)
 Description   INR-2.3; Continue taking warfarin 1 tablet daily except 1/2 tablet on Mondays, Wednesdays and Fridays. Recheck in 3 weeks. Coumadin  Clinic [phone]

## 2024-03-05 ENCOUNTER — Other Ambulatory Visit: Payer: Self-pay

## 2024-03-06 MED ORDER — CARVEDILOL 3.125 MG PO TABS: 3.1250 mg | ORAL_TABLET | Freq: Two times a day (BID) | ORAL | 0 refills | Status: AC

## 2024-03-06 MED ORDER — CLOPIDOGREL BISULFATE 75 MG PO TABS: 75.0000 mg | ORAL_TABLET | Freq: Every day | ORAL | 0 refills | Status: AC

## 2024-03-12 ENCOUNTER — Ambulatory Visit

## 2024-03-12 DIAGNOSIS — Z952 Presence of prosthetic heart valve: Secondary | ICD-10-CM | POA: Diagnosis not present

## 2024-03-12 DIAGNOSIS — Z5181 Encounter for therapeutic drug level monitoring: Secondary | ICD-10-CM | POA: Diagnosis not present

## 2024-03-12 LAB — POCT INR: INR: 2.8 (ref 2.0–3.0)

## 2024-03-12 NOTE — Progress Notes (Signed)
 Description   INR-2.8; Continue taking warfarin 1 tablet daily except 1/2 tablet on Mondays, Wednesdays and Fridays. Recheck in 3 weeks. Coumadin  Clinic [phone]

## 2024-03-12 NOTE — Patient Instructions (Signed)
 Description   INR-2.8; Continue taking warfarin 1 tablet daily except 1/2 tablet on Mondays, Wednesdays and Fridays. Recheck in 3 weeks. Coumadin  Clinic [phone]

## 2024-03-15 ENCOUNTER — Other Ambulatory Visit: Payer: Self-pay | Admitting: Hematology & Oncology

## 2024-03-15 DIAGNOSIS — Z853 Personal history of malignant neoplasm of breast: Secondary | ICD-10-CM

## 2024-03-16 ENCOUNTER — Encounter: Payer: Self-pay | Admitting: Family

## 2024-04-03 ENCOUNTER — Other Ambulatory Visit (HOSPITAL_COMMUNITY): Payer: Self-pay

## 2024-04-03 ENCOUNTER — Telehealth: Payer: Self-pay | Admitting: Pharmacy Technician

## 2024-04-03 ENCOUNTER — Encounter: Payer: Self-pay | Admitting: Family

## 2024-04-03 ENCOUNTER — Telehealth: Payer: Self-pay | Admitting: Pharmacist

## 2024-04-03 ENCOUNTER — Ambulatory Visit: Attending: Cardiology | Admitting: *Deleted

## 2024-04-03 DIAGNOSIS — Z5181 Encounter for therapeutic drug level monitoring: Secondary | ICD-10-CM | POA: Diagnosis not present

## 2024-04-03 DIAGNOSIS — Z952 Presence of prosthetic heart valve: Secondary | ICD-10-CM | POA: Diagnosis not present

## 2024-04-03 LAB — POCT INR: INR: 1.9 — AB (ref 2.0–3.0)

## 2024-04-03 NOTE — Telephone Encounter (Signed)
 Please verify Praluent  PA is current

## 2024-04-03 NOTE — Progress Notes (Signed)
 Description   INR-1.9; Today take 1 tablet of warfarin then continue taking warfarin 1 tablet daily except 1/2 tablet on Mondays, Wednesdays and Fridays. Recheck in 3 weeks. Coumadin  Clinic [phone]

## 2024-04-03 NOTE — Patient Instructions (Signed)
 Description   INR-1.9; Today take 1 tablet of warfarin then continue taking warfarin 1 tablet daily except 1/2 tablet on Mondays, Wednesdays and Fridays. Recheck in 2 weeks. Coumadin  Clinic [phone]

## 2024-04-03 NOTE — Telephone Encounter (Signed)
" ° °  Ran test claim for praluent . For a 84 day supply and the co-pay is 100.00 . PA is not needed at this time. This test claim was processed through [facility]- copay amounts may vary at other pharmacies due to pharmacy/plan contracts, or as the patient moves through the different stages of their insurance plan.    "

## 2024-04-04 ENCOUNTER — Ambulatory Visit

## 2024-04-09 ENCOUNTER — Telehealth: Payer: Self-pay | Admitting: Pharmacy Technician

## 2024-04-09 NOTE — Telephone Encounter (Signed)
" ° °  Pharmacy Patient Advocate Encounter   Received notification from [facility] that prior authorization for praluent  is required/requested.   Insurance verification completed.   The patient is insured through [employer].   Per pa:    "

## 2024-04-19 ENCOUNTER — Ambulatory Visit

## 2024-05-01 ENCOUNTER — Ambulatory Visit: Admitting: Internal Medicine

## 2024-05-31 ENCOUNTER — Ambulatory Visit: Admitting: Family Medicine

## 2024-06-19 ENCOUNTER — Ambulatory Visit: Admitting: Cardiology

## 2024-08-09 ENCOUNTER — Inpatient Hospital Stay

## 2024-08-09 ENCOUNTER — Ambulatory Visit: Admitting: Hematology & Oncology

## 2024-08-09 ENCOUNTER — Ambulatory Visit
